# Patient Record
Sex: Female | Born: 1975
Health system: Southern US, Community
[De-identification: ages and names within clinical notes are randomized; demographics above are authoritative.]

## PROBLEM LIST (undated history)

## (undated) DIAGNOSIS — R002 Palpitations: Secondary | ICD-10-CM

## (undated) DIAGNOSIS — F329 Major depressive disorder, single episode, unspecified: Secondary | ICD-10-CM

## (undated) DIAGNOSIS — G252 Other specified forms of tremor: Secondary | ICD-10-CM

## (undated) DIAGNOSIS — K2970 Gastritis, unspecified, without bleeding: Secondary | ICD-10-CM

## (undated) DIAGNOSIS — F3289 Other specified depressive episodes: Secondary | ICD-10-CM

## (undated) DIAGNOSIS — L0292 Furuncle, unspecified: Secondary | ICD-10-CM

## (undated) DIAGNOSIS — R7303 Prediabetes: Secondary | ICD-10-CM

## (undated) DIAGNOSIS — I10 Essential (primary) hypertension: Secondary | ICD-10-CM

## (undated) DIAGNOSIS — M2550 Pain in unspecified joint: Secondary | ICD-10-CM

## (undated) DIAGNOSIS — M549 Dorsalgia, unspecified: Secondary | ICD-10-CM

## (undated) DIAGNOSIS — F341 Dysthymic disorder: Secondary | ICD-10-CM

## (undated) DIAGNOSIS — K219 Gastro-esophageal reflux disease without esophagitis: Secondary | ICD-10-CM

## (undated) DIAGNOSIS — E669 Obesity, unspecified: Secondary | ICD-10-CM

## (undated) DIAGNOSIS — R0602 Shortness of breath: Secondary | ICD-10-CM

## (undated) DIAGNOSIS — M722 Plantar fascial fibromatosis: Secondary | ICD-10-CM

## (undated) DIAGNOSIS — F419 Anxiety disorder, unspecified: Secondary | ICD-10-CM

## (undated) DIAGNOSIS — Z91018 Allergy to other foods: Secondary | ICD-10-CM

## (undated) DIAGNOSIS — M069 Rheumatoid arthritis, unspecified: Secondary | ICD-10-CM

## (undated) DIAGNOSIS — K5900 Constipation, unspecified: Secondary | ICD-10-CM

## (undated) DIAGNOSIS — M5481 Occipital neuralgia: Secondary | ICD-10-CM

## (undated) DIAGNOSIS — J309 Allergic rhinitis, unspecified: Secondary | ICD-10-CM

## (undated) DIAGNOSIS — L409 Psoriasis, unspecified: Secondary | ICD-10-CM

## (undated) DIAGNOSIS — S0300XA Dislocation of jaw, unspecified side, initial encounter: Secondary | ICD-10-CM

## (undated) DIAGNOSIS — K602 Anal fissure, unspecified: Secondary | ICD-10-CM

## (undated) DIAGNOSIS — T7840XA Allergy, unspecified, initial encounter: Secondary | ICD-10-CM

## (undated) DIAGNOSIS — E785 Hyperlipidemia, unspecified: Secondary | ICD-10-CM

## (undated) DIAGNOSIS — E7800 Pure hypercholesterolemia, unspecified: Secondary | ICD-10-CM

## (undated) HISTORY — DX: Palpitations: R00.2

## (undated) HISTORY — DX: Dorsalgia, unspecified: M54.9

## (undated) HISTORY — DX: Constipation, unspecified: K59.00

## (undated) HISTORY — DX: Allergy, unspecified, initial encounter: T78.40XA

## (undated) HISTORY — DX: Occipital neuralgia: M54.81

## (undated) HISTORY — DX: Dysthymic disorder: F34.1

## (undated) HISTORY — DX: Gastro-esophageal reflux disease without esophagitis: K21.9

## (undated) HISTORY — DX: Allergic rhinitis, unspecified: J30.9

## (undated) HISTORY — DX: Essential (primary) hypertension: I10

## (undated) HISTORY — DX: Gastritis, unspecified, without bleeding: K29.70

## (undated) HISTORY — DX: Major depressive disorder, single episode, unspecified: F32.9

## (undated) HISTORY — DX: Shortness of breath: R06.02

## (undated) HISTORY — DX: Furuncle, unspecified: L02.92

## (undated) HISTORY — DX: Plantar fascial fibromatosis: M72.2

## (undated) HISTORY — DX: Dislocation of jaw, unspecified side, initial encounter: S03.00XA

## (undated) HISTORY — DX: Pure hypercholesterolemia, unspecified: E78.00

## (undated) HISTORY — DX: Pain in unspecified joint: M25.50

## (undated) HISTORY — DX: Rheumatoid arthritis, unspecified: M06.9

## (undated) HISTORY — DX: Allergy to other foods: Z91.018

## (undated) HISTORY — DX: Psoriasis, unspecified: L40.9

## (undated) HISTORY — DX: Other specified depressive episodes: F32.89

## (undated) HISTORY — DX: Obesity, unspecified: E66.9

## (undated) HISTORY — DX: Anxiety disorder, unspecified: F41.9

## (undated) HISTORY — DX: Prediabetes: R73.03

## (undated) HISTORY — DX: Anal fissure, unspecified: K60.2

## (undated) HISTORY — PX: NO PAST SURGERIES: SHX2092

## (undated) HISTORY — DX: Other specified forms of tremor: G25.2

## (undated) HISTORY — DX: Hyperlipidemia, unspecified: E78.5

---

## 2001-05-11 LAB — HM COLONOSCOPY: HM Colonoscopy: NORMAL

## 2002-08-05 ENCOUNTER — Other Ambulatory Visit: Admission: RE | Admit: 2002-08-05 | Discharge: 2002-08-05 | Payer: Self-pay | Admitting: Obstetrics and Gynecology

## 2003-08-31 ENCOUNTER — Other Ambulatory Visit: Admission: RE | Admit: 2003-08-31 | Discharge: 2003-08-31 | Payer: Self-pay | Admitting: Obstetrics and Gynecology

## 2004-09-18 ENCOUNTER — Other Ambulatory Visit: Admission: RE | Admit: 2004-09-18 | Discharge: 2004-09-18 | Payer: Self-pay | Admitting: Obstetrics and Gynecology

## 2004-10-29 ENCOUNTER — Ambulatory Visit: Payer: Self-pay | Admitting: Adult Health

## 2004-11-14 ENCOUNTER — Ambulatory Visit: Payer: Self-pay | Admitting: Pulmonary Disease

## 2004-11-23 ENCOUNTER — Ambulatory Visit: Payer: Self-pay | Admitting: Pulmonary Disease

## 2004-12-28 ENCOUNTER — Ambulatory Visit: Payer: Self-pay | Admitting: Gastroenterology

## 2005-05-13 ENCOUNTER — Ambulatory Visit: Payer: Self-pay | Admitting: Pulmonary Disease

## 2005-07-25 ENCOUNTER — Ambulatory Visit: Payer: Self-pay | Admitting: Pulmonary Disease

## 2005-08-15 ENCOUNTER — Ambulatory Visit: Payer: Self-pay | Admitting: Cardiology

## 2005-08-23 ENCOUNTER — Ambulatory Visit: Payer: Self-pay | Admitting: Cardiology

## 2005-09-25 ENCOUNTER — Ambulatory Visit: Payer: Self-pay | Admitting: Cardiology

## 2005-10-22 ENCOUNTER — Ambulatory Visit: Payer: Self-pay | Admitting: Pulmonary Disease

## 2005-11-04 ENCOUNTER — Ambulatory Visit: Payer: Self-pay | Admitting: Pulmonary Disease

## 2005-11-19 ENCOUNTER — Other Ambulatory Visit: Admission: RE | Admit: 2005-11-19 | Discharge: 2005-11-19 | Payer: Self-pay | Admitting: Obstetrics and Gynecology

## 2006-04-14 ENCOUNTER — Emergency Department (HOSPITAL_COMMUNITY): Admission: EM | Admit: 2006-04-14 | Discharge: 2006-04-14 | Payer: Self-pay | Admitting: Emergency Medicine

## 2006-06-03 ENCOUNTER — Ambulatory Visit: Payer: Self-pay | Admitting: Pulmonary Disease

## 2007-08-28 ENCOUNTER — Ambulatory Visit: Payer: Self-pay | Admitting: Pulmonary Disease

## 2007-08-28 LAB — CONVERTED CEMR LAB
ALT: 23 units/L (ref 0–35)
AST: 21 units/L (ref 0–37)
Bilirubin Urine: NEGATIVE
Bilirubin, Direct: 0.1 mg/dL (ref 0.0–0.3)
CO2: 29 meq/L (ref 19–32)
Chloride: 105 meq/L (ref 96–112)
Direct LDL: 143.3 mg/dL
GFR calc non Af Amer: 104 mL/min
Glucose, Bld: 89 mg/dL (ref 70–99)
HCT: 38.5 % (ref 36.0–46.0)
Hemoglobin, Urine: NEGATIVE
Lymphocytes Relative: 23.8 % (ref 12.0–46.0)
MCHC: 34.9 g/dL (ref 30.0–36.0)
Neutro Abs: 5.4 10*3/uL (ref 1.4–7.7)
Neutrophils Relative %: 64.8 % (ref 43.0–77.0)
Nitrite: NEGATIVE
Platelets: 389 10*3/uL (ref 150–400)
Potassium: 4.6 meq/L (ref 3.5–5.1)
Sodium: 140 meq/L (ref 135–145)
Total Bilirubin: 0.5 mg/dL (ref 0.3–1.2)
Total CHOL/HDL Ratio: 4.8
Total Protein, Urine: NEGATIVE mg/dL
VLDL: 75 mg/dL — ABNORMAL HIGH (ref 0–40)

## 2008-07-27 ENCOUNTER — Telehealth: Payer: Self-pay | Admitting: Pulmonary Disease

## 2008-09-12 ENCOUNTER — Telehealth (INDEPENDENT_AMBULATORY_CARE_PROVIDER_SITE_OTHER): Payer: Self-pay | Admitting: *Deleted

## 2008-09-23 ENCOUNTER — Telehealth (INDEPENDENT_AMBULATORY_CARE_PROVIDER_SITE_OTHER): Payer: Self-pay | Admitting: *Deleted

## 2008-11-16 ENCOUNTER — Ambulatory Visit: Payer: Self-pay | Admitting: Pulmonary Disease

## 2008-11-16 DIAGNOSIS — R002 Palpitations: Secondary | ICD-10-CM | POA: Insufficient documentation

## 2008-11-16 DIAGNOSIS — E785 Hyperlipidemia, unspecified: Secondary | ICD-10-CM | POA: Insufficient documentation

## 2008-11-16 DIAGNOSIS — K5900 Constipation, unspecified: Secondary | ICD-10-CM | POA: Insufficient documentation

## 2008-11-16 DIAGNOSIS — J309 Allergic rhinitis, unspecified: Secondary | ICD-10-CM | POA: Insufficient documentation

## 2008-11-16 DIAGNOSIS — E669 Obesity, unspecified: Secondary | ICD-10-CM | POA: Insufficient documentation

## 2008-11-16 DIAGNOSIS — F341 Dysthymic disorder: Secondary | ICD-10-CM | POA: Insufficient documentation

## 2008-11-16 DIAGNOSIS — E781 Pure hyperglyceridemia: Secondary | ICD-10-CM | POA: Insufficient documentation

## 2008-11-16 DIAGNOSIS — K219 Gastro-esophageal reflux disease without esophagitis: Secondary | ICD-10-CM | POA: Insufficient documentation

## 2008-12-19 LAB — CONVERTED CEMR LAB: Pap Smear: NEGATIVE

## 2009-01-18 ENCOUNTER — Encounter: Payer: Self-pay | Admitting: Pulmonary Disease

## 2009-04-10 ENCOUNTER — Telehealth (INDEPENDENT_AMBULATORY_CARE_PROVIDER_SITE_OTHER): Payer: Self-pay | Admitting: *Deleted

## 2009-04-12 ENCOUNTER — Telehealth (INDEPENDENT_AMBULATORY_CARE_PROVIDER_SITE_OTHER): Payer: Self-pay | Admitting: *Deleted

## 2009-10-03 ENCOUNTER — Telehealth (INDEPENDENT_AMBULATORY_CARE_PROVIDER_SITE_OTHER): Payer: Self-pay | Admitting: *Deleted

## 2009-12-06 ENCOUNTER — Ambulatory Visit: Payer: Self-pay | Admitting: Internal Medicine

## 2009-12-06 LAB — CONVERTED CEMR LAB
AST: 18 units/L (ref 0–37)
Alkaline Phosphatase: 73 units/L (ref 39–117)
BUN: 10 mg/dL (ref 6–23)
Basophils Absolute: 0 10*3/uL (ref 0.0–0.1)
Basophils Relative: 0.3 % (ref 0.0–3.0)
Chloride: 109 meq/L (ref 96–112)
Eosinophils Absolute: 0.9 10*3/uL — ABNORMAL HIGH (ref 0.0–0.7)
Eosinophils Relative: 10.8 % — ABNORMAL HIGH (ref 0.0–5.0)
Hemoglobin, Urine: NEGATIVE
Lymphocytes Relative: 28.4 % (ref 12.0–46.0)
MCV: 85.2 fL (ref 78.0–100.0)
Monocytes Absolute: 0.6 10*3/uL (ref 0.1–1.0)
Monocytes Relative: 6.8 % (ref 3.0–12.0)
Neutro Abs: 4.3 10*3/uL (ref 1.4–7.7)
Neutrophils Relative %: 53.7 % (ref 43.0–77.0)
Nitrite: NEGATIVE
Platelets: 331 10*3/uL (ref 150.0–400.0)
RBC: 4.4 M/uL (ref 3.87–5.11)
RDW: 13 % (ref 11.5–14.6)
Sodium: 142 meq/L (ref 135–145)
Specific Gravity, Urine: 1.025 (ref 1.000–1.030)
Triglycerides: 355 mg/dL — ABNORMAL HIGH (ref 0.0–149.0)
Urine Glucose: NEGATIVE mg/dL
WBC: 8.1 10*3/uL (ref 4.5–10.5)

## 2009-12-11 ENCOUNTER — Ambulatory Visit: Payer: Self-pay | Admitting: Internal Medicine

## 2009-12-11 ENCOUNTER — Telehealth: Payer: Self-pay | Admitting: Internal Medicine

## 2009-12-11 DIAGNOSIS — F329 Major depressive disorder, single episode, unspecified: Secondary | ICD-10-CM | POA: Insufficient documentation

## 2009-12-11 DIAGNOSIS — F3289 Other specified depressive episodes: Secondary | ICD-10-CM | POA: Insufficient documentation

## 2009-12-11 DIAGNOSIS — D649 Anemia, unspecified: Secondary | ICD-10-CM | POA: Insufficient documentation

## 2009-12-12 ENCOUNTER — Encounter: Payer: Self-pay | Admitting: Internal Medicine

## 2010-01-16 ENCOUNTER — Encounter: Payer: Self-pay | Admitting: Internal Medicine

## 2010-03-20 ENCOUNTER — Ambulatory Visit: Payer: Self-pay | Admitting: Internal Medicine

## 2010-03-20 DIAGNOSIS — R197 Diarrhea, unspecified: Secondary | ICD-10-CM | POA: Insufficient documentation

## 2010-03-21 ENCOUNTER — Encounter: Payer: Self-pay | Admitting: Internal Medicine

## 2010-03-21 LAB — CONVERTED CEMR LAB
AST: 17 units/L (ref 0–37)
BUN: 10 mg/dL (ref 6–23)
CO2: 28 meq/L (ref 19–32)
Chloride: 106 meq/L (ref 96–112)
Eosinophils Absolute: 0.3 10*3/uL (ref 0.0–0.7)
Eosinophils Relative: 4 % (ref 0.0–5.0)
Lymphocytes Relative: 31.3 % (ref 12.0–46.0)
Lymphs Abs: 2.5 10*3/uL (ref 0.7–4.0)
MCV: 83.6 fL (ref 78.0–100.0)
Monocytes Absolute: 0.7 10*3/uL (ref 0.1–1.0)
Monocytes Relative: 9.1 % (ref 3.0–12.0)
Neutrophils Relative %: 55.1 % (ref 43.0–77.0)
Platelets: 383 10*3/uL (ref 150.0–400.0)
Potassium: 4.2 meq/L (ref 3.5–5.1)
TSH: 1.86 microintl units/mL (ref 0.35–5.50)
Total Protein: 7.3 g/dL (ref 6.0–8.3)

## 2010-06-07 ENCOUNTER — Ambulatory Visit: Payer: Self-pay | Admitting: Internal Medicine

## 2010-06-07 LAB — CONVERTED CEMR LAB
ALT: 14 units/L (ref 0–35)
HDL: 55.3 mg/dL (ref >39.00)
Total Bilirubin: 0.3 mg/dL (ref 0.3–1.2)
Total CHOL/HDL Ratio: 3
Total Protein: 7.8 g/dL (ref 6.0–8.3)

## 2010-06-11 ENCOUNTER — Ambulatory Visit: Payer: Self-pay | Admitting: Internal Medicine

## 2010-07-11 ENCOUNTER — Emergency Department (HOSPITAL_COMMUNITY): Admission: EM | Admit: 2010-07-11 | Discharge: 2010-07-11 | Payer: Self-pay | Admitting: Family Medicine

## 2010-08-10 ENCOUNTER — Telehealth: Payer: Self-pay | Admitting: Internal Medicine

## 2010-12-16 LAB — CONVERTED CEMR LAB
AST: 20 units/L (ref 0–37)
Albumin: 3.5 g/dL (ref 3.5–5.2)
Alkaline Phosphatase: 76 units/L (ref 39–117)
Basophils Relative: 0.4 % (ref 0.0–3.0)
Bilirubin, Direct: 0.1 mg/dL (ref 0.0–0.3)
CO2: 29 meq/L (ref 19–32)
Chloride: 106 meq/L (ref 96–112)
Crystals: NEGATIVE
Eosinophils Absolute: 0.5 10*3/uL (ref 0.0–0.7)
Glucose, Bld: 92 mg/dL (ref 70–99)
HCT: 38.5 % (ref 36.0–46.0)
HDL: 61.2 mg/dL (ref >39.0)
LDL Cholesterol: 102 mg/dL — ABNORMAL HIGH (ref 0–99)
Leukocytes, UA: NEGATIVE
Lymphocytes Relative: 28.5 % (ref 12.0–46.0)
MCHC: 35.4 g/dL (ref 30.0–36.0)
Neutro Abs: 4.6 10*3/uL (ref 1.4–7.7)
Potassium: 4.6 meq/L (ref 3.5–5.1)
RBC: 4.62 M/uL (ref 3.87–5.11)
TSH: 0.68 microintl units/mL (ref 0.35–5.50)
Total Protein, Urine: NEGATIVE mg/dL
Total Protein: 7.8 g/dL (ref 6.0–8.3)
Triglycerides: 163 mg/dL — ABNORMAL HIGH (ref 0–149)
Urine Glucose: NEGATIVE mg/dL
VLDL: 33 mg/dL (ref 0–40)

## 2010-12-17 ENCOUNTER — Ambulatory Visit: Admit: 2010-12-17 | Payer: Self-pay | Admitting: Internal Medicine

## 2010-12-18 NOTE — Medication Information (Signed)
Summary: Request Lexapro/[facility]  Request Lexapro/[facility]   Imported By: Lester New Era 12/13/2009 17:07:14  _____________________________________________________________________  External Attachment:    Type:   Image     Comment:   External Document

## 2010-12-18 NOTE — Medication Information (Signed)
Summary: Approval LEXAPRO til 12/11/10/[facility]  Approval LEXAPRO til 12/11/10/[facility]   Imported By: Lester Bullard 12/15/2009 14:58:25  _____________________________________________________________________  External Attachment:    Type:   Image     Comment:   External Document

## 2010-12-18 NOTE — Progress Notes (Signed)
Summary: Lexapro renewal  Phone Note From Pharmacy   Caller: [facility] [phone] Call For: CASE:  [id number]  Summary of Call: Rec'd fax from [facility] that patient Lexapro is about to expire on 01-12-10. They will fax the form. Initial call taken by: Lucious Groves,  December 11, 2009 10:05 AM  Follow-up for Phone Call        form rec'd, signed, and faxed to [facility]. Follow-up by: Lucious Groves,  December 11, 2009 3:50 PM    Additional Follow-up for Phone Call Additional follow up Details #2::     Called automated system to check and status, and is approved until 12/11/2010. Follow-up by: Lucious Groves,  December 12, 2009 2:39 PM

## 2010-12-18 NOTE — Assessment & Plan Note (Signed)
Summary: diarrhea x 3 wks--today/bloody---stc   Vital Signs:  Patient profile:   35 year old female Height:      68 inches (172.72 cm) Weight:      248.0 pounds (112.73 kg) O2 Sat:      96 % on Room air Temp:     99.2 degrees F (37.33 degrees C) oral Pulse rate:   87 / minute BP sitting:   124 / 82  (left arm) Cuff size:   regular  Vitals Entered By: Orlan Leavens (Mar 20, 2010 3:57 PM)  O2 Flow:  Room air CC: diarrhea x's 3 weeks. Pt states today she notice some blood, Diarrhea Is Patient Diabetic? No Pain Assessment Patient in pain? no        Primary Care Provider:  Newt Lukes MD  CC:  diarrhea x's 3 weeks. Pt states today she notice some blood and Diarrhea.  History of Present Illness:  Diarrhea      This is a 35 year old woman who presents with Diarrhea.  The symptoms began 3 weeks ago.  The severity is described as moderate.  usually constipated.  The patient reports 4-6 stools per day, semiformed/loose stools, blood in stool, mucus in stool, malodorous stools, fecal urgency, nocturnal diarrhea, fasting diarrhea, gassiness, and abrupt onset of symptoms, but denies fecal soiling, alternating diarrhea/constipation, and gradual onset of symptoms.  Associated symptoms include abdominal cramps, nausea, and lightheadedness.  The patient denies fever, abdominal pain, vomiting, weight loss, joint pains, and eye redness.  The symptoms are better with probiotics x last 3 days.  Patient denies following risk factors: laxative use, recent antibiotic use, recent hospitalization, sick contact, eating suspicious food, and international travel.  Patient has no history of irritable bowel syndrome.    Clinical Review Panels:  CBC   WBC:  8.1 (12/06/2009)   RBC:  4.40 (12/06/2009)   Hgb:  12.6 (12/06/2009)   Hct:  37.5 (12/06/2009)   Platelets:  331.0 (12/06/2009)   MCV  85.2 (12/06/2009)   MCHC  33.7 (12/06/2009)   RDW  13.0 (12/06/2009)   PMN:  53.7 (12/06/2009)   Lymphs:   28.4 (12/06/2009)   Monos:  6.8 (12/06/2009)   Eosinophils:  10.8 (12/06/2009)   Basophil:  0.3 (12/06/2009)  Complete Metabolic Panel   Glucose:  82 (12/06/2009)   Sodium:  142 (12/06/2009)   Potassium:  4.1 (12/06/2009)   Chloride:  109 (12/06/2009)   CO2:  27 (12/06/2009)   BUN:  10 (12/06/2009)   Creatinine:  0.7 (12/06/2009)   Albumin:  3.3 (12/06/2009)   Total Protein:  7.4 (12/06/2009)   Calcium:  9.3 (12/06/2009)   Total Bili:  0.6 (12/06/2009)   Alk Phos:  73 (12/06/2009)   SGPT (ALT):  17 (12/06/2009)   SGOT (AST):  18 (12/06/2009)   Current Medications (verified): 1)  Flonase 50 Mcg/act Susp (Fluticasone Propionate) .... 2 Sprays in Each Nostril Two Times A Day Prn 2)  Proair Hfa 108 (90 Base) Mcg/act Aers (Albuterol Sulfate) .Marland Kitchen.. 1-2 Inhalations Every 4 H As Directed... 3)  Simvastatin 40 Mg Tabs (Simvastatin) .... Take 1 Tablet By Mouth Once A Day 4)  Nexium 40 Mg Pack (Esomeprazole Magnesium) .... Take 1 Tablet By Mouth Once A Day 5)  Lexapro 20 Mg Tabs (Escitalopram Oxalate) .Marland Kitchen.. 1 By Mouth Once Daily 6)  Patanol 0.1 % Soln (Olopatadine Hcl) .Marland Kitchen.. 1 Drop in Each Eye Two Times A Day As Needed 7)  Singulair 10 Mg Tabs (Montelukast Sodium) .Marland KitchenMarland KitchenMarland Kitchen  Take 1 Tablet By Mouth Once A Day 8)  Nuvaring 0.12-0.015 Mg/24hr Ring (Etonogestrel-Ethinyl Estradiol) .... Use As Directed 9)  Vitamin D 1000 Unit Tabs (Cholecalciferol) .... Take 1 By Mouth Qd 10)  Zyrtec Hives Relief 10 Mg Tabs (Cetirizine Hcl) .... Take 1 By Mouth Qd 11)  Multivitamins  Tabs (Multiple Vitamin) .... Take 1 By Mouth Once Daily 12)  Fenofibrate Micronized 67 Mg Caps (Fenofibrate Micronized) .Marland Kitchen.. 1 By Mouth Once Daily  Allergies (verified): No Known Drug Allergies  Past History:  Past Medical History: ALLERGIC RHINITIS Hx of PALPITATIONS  HYPERCHOLESTEROLEMIA  OBESITY  GASTROESOPHAGEAL REFLUX DISEASE ANXIETY DEPRESSION Anemia  MD rooster: gyn-Lavoie optho-Parker @ [facility] -  [facility]ott  Review of Systems  The patient denies anorexia, weight loss, weight gain, chest pain, syncope, headaches, melena, and severe indigestion/heartburn.    Physical Exam  General:  overweight-appearing.  alert, well-developed, well-nourished, and cooperative to examination. nontoxic -  spouse initially at side Eyes:  vision grossly intact; pupils equal, round and reactive to light.  conjunctiva and lids normal.    Lungs:  normal respiratory effort, no intercostal retractions or use of accessory muscles; normal breath sounds bilaterally - no crackles and no wheezes.    Heart:  normal rate, regular rhythm, no murmur, and no rub. BLE without edema.  Abdomen:  soft, non-tender, normal bowel sounds, no distention; no masses and no appreciable hepatomegaly or splenomegaly.   Rectal:  scant mucoid stool, FOB trace (+) - no melena or BRBPR - nontender, no hemorrhoids Skin:  no rashes, vesicles, ulcers, or erythema. No nodules or irregularity to palpation.    Impression & Recommendations:  Problem # 1:  DIARRHEA (ICD-787.91) cont probiotic - check stool studies - emperic flagyl given hospital exposures - no fever or abd tenderness - consider need for GI eval depending on symptoms and these results - Her updated medication list for this problem includes:    [facility] Colon Health Caps (Probiotic product) .Marland Kitchen... 1 by mouth once daily  Orders: TLB-BMP (Basic Metabolic Panel-BMET) (80048-METABOL) TLB-CBC Platelet - w/Differential (85025-CBCD) TLB-Hepatic/Liver Function Pnl (80076-HEPATIC) TLB-TSH (Thyroid Stimulating Hormone) (84443-TSH) T-Culture, Stool (87045/[id number]) T-Culture, C-Diff Toxin A/B (16109-60454) T-Stool for O&P (09811-91478) T-Stool Giardia / Crypto- EIA (29562) Prescription Created Electronically [phone])  Complete Medication List: 1)  Flonase 50 Mcg/act Susp (Fluticasone propionate) .... 2 sprays in each nostril two times a day prn 2)  Proair Hfa 108 (90 Base) Mcg/act  Aers (Albuterol sulfate) .Marland Kitchen.. 1-2 inhalations every 4 h as directed...prn 3)  Simvastatin 40 Mg Tabs (Simvastatin) .... Take 1 tablet by mouth once a day 4)  Nexium 40 Mg Pack (Esomeprazole magnesium) .... Take 1 tablet by mouth once a day 5)  Lexapro 20 Mg Tabs (Escitalopram oxalate) .Marland Kitchen.. 1 by mouth once daily 6)  Patanol 0.1 % Soln (Olopatadine hcl) .Marland Kitchen.. 1 drop in each eye two times a day as needed 7)  Singulair 10 Mg Tabs (Montelukast sodium) .... Take 1 tablet by mouth once a day 8)  Nuvaring 0.12-0.015 Mg/24hr Ring (Etonogestrel-ethinyl estradiol) .... Use as directed 9)  Vitamin D 1000 Unit Tabs (Cholecalciferol) .... Take 1 by mouth qd 10)  Zyrtec Hives Relief 10 Mg Tabs (Cetirizine hcl) .... Take 1 by mouth qd 11)  Multivitamins Tabs (Multiple vitamin) .... Take 1 by mouth once daily 12)  Fenofibrate Micronized 67 Mg Caps (Fenofibrate micronized) .Marland Kitchen.. 1 by mouth once daily 13)  Phillips Colon Health Caps (Probiotic product) .Marland Kitchen.. 1 by mouth once daily 14)  Metronidazole 500 Mg Tabs (Metronidazole) .Marland Kitchen.. 1 by mouth three times a day x 7 days  Patient Instructions: 1)  it was good to see you today. 2)  test(s) ordered today - your results will be posted on the phone tree for review in 48-72 hours from the time of test completion; call [phone] and enter your 9 digit MRN (listed above on this page, just below your name); if any changes need to be made or there are abnormal results, you will be contacted directly.  3)  continue the colon health probiotics daily and start emperic Flagyl x 7 days  4)  your prescription has been electronically submitted to your pharmacy. Please take as directed. Contact our office if you believe you're having problems with the medication(s).  5)  Please keep follow-up appointment as scheduled, sooner if problems.  Prescriptions: METRONIDAZOLE 500 MG TABS (METRONIDAZOLE) 1 by mouth three times a day x 7 days  #21 x 0   Entered and Authorized by:   Newt Lukes MD   Signed by:   Newt Lukes MD on 03/20/2010   Method used:   Electronically to        [facility]* (retail)       [address]       [address] Shipping/mailing       [place], [place]  [address]       Ph: [phone]       Fax: [phone]   RxID:   [id number]

## 2010-12-18 NOTE — Assessment & Plan Note (Signed)
Summary: 6 MO ROV /NWS #   Vital Signs:  Patient profile:   35 year old female Height:      68 inches (172.72 cm) Weight:      252.0 pounds (114.55 kg) O2 Sat:      98 % on Room air Temp:     98.9 degrees F (37.17 degrees C) oral Pulse rate:   102 / minute BP sitting:   130 / 82  (left arm) Cuff size:   large  Vitals Entered By: Orlan Leavens RMA (June 11, 2010 8:32 AM)  O2 Flow:  Room air CC: 6 month follow-up Is Patient Diabetic? No Pain Assessment Patient in pain? no        Primary Care Provider:  Newt Lukes MD  CC:  6 month follow-up.  History of Present Illness: depression hx - on lexapro - symptoms well controlled on same - has been on same meds x last 8 yrs prev tried celexa - ineffective control of symptoms   dyslipidemia - ++FH same - reports compliance with ongoing medical treatment and no changes in medication dose or frequency. denies adverse side effects related to current therapy. no muscle or GI problems  GERD - reports compliance with ongoing medical treatment and no changes in medication dose or frequency. denies adverse side effects related to current therapy. no abd pain, n/v or change bms   Current Medications (verified): 1)  Flonase 50 Mcg/act Susp (Fluticasone Propionate) .... 2 Sprays in Each Nostril Two Times A Day Prn 2)  Proair Hfa 108 (90 Base) Mcg/act Aers (Albuterol Sulfate) .Marland Kitchen.. 1-2 Inhalations Every 4 H As Directed...prn 3)  Simvastatin 40 Mg Tabs (Simvastatin) .... Take 1 Tablet By Mouth Once A Day 4)  Nexium 40 Mg Pack (Esomeprazole Magnesium) .... Take 1 Tablet By Mouth Once A Day 5)  Lexapro 20 Mg Tabs (Escitalopram Oxalate) .Marland Kitchen.. 1 By Mouth Once Daily 6)  Patanol 0.1 % Soln (Olopatadine Hcl) .Marland Kitchen.. 1 Drop in Each Eye Two Times A Day As Needed 7)  Singulair 10 Mg Tabs (Montelukast Sodium) .... Take 1 Tablet By Mouth Once A Day 8)  Nuvaring 0.12-0.015 Mg/24hr Ring (Etonogestrel-Ethinyl Estradiol) .... Use As Directed 9)  Zyrtec  Hives Relief 10 Mg Tabs (Cetirizine Hcl) .... Take 1 By Mouth Qd 10)  Multivitamins  Tabs (Multiple Vitamin) .... Take 1 By Mouth Once Daily 11)  Fenofibrate Micronized 67 Mg Caps (Fenofibrate Micronized) .Marland Kitchen.. 1 By Mouth Once Daily  Allergies (verified): No Known Drug Allergies  Past History:  Past Medical History: ALLERGIC RHINITIS Hx of PALPITATIONS  HYPERCHOLESTEROLEMIA  OBESITY  GASTROESOPHAGEAL REFLUX DISEASE ANXIETY DEPRESSION Anemia  MD roster: gyn-Lavoie optho-Parker @ [facility] - szott  Review of Systems  The patient denies weight loss, chest pain, syncope, and abdominal pain.    Physical Exam  General:  alert, well-developed, well-nourished, and cooperative to examination.   overweight-appearing.   Lungs:  normal respiratory effort, no intercostal retractions or use of accessory muscles; normal breath sounds bilaterally - no crackles and no wheezes.    Heart:  normal rate, regular rhythm, no murmur, and no rub. BLE without edema.  Psych:  Oriented X3, memory intact for recent and remote, normally interactive, good eye contact, not anxious appearing, not depressed appearing, and not agitated.      Impression & Recommendations:  Problem # 1:  HYPERCHOLESTEROLEMIA (ICD-272.0)  Her updated medication list for this problem includes:    Simvastatin 40 Mg Tabs (Simvastatin) .Marland Kitchen... Take 1  tablet by mouth once a day    Fenofibrate Micronized 67 Mg Caps (Fenofibrate micronized) .Marland Kitchen... 1 by mouth once daily  FLP shows isolated inc in TG - cont statin and fenofibrate to acheive better control- also reviewed need for weight control/loss and increase in physical exercise - pt understands and agrees  Problem # 2:  DEPRESSION (ICD-311)  Her updated medication list for this problem includes:    Lexapro 20 Mg Tabs (Escitalopram oxalate) .Marland Kitchen... 1 by mouth once daily  well controlled -  Complete Medication List: 1)  Flonase 50 Mcg/act Susp (Fluticasone propionate)  .... 2 sprays in each nostril two times a day prn 2)  Proair Hfa 108 (90 Base) Mcg/act Aers (Albuterol sulfate) .Marland Kitchen.. 1-2 inhalations every 4 h as directed...prn 3)  Simvastatin 40 Mg Tabs (Simvastatin) .... Take 1 tablet by mouth once a day 4)  Nexium 40 Mg Pack (Esomeprazole magnesium) .... Take 1 tablet by mouth once a day 5)  Lexapro 20 Mg Tabs (Escitalopram oxalate) .Marland Kitchen.. 1 by mouth once daily 6)  Patanol 0.1 % Soln (Olopatadine hcl) .Marland Kitchen.. 1 drop in each eye two times a day as needed 7)  Singulair 10 Mg Tabs (Montelukast sodium) .... Take 1 tablet by mouth once a day 8)  Nuvaring 0.12-0.015 Mg/24hr Ring (Etonogestrel-ethinyl estradiol) .... Use as directed 9)  Zyrtec Hives Relief 10 Mg Tabs (Cetirizine hcl) .... Take 1 by mouth qd 10)  Multivitamins Tabs (Multiple vitamin) .... Take 1 by mouth once daily 11)  Fenofibrate Micronized 67 Mg Caps (Fenofibrate micronized) .Marland Kitchen.. 1 by mouth once daily  Patient Instructions: 1)  it was good to see you today. 2)  Please schedule a follow-up appointment in mid Jan 2012 for your physical and labs, sooner if problems.

## 2010-12-18 NOTE — Assessment & Plan Note (Signed)
Summary: NEW / UMR / # / CD   Vital Signs:  Patient profile:   35 year old female Height:      68 inches (172.72 cm) Weight:      245.6 pounds (111.64 kg) BMI:     37.48 O2 Sat:      95 % on Room air Temp:     98.4 degrees F (36.89 degrees C) oral Pulse rate:   96 / minute BP sitting:   132 / 68  (left arm) Cuff size:   large  Vitals Entered By: Orlan Leavens (December 11, 2009 8:12 AM)  O2 Flow:  Room air CC: New patient Is Patient Diabetic? No Pain Assessment Patient in pain? no        Primary Care Provider:  Newt Lukes MD  CC:  New patient.  History of Present Illness: new pt to me and our division - here to est care also, patient is here today for annual physical. Patient feels well and has no complaints.   request prior auth completion on lexapro - symptoms well controlled on same - has been on same meds x last 8 yrs prev tried celexa - ineffective  dyslipidemia - ++FH same - reports compliance with ongoing medical treatment and no changes in medication dose or frequency. denies adverse side effects related to current therapy.   Preventive Screening-Counseling & Management  Alcohol-Tobacco     Alcohol drinks/day: 0     Alcohol Counseling: not indicated; patient does not drink     Smoking Status: never     Tobacco Counseling: not indicated; no tobacco use  Caffeine-Diet-Exercise     Does Patient Exercise: no     Exercise Counseling: to improve exercise regimen  Safety-Violence-Falls     Seat Belt Use: yes     Helmet Use: yes     Firearms in the Home: firearms in the home     Firearm Counseling: not indicated; uses recommended firearm safety measures     Smoke Detectors: yes     Violence in the Home: no risk noted  Clinical Review Panels:  Prevention   Last Pap Smear:  Interpretation/Result:Negative for intraepithelial Lesion or Malignancy.    (12/19/2008)  Lipid Management   Cholesterol:  168 (12/06/2009)   LDL (bad choesterol):  102  (11/16/2008)   HDL (good cholesterol):  46.60 (12/06/2009)  CBC   WBC:  8.1 (12/06/2009)   RBC:  4.40 (12/06/2009)   Hgb:  12.6 (12/06/2009)   Hct:  37.5 (12/06/2009)   Platelets:  331.0 (12/06/2009)   MCV  85.2 (12/06/2009)   MCHC  33.7 (12/06/2009)   RDW  13.0 (12/06/2009)   PMN:  53.7 (12/06/2009)   Lymphs:  28.4 (12/06/2009)   Monos:  6.8 (12/06/2009)   Eosinophils:  10.8 (12/06/2009)   Basophil:  0.3 (12/06/2009)  Complete Metabolic Panel   Glucose:  82 (12/06/2009)   Sodium:  142 (12/06/2009)   Potassium:  4.1 (12/06/2009)   Chloride:  109 (12/06/2009)   CO2:  27 (12/06/2009)   BUN:  10 (12/06/2009)   Creatinine:  0.7 (12/06/2009)   Albumin:  3.3 (12/06/2009)   Total Protein:  7.4 (12/06/2009)   Calcium:  9.3 (12/06/2009)   Total Bili:  0.6 (12/06/2009)   Alk Phos:  73 (12/06/2009)   SGPT (ALT):  17 (12/06/2009)   SGOT (AST):  18 (12/06/2009)   Current Medications (verified): 1)  Flonase 50 Mcg/act Susp (Fluticasone Propionate) .... 2 Sprays in Each Nostril Two Times A  Day 2)  Proair Hfa 108 (90 Base) Mcg/act Aers (Albuterol Sulfate) .Marland Kitchen.. 1-2 Inhalations Every 4 H As Directed... 3)  Simvastatin 40 Mg Tabs (Simvastatin) .... Take 1 Tablet By Mouth Once A Day 4)  Nexium 40 Mg Pack (Esomeprazole Magnesium) .... Take 1 Tablet By Mouth Once A Day 5)  Lexapro 20 Mg Tabs (Escitalopram Oxalate) .Marland Kitchen.. 1 By Mouth Once Daily 6)  Patanol 0.1 % Soln (Olopatadine Hcl) .Marland Kitchen.. 1 Drop in Each Eye Two Times A Day As Needed 7)  Singulair 10 Mg Tabs (Montelukast Sodium) .... Take 1 Tablet By Mouth Once A Day 8)  Nuvaring 0.12-0.015 Mg/24hr Ring (Etonogestrel-Ethinyl Estradiol) .... Use As Directed 9)  Vitamin D 1000 Unit Tabs (Cholecalciferol) .... Take 1 By Mouth Qd 10)  Zyrtec Hives Relief 10 Mg Tabs (Cetirizine Hcl) .... Take 1 By Mouth Qd 11)  Multivitamins  Tabs (Multiple Vitamin) .... Take 1 By Mouth Once Daily  Allergies (verified): No Known Drug Allergies  Past  History:  Past Medical History: ALLERGIC RHINITIS Hx of PALPITATIONS  HYPERCHOLESTEROLEMIA  OBESITY  GASTROESOPHAGEAL REFLUX DISEASE ANXIETY DEPRESSION  MD rooster: gyn-[facility] @ [facility] - [place]  Past Surgical History: none  Family History: Father is alive age 7 w/ hx chol; also smoker Mother is alive age 94 w/ hx of HBP, DM, Chol, and breast cancer; also obesity and OA 1Sibling- Brother with chol  Social History: Married no children never smoked  social alcohol Employed - [employer]Does Patient 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 I have reviewed all other systems and they were negative.   Physical Exam  General:  overweight-appearing.  alert, well-developed, well-nourished, and cooperative to examination.   spouse at side Eyes:  vision grossly intact; pupils equal, round and reactive to light.  conjunctiva and lids normal.    Ears:  normal pinnae bilaterally, without erythema, swelling, or tenderness to palpation. TMs clear, without effusion, or cerumen impaction. Hearing grossly normal bilaterally  Mouth:  teeth and gums in good repair; mucous membranes moist, without lesions or ulcers. oropharynx clear without exudate, no erythema. prominent tonsils Lungs:  normal respiratory effort, no intercostal retractions or use of accessory muscles; normal breath sounds bilaterally - no crackles and no wheezes.    Heart:  normal rate, regular rhythm, no murmur, and no rub. BLE without edema.  Abdomen:  soft, non-tender, normal bowel sounds, no distention; no masses and no appreciable hepatomegaly or splenomegaly.   Genitalia:  defer to gyn Msk:  No deformity or scoliosis noted of thoracic or lumbar spine.   Neurologic:  alert & oriented X3 and cranial nerves II-XII symetrically intact.  strength normal in all extremities, sensation intact to  light touch, and gait normal. speech fluent without dysarthria or aphasia; follows commands with good comprehension.  Skin:  no rashes, vesicles, ulcers, or erythema. No nodules or irregularity to palpation.  Psych:  Oriented X3, memory intact for recent and remote, normally interactive, good eye contact, not anxious appearing, not depressed appearing, and not agitated.      Impression & Recommendations:  Problem # 1:  PHYSICAL EXAMINATION (ICD-V70.0) Patient has been counseled on age-appropriate routine health concerns for screening and prevention. These are reviewed and up-to-date. Immunizations are up-to-date or declined. Labs reviewed.   Problem # 2:  HYPERCHOLESTEROLEMIA (ICD-272.0)  FLP shows isolated inc in  TG - cont statin and add fenofibrate to acheive better control given upward trend of same- also reviewed need for weight control/loss and increase in physical exercise - pt understands and agrees Her updated medication list for this problem includes:    Simvastatin 40 Mg Tabs (Simvastatin) .Marland Kitchen... Take 1 tablet by mouth once a day    Fenofibrate Micronized 67 Mg Caps (Fenofibrate micronized) .Marland Kitchen... 1 by mouth once daily  Orders: Prescription Created Electronically [phone])  Problem # 3:  DEPRESSION (ICD-311) well controlled - will work on prior auth as requested Her updated medication list for this problem includes:    Lexapro 20 Mg Tabs (Escitalopram oxalate) .Marland Kitchen... 1 by mouth once daily  Problem # 4:  GERD (ICD-530.81) chronic problem - onset in 1st grade per pt remote endo (?2002) without ulcers or problems-  no change symptoms since that time - cont same Her updated medication list for this problem includes:    Nexium 40 Mg Pack (Esomeprazole magnesium) .Marland Kitchen... Take 1 tablet by mouth once a day  Problem # 5:  ALLERGIC RHINITIS (ICD-477.9) discussed removal of allergens (cat) - not ready to do so at this time cont antihistamine, nasal steroids (encouraged regular use, not  only as needed ), singular and occ Alb MDI for same Her updated medication list for this problem includes:    Flonase 50 Mcg/act Susp (Fluticasone propionate) .Marland Kitchen... 2 sprays in each nostril two times a day    Zyrtec Hives Relief 10 Mg Tabs (Cetirizine hcl) .Marland Kitchen... Take 1 by mouth qd  Complete Medication List: 1)  Flonase 50 Mcg/act Susp (Fluticasone propionate) .... 2 sprays in each nostril two times a day 2)  Proair Hfa 108 (90 Base) Mcg/act Aers (Albuterol sulfate) .Marland Kitchen.. 1-2 inhalations every 4 h as directed... 3)  Simvastatin 40 Mg Tabs (Simvastatin) .... Take 1 tablet by mouth once a day 4)  Nexium 40 Mg Pack (Esomeprazole magnesium) .... Take 1 tablet by mouth once a day 5)  Lexapro 20 Mg Tabs (Escitalopram oxalate) .Marland Kitchen.. 1 by mouth once daily 6)  Patanol 0.1 % Soln (Olopatadine hcl) .Marland Kitchen.. 1 drop in each eye two times a day as needed 7)  Singulair 10 Mg Tabs (Montelukast sodium) .... Take 1 tablet by mouth once a day 8)  Nuvaring 0.12-0.015 Mg/24hr Ring (Etonogestrel-ethinyl estradiol) .... Use as directed 9)  Vitamin D 1000 Unit Tabs (Cholecalciferol) .... Take 1 by mouth qd 10)  Zyrtec Hives Relief 10 Mg Tabs (Cetirizine hcl) .... Take 1 by mouth qd 11)  Multivitamins Tabs (Multiple vitamin) .... Take 1 by mouth once daily 12)  Fenofibrate Micronized 67 Mg Caps (Fenofibrate micronized) .Marland Kitchen.. 1 by mouth once daily  Patient Instructions: 1)  it was good to see you today.  2)  exam looks good, labs reviewed - 3)  will start fenofibrate as discussed for high triglycerides - your prescriptions have been electronically submitted to your pharmacy. Please take as directed. Contact our office if you believe you're having problems with the medication.  4)  we'll work on the pre authorization for the lexapro 5)  Please schedule a follow-up appointment in 6 months, sooner if problems.  6)  Hepatic Panel prior to visit, ICD-9:v58.69 7)  Lipid Panel prior to visit, ICD-9:272.4 8)  it is important that  you work on losing weight - monitor your diet and consume fewer calories such as less carbohydrates (sugar) and less fat. you also need to increase your physical activity level - start by walking for  10-20 minutes 3 times per week and work up to 30 minutes 4-5 times each week.  Prescriptions: FENOFIBRATE MICRONIZED 67 MG CAPS (FENOFIBRATE MICRONIZED) 1 by mouth once daily  #90 x 3   Entered by:   Orlan Leavens   Authorized by:   Newt Lukes MD   Signed by:   Orlan Leavens on 12/11/2009   Method used:   Electronically to        [facility]* (retail)       [address]       [address] Shipping/mailing       [place], [place]  [address]       Ph: [phone]       Fax: [phone]   RxID:   [id number] SINGULAIR 10 MG TABS (MONTELUKAST SODIUM) Take 1 tablet by mouth once a day  #90 x 3   Entered by:   Orlan Leavens   Authorized by:   Newt Lukes MD   Signed by:   Orlan Leavens on 12/11/2009   Method used:   Electronically to        [facility]* (retail)       [address]       [address] Shipping/mailing       [place], [place]  [address]       Ph: [phone]       Fax: [phone]   RxID:   [id number] PATANOL 0.1 % SOLN (OLOPATADINE HCL) 1 drop in each eye two times a day as needed  #3 x 3   Entered by:   Orlan Leavens   Authorized by:   Newt Lukes MD   Signed by:   Orlan Leavens on 12/11/2009   Method used:   Electronically to        [facility]* (retail)       [address]       [address] Shipping/mailing       [place], [place]  [address]       Ph: [phone]       Fax: [phone]   RxID:   [id number] LEXAPRO 20 MG TABS (ESCITALOPRAM OXALATE) 1 by mouth once daily  #90 x 3   Entered by:   Orlan Leavens   Authorized by:   Newt Lukes MD   Signed by:   Orlan Leavens on 12/11/2009   Method used:   Electronically to        [facility]* (retail)       [address]       [address] Shipping/mailing       [place], [place]  [address]       Ph: [phone]       Fax: [phone]   RxID:   [id number] NEXIUM 40 MG PACK (ESOMEPRAZOLE MAGNESIUM) Take 1 tablet by mouth once a day  #90 x 3   Entered by:   Orlan Leavens   Authorized by:   Newt Lukes MD   Signed by:   Orlan Leavens on 12/11/2009   Method used:   Electronically to        [facility]* (retail)       [address]       [address] Shipping/mailing       [place], [place]  [address]       Ph: [phone]  Fax: [phone]   RxID:   [id number] SIMVASTATIN 40 MG TABS (SIMVASTATIN) Take 1 tablet by mouth once a day  #90 x 3   Entered by:   Orlan Leavens   Authorized by:   Newt Lukes MD   Signed by:   Orlan Leavens on 12/11/2009   Method used:   Electronically to        [facility]* (retail)       [address]       [address] Shipping/mailing       [place], [place]  [address]       Ph: [phone]       Fax: [phone]   RxID:   [id number] PROAIR HFA 108 (90 BASE) MCG/ACT AERS (ALBUTEROL SULFATE) 1-2 inhalations every 4 H as directed...  #3 x 3   Entered by:   Orlan Leavens   Authorized by:   Newt Lukes MD   Signed by:   Orlan Leavens on 12/11/2009   Method used:   Electronically to        [facility]* (retail)       [address]       [address] Shipping/mailing       [place], [place]  [address]       Ph: [phone]       Fax: [phone]   RxID:   [id number] FLONASE 50 MCG/ACT SUSP (FLUTICASONE PROPIONATE) 2 sprays in each nostril two times a day  #3 x 3   Entered by:   Orlan Leavens   Authorized by:   Newt Lukes MD   Signed by:   Orlan Leavens on 12/11/2009   Method used:   Electronically to        [facility]* (retail)       [address]       [address] Shipping/mailing       [place], [place]  [address]       Ph: [phone]       Fax: [phone]    RxID:   [id number] FENOFIBRATE MICRONIZED 67 MG CAPS (FENOFIBRATE MICRONIZED) 1 by mouth once daily  #30 x 6   Entered and Authorized by:   Newt Lukes MD   Signed by:   Newt Lukes MD on 12/11/2009   Method used:   Electronically to        [facility]* (retail)       [address]       [address] Shipping/mailing       [place], [place]  [address]       Ph: [phone]       Fax: [phone]   RxID:   [id number]    Pap Smear  Procedure date:  12/19/2008  Findings:      Interpretation/Result:Negative for intraepithelial Lesion or Malignancy.

## 2010-12-18 NOTE — Progress Notes (Signed)
Summary: singular  Phone Note Refill Request Message from:  Fax from Pharmacy on August 10, 2010 2:24 PM  Refills Requested: Medication #1:  SINGULAIR 10 MG TABS Take 1 tablet by mouth once a day [facility]   Method Requested: Electronic Initial call taken by: Orlan Leavens RMA,  August 10, 2010 2:24 PM    Prescriptions: SINGULAIR 10 MG TABS (MONTELUKAST SODIUM) Take 1 tablet by mouth once a day  #90 x 1   Entered by:   Orlan Leavens RMA   Authorized by:   Newt Lukes MD   Signed by:   Orlan Leavens RMA on 08/10/2010   Method used:   Electronically to        [facility]* (retail)       [address]       [address] Shipping/mailing       [place], [place]  [address]       Ph: [phone]       Fax: [phone]   RxID:   [id number]

## 2010-12-19 ENCOUNTER — Other Ambulatory Visit: Payer: Self-pay

## 2010-12-19 ENCOUNTER — Encounter (INDEPENDENT_AMBULATORY_CARE_PROVIDER_SITE_OTHER): Payer: Self-pay | Admitting: *Deleted

## 2010-12-19 ENCOUNTER — Other Ambulatory Visit: Payer: Self-pay | Admitting: Internal Medicine

## 2010-12-19 DIAGNOSIS — Z0000 Encounter for general adult medical examination without abnormal findings: Secondary | ICD-10-CM

## 2010-12-19 LAB — URINALYSIS
Bilirubin Urine: NEGATIVE
Hgb urine dipstick: NEGATIVE
Ketones, ur: NEGATIVE
Leukocytes, UA: NEGATIVE
Nitrite: NEGATIVE
Specific Gravity, Urine: 1.02
Total Protein, Urine: NEGATIVE
Urine Glucose: NEGATIVE
Urobilinogen, UA: 0.2
pH: 7 (ref 5.0–8.0)

## 2010-12-19 LAB — HEPATIC FUNCTION PANEL
ALT: 18 U/L (ref 0–35)
Albumin: 3.6 g/dL (ref 3.5–5.2)
Alkaline Phosphatase: 72 U/L (ref 39–117)
Bilirubin, Direct: 0.1 mg/dL (ref 0.0–0.3)
Total Protein: 7 g/dL (ref 6.0–8.3)

## 2010-12-19 LAB — CBC WITH DIFFERENTIAL/PLATELET
Basophils Relative: 0.6 % (ref 0.0–3.0)
Eosinophils Relative: 5.4 % — ABNORMAL HIGH (ref 0.0–5.0)
Hemoglobin: 13 g/dL (ref 12.0–15.0)
MCHC: 34.3 g/dL (ref 30.0–36.0)
MCV: 85.1 fl (ref 78.0–100.0)
Monocytes Absolute: 0.4 10*3/uL (ref 0.1–1.0)
Neutro Abs: 4.2 10*3/uL (ref 1.4–7.7)
Neutrophils Relative %: 58.5 % (ref 43.0–77.0)
RBC: 4.46 Mil/uL (ref 3.87–5.11)
WBC: 7.2 10*3/uL (ref 4.5–10.5)

## 2010-12-19 LAB — BASIC METABOLIC PANEL
CO2: 25 mEq/L (ref 19–32)
Chloride: 109 mEq/L (ref 96–112)
Creatinine, Ser: 0.8 mg/dL (ref 0.4–1.2)
Potassium: 5 mEq/L (ref 3.5–5.1)
Sodium: 141 mEq/L (ref 135–145)

## 2010-12-19 LAB — TSH: TSH: 1.02 u[IU]/mL (ref 0.35–5.50)

## 2010-12-19 LAB — LIPID PANEL
LDL Cholesterol: 61 mg/dL (ref 0–99)
Total CHOL/HDL Ratio: 3
Triglycerides: 139 mg/dL (ref 0.0–149.0)

## 2010-12-24 ENCOUNTER — Encounter: Payer: Self-pay | Admitting: Internal Medicine

## 2010-12-24 ENCOUNTER — Encounter (INDEPENDENT_AMBULATORY_CARE_PROVIDER_SITE_OTHER): Payer: Commercial Managed Care - PPO | Admitting: Internal Medicine

## 2010-12-24 DIAGNOSIS — Z0000 Encounter for general adult medical examination without abnormal findings: Secondary | ICD-10-CM

## 2011-01-03 NOTE — Assessment & Plan Note (Signed)
Summary: Physical - STC   Vital Signs:  Patient profile:   35 year old female Height:      68 inches (172.72 cm) Weight:      229.2 pounds (104.18 kg) BMI:     34.98 O2 Sat:      98 % on Room air Temp:     98.6 degrees F (37.00 degrees C) oral Pulse rate:   71 / minute BP sitting:   122 / 72  (left arm) Cuff size:   large  Vitals Entered By: Orlan Leavens RMA (December 24, 2010 8:10 AM)  O2 Flow:  Room air CC: CPX Is Patient Diabetic? No Pain Assessment Patient in pain? no      Comments Request refills on all meds   Primary Care Provider:  Newt Lukes MD  CC:  CPX.  History of Present Illness: patient is here today for annual physical. Patient feels well and has no complaints.   also reviewed chronic med issues: depression hx - on lexapro - symptoms well controlled on same - has been on same meds x last 8 yrs prev tried celexa - ineffective control of symptoms   dyslipidemia - ++FH same - reports compliance with ongoing medical treatment and no changes in medication dose or frequency. denies adverse side effects related to current therapy. no muscle or GI problems  GERD - reports compliance with ongoing medical treatment and no changes in medication dose or frequency. denies adverse side effects related to current therapy. no abd pain, n/v or change bms   Preventive Screening-Counseling & Management  Alcohol-Tobacco     Alcohol drinks/day: 0     Alcohol Counseling: not indicated; patient does not drink     Smoking Status: never     Tobacco Counseling: not indicated; no tobacco use  Caffeine-Diet-Exercise     Does Patient Exercise: no     Exercise Counseling: to improve exercise regimen  Safety-Violence-Falls     Seat Belt Use: yes     Helmet Use: yes     Firearms in the Home: firearms in the home     Firearm Counseling: not indicated; uses recommended firearm safety measures     Smoke Detectors: yes     Violence in the Home: no risk  noted  Clinical Review Panels:  Prevention   Last Pap Smear:  Interpretation Result:Negative for intraepithelial Lesion or Malignancy.    (01/16/2010)   Last Colonoscopy:  Results: Normal. Location:  [facility].  (05/11/2001)  Immunizations   Last Tetanus Booster:  Historical (11/19/2007)  Lipid Management   Cholesterol:  130 (12/19/2010)   LDL (bad choesterol):  61 (12/19/2010)   HDL (good cholesterol):  40.80 (12/19/2010)  CBC   WBC:  7.2 (12/19/2010)   RBC:  4.46 (12/19/2010)   Hgb:  13.0 (12/19/2010)   Hct:  38.0 (12/19/2010)   Platelets:  318.0 (12/19/2010)   MCV  85.1 (12/19/2010)   MCHC  34.3 (12/19/2010)   RDW  13.7 (12/19/2010)   PMN:  58.5 (12/19/2010)   Lymphs:  29.8 (12/19/2010)   Monos:  5.7 (12/19/2010)   Eosinophils:  5.4 (12/19/2010)   Basophil:  0.6 (12/19/2010)  Complete Metabolic Panel   Glucose:  82 (12/19/2010)   Sodium:  141 (12/19/2010)   Potassium:  5.0 (12/19/2010)   Chloride:  109 (12/19/2010)   CO2:  25 (12/19/2010)   BUN:  14 (12/19/2010)   Creatinine:  0.8 (12/19/2010)   Albumin:  3.6 (12/19/2010)   Total Protein:  7.0 (12/19/2010)   Calcium:  9.5 (12/19/2010)   Total Bili:  0.3 (12/19/2010)   Alk Phos:  72 (12/19/2010)   SGPT (ALT):  18 (12/19/2010)   SGOT (AST):  19 (12/19/2010)   Current Medications (verified): 1)  Flonase 50 Mcg/act Susp (Fluticasone Propionate) .Marland Kitchen.. 1 Sprays in Each Nostril Two Times A Day Prn 2)  Proair Hfa 108 (90 Base) Mcg/act Aers (Albuterol Sulfate) .Marland Kitchen.. 1-2 Inhalations Every 4 H As Directed...prn 3)  Simvastatin 40 Mg Tabs (Simvastatin) .... Take 1 Tablet By Mouth Once A Day 4)  Nexium 40 Mg Pack (Esomeprazole Magnesium) .... Take 1 Tablet By Mouth Once A Day 5)  Lexapro 20 Mg Tabs (Escitalopram Oxalate) .Marland Kitchen.. 1 By Mouth Once Daily 6)  Patanol 0.1 % Soln (Olopatadine Hcl) .Marland Kitchen.. 1 Drop in Each Eye Two Times A Day As Needed 7)  Singulair 10 Mg Tabs (Montelukast Sodium) .... Take 1 Tablet By  Mouth Once A Day 8)  Nuvaring 0.12-0.015 Mg/24hr Ring (Etonogestrel-Ethinyl Estradiol) .... Use As Directed 9)  Zyrtec Hives Relief 10 Mg Tabs (Cetirizine Hcl) .... Take 1 By Mouth Qd 10)  Multivitamins  Tabs (Multiple Vitamin) .... Take 1 By Mouth Once Daily 11)  Fenofibrate Micronized 67 Mg Caps (Fenofibrate Micronized) .Marland Kitchen.. 1 By Mouth Once Daily 12)  Vitamin C 500 Mg Tabs (Ascorbic Acid) .... Take 1 By Mouth Once Daily 13)  Vitamin D3 1000 Unit Caps (Cholecalciferol) .... Take 1 By Mouth Once Daily  Allergies (verified): No Known Drug Allergies  Past History:  Past medical, surgical, family and social histories (including risk factors) reviewed, and no changes noted (except as noted below).  Past Medical History: ALLERGIC RHINITIS Hx of PALPITATIONS  HYPERCHOLESTEROLEMIA  OBESITY  GASTROESOPHAGEAL REFLUX DISEASE ANXIETY DEPRESSION Anemia  MD roster: gyn-[facility] @ [facility]  [facility] - [place]  Past Surgical History: Reviewed history from 12/11/2009 and no changes required. none  Family History: Reviewed history from 12/11/2009 and no changes required. Father is alive age 76 w/ hx chol; also smoker Mother is alive age 104 w/ hx of HBP, DM, Chol, and breast cancer; also obesity and OA 1Sibling- Brother with chol  Social History: Reviewed history from 12/11/2009 and no changes required. Married, lives with spouse no children never smoked  social alcohol Employed - [employer]  Review of Systems       see HPI above. I have reviewed all other systems and they were negative.   Physical Exam  General:  alert, well-developed, well-nourished, and cooperative to examination.   spouse at side.   Head:  Normocephalic and atraumatic without obvious abnormalities. No apparent alopecia or balding. Eyes:  vision grossly intact; pupils equal, round and reactive to light.  conjunctiva and lids normal.    Ears:  normal pinnae bilaterally, without erythema, swelling, or  tenderness to palpation. TMs clear, without effusion, or cerumen impaction. Hearing grossly normal bilaterally  Mouth:  teeth and gums in good repair; mucous membranes moist, without lesions or ulcers. oropharynx clear without exudate, no erythema. prominent tonsils Neck:  supple, full ROM, no masses, no thyromegaly; no thyroid nodules or tenderness. no JVD or carotid bruits.   Lungs:  normal respiratory effort, no intercostal retractions or use of accessory muscles; normal breath sounds bilaterally - no crackles and no wheezes.    Heart:  normal rate, regular rhythm, no murmur, and no rub. BLE without edema.  Abdomen:  soft, non-tender, normal bowel sounds, no distention; no masses and no appreciable hepatomegaly or splenomegaly.  Genitalia:  defer to gyn Msk:  No deformity or scoliosis noted of thoracic or lumbar spine.   Neurologic:  alert & oriented X3 and cranial nerves II-XII symetrically intact.  strength normal in all extremities, sensation intact to light touch, and gait normal. speech fluent without dysarthria or aphasia; follows commands with good comprehension.  Skin:  no rashes, vesicles, ulcers, or erythema. No nodules or irregularity to palpation.  Psych:  Oriented X3, memory intact for recent and remote, normally interactive, good eye contact, not anxious appearing, not depressed appearing, and not agitated.      Impression & Recommendations:  Problem # 1:  PHYSICAL EXAMINATION (ICD-V70.0) Patient has been counseled on age-appropriate routine health concerns for screening and prevention. These are reviewed and up-to-date. Immunizations are up-to-date or declined. Labs reviewed.   Problem # 2:  HYPERCHOLESTEROLEMIA (ICD-272.0)  Her updated medication list for this problem includes:    Simvastatin 40 Mg Tabs (Simvastatin) .Marland Kitchen... Take 1 tablet by mouth once a day    Fenofibrate Micronized 67 Mg Caps (Fenofibrate micronized) .Marland Kitchen... 1 by mouth once daily  prior FLP shows isolated  inc in TG -now improved on cont statin and fenofibrate also reviewed need for weight control/loss and increase in physical exercise - pt understands and agrees  Labs Reviewed: SGOT: 19 (12/19/2010)   SGPT: 18 (12/19/2010)   HDL:40.80 (12/19/2010), 55.30 (06/07/2010)  LDL:61 (12/19/2010), 89 (06/07/2010)  Chol:130 (12/19/2010), 181 (06/07/2010)  Trig:139.0 (12/19/2010), 183.0 (06/07/2010)  Complete Medication List: 1)  Flonase 50 Mcg/act Susp (Fluticasone propionate) .Marland Kitchen.. 1 sprays in each nostril two times a day prn 2)  Proair Hfa 108 (90 Base) Mcg/act Aers (Albuterol sulfate) .Marland Kitchen.. 1-2 inhalations every 4 h as directed...prn 3)  Simvastatin 40 Mg Tabs (Simvastatin) .... Take 1 tablet by mouth once a day 4)  Nexium 40 Mg Pack (Esomeprazole magnesium) .... Take 1 tablet by mouth once a day 5)  Lexapro 20 Mg Tabs (Escitalopram oxalate) .Marland Kitchen.. 1 by mouth once daily 6)  Patanol 0.1 % Soln (Olopatadine hcl) .Marland Kitchen.. 1 drop in each eye two times a day as needed 7)  Singulair 10 Mg Tabs (Montelukast sodium) .... Take 1 tablet by mouth once a day 8)  Nuvaring 0.12-0.015 Mg/24hr Ring (Etonogestrel-ethinyl estradiol) .... Use as directed 9)  Zyrtec Hives Relief 10 Mg Tabs (Cetirizine hcl) .... Take 1 by mouth qd 10)  Multivitamins Tabs (Multiple vitamin) .... Take 1 by mouth once daily 11)  Fenofibrate Micronized 67 Mg Caps (Fenofibrate micronized) .Marland Kitchen.. 1 by mouth once daily 12)  Vitamin C 500 Mg Tabs (Ascorbic acid) .... Take 1 by mouth once daily 13)  Vitamin D3 1000 Unit Caps (Cholecalciferol) .... Take 1 by mouth once daily  Patient Instructions: 1)  it was good to see you today. 2)  exam and labes today look good -  3)  great job on the weight loss - keep up the good work [employer] and exercise 4)  try adding omega 3 as discussed for your HDL 5)  refills on all meds done, no changes rec at this time 6)  Please schedule a follow-up appointment in 6 months for your chrolesterol check& labs, call  sooner if problems.  Prescriptions: FENOFIBRATE MICRONIZED 67 MG CAPS (FENOFIBRATE MICRONIZED) 1 by mouth once daily  #90 x 3   Entered by:   Orlan Leavens RMA   Authorized by:   Newt Lukes MD   Signed by:   Orlan Leavens RMA on 12/24/2010   Method  used:   Electronically to        [facility]* (retail)       [address]       [address] Shipping/mailing       [place], [place]  [address]       Ph: [phone]       Fax: [phone]   RxID:   [id number] SINGULAIR 10 MG TABS (MONTELUKAST SODIUM) Take 1 tablet by mouth once a day  #90 x 1   Entered by:   Orlan Leavens RMA   Authorized by:   Newt Lukes MD   Signed by:   Orlan Leavens RMA on 12/24/2010   Method used:   Electronically to        [facility]* (retail)       [address]       [address] Shipping/mailing       [place], [place]  [address]       Ph: [phone]       Fax: [phone]   RxID:   [id number] PATANOL 0.1 % SOLN (OLOPATADINE HCL) 1 drop in each eye two times a day as needed  #3 x 3   Entered by:   Orlan Leavens RMA   Authorized by:   Newt Lukes MD   Signed by:   Orlan Leavens RMA on 12/24/2010   Method used:   Electronically to        [facility]* (retail)       [address]       [address] Shipping/mailing       [place], [place]  [address]       Ph: [phone]       Fax: [phone]   RxID:   [id number] LEXAPRO 20 MG TABS (ESCITALOPRAM OXALATE) 1 by mouth once daily  #90 x 3   Entered by:   Orlan Leavens RMA   Authorized by:   Newt Lukes MD   Signed by:   Orlan Leavens RMA on 12/24/2010   Method used:   Electronically to        [facility]* (retail)       [address]       [address] Shipping/mailing       [place], [place]  [address]       Ph: [phone]       Fax: [phone]   RxID:   [id number] NEXIUM 40 MG PACK (ESOMEPRAZOLE MAGNESIUM) Take 1 tablet by mouth once a day  #90  x 3   Entered by:   Orlan Leavens RMA   Authorized by:   Newt Lukes MD   Signed by:   Orlan Leavens RMA on 12/24/2010   Method used:   Electronically to        [facility]* (retail)       [address]       [address] Shipping/mailing       [place], [place]  [address]       Ph: [phone]       Fax: [phone]   RxID:   [id number] SIMVASTATIN 40 MG TABS (SIMVASTATIN) Take 1 tablet by mouth once a day  #90 x 3   Entered by:   Orlan Leavens RMA   Authorized by:   Newt Lukes MD   Signed by:   Orlan Leavens RMA  on 12/24/2010   Method used:   Electronically to        [facility]* (retail)       [address]       [address] Shipping/mailing       [place], [place]  [address]       Ph: [phone]       Fax: [phone]   RxID:   [id number]    Orders Added: 1)  Est. Patient 18-39 years [[id number]]   Immunization History:  Tetanus/Td Immunization History:    Tetanus/Td:  historical (11/19/2007)   Immunization History:  Tetanus/Td Immunization History:    Tetanus/Td:  Historical (11/19/2007)   Pap Smear  Procedure date:  01/16/2010  Findings:      Interpretation Result:Negative for intraepithelial Lesion or Malignancy.

## 2011-05-18 ENCOUNTER — Encounter: Payer: Self-pay | Admitting: Internal Medicine

## 2011-08-13 ENCOUNTER — Other Ambulatory Visit: Payer: Self-pay | Admitting: *Deleted

## 2011-08-13 MED ORDER — MONTELUKAST SODIUM 10 MG PO TABS: 10.0000 mg | ORAL_TABLET | Freq: Every day | ORAL | Status: DC

## 2012-02-13 ENCOUNTER — Other Ambulatory Visit: Payer: Self-pay

## 2012-02-13 ENCOUNTER — Telehealth: Payer: Self-pay | Admitting: *Deleted

## 2012-02-13 DIAGNOSIS — Z0000 Encounter for general adult medical examination without abnormal findings: Secondary | ICD-10-CM

## 2012-02-13 MED ORDER — MONTELUKAST SODIUM 10 MG PO TABS: 10.0000 mg | ORAL_TABLET | Freq: Every day | ORAL | Status: DC

## 2012-02-13 MED ORDER — SIMVASTATIN 40 MG PO TABS: 40.0000 mg | ORAL_TABLET | Freq: Every day | ORAL | Status: DC

## 2012-02-13 MED ORDER — FENOFIBRATE MICRONIZED 67 MG PO CAPS: 67.0000 mg | ORAL_CAPSULE | Freq: Every day | ORAL | Status: DC

## 2012-02-13 MED ORDER — ESOMEPRAZOLE MAGNESIUM 40 MG PO CPDR: 40.0000 mg | DELAYED_RELEASE_CAPSULE | Freq: Every day | ORAL | Status: DC

## 2012-02-13 MED ORDER — ESCITALOPRAM OXALATE 20 MG PO TABS: 20.0000 mg | ORAL_TABLET | Freq: Every day | ORAL | Status: DC

## 2012-02-13 NOTE — Telephone Encounter (Signed)
Received staff msg pt made cpx for may need labs entered... 02/13/12@9 :14am/LMB

## 2012-03-13 ENCOUNTER — Other Ambulatory Visit (INDEPENDENT_AMBULATORY_CARE_PROVIDER_SITE_OTHER): Payer: Commercial Managed Care - PPO

## 2012-03-13 DIAGNOSIS — Z0000 Encounter for general adult medical examination without abnormal findings: Secondary | ICD-10-CM

## 2012-03-13 LAB — URINALYSIS, ROUTINE W REFLEX MICROSCOPIC
Bilirubin Urine: NEGATIVE
Hgb urine dipstick: NEGATIVE
Leukocytes, UA: NEGATIVE
Nitrite: NEGATIVE
Urobilinogen, UA: 0.2 (ref 0.0–1.0)

## 2012-03-13 LAB — HEPATIC FUNCTION PANEL
ALT: 17 U/L (ref 0–35)
Alkaline Phosphatase: 64 U/L (ref 39–117)
Bilirubin, Direct: 0.1 mg/dL (ref 0.0–0.3)
Total Bilirubin: 0.3 mg/dL (ref 0.3–1.2)

## 2012-03-13 LAB — CBC WITH DIFFERENTIAL/PLATELET
Basophils Absolute: 0 10*3/uL (ref 0.0–0.1)
Eosinophils Absolute: 0.6 10*3/uL (ref 0.0–0.7)
HCT: 39.9 % (ref 36.0–46.0)
Lymphs Abs: 2.1 10*3/uL (ref 0.7–4.0)
MCHC: 33.8 g/dL (ref 30.0–36.0)
Monocytes Absolute: 0.6 10*3/uL (ref 0.1–1.0)
Monocytes Relative: 5.8 % (ref 3.0–12.0)
Neutro Abs: 6.6 10*3/uL (ref 1.4–7.7)
Platelets: 313 10*3/uL (ref 150.0–400.0)
RDW: 13.6 % (ref 11.5–14.6)

## 2012-03-13 LAB — BASIC METABOLIC PANEL
CO2: 24 mEq/L (ref 19–32)
Calcium: 9.5 mg/dL (ref 8.4–10.5)
Creatinine, Ser: 0.8 mg/dL (ref 0.4–1.2)
GFR: 89.2 mL/min (ref >60.00)
Sodium: 140 mEq/L (ref 135–145)

## 2012-03-13 LAB — LIPID PANEL
LDL Cholesterol: 67 mg/dL (ref 0–99)
Total CHOL/HDL Ratio: 2

## 2012-03-13 LAB — TSH: TSH: 1.47 u[IU]/mL (ref 0.35–5.50)

## 2012-03-18 ENCOUNTER — Encounter: Payer: Self-pay | Admitting: Internal Medicine

## 2012-03-18 ENCOUNTER — Ambulatory Visit (INDEPENDENT_AMBULATORY_CARE_PROVIDER_SITE_OTHER): Payer: 59 | Admitting: Internal Medicine

## 2012-03-18 VITALS — BP 112/80 | HR 85 | Temp 97.4°F | Ht 70.0 in | Wt 246.0 lb

## 2012-03-18 DIAGNOSIS — E669 Obesity, unspecified: Secondary | ICD-10-CM

## 2012-03-18 DIAGNOSIS — J309 Allergic rhinitis, unspecified: Secondary | ICD-10-CM

## 2012-03-18 DIAGNOSIS — Z0000 Encounter for general adult medical examination without abnormal findings: Secondary | ICD-10-CM

## 2012-03-18 DIAGNOSIS — E7800 Pure hypercholesterolemia, unspecified: Secondary | ICD-10-CM

## 2012-03-18 MED ORDER — OMEGA-3 FATTY ACIDS 1000 MG PO CAPS: 1.0000 g | ORAL_CAPSULE | Freq: Every day | ORAL | Status: DC

## 2012-03-18 NOTE — Assessment & Plan Note (Signed)
Weight trends reviewed Education provided on need to resume healthy lifestyle habits (diet, exercise) and work on weight reduction 

## 2012-03-18 NOTE — Patient Instructions (Signed)
It was good to see you today. Health Maintenance reviewed - all recommended immunizations and age-appropriate screenings are up-to-date.  Medications reviewed, no changes at this time.  Refill on medication(s) as discussed today. Work on lifestyle changes as discussed (low fat, low carb, increased protein diet; improved exercise efforts; weight loss) to control sugar, blood pressure and cholesterol levels and/or reduce risk of developing other medical problems. Look into [email or web address][email or web address] or other type of food journal to assist you in this process. Please schedule followup in 1 year for medical physical and labs, call sooner if problems.

## 2012-03-18 NOTE — Assessment & Plan Note (Signed)
On simva and fenofib - lipids good The current medical regimen is effective;  continue present plan and medications.  Advised to watch weight and diet/exercise 

## 2012-03-18 NOTE — Assessment & Plan Note (Signed)
Exacerbated by in-house cat - On max med therapy - eosinophil elevation stable The current medical regimen is effective;  continue present plan and medications.

## 2012-03-18 NOTE — Progress Notes (Signed)
Subjective:    [name], female    DOB: 1976-09-12, 36 y.o.   MRN: [id number]  HPI patient is here today for annual physical. Patient feels well and has no complaints.  Past Medical History  Diagnosis Date  . ALLERGIC RHINITIS   . ANEMIA-NOS   . ANXIETY DEPRESSION   . CONSTIPATION   . DEPRESSION   . Esophageal reflux   . HYPERCHOLESTEROLEMIA   . OBESITY   . Palpitations    Family History  Problem Relation Age of Onset  . Hypertension Mother   . Diabetes Mother   . Hyperlipidemia Mother   . Breast cancer Mother 43  . Osteoarthritis Mother   . Hyperlipidemia Father   . Hyperlipidemia Brother   . Coronary artery disease Father     MI age 54   History  Substance Use Topics  . Smoking status: Never Smoker   . Smokeless tobacco: Not on file   Comment: Married, lives with spouse  . Alcohol Use: No    Review of Systems Constitutional: Negative for fever or weight change.  Respiratory: Negative for cough and shortness of breath.   Cardiovascular: Negative for chest pain or palpitations.  Gastrointestinal: Negative for abdominal pain, no bowel changes.  Musculoskeletal: Negative for gait problem or joint swelling.  Skin: Negative for rash.  Neurological: Negative for dizziness or headache.  No other specific complaints in a complete review of systems (except as listed in HPI above).     Objective:   Physical Exam BP 112/80  Pulse 85  Temp(Src) 97.4 F (36.3 C) (Oral)  Ht 5\' 10"  (1.778 m)  Wt 246 lb (111.585 kg)  BMI 35.30 kg/m2  SpO2 98% Wt Readings from Last 3 Encounters:  03/18/12 246 lb (111.585 kg)  12/24/10 229 lb 3.2 oz (103.964 kg)  06/11/10 252 lb (114.306 kg)   Constitutional: She appears well-developed and well-nourished. No distress.  HENT: Head: Normocephalic and atraumatic. Ears: B TMs ok, no erythema or effusion; Nose: Nose normal.  Mouth/Throat: Oropharynx is clear and moist. No oropharyngeal exudate.  Eyes: Conjunctivae  and EOM are normal. Pupils are equal, round, and reactive to light. No scleral icterus.  Neck: Normal range of motion. Neck supple. No JVD present. No thyromegaly present.  Cardiovascular: Normal rate, regular rhythm and normal heart sounds.  No murmur heard. No BLE edema. Pulmonary/Chest: Effort normal and breath sounds normal. No respiratory distress. She has no wheezes.  Abdominal: Soft. Bowel sounds are normal. She exhibits no distension. There is no tenderness. no masses Musculoskeletal: Normal range of motion, no joint effusions. No gross deformities Neurological: She is alert and oriented to person, place, and time. No cranial nerve deficit. Coordination normal.  Skin: Skin is warm and dry. No rash noted. No erythema.  Psychiatric: She has a normal mood and affect. Her behavior is normal. Judgment and thought content normal.   Lab Results  Component Value Date   WBC 10.0 03/13/2012   HGB 13.5 03/13/2012   HCT 39.9 03/13/2012   PLT 313.0 03/13/2012   GLUCOSE 83 03/13/2012   CHOL 162 03/13/2012   TRIG 144.0 03/13/2012   HDL 66.60 03/13/2012   LDLDIRECT 82.1 12/06/2009   LDLCALC 67 03/13/2012   ALT 17 03/13/2012   AST 20 03/13/2012   NA 140 03/13/2012   K 4.5 03/13/2012   CL 106 03/13/2012   CREATININE 0.8 03/13/2012   BUN 13 03/13/2012   CO2 24 03/13/2012   TSH 1.47 03/13/2012  Assessment & Plan:  CPX/v70.0 - Patient has been counseled on age-appropriate routine health concerns for screening and prevention. These are reviewed and up-to-date. Immunizations are up-to-date or declined. Labs reviewed.

## 2012-08-17 ENCOUNTER — Other Ambulatory Visit: Payer: Self-pay | Admitting: Internal Medicine

## 2012-09-07 ENCOUNTER — Other Ambulatory Visit: Payer: Self-pay | Admitting: Internal Medicine

## 2012-10-22 ENCOUNTER — Telehealth: Payer: Self-pay | Admitting: *Deleted

## 2012-10-22 MED ORDER — PANTOPRAZOLE SODIUM 40 MG PO TBEC: 40.0000 mg | DELAYED_RELEASE_TABLET | Freq: Every day | ORAL | Status: DC

## 2012-10-22 NOTE — Telephone Encounter (Signed)
Ok to change PPI - i have done so and sent new erx - thanks

## 2012-10-22 NOTE — Telephone Encounter (Signed)
Received fax stating pt is currently receiving nexium from our pharmacy. Nexium is currently available to employees insured through [employer] with a co-pay of $0. Beginning 11/18/12 nexium will change from 0 copay to $25 per month. Pantoprazole will be available to patients with a co-pay of $0. In order to be proactive regarding this change we are contacting md to ask to consider changing. If ok will need new rx for pantoprazole...Raechel Chute

## 2012-10-27 ENCOUNTER — Other Ambulatory Visit: Payer: Self-pay | Admitting: Internal Medicine

## 2012-11-23 ENCOUNTER — Ambulatory Visit (INDEPENDENT_AMBULATORY_CARE_PROVIDER_SITE_OTHER): Payer: 59 | Admitting: Internal Medicine

## 2012-11-23 ENCOUNTER — Ambulatory Visit: Payer: 59 | Admitting: Internal Medicine

## 2012-11-23 ENCOUNTER — Encounter: Payer: Self-pay | Admitting: Internal Medicine

## 2012-11-23 VITALS — BP 130/78 | HR 92 | Temp 98.9°F | Ht 70.0 in | Wt 256.8 lb

## 2012-11-23 DIAGNOSIS — R51 Headache: Secondary | ICD-10-CM

## 2012-11-23 DIAGNOSIS — R519 Headache, unspecified: Secondary | ICD-10-CM

## 2012-11-23 NOTE — Progress Notes (Signed)
Subjective:    [name], female    DOB: 26-Jan-1976, 37 y.o.   MRN: [id number]  Headache  This is a new problem. The current episode started more than 1 month ago (08/2012 onset). The problem occurs intermittently. The problem has been waxing and waning. The pain is located in the left unilateral and occipital region. The pain does not radiate. The pain quality is not similar to prior headaches. The quality of the pain is described as stabbing and aching. The pain is severe. Associated symptoms include sinus pressure. Pertinent negatives include no back pain, coughing, dizziness, ear pain, eye pain, eye redness, fever, loss of balance, nausea, neck pain, numbness, phonophobia, photophobia, rhinorrhea, scalp tenderness, seizures, sore throat, tinnitus, vomiting, weakness or weight loss. Nothing aggravates the symptoms. She has tried NSAIDs for the symptoms. The treatment provided no relief. Her past medical history is significant for obesity. There is no history of cancer, cluster headaches, hypertension, immunosuppression, migraine headaches, migraines in the family, recent head traumas or TMJ.   Past Medical History  Diagnosis Date  . ALLERGIC RHINITIS   . ANEMIA-NOS   . ANXIETY DEPRESSION   . CONSTIPATION   . DEPRESSION   . Esophageal reflux   . HYPERCHOLESTEROLEMIA   . OBESITY   . Palpitations      Review of Systems  Constitutional: Negative for fever and weight loss.  HENT: Positive for sinus pressure. Negative for ear pain, sore throat, rhinorrhea, neck pain and tinnitus.   Eyes: Negative for photophobia, pain and redness.  Respiratory: Negative for cough.   Gastrointestinal: Negative for nausea and vomiting.  Musculoskeletal: Negative for back pain.  Neurological: Positive for headaches. Negative for dizziness, seizures, weakness, numbness and loss of balance.       Objective:   Physical Exam BP 130/78  Pulse 92  Temp 98.9 F (37.2 C) (Oral)  Ht 5\' 10"   (1.778 m)  Wt 256 lb 12.8 oz (116.484 kg)  BMI 36.85 kg/m2  SpO2 95% Wt Readings from Last 3 Encounters:  11/23/12 256 lb 12.8 oz (116.484 kg)  03/18/12 246 lb (111.585 kg)  12/24/10 229 lb 3.2 oz (103.964 kg)   Constitutional: She is obese, but appears well-developed and well-nourished. No distress.  HENT: Head: Normocephalic and atraumatic. Ears: B TMs ok, no erythema or effusion; Nose: Nose normal. Mouth/Throat: Oropharynx is clear and moist. No oropharyngeal exudate.  Eyes: Conjunctivae and EOM are normal. Pupils are equal, round, and reactive to light. No scleral icterus.  Neck: Normal range of motion. Neck supple. No JVD present. No thyromegaly present.  Cardiovascular: Normal rate, regular rhythm and normal heart sounds.  No murmur heard. No BLE edema. Pulmonary/Chest: Effort normal and breath sounds normal. No respiratory distress. She has no wheezes.  Neurological: She is alert and oriented to person, place, and time. No cranial nerve deficit. Coordination, speech, balance are normal.  Skin: Skin is warm and dry. No rash noted. No erythema.  Psychiatric: She has a normal mood and affect. Her behavior is normal. Judgment and thought content normal.   Lab Results  Component Value Date   WBC 10.0 03/13/2012   HGB 13.5 03/13/2012   HCT 39.9 03/13/2012   PLT 313.0 03/13/2012   GLUCOSE 83 03/13/2012   CHOL 162 03/13/2012   TRIG 144.0 03/13/2012   HDL 66.60 03/13/2012   LDLDIRECT 82.1 12/06/2009   LDLCALC 67 03/13/2012   ALT 17 03/13/2012   AST 20 03/13/2012   NA 140 03/13/2012   K  4.5 03/13/2012   CL 106 03/13/2012   CREATININE 0.8 03/13/2012   BUN 13 03/13/2012   CO2 24 03/13/2012   TSH 1.47 03/13/2012       Assessment & Plan:   Posterior L occipital headache - intermittent but new symptoms >71months No trauma or red flags on hx/exam, neuro exam benign  Check MRI/MRA brain Consider migraine therapy if unremarkable recommended OTC alternating NSAID and tylenol until further imaging  done

## 2012-11-23 NOTE — Patient Instructions (Signed)
It was good to see you today. We have reviewed your prior records including labs and tests today Test(s) ordered today.- MRI/MRA brain - Our office will contact you regarding appointment(s) once made. Your results will then be released to MyChart (or called to you) after review, usually within 72hours after test completion. If any changes need to be made, you will be notified at that same time. Alternate between ibuprofen and tylenol for aches, pain symptoms as discussed Further treatment will depend on test results and symptoms

## 2012-11-26 ENCOUNTER — Ambulatory Visit (HOSPITAL_COMMUNITY): Payer: 59

## 2012-11-26 ENCOUNTER — Ambulatory Visit (HOSPITAL_COMMUNITY)
Admission: RE | Admit: 2012-11-26 | Discharge: 2012-11-26 | Disposition: A | Discharge status: Home / Self Care | Payer: 59 | Source: Ambulatory Visit | Attending: Internal Medicine | Admitting: Internal Medicine

## 2012-11-26 DIAGNOSIS — R519 Headache, unspecified: Secondary | ICD-10-CM

## 2012-11-26 DIAGNOSIS — R4182 Altered mental status, unspecified: Secondary | ICD-10-CM | POA: Insufficient documentation

## 2012-11-26 DIAGNOSIS — R51 Headache: Secondary | ICD-10-CM | POA: Insufficient documentation

## 2012-11-26 DIAGNOSIS — I999 Unspecified disorder of circulatory system: Secondary | ICD-10-CM | POA: Insufficient documentation

## 2012-11-26 MED ORDER — SUMATRIPTAN SUCCINATE 50 MG PO TABS: 50.0000 mg | ORAL_TABLET | Freq: Once | ORAL | Status: DC | PRN

## 2012-11-26 NOTE — Addendum Note (Signed)
Addended by: Rene Paci A on: 11/26/2012 10:54 AM   Modules accepted: Orders

## 2012-12-23 ENCOUNTER — Telehealth: Payer: Self-pay | Admitting: *Deleted

## 2012-12-23 NOTE — Telephone Encounter (Signed)
Pt decline to have nexium change to protonix she stated protonix doesn't work for her. Faxed form back to [place]...Raechel Chute

## 2012-12-28 ENCOUNTER — Encounter: Payer: Self-pay | Admitting: Internal Medicine

## 2012-12-31 ENCOUNTER — Ambulatory Visit: Payer: 59 | Admitting: Internal Medicine

## 2013-01-02 ENCOUNTER — Other Ambulatory Visit: Payer: Self-pay

## 2013-01-05 ENCOUNTER — Encounter: Payer: Self-pay | Admitting: Internal Medicine

## 2013-01-05 ENCOUNTER — Ambulatory Visit (INDEPENDENT_AMBULATORY_CARE_PROVIDER_SITE_OTHER): Payer: 59 | Admitting: Internal Medicine

## 2013-01-05 VITALS — BP 128/80 | HR 83 | Temp 97.5°F | Wt 253.0 lb

## 2013-01-05 DIAGNOSIS — R519 Headache, unspecified: Secondary | ICD-10-CM

## 2013-01-05 DIAGNOSIS — R51 Headache: Secondary | ICD-10-CM

## 2013-01-05 MED ORDER — INDOMETHACIN 50 MG PO CAPS: 50.0000 mg | ORAL_CAPSULE | Freq: Two times a day (BID) | ORAL | Status: DC

## 2013-01-05 NOTE — Patient Instructions (Signed)
It was good to see you today. We have reviewed your prior records including labs and tests today Try indomethacin for aches, pain symptoms as discussed -Your prescription(s) have been submitted to your pharmacy. Please take as directed and contact our office if you believe you are having problem(s) with the medication(s). we'll make referral to neurology as discussed . Our office will contact you regarding appointment(s) once made.

## 2013-01-05 NOTE — Progress Notes (Signed)
Subjective:    [name], female    DOB: Jun 04, 1976, 37 y.o.   MRN: [id number]  HPI  Here fo follow up headache - conitnued unilateral "zing", quick stab of pain in left lower head Lasts seconds, then resolved pain but residual ache associated with now with midl dizziness Not improved with Imitrex, Excedrin migraine, rx dose ibuprofen  Occurs daily for last 4-5 months (onset 08/2012), but better in past 2 days   Past Medical History  Diagnosis Date  . ALLERGIC RHINITIS   . ANEMIA-NOS   . ANXIETY DEPRESSION   . CONSTIPATION   . DEPRESSION   . Esophageal reflux   . HYPERCHOLESTEROLEMIA   . OBESITY   . Palpitations     Review of Systems  Constitutional: Negative for fever and fatigue.  Neurological: Negative for tremors, seizures, facial asymmetry, speech difficulty, weakness and numbness.       Objective:   Physical Exam BP 128/80  Pulse 83  Temp(Src) 97.5 F (36.4 C) (Oral)  Wt 253 lb (114.76 kg)  BMI 36.3 kg/m2  SpO2 96% Wt Readings from Last 3 Encounters:  01/05/13 253 lb (114.76 kg)  11/23/12 256 lb 12.8 oz (116.484 kg)  03/18/12 246 lb (111.585 kg)   Constitutional: She appears well-developed and well-nourished. No distress.  Neck: Normal range of motion. Neck supple. No JVD present. No thyromegaly present.  Cardiovascular: Normal rate, regular rhythm and normal heart sounds.  No murmur heard. No BLE edema. Pulmonary/Chest: Effort normal and breath sounds normal. No respiratory distress. She has no wheezes.  Neurological: She is alert and oriented to person, place, and time. No cranial nerve deficit. Coordination normal.  Skin: Skin is warm and dry. No rash noted. No erythema.  Psychiatric: She has a normal mood and affect. Her behavior is normal. Judgment and thought content normal.   Lab Results  Component Value Date   WBC 10.0 03/13/2012   HGB 13.5 03/13/2012   HCT 39.9 03/13/2012   PLT 313.0 03/13/2012   GLUCOSE 83 03/13/2012   CHOL  162 03/13/2012   TRIG 144.0 03/13/2012   HDL 66.60 03/13/2012   LDLDIRECT 82.1 12/06/2009   LDLCALC 67 03/13/2012   ALT 17 03/13/2012   AST 20 03/13/2012   NA 140 03/13/2012   K 4.5 03/13/2012   CL 106 03/13/2012   CREATININE 0.8 03/13/2012   BUN 13 03/13/2012   CO2 24 03/13/2012   TSH 1.47 03/13/2012   Mr Brain Wo Contrast  11/26/2012  *RADIOLOGY REPORT*  Clinical Data:  Left-sided pain.  Mental status changes.  Acute but ill-defined vascular disease.  MRI HEAD WITHOUT CONTRAST MRA HEAD WITHOUT CONTRAST  Technique:  Multiplanar, multiecho pulse sequences of the brain and surrounding structures were obtained without intravenous contrast. Angiographic images of the head were obtained using MRA technique without contrast.  Comparison:   None  MRI HEAD  Findings:  The brain has a normal appearance on all pulse sequences without evidence of malformation, atrophy, old or acute infarction, mass lesion, hemorrhage, hydrocephalus or extra-axial collection. Mild asymmetry of the lateral ventricles is not pathologic finding. Dilated perivascular spaces at the base of the brain are normal variant.  No pituitary mass.  No fluid in the sinuses, middle ears or mastoids.  No skull or skull base lesion.  IMPRESSION: Normal examination.  No cause of the described symptoms is identified.  MRA HEAD  Findings: Both internal carotid arteries are widely patent into the brain.  The anterior and middle cerebral  vessels are patent without proximal stenosis, aneurysm or vascular malformation.  Both vertebral arteries are patent to the basilar.  No basilar stenosis.  Posterior circulation branch vessels appear normal.  IMPRESSION: Normal intracranial MR angiography of the large and medium-sized vessels.   Original Report Authenticated By: Paulina Fusi, M.D.        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

## 2013-02-12 ENCOUNTER — Other Ambulatory Visit: Payer: Self-pay | Admitting: Internal Medicine

## 2013-02-16 ENCOUNTER — Other Ambulatory Visit: Payer: Self-pay | Admitting: *Deleted

## 2013-02-16 MED ORDER — ESOMEPRAZOLE MAGNESIUM 40 MG PO CPDR: 40.0000 mg | DELAYED_RELEASE_CAPSULE | Freq: Every day | ORAL | Status: DC

## 2013-03-10 ENCOUNTER — Other Ambulatory Visit: Payer: Self-pay | Admitting: Internal Medicine

## 2013-03-18 ENCOUNTER — Encounter: Payer: 59 | Admitting: Internal Medicine

## 2013-04-06 ENCOUNTER — Telehealth: Payer: Self-pay | Admitting: *Deleted

## 2013-04-06 ENCOUNTER — Other Ambulatory Visit (INDEPENDENT_AMBULATORY_CARE_PROVIDER_SITE_OTHER): Payer: 59

## 2013-04-06 DIAGNOSIS — Z0000 Encounter for general adult medical examination without abnormal findings: Secondary | ICD-10-CM

## 2013-04-06 LAB — CBC WITH DIFFERENTIAL/PLATELET
Basophils Relative: 0.1 % (ref 0.0–3.0)
Eosinophils Relative: 0 % (ref 0.0–5.0)
HCT: 43.2 % (ref 36.0–46.0)
Hemoglobin: 14.6 g/dL (ref 12.0–15.0)
Lymphs Abs: 1.3 10*3/uL (ref 0.7–4.0)
MCV: 83.7 fl (ref 78.0–100.0)
Monocytes Absolute: 0.3 10*3/uL (ref 0.1–1.0)
Neutro Abs: 15.4 10*3/uL — ABNORMAL HIGH (ref 1.4–7.7)
Platelets: 356 10*3/uL (ref 150.0–400.0)
WBC: 17 10*3/uL — ABNORMAL HIGH (ref 4.5–10.5)

## 2013-04-06 LAB — TSH: TSH: 0.45 u[IU]/mL (ref 0.35–5.50)

## 2013-04-06 LAB — BASIC METABOLIC PANEL
BUN: 14 mg/dL (ref 6–23)
Chloride: 107 mEq/L (ref 96–112)
Potassium: 4.7 mEq/L (ref 3.5–5.1)
Sodium: 139 mEq/L (ref 135–145)

## 2013-04-06 LAB — URINALYSIS, ROUTINE W REFLEX MICROSCOPIC
Bilirubin Urine: NEGATIVE
Ketones, ur: NEGATIVE
RBC / HPF: NONE SEEN (ref 0–?)
Specific Gravity, Urine: 1.02 (ref 1.000–1.030)
Total Protein, Urine: NEGATIVE
Urine Glucose: NEGATIVE
pH: 6 (ref 5.0–8.0)

## 2013-04-06 LAB — HEPATIC FUNCTION PANEL
Alkaline Phosphatase: 65 U/L (ref 39–117)
Bilirubin, Direct: 0.1 mg/dL (ref 0.0–0.3)
Total Bilirubin: 0.5 mg/dL (ref 0.3–1.2)

## 2013-04-06 LAB — LIPID PANEL
Cholesterol: 194 mg/dL (ref 0–200)
LDL Cholesterol: 98 mg/dL (ref 0–99)

## 2013-04-06 NOTE — Telephone Encounter (Signed)
Lab call left msg on vm pt is needing cpx labs entered...April [name]

## 2013-04-07 ENCOUNTER — Ambulatory Visit (INDEPENDENT_AMBULATORY_CARE_PROVIDER_SITE_OTHER): Payer: 59 | Admitting: Internal Medicine

## 2013-04-07 ENCOUNTER — Encounter: Payer: Self-pay | Admitting: Internal Medicine

## 2013-04-07 VITALS — BP 112/80 | HR 85 | Temp 98.3°F | Ht 70.0 in | Wt 248.8 lb

## 2013-04-07 DIAGNOSIS — R7309 Other abnormal glucose: Secondary | ICD-10-CM

## 2013-04-07 DIAGNOSIS — M531 Cervicobrachial syndrome: Secondary | ICD-10-CM

## 2013-04-07 DIAGNOSIS — M5481 Occipital neuralgia: Secondary | ICD-10-CM

## 2013-04-07 DIAGNOSIS — D72829 Elevated white blood cell count, unspecified: Secondary | ICD-10-CM

## 2013-04-07 DIAGNOSIS — Z0000 Encounter for general adult medical examination without abnormal findings: Secondary | ICD-10-CM

## 2013-04-07 DIAGNOSIS — R739 Hyperglycemia, unspecified: Secondary | ICD-10-CM

## 2013-04-07 MED ORDER — SIMVASTATIN 40 MG PO TABS: 40.0000 mg | ORAL_TABLET | Freq: Every day | ORAL | Status: DC

## 2013-04-07 MED ORDER — ESOMEPRAZOLE STRONTIUM 49.3 MG PO CPDR: 1.0000 | DELAYED_RELEASE_CAPSULE | Freq: Every day | ORAL | Status: DC

## 2013-04-07 MED ORDER — ALBUTEROL SULFATE HFA 108 (90 BASE) MCG/ACT IN AERS: 2.0000 | INHALATION_SPRAY | Freq: Four times a day (QID) | RESPIRATORY_TRACT | Status: DC | PRN

## 2013-04-07 MED ORDER — OLOPATADINE HCL 0.1 % OP SOLN: 1.0000 [drp] | Freq: Two times a day (BID) | OPHTHALMIC | Status: DC

## 2013-04-07 MED ORDER — FENOFIBRATE MICRONIZED 67 MG PO CAPS: 67.0000 mg | ORAL_CAPSULE | Freq: Every day | ORAL | Status: DC

## 2013-04-07 MED ORDER — MONTELUKAST SODIUM 10 MG PO TABS: 10.0000 mg | ORAL_TABLET | Freq: Every day | ORAL | Status: DC

## 2013-04-07 MED ORDER — FLUTICASONE PROPIONATE 50 MCG/ACT NA SUSP: 2.0000 | Freq: Two times a day (BID) | NASAL | Status: DC

## 2013-04-07 MED ORDER — ESCITALOPRAM OXALATE 20 MG PO TABS: 20.0000 mg | ORAL_TABLET | Freq: Every day | ORAL | Status: DC

## 2013-04-07 NOTE — Patient Instructions (Signed)
It was good to see you today. We have reviewed your prior records including labs and tests today Health Maintenance reviewed - all recommended immunizations and age-appropriate screenings are up-to-date. Medications reviewed and updated, no changes recommended at this time. Refill on medication(s) as discussed today. Test(s) ordered today - recheck CBC and ceck a1c in 2 weeks. Your results will be released to MyChart (or called to you) after review, usually within 72hours after test completion. If any changes need to be made, you will be notified at that same time. Please schedule followup in 1 year for CPX/labs, call sooner if problems.  Health Maintenance, Females A healthy lifestyle and preventative care can promote health and wellness.  Maintain regular health, dental, and eye exams.  Eat a healthy diet. Foods like vegetables, fruits, whole grains, low-fat dairy products, and lean protein foods contain the nutrients you need without too many calories. Decrease your intake of foods high in solid fats, added sugars, and salt. Get information about a proper diet from your caregiver, if necessary.  Regular physical exercise is one of the most important things you can do for your health. Most adults should get at least 150 minutes of moderate-intensity exercise (any activity that increases your heart rate and causes you to sweat) each week. In addition, most adults need muscle-strengthening exercises on 2 or more days a week.   Maintain a healthy weight. The body mass index (BMI) is a screening tool to identify possible weight problems. It provides an estimate of body fat based on height and weight. Your caregiver can help determine your BMI, and can help you achieve or maintain a healthy weight. For adults 20 years and older:  A BMI below 18.5 is considered underweight.  A BMI of 18.5 to 24.9 is normal.  A BMI of 25 to 29.9 is considered overweight.  A BMI of 30 and above is considered  obese.  Maintain normal blood lipids and cholesterol by exercising and minimizing your intake of saturated fat. Eat a balanced diet with plenty of fruits and vegetables. Blood tests for lipids and cholesterol should begin at age 73 and be repeated every 5 years. If your lipid or cholesterol levels are high, you are over 50, or you are a high risk for heart disease, you may need your cholesterol levels checked more frequently.Ongoing high lipid and cholesterol levels should be treated with medicines if diet and exercise are not effective.  If you smoke, find out from your caregiver how to quit. If you do not use tobacco, do not start.  If you are pregnant, do not drink alcohol. If you are breastfeeding, be very cautious about drinking alcohol. If you are not pregnant and choose to drink alcohol, do not exceed 1 drink per day. One drink is considered to be 12 ounces (355 mL) of beer, 5 ounces (148 mL) of wine, or 1.5 ounces (44 mL) of liquor.  Avoid use of street drugs. Do not share needles with anyone. Ask for help if you need support or instructions about stopping the use of drugs.  High blood pressure causes heart disease and increases the risk of stroke. Blood pressure should be checked at least every 1 to 2 years. Ongoing high blood pressure should be treated with medicines, if weight loss and exercise are not effective.  If you are 52 to 36 years old, ask your caregiver if you should take aspirin to prevent strokes.  Diabetes screening involves taking a blood sample to check your fasting  blood sugar level. This should be done once every 3 years, after age 45, if you are within normal weight and without risk factors for diabetes. Testing should be considered at a younger age or be carried out more frequently if you are overweight and have at least 1 risk factor for diabetes.  Breast cancer screening is essential preventative care for women. You should practice "breast self-awareness." This means  understanding the normal appearance and feel of your breasts and may include breast self-examination. Any changes detected, no matter how small, should be reported to a caregiver. Women in their 42s and 30s should have a clinical breast exam (CBE) by a caregiver as part of a regular health exam every 1 to 3 years. After age 52, women should have a CBE every year. Starting at age 33, women should consider having a mammogram (breast X-ray) every year. Women who have a family history of breast cancer should talk to their caregiver about genetic screening. Women at a high risk of breast cancer should talk to their caregiver about having an MRI and a mammogram every year.  The Pap test is a screening test for cervical cancer. Women should have a Pap test starting at age 31. Between ages 10 and 48, Pap tests should be repeated every 2 years. Beginning at age 3, you should have a Pap test every 3 years as long as the past 3 Pap tests have been normal. If you had a hysterectomy for a problem that was not cancer or a condition that could lead to cancer, then you no longer need Pap tests. If you are between ages 59 and 35, and you have had normal Pap tests going back 10 years, you no longer need Pap tests. If you have had past treatment for cervical cancer or a condition that could lead to cancer, you need Pap tests and screening for cancer for at least 20 years after your treatment. If Pap tests have been discontinued, risk factors (such as a new sexual partner) need to be reassessed to determine if screening should be resumed. Some women have medical problems that increase the chance of getting cervical cancer. In these cases, your caregiver may recommend more frequent screening and Pap tests.  The human papillomavirus (HPV) test is an additional test that may be used for cervical cancer screening. The HPV test looks for the virus that can cause the cell changes on the cervix. The cells collected during the Pap test  can be tested for HPV. The HPV test could be used to screen women aged 61 years and older, and should be used in women of any age who have unclear Pap test results. After the age of 41, women should have HPV testing at the same frequency as a Pap test.  Colorectal cancer can be detected and often prevented. Most routine colorectal cancer screening begins at the age of 33 and continues through age 41. However, your caregiver may recommend screening at an earlier age if you have risk factors for colon cancer. On a yearly basis, your caregiver may provide home test kits to check for hidden blood in the stool. Use of a small camera at the end of a tube, to directly examine the colon (sigmoidoscopy or colonoscopy), can detect the earliest forms of colorectal cancer. Talk to your caregiver about this at age 57, when routine screening begins. Direct examination of the colon should be repeated every 5 to 10 years through age 44, unless early forms of pre-cancerous  polyps or small growths are found.  Hepatitis C blood testing is recommended for all people born from [birth year] through 1964 and any individual with known risks for hepatitis C.  Practice safe sex. Use condoms and avoid high-risk sexual practices to reduce the spread of sexually transmitted infections (STIs). Sexually active women aged 29 and younger should be checked for Chlamydia, which is a common sexually transmitted infection. Older women with new or multiple partners should also be tested for Chlamydia. Testing for other STIs is recommended if you are sexually active and at increased risk.  Osteoporosis is a disease in which the bones lose minerals and strength with aging. This can result in serious bone fractures. The risk of osteoporosis can be identified using a bone density scan. Women ages 65 and over and women at risk for fractures or osteoporosis should discuss screening with their caregivers. Ask your caregiver whether you should be taking a  calcium supplement or vitamin D to reduce the rate of osteoporosis.  Menopause can be associated with physical symptoms and risks. Hormone replacement therapy is available to decrease symptoms and risks. You should talk to your caregiver about whether hormone replacement therapy is right for you.  Use sunscreen with a sun protection factor (SPF) of 30 or greater. Apply sunscreen liberally and repeatedly throughout the day. You should seek shade when your shadow is shorter than you. Protect yourself by wearing long sleeves, pants, a wide-brimmed hat, and sunglasses year round, whenever you are outdoors.  Notify your caregiver of new moles or changes in moles, especially if there is a change in shape or color. Also notify your caregiver if a mole is larger than the size of a pencil eraser.  Stay current with your immunizations. Document Released: 05/20/2011 Document Revised: 01/27/2012 Document Reviewed: 05/20/2011 [facility] Patient Information 2013 [place], [place].

## 2013-04-07 NOTE — Progress Notes (Signed)
Subjective:    [name], female    DOB: 19-Jan-1976, 37 y.o.   MRN: [id number]  HPI  patient is here today for annual physical. Patient feels well overall.  Ongoing trigger point injections with neuro for occipital neuralgia   Past Medical History  Diagnosis Date  . ALLERGIC RHINITIS   . ANEMIA-NOS   . ANXIETY DEPRESSION   . CONSTIPATION   . DEPRESSION   . Esophageal reflux   . HYPERCHOLESTEROLEMIA   . OBESITY   . Palpitations    Family History  Problem Relation Age of Onset  . Hypertension Mother   . Diabetes Mother   . Hyperlipidemia Mother   . Breast cancer Mother 12  . Osteoarthritis Mother   . Hyperlipidemia Father   . Hyperlipidemia Brother   . Coronary artery disease Father     MI age 66   History  Substance Use Topics  . Smoking status: Never Smoker   . Smokeless tobacco: Not on file     Comment: Married, lives with spouse  . Alcohol Use: No    Review of Systems  Constitutional: Negative for fever or weight change.  Respiratory: Negative for cough and shortness of breath.   Cardiovascular: Negative for chest pain or palpitations.  Gastrointestinal: Negative for abdominal pain, no bowel changes.  Musculoskeletal: Negative for gait problem or joint swelling.  Skin: Negative for rash.  Neurological: Negative for dizziness or headache.  No other specific complaints in a complete review of systems (except as listed in HPI above).     Objective:   Physical Exam  BP 112/80  Pulse 85  Temp(Src) 98.3 F (36.8 C) (Oral)  Ht 5\' 10"  (1.778 m)  Wt 248 lb 12.8 oz (112.855 kg)  BMI 35.7 kg/m2  SpO2 98% Wt Readings from Last 3 Encounters:  04/07/13 248 lb 12.8 oz (112.855 kg)  01/05/13 253 lb (114.76 kg)  11/23/12 256 lb 12.8 oz (116.484 kg)   Constitutional: She is overweight, but appears well-developed and well-nourished. No distress.  HENT: Head: Normocephalic and atraumatic. Ears: B TMs ok, no erythema or effusion; Nose: Nose  normal. Mouth/Throat: Oropharynx is clear and moist. No oropharyngeal exudate.  Eyes: Conjunctivae and EOM are normal. Pupils are equal, round, and reactive to light. No scleral icterus.  Neck: Normal range of motion. Neck supple. No JVD present. No thyromegaly present.  Cardiovascular: Normal rate, regular rhythm and normal heart sounds.  No murmur heard. No BLE edema. Pulmonary/Chest: Effort normal and breath sounds normal. No respiratory distress. She has no wheezes.  Abdominal: Soft. Bowel sounds are normal. She exhibits no distension. There is no tenderness. no masses Musculoskeletal: Normal range of motion, no joint effusions. No gross deformities Neurological: She is alert and oriented to person, place, and time. No cranial nerve deficit. Coordination normal.  Skin: Skin is warm and dry. No rash noted. No erythema.  Psychiatric: She has a normal mood and affect. Her behavior is normal. Judgment and thought content normal.   Lab Results  Component Value Date   WBC 17.0* 04/06/2013   HGB 14.6 04/06/2013   HCT 43.2 04/06/2013   PLT 356.0 04/06/2013   GLUCOSE 114* 04/06/2013   CHOL 194 04/06/2013   TRIG 128.0 04/06/2013   HDL 70.10 04/06/2013   LDLDIRECT 82.1 12/06/2009   LDLCALC 98 04/06/2013   ALT 18 04/06/2013   AST 19 04/06/2013   NA 139 04/06/2013   K 4.7 04/06/2013   CL 107 04/06/2013   CREATININE 0.8  04/06/2013   BUN 14 04/06/2013   CO2 22 04/06/2013   TSH 0.45 04/06/2013       Assessment & Plan:  CPX/v70.0 - Patient has been counseled on age-appropriate routine health concerns for screening and prevention. These are reviewed and up-to-date. Immunizations are up-to-date or declined. Labs reviewed.  Increase WBC - no localizing symptoms on hx or exam to suggest infection - suspect related to recent dexa trigger point injections 24h prior to labs. Recheck in 2 weeks  Hyperglycemia, fasting - suspect related to dexamethasone injections in past 24 hours before labs. Given family history  diabetes, check A1c. Patient to continue working on weight loss trend with exercise and diet changes as ongoing

## 2013-04-21 ENCOUNTER — Other Ambulatory Visit (INDEPENDENT_AMBULATORY_CARE_PROVIDER_SITE_OTHER): Payer: 59

## 2013-04-21 DIAGNOSIS — R739 Hyperglycemia, unspecified: Secondary | ICD-10-CM

## 2013-04-21 DIAGNOSIS — R7309 Other abnormal glucose: Secondary | ICD-10-CM

## 2013-04-21 DIAGNOSIS — D72829 Elevated white blood cell count, unspecified: Secondary | ICD-10-CM

## 2013-04-21 LAB — CBC WITH DIFFERENTIAL/PLATELET
Basophils Absolute: 0 10*3/uL (ref 0.0–0.1)
Basophils Relative: 0.5 % (ref 0.0–3.0)
HCT: 38.5 % (ref 36.0–46.0)
Hemoglobin: 13 g/dL (ref 12.0–15.0)
Lymphocytes Relative: 26.7 % (ref 12.0–46.0)
Lymphs Abs: 2.4 10*3/uL (ref 0.7–4.0)
Monocytes Relative: 7 % (ref 3.0–12.0)
Neutro Abs: 5.6 10*3/uL (ref 1.4–7.7)
RBC: 4.56 Mil/uL (ref 3.87–5.11)
RDW: 14.2 % (ref 11.5–14.6)

## 2013-09-23 ENCOUNTER — Other Ambulatory Visit: Payer: Self-pay

## 2013-11-29 ENCOUNTER — Encounter: Payer: Self-pay | Admitting: Internal Medicine

## 2013-11-29 MED ORDER — NALTREXONE-BUPROPION HCL ER 8-90 MG PO TB12: ORAL_TABLET | ORAL | Status: DC

## 2014-02-16 LAB — HM PAP SMEAR

## 2014-02-21 LAB — LIPID PANEL
Cholesterol: 173 mg/dL (ref 0–200)
HDL: 63 mg/dL (ref 35–70)
LDL Cholesterol: 61 mg/dL
Triglycerides: 243 mg/dL — AB (ref 40–160)

## 2014-02-21 LAB — BASIC METABOLIC PANEL
BUN: 13 mg/dL (ref 4–21)
Creatinine: 0.8 mg/dL (ref 0.5–1.1)
GLUCOSE: 93 mg/dL
POTASSIUM: 4.2 mmol/L (ref 3.4–5.3)
Sodium: 141 mmol/L (ref 137–147)

## 2014-02-21 LAB — HEPATIC FUNCTION PANEL
ALT: 15 U/L (ref 7–35)
AST: 15 U/L (ref 13–35)

## 2014-02-23 ENCOUNTER — Encounter: Payer: Self-pay | Admitting: Internal Medicine

## 2014-03-14 ENCOUNTER — Ambulatory Visit (INDEPENDENT_AMBULATORY_CARE_PROVIDER_SITE_OTHER): Payer: 59

## 2014-03-14 ENCOUNTER — Encounter: Payer: Self-pay | Admitting: Podiatry

## 2014-03-14 ENCOUNTER — Ambulatory Visit (INDEPENDENT_AMBULATORY_CARE_PROVIDER_SITE_OTHER): Payer: 59 | Admitting: Podiatry

## 2014-03-14 VITALS — BP 120/79 | HR 78 | Resp 15 | Ht 70.0 in | Wt 250.0 lb

## 2014-03-14 DIAGNOSIS — M722 Plantar fascial fibromatosis: Secondary | ICD-10-CM

## 2014-03-14 DIAGNOSIS — M79609 Pain in unspecified limb: Secondary | ICD-10-CM

## 2014-03-14 MED ORDER — TRIAMCINOLONE ACETONIDE 10 MG/ML IJ SUSP
10.0000 mg | Freq: Once | INTRAMUSCULAR | Status: AC
Start: 2014-03-14 — End: 2014-03-14
  Administered 2014-03-14: 10 mg

## 2014-03-14 NOTE — Patient Instructions (Signed)
Remove fascial taping on the left foot and 5 days maximum. Use over-the-counter 200 mg ibuprofen tablets 2 tablets by mouth 3 times a day x7-14 days  Plantar Fasciitis Plantar fasciitis is a common condition that causes foot pain. It is soreness (inflammation) of the band of tough fibrous tissue on the bottom of the foot that runs from the heel bone (calcaneus) to the ball of the foot. The cause of this soreness may be from excessive standing, poor fitting shoes, running on hard surfaces, being overweight, having an abnormal walk, or overuse (this is common in runners) of the painful foot or feet. It is also common in aerobic exercise dancers and ballet dancers. SYMPTOMS  Most people with plantar fasciitis complain of:  Severe pain in the morning on the bottom of their foot especially when taking the first steps out of bed. This pain recedes after a few minutes of walking.  Severe pain is experienced also during walking following a long period of inactivity.  Pain is worse when walking barefoot or up stairs DIAGNOSIS   Your caregiver will diagnose this condition by examining and feeling your foot.  Special tests such as X-rays of your foot, are usually not needed. PREVENTION   Consult a sports medicine professional before beginning a new exercise program.  Walking programs offer a good workout. With walking there is a lower chance of overuse injuries common to runners. There is less impact and less jarring of the joints.  Begin all new exercise programs slowly. If problems or pain develop, decrease the amount of time or distance until you are at a comfortable level.  Wear good shoes and replace them regularly.  Stretch your foot and the heel cords at the back of the ankle (Achilles tendon) both before and after exercise.  Run or exercise on even surfaces that are not hard. For example, asphalt is better than pavement.  Do not run barefoot on hard surfaces.  If using a treadmill, vary  the incline.  Do not continue to workout if you have foot or joint problems. Seek professional help if they do not improve. HOME CARE INSTRUCTIONS   Avoid activities that cause you pain until you recover.  Use ice or cold packs on the problem or painful areas after working out.  Only take over-the-counter or prescription medicines for pain, discomfort, or fever as directed by your caregiver.  Soft shoe inserts or athletic shoes with air or gel sole cushions may be helpful.  If problems continue or become more severe, consult a sports medicine caregiver or your own health care provider. Cortisone is a potent anti-inflammatory medication that may be injected into the painful area. You can discuss this treatment with your caregiver. MAKE SURE YOU:   Understand these instructions.  Will watch your condition.  Will get help right away if you are not doing well or get worse. Document Released: 07/30/2001 Document Revised: 01/27/2012 Document Reviewed: 09/28/2008 [facility] Patient Information 2014 [place], [place].

## 2014-03-14 NOTE — Progress Notes (Signed)
   Subjective:    [name], female    DOB: September 12, 1976, 38 y.o.   MRN: [id number]  HPI Comments: N heel pain L left posterior plantar heel and lateral, medial heel D 2 months O  C sharp pain A barefoot, worse after rest and in the morning T OTC inserts     Review of Systems  HENT: Positive for sinus pressure.   Eyes: Positive for itching.  Cardiovascular: Positive for chest pain and palpitations.  Neurological: Positive for headaches.  Psychiatric/Behavioral: The patient is nervous/anxious.   All other systems reviewed and are negative.      Objective:   Physical Exam Orientated x53 38 year old white female  Vascular: DP and PT pulses 2/4 bilaterally  Neurological: Deferred  Dermatological: Texture and turgor within normal limits  Musculoskeletal: Palpable tenderness inferior left heel, which is the area of maximum tenderness. Mild palpable tenderness posterior left heel without a palpable lesions.  X-ray report left foot  Intact bony structure without fracture or dislocation noted. Inferior calcaneal spur noted.  Radiographic impression: No acute bony abnormality noted left foot      Assessment & Plan:   Assessment: Plantar fasciitis left  Plan: Discussed treatment options including Kenalog injection.. Patient verbally consents to injection. The skin is prepped with alcohol and Betadine and 10 mg of Kenalog mixed with 10 mg of plain Xylocaine and 2.5 mg of plain Marcaine injected inferior heel left for Kenalog injectio n #1.  Patient has custom orthotics which I recommend she wear if comfortable and remove uncomfortable. Discussed shoeing and stretching  Fascial taping applied left Recommended over-the-counter 200 mg ibuprofen by mouth 2 tablets 3 times a day 7-14 days.  Reappoint at patient's request.

## 2014-03-15 ENCOUNTER — Encounter: Payer: Self-pay | Admitting: Podiatry

## 2014-04-04 ENCOUNTER — Other Ambulatory Visit: Payer: Self-pay | Admitting: *Deleted

## 2014-04-04 MED ORDER — ESOMEPRAZOLE MAGNESIUM 40 MG PO CPDR: 40.0000 mg | DELAYED_RELEASE_CAPSULE | Freq: Every day | ORAL | Status: DC

## 2014-04-04 NOTE — Telephone Encounter (Signed)
Received fax stating esomeprazole 49.3 is no longer available. pls send in rx for esomeprazole 40 mg.../lmb

## 2014-04-22 ENCOUNTER — Encounter: Payer: 59 | Admitting: Internal Medicine

## 2014-04-28 ENCOUNTER — Encounter: Payer: Self-pay | Admitting: Internal Medicine

## 2014-04-28 ENCOUNTER — Other Ambulatory Visit (INDEPENDENT_AMBULATORY_CARE_PROVIDER_SITE_OTHER): Payer: 59

## 2014-04-28 ENCOUNTER — Ambulatory Visit (INDEPENDENT_AMBULATORY_CARE_PROVIDER_SITE_OTHER): Payer: 59 | Admitting: Internal Medicine

## 2014-04-28 VITALS — BP 112/80 | HR 84 | Temp 98.7°F | Ht 70.0 in | Wt 255.1 lb

## 2014-04-28 DIAGNOSIS — R5383 Other fatigue: Secondary | ICD-10-CM

## 2014-04-28 DIAGNOSIS — Z0000 Encounter for general adult medical examination without abnormal findings: Secondary | ICD-10-CM

## 2014-04-28 DIAGNOSIS — R5381 Other malaise: Secondary | ICD-10-CM

## 2014-04-28 DIAGNOSIS — E7800 Pure hypercholesterolemia, unspecified: Secondary | ICD-10-CM

## 2014-04-28 DIAGNOSIS — E669 Obesity, unspecified: Secondary | ICD-10-CM

## 2014-04-28 LAB — LIPID PANEL
CHOLESTEROL: 168 mg/dL (ref 0–200)
HDL: 54.2 mg/dL (ref >39.00)
LDL Cholesterol: 87 mg/dL (ref 0–99)
NonHDL: 113.8
Total CHOL/HDL Ratio: 3
Triglycerides: 136 mg/dL (ref 0.0–149.0)
VLDL: 27.2 mg/dL (ref 0.0–40.0)

## 2014-04-28 LAB — URINALYSIS, ROUTINE W REFLEX MICROSCOPIC
Bilirubin Urine: NEGATIVE
Hgb urine dipstick: NEGATIVE
Ketones, ur: NEGATIVE
LEUKOCYTES UA: NEGATIVE
NITRITE: NEGATIVE
PH: 7 (ref 5.0–8.0)
SPECIFIC GRAVITY, URINE: 1.01 (ref 1.000–1.030)
Total Protein, Urine: NEGATIVE
Urine Glucose: NEGATIVE
Urobilinogen, UA: 0.2 (ref 0.0–1.0)

## 2014-04-28 LAB — BASIC METABOLIC PANEL
BUN: 10 mg/dL (ref 6–23)
CHLORIDE: 107 meq/L (ref 96–112)
CO2: 27 meq/L (ref 19–32)
CREATININE: 0.7 mg/dL (ref 0.4–1.2)
Calcium: 9.7 mg/dL (ref 8.4–10.5)
GFR: 105.05 mL/min (ref >60.00)
GLUCOSE: 80 mg/dL (ref 70–99)
POTASSIUM: 4.2 meq/L (ref 3.5–5.1)
Sodium: 139 mEq/L (ref 135–145)

## 2014-04-28 LAB — HEPATIC FUNCTION PANEL
ALT: 24 U/L (ref 0–35)
AST: 23 U/L (ref 0–37)
Albumin: 3.6 g/dL (ref 3.5–5.2)
Alkaline Phosphatase: 65 U/L (ref 39–117)
Bilirubin, Direct: 0 mg/dL (ref 0.0–0.3)
Total Bilirubin: 0.5 mg/dL (ref 0.2–1.2)
Total Protein: 7.4 g/dL (ref 6.0–8.3)

## 2014-04-28 LAB — CBC WITH DIFFERENTIAL/PLATELET
Basophils Absolute: 0 10*3/uL (ref 0.0–0.1)
Basophils Relative: 0.6 % (ref 0.0–3.0)
EOS PCT: 5.7 % — AB (ref 0.0–5.0)
Eosinophils Absolute: 0.4 10*3/uL (ref 0.0–0.7)
HEMATOCRIT: 40.5 % (ref 36.0–46.0)
Hemoglobin: 13.6 g/dL (ref 12.0–15.0)
LYMPHS ABS: 2.2 10*3/uL (ref 0.7–4.0)
Lymphocytes Relative: 29.3 % (ref 12.0–46.0)
MCHC: 33.6 g/dL (ref 30.0–36.0)
MCV: 84.9 fl (ref 78.0–100.0)
MONO ABS: 0.7 10*3/uL (ref 0.1–1.0)
Monocytes Relative: 9.1 % (ref 3.0–12.0)
Neutro Abs: 4.2 10*3/uL (ref 1.4–7.7)
Neutrophils Relative %: 55.3 % (ref 43.0–77.0)
Platelets: 357 10*3/uL (ref 150.0–400.0)
RBC: 4.77 Mil/uL (ref 3.87–5.11)
RDW: 13.8 % (ref 11.5–15.5)
WBC: 7.6 10*3/uL (ref 4.0–10.5)

## 2014-04-28 LAB — TSH: TSH: 0.89 u[IU]/mL (ref 0.35–4.50)

## 2014-04-28 NOTE — Progress Notes (Signed)
Pre visit review using our clinic review tool, if applicable. No additional management support is needed unless otherwise documented below in the visit note. 

## 2014-04-28 NOTE — Patient Instructions (Addendum)
It was good to see you today.  We have reviewed your prior records including labs and tests today  Health Maintenance reviewed - all recommended immunizations and age-appropriate screenings are up-to-date.  Test(s) ordered today. Your results will be released to [place] (or called to you) after review, usually within 72hours after test completion. If any changes need to be made, you will be notified at that same time.  Medications reviewed and updated, no changes recommended at this time.  we'll make referral for sleep study to look for ?sleep apnea. Our office will contact you regarding appointment(s) once made.  Please schedule followup in 12 months for annual exam and labs, call sooner if problems.  Health Maintenance, Female A healthy lifestyle and preventative care can promote health and wellness.  Maintain regular health, dental, and eye exams.  Eat a healthy diet. Foods like vegetables, fruits, whole grains, low-fat dairy products, and lean protein foods contain the nutrients you need without too many calories. Decrease your intake of foods high in solid fats, added sugars, and salt. Get information about a proper diet from your caregiver, if necessary.  Regular physical exercise is one of the most important things you can do for your health. Most adults should get at least 150 minutes of moderate-intensity exercise (any activity that increases your heart rate and causes you to sweat) each week. In addition, most adults need muscle-strengthening exercises on 2 or more days a week.   Maintain a healthy weight. The body mass index (BMI) is a screening tool to identify possible weight problems. It provides an estimate of body fat based on height and weight. Your caregiver can help determine your BMI, and can help you achieve or maintain a healthy weight. For adults 20 years and older:  A BMI below 18.5 is considered underweight.  A BMI of 18.5 to 24.9 is normal.  A BMI of 25 to 29.9  is considered overweight.  A BMI of 30 and above is considered obese.  Maintain normal blood lipids and cholesterol by exercising and minimizing your intake of saturated fat. Eat a balanced diet with plenty of fruits and vegetables. Blood tests for lipids and cholesterol should begin at age 6 and be repeated every 5 years. If your lipid or cholesterol levels are high, you are over 50, or you are a high risk for heart disease, you may need your cholesterol levels checked more frequently.Ongoing high lipid and cholesterol levels should be treated with medicines if diet and exercise are not effective.  If you smoke, find out from your caregiver how to quit. If you do not use tobacco, do not start.  Lung cancer screening is recommended for adults aged 74 80 years who are at high risk for developing lung cancer because of a history of smoking. Yearly low-dose computed tomography (CT) is recommended for people who have at least a 30-pack-year history of smoking and are a current smoker or have quit within the past 15 years. A pack year of smoking is smoking an average of 1 pack of cigarettes a day for 1 year (for example: 1 pack a day for 30 years or 2 packs a day for 15 years). Yearly screening should continue until the smoker has stopped smoking for at least 15 years. Yearly screening should also be stopped for people who develop a health problem that would prevent them from having lung cancer treatment.  If you are pregnant, do not drink alcohol. If you are breastfeeding, be very cautious about  drinking alcohol. If you are not pregnant and choose to drink alcohol, do not exceed 1 drink per day. One drink is considered to be 12 ounces (355 mL) of beer, 5 ounces (148 mL) of wine, or 1.5 ounces (44 mL) of liquor.  Avoid use of street drugs. Do not share needles with anyone. Ask for help if you need support or instructions about stopping the use of drugs.  High blood pressure causes heart disease and  increases the risk of stroke. Blood pressure should be checked at least every 1 to 2 years. Ongoing high blood pressure should be treated with medicines, if weight loss and exercise are not effective.  If you are 85 to 37 years old, ask your caregiver if you should take aspirin to prevent strokes.  Diabetes screening involves taking a blood sample to check your fasting blood sugar level. This should be done once every 3 years, after age 76, if you are within normal weight and without risk factors for diabetes. Testing should be considered at a younger age or be carried out more frequently if you are overweight and have at least 1 risk factor for diabetes.  Breast cancer screening is essential preventative care for women. You should practice "breast self-awareness." This means understanding the normal appearance and feel of your breasts and may include breast self-examination. Any changes detected, no matter how small, should be reported to a caregiver. Women in their 9s and 30s should have a clinical breast exam (CBE) by a caregiver as part of a regular health exam every 1 to 3 years. After age 55, women should have a CBE every year. Starting at age 4, women should consider having a mammogram (breast X-ray) every year. Women who have a family history of breast cancer should talk to their caregiver about genetic screening. Women at a high risk of breast cancer should talk to their caregiver about having an MRI and a mammogram every year.  Breast cancer gene (BRCA)-related cancer risk assessment is recommended for women who have family members with BRCA-related cancers. BRCA-related cancers include breast, ovarian, tubal, and peritoneal cancers. Having family members with these cancers may be associated with an increased risk for harmful changes (mutations) in the breast cancer genes BRCA1 and BRCA2. Results of the assessment will determine the need for genetic counseling and BRCA1 and BRCA2 testing.  The  Pap test is a screening test for cervical cancer. Women should have a Pap test starting at age 4. Between ages 24 and 52, Pap tests should be repeated every 2 years. Beginning at age 16, you should have a Pap test every 3 years as long as the past 3 Pap tests have been normal. If you had a hysterectomy for a problem that was not cancer or a condition that could lead to cancer, then you no longer need Pap tests. If you are between ages 34 and 33, and you have had normal Pap tests going back 10 years, you no longer need Pap tests. If you have had past treatment for cervical cancer or a condition that could lead to cancer, you need Pap tests and screening for cancer for at least 20 years after your treatment. If Pap tests have been discontinued, risk factors (such as a new sexual partner) need to be reassessed to determine if screening should be resumed. Some women have medical problems that increase the chance of getting cervical cancer. In these cases, your caregiver may recommend more frequent screening and Pap tests.  The  human papillomavirus (HPV) test is an additional test that may be used for cervical cancer screening. The HPV test looks for the virus that can cause the cell changes on the cervix. The cells collected during the Pap test can be tested for HPV. The HPV test could be used to screen women aged 83 years and older, and should be used in women of any age who have unclear Pap test results. After the age of 23, women should have HPV testing at the same frequency as a Pap test.  Colorectal cancer can be detected and often prevented. Most routine colorectal cancer screening begins at the age of 81 and continues through age 64. However, your caregiver may recommend screening at an earlier age if you have risk factors for colon cancer. On a yearly basis, your caregiver may provide home test kits to check for hidden blood in the stool. Use of a small camera at the end of a tube, to directly examine the  colon (sigmoidoscopy or colonoscopy), can detect the earliest forms of colorectal cancer. Talk to your caregiver about this at age 33, when routine screening begins. Direct examination of the colon should be repeated every 5 to 10 years through age 88, unless early forms of pre-cancerous polyps or small growths are found.  Hepatitis C blood testing is recommended for all people born from [birth year] through 1964 and any individual with known risks for hepatitis C.  Practice safe sex. Use condoms and avoid high-risk sexual practices to reduce the spread of sexually transmitted infections (STIs). Sexually active women aged 9 and younger should be checked for Chlamydia, which is a common sexually transmitted infection. Older women with new or multiple partners should also be tested for Chlamydia. Testing for other STIs is recommended if you are sexually active and at increased risk.  Osteoporosis is a disease in which the bones lose minerals and strength with aging. This can result in serious bone fractures. The risk of osteoporosis can be identified using a bone density scan. Women ages 82 and over and women at risk for fractures or osteoporosis should discuss screening with their caregivers. Ask your caregiver whether you should be taking a calcium supplement or vitamin D to reduce the rate of osteoporosis.  Menopause can be associated with physical symptoms and risks. Hormone replacement therapy is available to decrease symptoms and risks. You should talk to your caregiver about whether hormone replacement therapy is right for you.  Use sunscreen. Apply sunscreen liberally and repeatedly throughout the day. You should seek shade when your shadow is shorter than you. Protect yourself by wearing long sleeves, pants, a wide-brimmed hat, and sunglasses year round, whenever you are outdoors.  Notify your caregiver of new moles or changes in moles, especially if there is a change in shape or color. Also notify your  caregiver if a mole is larger than the size of a pencil eraser.  Stay current with your immunizations. Document Released: 05/20/2011 Document Revised: 03/01/2013 Document Reviewed: 05/20/2011 [facility] Patient Information 2014 [place].

## 2014-04-28 NOTE — Assessment & Plan Note (Signed)
Weight trends reviewed Education provided on need to resume healthy lifestyle habits (diet, exercise) and work on weight reduction

## 2014-04-28 NOTE — Progress Notes (Signed)
Subjective:    [name], female    DOB: 21-May-1976, 38 y.o.   MRN: [id number]  HPI  patient is here today for annual physical. Patient feels well and has no complaints.  Also reviewed chronic medical issues and interval medical events  Past Medical History  Diagnosis Date  . ALLERGIC RHINITIS   . ANXIETY DEPRESSION   . CONSTIPATION   . DEPRESSION   . Esophageal reflux   . HYPERCHOLESTEROLEMIA   . OBESITY   . Palpitations   . Occipital neuralgia    Family History  Problem Relation Age of Onset  . Hypertension Mother   . Diabetes Mother   . Hyperlipidemia Mother   . Breast cancer Mother 81  . Osteoarthritis Mother   . Hyperlipidemia Father   . Hyperlipidemia Brother   . Coronary artery disease Father     MI age 82   History  Substance Use Topics  . Smoking status: Never Smoker   . Smokeless tobacco: Not on file     Comment: Married, lives with spouse  . Alcohol Use: No    Review of Systems  Constitutional: Positive for fatigue. Negative for unexpected weight change.  Respiratory: Negative for cough, shortness of breath and wheezing.   Cardiovascular: Negative for chest pain, palpitations and leg swelling.  Gastrointestinal: Negative for nausea, abdominal pain and diarrhea.  Neurological: Negative for dizziness, weakness, light-headedness and headaches.  Psychiatric/Behavioral: Negative for dysphoric mood. The patient is not nervous/anxious.   All other systems reviewed and are negative.      Objective:   Physical Exam  BP 112/80  Pulse 84  Temp(Src) 98.7 F (37.1 C) (Oral)  Ht 5\' 10"  (1.778 m)  Wt 255 lb 1.9 oz (115.722 kg)  BMI 36.61 kg/m2  SpO2 95% Wt Readings from Last 3 Encounters:  04/28/14 255 lb 1.9 oz (115.722 kg)  03/14/14 250 lb (113.399 kg)  04/07/13 248 lb 12.8 oz (112.855 kg)   Constitutional: She is obese, but appears well-developed and well-nourished. No distress.  HENT: Head: Normocephalic and atraumatic. Ears:  B TMs ok, no erythema or effusion; Nose: Nose normal. Mouth/Throat: Oropharynx is clear and moist. No oropharyngeal exudate.  Eyes: Conjunctivae and EOM are normal. Pupils are equal, round, and reactive to light. No scleral icterus.  Neck: Thick, Normal range of motion. Neck supple. No JVD present. No thyromegaly present.  Cardiovascular: Normal rate, regular rhythm and normal heart sounds.  No murmur heard. No BLE edema. Pulmonary/Chest: Effort normal and breath sounds normal. No respiratory distress. She has no wheezes.  Abdominal: Soft. Bowel sounds are normal. She exhibits no distension. There is no tenderness. no masses Musculoskeletal: Normal range of motion, no joint effusions. No gross deformities Neurological: She is alert and oriented to person, place, and time. No cranial nerve deficit. Coordination, balance, strength, speech and gait are normal.  Skin: Skin is warm and dry. No rash noted. No erythema.  Psychiatric: She has a normal mood and affect. Her behavior is normal. Judgment and thought content normal.    Lab Results  Component Value Date   WBC 8.9 04/21/2013   HGB 13.0 04/21/2013   HCT 38.5 04/21/2013   PLT 329.0 04/21/2013   GLUCOSE 114* 04/06/2013   CHOL 173 02/21/2014   TRIG 243* 02/21/2014   HDL 63 02/21/2014   LDLDIRECT 82.1 12/06/2009   LDLCALC 61 02/21/2014   ALT 15 02/21/2014   AST 15 02/21/2014   NA 141 02/21/2014   K 4.2 02/21/2014  CL 107 04/06/2013   CREATININE 0.8 02/21/2014   BUN 13 02/21/2014   CO2 22 04/06/2013   TSH 0.45 04/06/2013   HGBA1C 5.8 04/21/2013    Mr Jodene Nam Head Wo Contrast  11/26/2012   *RADIOLOGY REPORT*  Clinical Data:  Left-sided pain.  Mental status changes.  Acute but ill-defined vascular disease.  MRI HEAD WITHOUT CONTRAST MRA HEAD WITHOUT CONTRAST  Technique:  Multiplanar, multiecho pulse sequences of the brain and surrounding structures were obtained without intravenous contrast. Angiographic images of the head were obtained using MRA technique without contrast.   Comparison:   None  MRI HEAD  Findings:  The brain has a normal appearance on all pulse sequences without evidence of malformation, atrophy, old or acute infarction, mass lesion, hemorrhage, hydrocephalus or extra-axial collection. Mild asymmetry of the lateral ventricles is not pathologic finding. Dilated perivascular spaces at the base of the brain are normal variant.  No pituitary mass.  No fluid in the sinuses, middle ears or mastoids.  No skull or skull base lesion.  IMPRESSION: Normal examination.  No cause of the described symptoms is identified.  MRA HEAD  Findings: Both internal carotid arteries are widely patent into the brain.  The anterior and middle cerebral vessels are patent without proximal stenosis, aneurysm or vascular malformation.  Both vertebral arteries are patent to the basilar.  No basilar stenosis.  Posterior circulation branch vessels appear normal.  IMPRESSION: Normal intracranial MR angiography of the large and medium-sized vessels.   Original Report Authenticated By: Nelson Chimes, M.D.   Mr Brain Wo Contrast  11/26/2012   *RADIOLOGY REPORT*  Clinical Data:  Left-sided pain.  Mental status changes.  Acute but ill-defined vascular disease.  MRI HEAD WITHOUT CONTRAST MRA HEAD WITHOUT CONTRAST  Technique:  Multiplanar, multiecho pulse sequences of the brain and surrounding structures were obtained without intravenous contrast. Angiographic images of the head were obtained using MRA technique without contrast.  Comparison:   None  MRI HEAD  Findings:  The brain has a normal appearance on all pulse sequences without evidence of malformation, atrophy, old or acute infarction, mass lesion, hemorrhage, hydrocephalus or extra-axial collection. Mild asymmetry of the lateral ventricles is not pathologic finding. Dilated perivascular spaces at the base of the brain are normal variant.  No pituitary mass.  No fluid in the sinuses, middle ears or mastoids.  No skull or skull base lesion.  IMPRESSION:  Normal examination.  No cause of the described symptoms is identified.  MRA HEAD  Findings: Both internal carotid arteries are widely patent into the brain.  The anterior and middle cerebral vessels are patent without proximal stenosis, aneurysm or vascular malformation.  Both vertebral arteries are patent to the basilar.  No basilar stenosis.  Posterior circulation branch vessels appear normal.  IMPRESSION: Normal intracranial MR angiography of the large and medium-sized vessels.   Original Report Authenticated By: Nelson Chimes, M.D.       Assessment & Plan:   CPX/v70.0 - Patient has been counseled on age-appropriate routine health concerns for screening and prevention. These are reviewed and up-to-date. Immunizations are up-to-date or declined. Labs ordered and reviewed.  Fatigue - concerning for ?apnea - refer for sleep eval  Problem List Items Addressed This Visit   HYPERCHOLESTEROLEMIA     On simva and fenofib - lipids good The current medical regimen is effective;  continue present plan and medications.  Advised to watch weight and diet/exercise    OBESITY     Weight trends reviewed Education provided  on need to resume healthy lifestyle habits (diet, exercise) and work on weight reduction    Relevant Orders      Ambulatory referral to Pulmonology    Other Visit Diagnoses   Routine general medical examination at a health care facility    -  Primary    Relevant Orders       Basic metabolic panel       CBC with Differential       Hepatic function panel       Lipid panel       TSH       Urinalysis, Routine w reflex microscopic    Fatigue        Relevant Orders       Ambulatory referral to Pulmonology

## 2014-04-28 NOTE — Assessment & Plan Note (Signed)
On simva and fenofib - lipids good The current medical regimen is effective;  continue present plan and medications.  Advised to watch weight and diet/exercise

## 2014-05-03 NOTE — Addendum Note (Signed)
Addended by: Gwendolyn Grant A on: 05/03/2014 12:44 PM   Modules accepted: Orders

## 2014-05-23 ENCOUNTER — Other Ambulatory Visit: Payer: Self-pay | Admitting: Internal Medicine

## 2014-06-20 ENCOUNTER — Other Ambulatory Visit: Payer: Self-pay | Admitting: Internal Medicine

## 2014-06-20 ENCOUNTER — Ambulatory Visit (HOSPITAL_BASED_OUTPATIENT_CLINIC_OR_DEPARTMENT_OTHER): Payer: 59 | Attending: Internal Medicine | Admitting: Radiology

## 2014-06-20 VITALS — Ht 70.0 in | Wt 250.0 lb

## 2014-06-20 DIAGNOSIS — E669 Obesity, unspecified: Secondary | ICD-10-CM | POA: Insufficient documentation

## 2014-06-20 DIAGNOSIS — G4733 Obstructive sleep apnea (adult) (pediatric): Secondary | ICD-10-CM

## 2014-06-20 DIAGNOSIS — Z6835 Body mass index (BMI) 35.0-35.9, adult: Secondary | ICD-10-CM | POA: Insufficient documentation

## 2014-06-20 DIAGNOSIS — G4730 Sleep apnea, unspecified: Secondary | ICD-10-CM | POA: Diagnosis present

## 2014-06-20 DIAGNOSIS — R5383 Other fatigue: Secondary | ICD-10-CM

## 2014-06-20 DIAGNOSIS — G4710 Hypersomnia, unspecified: Secondary | ICD-10-CM | POA: Diagnosis not present

## 2014-06-29 DIAGNOSIS — G4730 Sleep apnea, unspecified: Secondary | ICD-10-CM

## 2014-06-29 DIAGNOSIS — G4710 Hypersomnia, unspecified: Secondary | ICD-10-CM

## 2014-06-29 NOTE — Sleep Study (Signed)
   NAME: April [name] DATE OF BIRTH:  [birth year] MEDICAL RECORD NUMBER [id number]  LOCATION: [facility]  PHYSICIAN: Kathee Delton  DATE OF STUDY: 06/20/2014  SLEEP STUDY TYPE: Nocturnal Polysomnogram               REFERRING PHYSICIAN: Rowe Clack, MD  INDICATION FOR STUDY: Hypersomnia with sleep apnea  EPWORTH SLEEPINESS SCORE:  9 HEIGHT: 5\' 10"  (177.8 cm)  WEIGHT: 250 lb (113.399 kg)    Body mass index is 35.87 kg/(m^2).  NECK SIZE: 14 in.  MEDICATIONS: Reviewed in the sleep record  SLEEP ARCHITECTURE: The patient had a total sleep time of 402 minutes with decreased slow-wave sleep for age, and also decreased REM. Sleep onset latency was normal at 26 minutes, and REM onset was greatly prolonged at 369 minutes. Sleep efficiency was normal at 91%.  RESPIRATORY DATA: The patient was found to have 2 central apneas and only 3 obstructive hypopneas, giving her an AHI of 0.7 events per hour. Her events occurred exclusively during REM, and unfortunately the patient only had one REM period at the very end of the study. The events were not positional, and mild to moderate snoring was noted. The patient did not meet split-night criteria secondary to her small numbers of events.  OXYGEN DATA: There was oxygen desaturation as low as 90% with the patient's events  CARDIAC DATA: No significant arrhythmias were noted.  MOVEMENT/PARASOMNIA: The patient had no significant limb movements or abnormal behaviors seen.  IMPRESSION/ RECOMMENDATION:    1) small numbers of obstructive and central events which do not meet the AHI criteria for the obstructive sleep apnea syndrome. However, her events occurred exclusively during REM, and the patient only had one REM period at the very end of the study. Therefore, her degree of sleep apnea may be underestimated by this study. If she is strongly felt to have obstructive sleep apnea, consideration could be given to a home sleep  test in a more familiar environment without large numbers of monitoring devices, where she may have a more normal quantity of REM.  Clinical correlation is suggested.    [place], American Board of Sleep Medicine  ELECTRONICALLY SIGNED ON:  06/29/2014, 5:13 PM [place] PH: [phone]   FX: [phone] [place]

## 2014-07-01 ENCOUNTER — Encounter: Payer: Self-pay | Admitting: Internal Medicine

## 2014-07-01 ENCOUNTER — Other Ambulatory Visit: Payer: Self-pay | Admitting: Internal Medicine

## 2014-07-18 ENCOUNTER — Encounter: Payer: Self-pay | Admitting: Internal Medicine

## 2014-07-18 DIAGNOSIS — F32A Depression, unspecified: Secondary | ICD-10-CM

## 2014-07-18 DIAGNOSIS — F329 Major depressive disorder, single episode, unspecified: Secondary | ICD-10-CM

## 2014-08-04 ENCOUNTER — Ambulatory Visit (INDEPENDENT_AMBULATORY_CARE_PROVIDER_SITE_OTHER): Payer: 59 | Admitting: Psychology

## 2014-08-04 DIAGNOSIS — F411 Generalized anxiety disorder: Secondary | ICD-10-CM

## 2014-08-15 ENCOUNTER — Ambulatory Visit (INDEPENDENT_AMBULATORY_CARE_PROVIDER_SITE_OTHER): Payer: 59 | Admitting: Psychology

## 2014-08-15 DIAGNOSIS — F411 Generalized anxiety disorder: Secondary | ICD-10-CM

## 2014-08-29 ENCOUNTER — Ambulatory Visit: Payer: 59 | Admitting: Psychology

## 2014-09-02 ENCOUNTER — Other Ambulatory Visit: Payer: Self-pay

## 2014-09-07 ENCOUNTER — Ambulatory Visit (INDEPENDENT_AMBULATORY_CARE_PROVIDER_SITE_OTHER): Payer: 59 | Admitting: Psychology

## 2014-09-07 DIAGNOSIS — F411 Generalized anxiety disorder: Secondary | ICD-10-CM

## 2014-09-23 ENCOUNTER — Ambulatory Visit (INDEPENDENT_AMBULATORY_CARE_PROVIDER_SITE_OTHER): Payer: 59 | Admitting: Psychiatry

## 2014-09-23 DIAGNOSIS — F332 Major depressive disorder, recurrent severe without psychotic features: Secondary | ICD-10-CM

## 2014-09-23 DIAGNOSIS — F331 Major depressive disorder, recurrent, moderate: Secondary | ICD-10-CM

## 2014-09-23 MED ORDER — NORTRIPTYLINE HCL 25 MG PO CAPS: ORAL_CAPSULE | ORAL | Status: DC

## 2014-09-23 NOTE — Progress Notes (Signed)
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 Actual episodes were borderline for major depression. Her typical bouts would have persistent daily depression with a lack of energy problems concentrating and anhedonia. She'll ever never had problems of sleep appetite and was never suicidal. She's never been psychiatrically hospitalized. The patient has been married for 8 years in a stable marriage. She has no children and no intentions to have children. Number of months ago she started feeling more depressed and was really bothered by the tension between her and her husband. Since the patient is been on Lexapro she lacks a drive for sexual activity and inability to have an orgasm. Presently the patient is in a stable job that she likes and likes her peers. Her her supervisor is making increasing demands. She does believe her job is secure but doesn't pay as well as it should. At this time the patient denies persistent daily depression. She is sleeping and eating well. She's got good energy and good concentration. She denies worthlessness. She enjoys 2 dogs 2 cats reads watches TV. She's not suicidal now or never has been. A few months ago when she went to see her primary care doctor one of the things that started with psychotherapy with Dr. Cheryln Manly which does seem to be very helpful. The patient is here he could she like to improve her medication treatment and hopefully get back her sex drive and sexual functioning.  The patient denies the use of alcohol or drugs. She denies any psychotic symptoms at all. Her last episode of clinical depression was in 2004 again characterized by daily depression with normal sleep and appetite but a reduction in energy  and concentration. She also had some anhedonia. The patient denies ever having a manic episode. She denies any specific symptoms consistent with generalized anxiety disorder panic disorder or obsessive-compulsive disorder. The patient started off with Celexa which she herself stopped. At some point she was changed to Lexapro and then efforts were made to reduce her problems with sex drive by adding Wellbutrin which did not work. She also trawled Effexor caused too many side effects. This patient has never been in a psychiatric hospital. The patient does not smoke cigarettes and has above a college degree. The patient experienced no physical or sexual abuse as a child. Medically she is very stable. She does have some well-controlled asthma and hypercholesterolemia History of Chief Complaint:  No chief complaint on file.   HPI Review of Systems Physical Exam  Depressive Symptoms: decreased labido,  (Hypo) Manic Symptoms:   Elevated Mood:  No Irritable Mood:  No Grandiosity:  No Distractibility:  No Labiality of Mood:  No Delusions:  No Hallucinations:  No Impulsivity:  No Sexually Inappropriate Behavior:  No Financial Extravagance:  No Flight of Ideas:  No  Anxiety Symptoms: Excessive Worry:  No Panic Symptoms:  No Agoraphobia:  No Obsessive Compulsive: No  Symptoms: None, Specific Phobias:  No Social Anxiety:  No  Psychotic Symptoms:  Hallucinations: No None Delusions:  No Paranoia:  No   Ideas of Reference:  No  PTSD Symptoms: Ever had a traumatic exposure:  No Had a traumatic exposure in the last month:  No Re-experiencing:   Hypervigilance:   Hyperarousal:   Avoidance:  Traumatic Brain Injury: No   Past Psychiatric History: Diagnosis: Major depression  Hospitalizations: no  Outpatient Care: no  Substance Abuse Care: no  Self-Mutilation: no  Suicidal Attempts: no  Violent Behaviors: no   Past Medical History:   Past Medical History  Diagnosis Date  .  ALLERGIC RHINITIS   . ANXIETY DEPRESSION   . CONSTIPATION   . DEPRESSION   . Esophageal reflux   . HYPERCHOLESTEROLEMIA   . OBESITY   . Palpitations   . Occipital neuralgia    History of Loss of Consciousness:  No Seizure History:  No Cardiac History:  No Allergies:  No Known Allergies Current Medications:  Current Outpatient Prescriptions  Medication Sig Dispense Refill  . albuterol (PROAIR HFA) 108 (90 BASE) MCG/ACT inhaler Inhale 2 puffs into the lungs every 6 (six) hours as needed for wheezing or shortness of breath. 1 Inhaler 3  . cetirizine (ZYRTEC) 10 MG tablet Take 10 mg by mouth daily.      Marland Kitchen escitalopram (LEXAPRO) 20 MG tablet TAKE 1 TABLET BY MOUTH DAILY. 90 tablet 3  . esomeprazole (NEXIUM) 40 MG capsule TAKE 1 CAPSULE BY MOUTH DAILY. 90 capsule 3  . etonogestrel-ethinyl estradiol (NUVARING) 0.12-0.015 MG/24HR vaginal ring Place 1 each vaginally every 28 (twenty-eight) days. Insert vaginally and leave in place for 3 consecutive weeks, then remove for 1 week.     . fenofibrate micronized (LOFIBRA) 67 MG capsule TAKE 1 CAPSULE BY MOUTH DAILY BEFORE BREAKFAST. 90 capsule 3  . fluticasone (FLONASE) 50 MCG/ACT nasal spray Place 2 sprays into the nose 2 (two) times daily as needed.    . gabapentin (NEURONTIN) 600 MG tablet Take 600-900 mg by mouth 3 (three) times daily. 900mg  in the am, 600mg  midday, and 900mg  at hs    . montelukast (SINGULAIR) 10 MG tablet TAKE 1 TABLET BY MOUTH AT BEDTIME. 90 tablet 3  . Multiple Vitamin (MULTIVITAMIN) tablet Take 1 tablet by mouth daily.      . nortriptyline (PAMELOR) 25 MG capsule 1 qhs  For 4 days then 2  qhs  For 4 days then 3 qhs 90 capsule 5  . olopatadine (PATANOL) 0.1 % ophthalmic solution Place 1 drop into both eyes 2 (two) times daily. 5 mL 3  . Omega-3 Fatty Acids (FISH OIL TRIPLE STRENGTH) 1400 MG CAPS Take 1 capsule by mouth 2 (two) times daily.    . simvastatin (ZOCOR) 40 MG tablet TAKE 1 TABLET BY MOUTH AT BEDTIME. 90 tablet 3  .  vitamin C (ASCORBIC ACID) 500 MG tablet Take 500 mg by mouth daily.       No current facility-administered medications for this visit.    Previous Psychotropic Medications:  Medication Dose   Lexapro  20 mg                     Substance Abuse History in the last 12 months: Substance Age of 1st Use Last Use Amount Specific Type   Medical Consequences of Substance Abuse:   Legal Consequences of Substance Abuse:   Family Consequences of Substance Abuse:   Blackouts:   DT's:   Withdrawal Symptoms:     Social History: Current Place of Residence:  Place of Birth:  Family Members:  Marital Status:  Married Children: 0  Sons:   Daughters:  Relationships:  Education:  Soil scientist Problems/Performance:  Religious Beliefs/Practices:  History of Abuse:  Ship broker History:   Legal History:  Hobbies/Interests:   Family History:  Family History  Problem Relation Age of Onset  . Hypertension Mother   . Diabetes Mother   . Hyperlipidemia Mother   . Breast cancer Mother 23  . Osteoarthritis Mother   . Hyperlipidemia Father   . Hyperlipidemia Brother   . Coronary artery disease Father     MI age 55    Mental Status Examination/Evaluation: Objective:  Appearance: Casual  Eye Contact::  Good  Speech:  Clear and Coherent  Volume:  Normal  Mood:  nl  Affect:    Thought Process:  Coherent  Orientation:  Full (Time, Place, and Person)  Thought Content:  NA and WDL  Suicidal Thoughts:  No  Homicidal Thoughts:  No  Judgement:  Good  Insight:  Good  Psychomotor Activity:  Normal  Akathisia:  No  Handed:  Right  AIMS (if indicated):    Assets:      Laboratory/X-Ray Psychological Evaluation(s)        Assessment:  Axis I: Major Depression, Recurrent severe  AXIS I Major Depression, Recurrent severe  AXIS II Deferred  AXIS III Past Medical History  Diagnosis Date  . ALLERGIC RHINITIS   . ANXIETY DEPRESSION   .  CONSTIPATION   . DEPRESSION   . Esophageal reflux   . HYPERCHOLESTEROLEMIA   . OBESITY   . Palpitations   . Occipital neuralgia      AXIS IV problems with primary support group  AXIS V 61-70 mild symptoms   Treatment Plan/Recommendations:  Plan of Care: at this time the patient will reduce her Lexapro to take 10 mgfor 2 weeks and then discontinue it. At the same time she she'll begin on nortriptyline 25 mg one at night for 4 nights and 2 at night for 4 nights and then 3 every night. The patient will continue in psychotherapy with Dr. Cheryln Manly. This patient to return to see me in 7 weeks.  Laboratory:    Psychotherapy:   Medications: Lexapro 20 mg  Routine PRN Medications:    Consultations:   Safety Concerns:    Other:      Mohini Heathcock, Charlestine Night, MD 11/6/201510:21 AM

## 2014-09-29 ENCOUNTER — Ambulatory Visit: Payer: 59 | Admitting: Psychology

## 2014-10-03 ENCOUNTER — Ambulatory Visit (INDEPENDENT_AMBULATORY_CARE_PROVIDER_SITE_OTHER): Payer: 59 | Admitting: Psychology

## 2014-10-03 DIAGNOSIS — F332 Major depressive disorder, recurrent severe without psychotic features: Secondary | ICD-10-CM

## 2014-11-09 ENCOUNTER — Ambulatory Visit (HOSPITAL_COMMUNITY): Payer: Self-pay | Admitting: Psychiatry

## 2014-11-14 ENCOUNTER — Ambulatory Visit (INDEPENDENT_AMBULATORY_CARE_PROVIDER_SITE_OTHER): Payer: 59 | Admitting: Psychology

## 2014-11-14 DIAGNOSIS — F411 Generalized anxiety disorder: Secondary | ICD-10-CM

## 2014-11-23 ENCOUNTER — Ambulatory Visit (INDEPENDENT_AMBULATORY_CARE_PROVIDER_SITE_OTHER)
Admission: RE | Admit: 2014-11-23 | Discharge: 2014-11-23 | Disposition: A | Discharge status: Home / Self Care | Payer: 59 | Source: Ambulatory Visit | Attending: Internal Medicine | Admitting: Internal Medicine

## 2014-11-23 ENCOUNTER — Encounter: Payer: Self-pay | Admitting: Internal Medicine

## 2014-11-23 ENCOUNTER — Ambulatory Visit (INDEPENDENT_AMBULATORY_CARE_PROVIDER_SITE_OTHER): Payer: 59 | Admitting: Internal Medicine

## 2014-11-23 ENCOUNTER — Ambulatory Visit (HOSPITAL_COMMUNITY): Payer: Self-pay | Admitting: Psychiatry

## 2014-11-23 VITALS — BP 120/70 | HR 79 | Temp 97.5°F | Ht 70.0 in | Wt 235.5 lb

## 2014-11-23 DIAGNOSIS — M545 Low back pain, unspecified: Secondary | ICD-10-CM

## 2014-11-23 MED ORDER — PREDNISONE 20 MG PO TABS: 20.0000 mg | ORAL_TABLET | Freq: Every day | ORAL | Status: DC

## 2014-11-23 MED ORDER — IBUPROFEN 600 MG PO TABS: 600.0000 mg | ORAL_TABLET | Freq: Four times a day (QID) | ORAL | Status: DC | PRN

## 2014-11-23 MED ORDER — CYCLOBENZAPRINE HCL 5 MG PO TABS: 5.0000 mg | ORAL_TABLET | Freq: Three times a day (TID) | ORAL | Status: DC | PRN

## 2014-11-23 NOTE — Progress Notes (Signed)
Subjective:    [name], female    DOB: Jan 13, 1976, 39 y.o.   MRN: [id number]  HPI  Here after onset midline LBP since dec 27, started with bending to pick up trash, tyring to stand up and caught her, constant since, but worse in AM to get OOB, some better during the day, some better with ibuprofen. Pt continues to have midlinevery lower LBP, nobowel or bladder change, fever, wt loss,  worsening LE pain/numbness/weakness, gait change or falls.  Very stiff and painfull in the am, better after up for up fo rapprox 1 hr, but actually seems more severe during the days in last few days.  Has to actually roll out of bed - pain 9/10 in am, during 4/10, ibuprofen helped some  No prior hx of same  No missed work. Past Medical History  Diagnosis Date  . ALLERGIC RHINITIS   . ANXIETY DEPRESSION   . CONSTIPATION   . DEPRESSION   . Esophageal reflux   . HYPERCHOLESTEROLEMIA   . OBESITY   . Palpitations   . Occipital neuralgia    Past Surgical History  Procedure Laterality Date  . No past surgeries      reports that she has never smoked. She does not have any smokeless tobacco history on file. She reports that she does not drink alcohol or use illicit drugs. family history includes Breast cancer (age of onset: 8) in her mother; Coronary artery disease in her father; Diabetes in her mother; Hyperlipidemia in her brother, father, and mother; Hypertension in her mother; Osteoarthritis in her mother. No Known Allergies Current Outpatient Prescriptions on File Prior to Visit  Medication Sig Dispense Refill  . albuterol (PROAIR HFA) 108 (90 BASE) MCG/ACT inhaler Inhale 2 puffs into the lungs every 6 (six) hours as needed for wheezing or shortness of breath. 1 Inhaler 3  . cetirizine (ZYRTEC) 10 MG tablet Take 10 mg by mouth daily.      Marland Kitchen escitalopram (LEXAPRO) 20 MG tablet TAKE 1 TABLET BY MOUTH DAILY. 90 tablet 3  . esomeprazole (NEXIUM) 40 MG capsule TAKE 1 CAPSULE BY MOUTH DAILY.  90 capsule 3  . etonogestrel-ethinyl estradiol (NUVARING) 0.12-0.015 MG/24HR vaginal ring Place 1 each vaginally every 28 (twenty-eight) days. Insert vaginally and leave in place for 3 consecutive weeks, then remove for 1 week.     . fenofibrate micronized (LOFIBRA) 67 MG capsule TAKE 1 CAPSULE BY MOUTH DAILY BEFORE BREAKFAST. 90 capsule 3  . fluticasone (FLONASE) 50 MCG/ACT nasal spray Place 2 sprays into the nose 2 (two) times daily as needed.    . gabapentin (NEURONTIN) 600 MG tablet Take 900 mg by mouth  BID    . montelukast (SINGULAIR) 10 MG tablet TAKE 1 TABLET BY MOUTH AT BEDTIME. 90 tablet 3  . Multiple Vitamin (MULTIVITAMIN) tablet Take 1 tablet by mouth daily.      Marland Kitchen olopatadine (PATANOL) 0.1 % ophthalmic solution Place 1 drop into both eyes 2 (two) times daily. 5 mL 3  . Omega-3 Fatty Acids (FISH OIL TRIPLE STRENGTH) 1400 MG CAPS Take 1 capsule by mouth 2 (two) times daily.    . simvastatin (ZOCOR) 40 MG tablet TAKE 1 TABLET BY MOUTH AT BEDTIME. 90 tablet 3  . vitamin C (ASCORBIC ACID) 500 MG tablet Take 500 mg by mouth daily.       No current facility-administered medications on file prior to visit.   Review of Systems  Constitutional: Negative for unusual diaphoresis or other sweats  HENT: Negative for ringing in ear Eyes: Negative for double vision or worsening visual disturbance.  Respiratory: Negative for choking and stridor.   Gastrointestinal: Negative for vomiting or other signifcant bowel change Genitourinary: Negative for hematuria or decreased urine volume.  Musculoskeletal: Negative for other MSK pain or swelling Skin: Negative for color change and worsening wound.  Neurological: Negative for tremors and numbness other than noted  Psychiatric/Behavioral: Negative for decreased concentration or agitation other than above       Objective:   Physical Exam BP 120/70 mmHg  Pulse 79  Temp(Src) 97.5 F (36.4 C) (Oral)  Ht 5\' 10"  (1.778 m)  Wt 235 lb 8 oz (106.822 kg)   BMI 33.79 kg/m2  SpO2 97%  LMP 11/09/2014 (Approximate)  VS noted,  Constitutional: Pt appears well-developed, well-nourished.  HENT: Head: NCAT.  Right Ear: External ear normal.  Left Ear: External ear normal.  Eyes: . Pupils are equal, round, and reactive to light. Conjunctivae and EOM are normal Neck: Normal range of motion. Neck supple.  Cardiovascular: Normal rate and regular rhythm.   Pulmonary/Chest: Effort normal and breath sounds without rales or wheezing.  Abd:  Soft, NT, ND, + BS Neurological: Pt is alert. Not confused , motor intact, sens/dtr/gait intact Spine nontender Skin: Skin is warm. No rash Psychiatric: Pt behavior is normal. No agitation.      Assessment & Plan:

## 2014-11-23 NOTE — Assessment & Plan Note (Signed)
Mild to mod, no neuro change, for pain control - ibuprofen, predpac asd, trial muscle relaxer prn,  to f/u any worsening symptoms or concerns

## 2014-11-23 NOTE — Patient Instructions (Signed)
Please take all new medication as prescribed - the ibuprofen, muscle relaxer, and prednisone  The [facility] outpatient pharmacy closes at 6 PM (I think)  Please go to the XRAY Department in the Basement (go straight as you get off the elevator) for the x-ray testing  You will be contacted by phone if any changes need to be made immediately.  Otherwise, you will receive a letter about your results with an explanation, but please check with MyChart first.

## 2014-11-23 NOTE — Progress Notes (Signed)
Pre visit review using our clinic review tool, if applicable. No additional management support is needed unless otherwise documented below in the visit note. 

## 2014-12-02 ENCOUNTER — Ambulatory Visit (INDEPENDENT_AMBULATORY_CARE_PROVIDER_SITE_OTHER): Payer: 59 | Admitting: Psychiatry

## 2014-12-02 VITALS — BP 143/82 | HR 85 | Ht 70.0 in | Wt 235.8 lb

## 2014-12-02 DIAGNOSIS — F331 Major depressive disorder, recurrent, moderate: Secondary | ICD-10-CM

## 2014-12-02 DIAGNOSIS — F332 Major depressive disorder, recurrent severe without psychotic features: Secondary | ICD-10-CM

## 2014-12-02 MED ORDER — VORTIOXETINE HBR 5 MG PO TABS: 5.0000 mg | ORAL_TABLET | Freq: Two times a day (BID) | ORAL | Status: DC

## 2014-12-02 NOTE — Progress Notes (Signed)
[facility] MD Progress Note  12/02/2014 10:00 AM April [name]  MRN:  [id number] Subjective:  Disturbed On her last visit we made an attempt to take her off her Lexapro because its effects on her sex drive. She was transferred to nortriptyline but within a few weeks she felt very irritable very tense and clearly missed her SSRI. She said she bark and was angry. Certainly this did not help her sex drive. When apparently he did do his it showed how important an SSRI might be for her but I suspect there is a lot of psychological features as well. This patient is really bothered that she does not have a sex drive. Without a sex drive I think this patient's ability to have an orgasm is lessened. This is creating an ongoing conflict with her husband is very interested in a sex life with her. They've been married 8 years and in every other way too well. 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 It should be noted that she tried Celexa in the past and had the same problems and Wellbutrin had no benefits. Today we will go ahead and begin her on Brentillix 10 mg. The patient is very willing to give this a try. We will go somewhat slow in that we'll give her at least 2-1/2 months before I see her again. In every other way she sleeping and eating well. She does not use alcohol or drugs. She is not psychotic. She is not suicidal. Being back on Lexapro she's back to her baseline. But her baseline is not adequate in that she has a reduced sex drive. The possibility that this is just not related to medications will be discussed on her next visit if in fact she doesn't feel a benefit from the Brintellix. The dose to 10 mg should be adequate to control her depression but if her depression would recur I would go ahead and increase it to 20. Diagnosis:   DSM5: Schizophrenia Disorders:   Obsessive-Compulsive Disorders:   Trauma-Stressor Disorders:   Substance/Addictive Disorders:   Depressive Disorders:   Major Depressive Disorder - Mild (296.21) Total Time spent with patient: 30 minutes  Axis I: Major Depression, Recurrent severe  ADL's:  Intact  Sleep: Good  Appetite:  Good  Suicidal Ideation:  no Homicidal Ideation:  none AEB (as evidenced by):  Psychiatric Specialty Exam: Physical Exam  ROS  Blood pressure 143/82, pulse 85, height 5\' 10"  (1.778 m), weight 235 lb 12.8 oz (106.958 kg), last menstrual period 11/09/2014.Body mass index is 33.83 kg/(m^2).  General Appearance: Casual  Eye Contact::  Good  Speech:  Clear and Coherent  Volume:  Normal  Mood:  NA  Affect:  Appropriate  Thought Process:  Goal Directed  Orientation:  Full (Time, Place, and Person)  Thought Content:  WDL  Suicidal Thoughts:  No  Homicidal Thoughts:  No  Memory:  NA  Judgement:  Good  Insight:  Good  Psychomotor Activity:  Normal  Concentration:  Good  Recall:  Good  Fund of Knowledge:Good  Language: Good  Akathisia:  No  Handed:  Right  AIMS (if indicated):     Assets:  Desire for Improvement  Sleep:      Musculoskeletal: Strength & Muscle Tone:  Gait & Station:  Patient leans:   Current Medications: Current Outpatient Prescriptions  Medication Sig Dispense Refill  . albuterol (PROAIR HFA) 108 (90 BASE) MCG/ACT inhaler Inhale 2 puffs into the lungs every 6 (six) hours as  needed for wheezing or shortness of breath. 1 Inhaler 3  . cetirizine (ZYRTEC) 10 MG tablet Take 10 mg by mouth daily.      . cyclobenzaprine (FLEXERIL) 5 MG tablet Take 1 tablet (5 mg total) by mouth 3 (three) times daily as needed for muscle spasms. 60 tablet 1  . escitalopram (LEXAPRO) 20 MG tablet TAKE 1 TABLET BY MOUTH DAILY. 90 tablet 3  . esomeprazole (NEXIUM) 40 MG capsule TAKE 1 CAPSULE BY MOUTH DAILY. 90 capsule 3  . etonogestrel-ethinyl estradiol (NUVARING) 0.12-0.015 MG/24HR vaginal ring Place 1 each vaginally every 28 (twenty-eight) days. Insert vaginally and leave in place for 3 consecutive weeks, then  remove for 1 week.     . fenofibrate micronized (LOFIBRA) 67 MG capsule TAKE 1 CAPSULE BY MOUTH DAILY BEFORE BREAKFAST. 90 capsule 3  . fluticasone (FLONASE) 50 MCG/ACT nasal spray Place 2 sprays into the nose 2 (two) times daily as needed.    . gabapentin (NEURONTIN) 600 MG tablet Take 900 mg by mouth  BID    . ibuprofen (ADVIL,MOTRIN) 600 MG tablet Take 1 tablet (600 mg total) by mouth every 6 (six) hours as needed. 60 tablet 0  . montelukast (SINGULAIR) 10 MG tablet TAKE 1 TABLET BY MOUTH AT BEDTIME. 90 tablet 3  . Multiple Vitamin (MULTIVITAMIN) tablet Take 1 tablet by mouth daily.      Marland Kitchen olopatadine (PATANOL) 0.1 % ophthalmic solution Place 1 drop into both eyes 2 (two) times daily. 5 mL 3  . Omega-3 Fatty Acids (FISH OIL TRIPLE STRENGTH) 1400 MG CAPS Take 1 capsule by mouth 2 (two) times daily.    . predniSONE (DELTASONE) 20 MG tablet Take 1 tablet (20 mg total) by mouth daily with breakfast. 5 tablet 0  . simvastatin (ZOCOR) 40 MG tablet TAKE 1 TABLET BY MOUTH AT BEDTIME. 90 tablet 3  . vitamin C (ASCORBIC ACID) 500 MG tablet Take 500 mg by mouth daily.      . Vortioxetine HBr (BRINTELLIX) 5 MG TABS Take 1 tablet (5 mg total) by mouth 2 (two) times daily. 60 tablet 3   No current facility-administered medications for this visit.    Lab Results: No results found for this or any previous visit (from the past 48 hour(s)).  Physical Findings: AIMS:  , ,  ,  ,    CIWA:    COWS:     Treatment Plan Summary: At this time the patient will reduce her Lexapro from 20 mg to dose of 10 mg for the next 6 days. At the same time that she's doing that she'll begin on Brentillix 5 mg. She'll take the 5 mg for one week and then double it to a full dose of 10 mg. This patient to return to see me in 9 weeks. The patient was told to call if there are any problems. At this time the patient is very emotionally stable. Her outcome is improved sex drive in woman whose depression is reduced/absence.    Plan:  Medical Decision Making Problem Points:  Established problem, worsening (2) Data Points:  Review of medication regiment & side effects (2)  I certify that inpatient services furnished can reasonably be expected to improve the patient's condition.   Lenda Baratta, [place] 12/02/2014, 10:00 AM

## 2014-12-19 ENCOUNTER — Telehealth (HOSPITAL_COMMUNITY): Payer: Self-pay

## 2014-12-19 NOTE — Telephone Encounter (Signed)
Patient called requesting an increase in Brintellix to 20mg  daily as states not feeling the current total of 10mg  daily is doing enough for her symptoms of depression.  No suicidal or homicidal ideations expressed but states she thinks the decrease in Lexapro and change to Brintellix is not a enough to effectively manage symptoms.  Would like Dr. Casimiro Needle to consider increase when he is in the office on 12/20/14.  Next evaluation set for 02/08/15.

## 2014-12-21 MED ORDER — VORTIOXETINE HBR 20 MG PO TABS: 20.0000 mg | ORAL_TABLET | Freq: Every morning | ORAL | Status: DC

## 2014-12-22 ENCOUNTER — Encounter: Payer: Self-pay | Admitting: Internal Medicine

## 2015-01-06 ENCOUNTER — Ambulatory Visit (INDEPENDENT_AMBULATORY_CARE_PROVIDER_SITE_OTHER): Payer: 59 | Admitting: Psychology

## 2015-01-06 DIAGNOSIS — F411 Generalized anxiety disorder: Secondary | ICD-10-CM

## 2015-01-11 ENCOUNTER — Encounter: Payer: Self-pay | Admitting: Internal Medicine

## 2015-01-11 ENCOUNTER — Other Ambulatory Visit: Payer: Self-pay

## 2015-01-11 MED ORDER — FLUTICASONE PROPIONATE 50 MCG/ACT NA SUSP: 2.0000 | Freq: Two times a day (BID) | NASAL | Status: DC | PRN

## 2015-01-16 ENCOUNTER — Telehealth (HOSPITAL_COMMUNITY): Payer: Self-pay | Admitting: *Deleted

## 2015-01-16 NOTE — Telephone Encounter (Signed)
Called patient back and left message on voice mail for return call back.  Message was left on Friday, patient had questions about Brintellix. Unsure if questions patient had were addressed, will wait on call back.

## 2015-01-23 ENCOUNTER — Encounter: Payer: Self-pay | Admitting: Internal Medicine

## 2015-01-24 ENCOUNTER — Other Ambulatory Visit: Payer: Self-pay | Admitting: *Deleted

## 2015-01-24 ENCOUNTER — Encounter: Payer: Self-pay | Admitting: Family Medicine

## 2015-01-24 ENCOUNTER — Other Ambulatory Visit (INDEPENDENT_AMBULATORY_CARE_PROVIDER_SITE_OTHER): Payer: 59

## 2015-01-24 ENCOUNTER — Telehealth (HOSPITAL_COMMUNITY): Payer: Self-pay | Admitting: *Deleted

## 2015-01-24 ENCOUNTER — Ambulatory Visit (INDEPENDENT_AMBULATORY_CARE_PROVIDER_SITE_OTHER): Payer: 59 | Admitting: Family Medicine

## 2015-01-24 VITALS — BP 154/86 | HR 89 | Ht 70.0 in | Wt 230.0 lb

## 2015-01-24 DIAGNOSIS — M25562 Pain in left knee: Secondary | ICD-10-CM | POA: Diagnosis not present

## 2015-01-24 DIAGNOSIS — S76302A Unspecified injury of muscle, fascia and tendon of the posterior muscle group at thigh level, left thigh, initial encounter: Secondary | ICD-10-CM | POA: Diagnosis not present

## 2015-01-24 MED ORDER — DICLOFENAC SODIUM 2 % TD SOLN: TRANSDERMAL | Status: DC

## 2015-01-24 NOTE — Telephone Encounter (Signed)
Refill done.  

## 2015-01-24 NOTE — Telephone Encounter (Signed)
Patient left message on nurse's voice mail stating since starting Brintellix she has nausea, vomiting and some anxiety, request call back.  I called patient back. Patient stated she stopped Brintellix because she was having nausea, vomiting, and she feels anxious. Stopped Brintellix yesterday. Patient stated she started taking Lexapro again.  Patient stated her depression is better, no thoughts of hurting herself or anyone else.  Patient just wants to know how long nausea, vomiting, and anxiety will last.  I advised patient I will call Dr. Casimiro Needle and call her back.  I called Dr. Casimiro Needle and per Dr. Casimiro Needle nausea and vomiting will happen-side effect to medication.  I advised Dr. Casimiro Needle patient feels anxious but depression is better because she started taking Lexapro again. I advised Dr. Casimiro Needle patient has no thoughts of hurting herself or anyone else. Dr. Casimiro Needle advised that the symptoms last 2-3 days and then should be out of her system.  Have patient call with an update on Friday.   Called patient back.  Advised patient per Dr. Casimiro Needle symptoms can last 2-3 days and then get better.  Advised patient to stay off Brintellix and continue Lexapro since it is helping.  I advised patient to call back on Friday with an update on how she is feeling. I advised patient if symptoms get worse or any thoughts of hurting herself or anyone else to please go directly to ER.  Patient verbalized understanding.

## 2015-01-24 NOTE — Patient Instructions (Addendum)
Good to see you.   Ice 20 minutes 2 times daily. Usually after activity and before bed. Shorter strides on treadmill Exercises 3 times a week.  Compression sleeve can help Try pennsaid twice daily and call number and they will ship you a big tube for cheap Consider Vitamin D 2000 IU daily Turmeric 500mg  twice daily but it may affect your GERD See me again in 3 weeks.

## 2015-01-24 NOTE — Assessment & Plan Note (Signed)
Patient is more of a distal strain than anything. I do not see any true tear on ultrasound today. We discussed icing, home exercises, We discussing compression sleeve that can be beneficial as well. Patient did work with Product/process development scientist today.  Patient will try this conservative therapy and come back and see me again in 2-3 weeks. Depending on patient on how she responds we may need to consider decreasing her activity or further imaging but I do not think that this will be likelihood. Differential also includes proximal gastrocnemius strain but the radicular symptoms of the leg seems to be abnormal. The other thing could be piriformis syndrome patient has a negative Corky Sox test today.

## 2015-01-24 NOTE — Progress Notes (Signed)
Pre visit review using our clinic review tool, if applicable. No additional management support is needed unless otherwise documented below in the visit note. 

## 2015-01-24 NOTE — Progress Notes (Signed)
[name] [address] [place], [place] [address] Phone: [phone] Subjective:    I'm seeing this patient by the request  of:  Gwendolyn Grant, MD  CC: left knee pain  [id number] April [name] is a 39 y.o. female coming in with complaint of left knee pain. Patient sees that this is been going on for about 2-3 months. Patient has increased her activity and try to walk on her treadmill multiple days a week. Patient states that it seems to hurt in the posterior aspect of her knee and seems to radiate up towards her buttock area. Patient denies any numbness or detailing. States though that he can be very stiff after sitting a long amount of time. Patient rates the severity of 6 out of 10. Patient has not tried any significant home modalities. Denies any back pain that is associated with it. Patient would like to be more active but finds it very difficult. Denies any nighttime awakening.     Past medical history, social, surgical and family history all reviewed in electronic medical record.   Review of Systems: No headache, visual changes, nausea, vomiting, diarrhea, constipation, dizziness, abdominal pain, skin rash, fevers, chills, night sweats, weight loss, swollen lymph nodes, body aches, joint swelling, muscle aches, chest pain, shortness of breath, mood changes.   Objective Blood pressure 154/86, pulse 89, height 5\' 10"  (1.778 m), weight 230 lb (104.327 kg), SpO2 97 %.  General: No apparent distress alert and oriented x3 mood and affect normal, dressed appropriately.  HEENT: Pupils equal, extraocular movements intact  Respiratory: Patient's speak in full sentences and does not appear short of breath  Cardiovascular: No lower extremity edema, non tender, no erythema  Skin: Warm dry intact with no signs of infection or rash on extremities or on axial skeleton.  Abdomen: Soft nontender  Neuro: Cranial nerves II through XII are intact,  neurovascularly intact in all extremities with 2+ DTRs and 2+ pulses.  Lymph: No lymphadenopathy of posterior or anterior cervical chain or axillae bilaterally.  Gait normal with good balance and coordination.  MSK:  Non tender with full range of motion and good stability and symmetric strength and tone of shoulders, elbows, wrist, hip, and ankles bilaterally.  Knee:left Normal to inspection with no erythema or effusion or obvious bony abnormalities. Mild tenderness to palpation of the distal hamstring otherwise unremarkable ROM full in flexion and extension and lower leg rotation. Ligaments with solid consistent endpoints including ACL, PCL, LCL, MCL. Negative Mcmurray's, Apley's, and Thessalonian tests. Non painful patellar compression. Patellar glide without crepitus. Patellar and quadriceps tendons unremarkable. Hamstring and quadriceps strength is normal.  Contralateral knee unremarkable  MSK US performed OY:DXAJ knee This study was ordered, performed, and interpreted by Charlann Boxer D.O.  Knee: All structures visualized. Anteromedial, anterolateral, posteromedial, and posterolateral menisci unremarkable without tearing, fraying, effusion, or displacement. Patellar Tendon unremarkable on long and transverse views without effusion. No abnormality of prepatellar bursa. LCL and MCL unremarkable on long and transverse views. No abnormality of origin of medial or lateral head of the gastrocnemius.  IMPRESSION:  NORMAL ULTRASONOGRAPHIC EXAMINATION OF THE KNEE.  Procedure note 28786; 15 minutes spent for Therapeutic exercises as stated in above notes.  This included exercises focusing on stretching, strengthening, with significant focus on eccentric aspects.   Proper technique shown and discussed handout in great detail with ATC.  All questions were discussed and answered.      Impression and Recommendations:     This  case required medical decision making of moderate  complexity.

## 2015-02-08 ENCOUNTER — Ambulatory Visit (INDEPENDENT_AMBULATORY_CARE_PROVIDER_SITE_OTHER): Payer: 59 | Admitting: Psychiatry

## 2015-02-08 VITALS — BP 128/79 | HR 80 | Ht 70.0 in | Wt 236.6 lb

## 2015-02-08 DIAGNOSIS — F329 Major depressive disorder, single episode, unspecified: Secondary | ICD-10-CM

## 2015-02-08 DIAGNOSIS — F331 Major depressive disorder, recurrent, moderate: Secondary | ICD-10-CM

## 2015-02-08 MED ORDER — ESCITALOPRAM OXALATE 20 MG PO TABS: 20.0000 mg | ORAL_TABLET | Freq: Every day | ORAL | Status: DC

## 2015-02-08 MED ORDER — ZOLPIDEM TARTRATE 10 MG PO TABS: 10.0000 mg | ORAL_TABLET | Freq: Every evening | ORAL | Status: DC | PRN

## 2015-02-08 MED ORDER — CLONAZEPAM 0.5 MG PO TABS: 0.5000 mg | ORAL_TABLET | Freq: Two times a day (BID) | ORAL | Status: DC | PRN

## 2015-02-08 NOTE — Progress Notes (Addendum)
[facility] MD Progress Note  02/08/2015 4:54 PM April [name]  MRN:  [id number] Subjective:  Better Apparently the patient took Brintellix and we increase the dose up to 20. A week or 2 later she felt much worse. She felt anxious cried a great deal felt very emotional. Over the phone we took her off Brintellix improve her back on Lexapro. Lexapro 20 mg was very helpful but it affected her sex drive. At this time the patient's mood is just about back to normal. She is sleeping poorly which is her only issue. She has difficulty falling asleep. The patient also describes mild low level anxiety that occurs mainly in the beginning of the day. The patient is eating well and has good energy. 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 She can think and concentrate well. She is a good sense of worth. Over the last few months for husband gotten quite ill and it made her very anxious and she believes that since then she seems to have persistent anxiety. At no point was the patient never suicidal. Her psychomotor functioning is been normal. The patient is never been psychotic. Therefore the additional problems the patient's having is getting a good night sleep and some low-level anxiety. The consideration of adding Wellbutrin to try to reverse her sexual issues should be considered probably at her next visit in 7 weeks. Overall though the patient is doing much better now since she's back on Lexapro. Principal Problem:Major Depression Diagnosis:   Patient Active Problem List   Diagnosis Date Noted  . Left hamstring injury [S76.302A] 01/24/2015  . Major depressive disorder, recurrent episode, moderate [F33.1] 12/02/2014  . Acute low back pain [M54.5] 11/23/2014  . Occipital neuralgia [M54.81]   . ANEMIA-NOS [D64.9] 12/11/2009  . DEPRESSION [F32.9] 12/11/2009  . HYPERCHOLESTEROLEMIA [E78.0] 11/16/2008  . OBESITY [E66.9] 11/16/2008  . ANXIETY DEPRESSION [F34.1] 11/16/2008  . ALLERGIC RHINITIS  [J30.9] 11/16/2008  . Esophageal reflux [K21.9] 11/16/2008  . CONSTIPATION [K59.00] 11/16/2008  . Palpitations [R00.2] 11/16/2008   Total Time spent with patient: 30 minutes   Past Medical History:  Past Medical History  Diagnosis Date  . ALLERGIC RHINITIS   . ANXIETY DEPRESSION   . CONSTIPATION   . DEPRESSION   . Esophageal reflux   . HYPERCHOLESTEROLEMIA   . OBESITY   . Palpitations   . Occipital neuralgia     Past Surgical History  Procedure Laterality Date  . No past surgeries     Family History:  Family History  Problem Relation Age of Onset  . Hypertension Mother   . Diabetes Mother   . Hyperlipidemia Mother   . Breast cancer Mother 17  . Osteoarthritis Mother   . Hyperlipidemia Father   . Hyperlipidemia Brother   . Coronary artery disease Father     MI age 10   Social History:  History  Alcohol Use No     History  Drug Use No    History   Social History  . Marital Status: Married    Spouse Name: N/A  . Number of Children: N/A  . Years of Education: N/A   Social History Main Topics  . Smoking status: Never Smoker   . Smokeless tobacco: Not on file     Comment: Married, lives with spouse  . Alcohol Use: No  . Drug Use: No  . Sexual Activity: Not on file   Other Topics Concern  . Not on file   Social History Narrative  Additional History:    Sleep: Poor  Appetite:  Good   Assessment:   Musculoskeletal: Strength & Muscle Tone:  Gait & Station:  Patient leans:    Psychiatric Specialty Exam: Physical Exam  ROS  Blood pressure 128/79, pulse 80, height 5\' 10"  (1.778 m), weight 236 lb 9.6 oz (107.321 kg).Body mass index is 33.95 kg/(m^2).  General Appearance: Casual  Eye Contact::  Good  Speech:  Normal Rate  Volume:  Normal  Mood:  Euthymic  Affect:  Appropriate  Thought Process:  Coherent  Orientation:  Full (Time, Place, and Person)  Thought Connl  Suicidal Thoughts:  No  Homicidal Thoughts:  No  Memory:  Negative   Judgement:  Good  Insight:  Good  Psychomotor Activity:  Normal  Concentration:  Good  Recall:  Good  Fund of Knowledge:Good  Language: Good  Akathisia:  No  Handed:  Right  AIMS (if indicated):     Assets:  Communication Skills  ADL's:  Intact  Cognition: WNL  Sleep:        Current Medications: Current Outpatient Prescriptions  Medication Sig Dispense Refill  . albuterol (PROAIR HFA) 108 (90 BASE) MCG/ACT inhaler Inhale 2 puffs into the lungs every 6 (six) hours as needed for wheezing or shortness of breath. 1 Inhaler 3  . cetirizine (ZYRTEC) 10 MG tablet Take 10 mg by mouth daily.      . clonazePAM (KLONOPIN) 0.5 MG tablet Take 1 tablet (0.5 mg total) by mouth 2 (two) times daily as needed for anxiety. 60 tablet 3  . Diclofenac Sodium 2 % SOLN Apply 1 pump twice daily. 112 g 3  . docusate sodium (COLACE) 100 MG capsule Take 100 mg by mouth 2 (two) times daily.    Marland Kitchen escitalopram (LEXAPRO) 20 MG tablet Take 1 tablet (20 mg total) by mouth daily. 30 tablet 3  . esomeprazole (NEXIUM) 40 MG capsule TAKE 1 CAPSULE BY MOUTH DAILY. 90 capsule 3  . etonogestrel-ethinyl estradiol (NUVARING) 0.12-0.015 MG/24HR vaginal ring Place 1 each vaginally every 28 (twenty-eight) days. Insert vaginally and leave in place for 3 consecutive weeks, then remove for 1 week.     . fenofibrate micronized (LOFIBRA) 67 MG capsule TAKE 1 CAPSULE BY MOUTH DAILY BEFORE BREAKFAST. 90 capsule 3  . fluticasone (FLONASE) 50 MCG/ACT nasal spray Place 2 sprays into both nostrils 2 (two) times daily as needed. 16 g 5  . gabapentin (NEURONTIN) 600 MG tablet Take 900 mg by mouth  BID    . ibuprofen (ADVIL,MOTRIN) 600 MG tablet Take 1 tablet (600 mg total) by mouth every 6 (six) hours as needed. 60 tablet 0  . montelukast (SINGULAIR) 10 MG tablet TAKE 1 TABLET BY MOUTH AT BEDTIME. 90 tablet 3  . Multiple Vitamin (MULTIVITAMIN) tablet Take 1 tablet by mouth daily.      Marland Kitchen olopatadine (PATANOL) 0.1 % ophthalmic solution  Place 1 drop into both eyes 2 (two) times daily. 5 mL 3  . Omega-3 Fatty Acids (FISH OIL TRIPLE STRENGTH) 1400 MG CAPS Take 1 capsule by mouth 2 (two) times daily.    . simvastatin (ZOCOR) 40 MG tablet TAKE 1 TABLET BY MOUTH AT BEDTIME. 90 tablet 3  . vitamin C (ASCORBIC ACID) 500 MG tablet Take 500 mg by mouth daily.      Marland Kitchen zolpidem (AMBIEN) 10 MG tablet Take 1 tablet (10 mg total) by mouth at bedtime as needed for sleep. 30 tablet 3   No current facility-administered medications for this visit.  Lab Results: No results found for this or any previous visit (from the past 48 hour(s)).  Physical Findings: AIMS:  , ,  ,  ,    CIWA:    COWS:     Treatment Plan Summary: At this time we will continue Lexapro 20 mg. Today we'll go ahead and add Ambien 10 mg at night and Klonopin 0.5 mg every morning and 0.5 mg when necessary as needed. This patient to return to see me in 7 weeks. At that time we will consider adding Wellbutrin to her Lexapro. At this time the patient is very stable.   Medical Decision Making:  Review of New Medication or Change in Dosage (2)     April [name] 02/08/2015, 4:54 PM

## 2015-02-13 ENCOUNTER — Ambulatory Visit (INDEPENDENT_AMBULATORY_CARE_PROVIDER_SITE_OTHER): Payer: 59 | Admitting: Psychology

## 2015-02-13 DIAGNOSIS — F411 Generalized anxiety disorder: Secondary | ICD-10-CM

## 2015-02-15 ENCOUNTER — Ambulatory Visit: Payer: Self-pay | Admitting: Family Medicine

## 2015-03-31 ENCOUNTER — Encounter (HOSPITAL_COMMUNITY): Payer: Self-pay | Admitting: Psychiatry

## 2015-03-31 ENCOUNTER — Ambulatory Visit (INDEPENDENT_AMBULATORY_CARE_PROVIDER_SITE_OTHER): Payer: 59 | Admitting: Psychiatry

## 2015-03-31 VITALS — BP 133/79 | HR 76 | Ht 70.0 in | Wt 237.8 lb

## 2015-03-31 DIAGNOSIS — F331 Major depressive disorder, recurrent, moderate: Secondary | ICD-10-CM | POA: Diagnosis not present

## 2015-03-31 MED ORDER — BUPROPION HCL ER (XL) 150 MG PO TB24: ORAL_TABLET | ORAL | Status: DC

## 2015-03-31 NOTE — Progress Notes (Signed)
[facility] MD Progress Note  03/31/2015 10:35 AM April [name]  MRN:  [id number] Subjective: Much better At this time the patient feels great. She denies any depression. She's back on 20 mg of Lexapro and it's very helpful. She never filled the Klonopin for anxiety and rarely takes her Ambien. She is doing well just on Lexapro. Her husband is well as he had pneumonia this year and was ill. The patient says that generally her sleep is good her appetite is great she is lots of energy and is doing great at work. The patient is functioning extremely well. She certainly is not suicidal and never has been psychotic. The only fair issue is sexual dysfunction. She also says that her energy level is not as good as she liked it to be. Today we discussed adding Wellbutrin to reverse some of her sexual problems. She is very interested in this. Principal Problem: Major depression recurrent episode moderate Diagnosis:   Patient Active Problem List   Diagnosis Date Noted  . Left hamstring injury [S76.302A] 01/24/2015  . Major depressive disorder, recurrent episode, moderate [F33.1] 12/02/2014  . Acute low back pain [M54.5] 11/23/2014  . Occipital neuralgia [M54.81]   . ANEMIA-NOS [D64.9] 12/11/2009  . DEPRESSION [F32.9] 12/11/2009  . HYPERCHOLESTEROLEMIA [E78.0] 11/16/2008  . OBESITY [E66.9] 11/16/2008  . ANXIETY DEPRESSION [F34.1] 11/16/2008  . ALLERGIC RHINITIS [J30.9] 11/16/2008  . Esophageal reflux [K21.9] 11/16/2008  . CONSTIPATION [K59.00] 11/16/2008  . Palpitations [R00.2] 11/16/2008   Total Time spent with patient: 30 minutes   Past Medical History:  Past Medical History  Diagnosis Date  . ALLERGIC RHINITIS   . ANXIETY DEPRESSION   . CONSTIPATION   . DEPRESSION   . Esophageal reflux   . HYPERCHOLESTEROLEMIA   . OBESITY   . Palpitations   . Occipital neuralgia     Past Surgical History  Procedure Laterality Date  . No past surgeries     Family History:  Family History  Problem  Relation Age of Onset  . Hypertension Mother   . Diabetes Mother   . Hyperlipidemia Mother   . Breast cancer Mother 68  . Osteoarthritis Mother   . Hyperlipidemia Father   . Hyperlipidemia Brother   . Coronary artery disease Father     MI age 41   Social History:  History  Alcohol Use No     History  Drug Use No    History   Social History  . Marital Status: Married    Spouse Name: N/A  . Number of Children: N/A  . Years of Education: N/A   Social History Main Topics  . Smoking status: Never Smoker   . Smokeless tobacco: Not on file     Comment: Married, lives with spouse  . Alcohol Use: No  . Drug Use: No  . Sexual Activity: Yes    Birth Control/ Protection: Pill   Other Topics Concern  . None   Social History Narrative  2 Additional History:    Sleep: Fair  Appetite:  Good   Assessment:   Musculoskeletal: Strength & Muscle Tone:  Gait & Station:  Patient leans:    Psychiatric Specialty Exam: Physical Exam  ROS  Blood pressure 133/79, pulse 76, height 5\' 10"  (1.778 m), weight 237 lb 12.8 oz (107.865 kg).Body mass index is 34.12 kg/(m^2).  General Appearance: Casual  Eye Contact::  Good  Speech:  Clear and Coherent  Volume:  Normal  Mood:  NA  Affect:  Congruent  Thought Process:  Coherent  Orientation:  Full (Time, Place, and Person)  Thought Content:  WDL  Suicidal Thoughts:  No  Homicidal Thoughts:  No  Memory:  NA  Judgement:  Good  Insight:  Good    Concentration:  Good  Recall:  Good  Fund of Knowledge:Good  Language: Good  Akathisia:  No  Handed:  Right  AIMS (if indicated):     Assets:  Desire for Improvement  ADL's:  Intact  Cognition: WNL  Sleep:        Current Medications: Current Outpatient Prescriptions  Medication Sig Dispense Refill  . albuterol (PROAIR HFA) 108 (90 BASE) MCG/ACT inhaler Inhale 2 puffs into the lungs every 6 (six) hours as needed for wheezing or shortness of breath. 1 Inhaler 3  . cetirizine  (ZYRTEC) 10 MG tablet Take 10 mg by mouth daily.      . clonazePAM (KLONOPIN) 0.5 MG tablet Take 1 tablet (0.5 mg total) by mouth 2 (two) times daily as needed for anxiety. 60 tablet 3  . Diclofenac Sodium 2 % SOLN Apply 1 pump twice daily. 112 g 3  . docusate sodium (COLACE) 100 MG capsule Take 100 mg by mouth 2 (two) times daily.    Marland Kitchen escitalopram (LEXAPRO) 20 MG tablet Take 1 tablet (20 mg total) by mouth daily. 30 tablet 3  . esomeprazole (NEXIUM) 40 MG capsule TAKE 1 CAPSULE BY MOUTH DAILY. 90 capsule 3  . etonogestrel-ethinyl estradiol (NUVARING) 0.12-0.015 MG/24HR vaginal ring Place 1 each vaginally every 28 (twenty-eight) days. Insert vaginally and leave in place for 3 consecutive weeks, then remove for 1 week.     . fenofibrate micronized (LOFIBRA) 67 MG capsule TAKE 1 CAPSULE BY MOUTH DAILY BEFORE BREAKFAST. 90 capsule 3  . fluticasone (FLONASE) 50 MCG/ACT nasal spray Place 2 sprays into both nostrils 2 (two) times daily as needed. 16 g 5  . gabapentin (NEURONTIN) 600 MG tablet Take 900 mg by mouth  BID    . ibuprofen (ADVIL,MOTRIN) 600 MG tablet Take 1 tablet (600 mg total) by mouth every 6 (six) hours as needed. 60 tablet 0  . montelukast (SINGULAIR) 10 MG tablet TAKE 1 TABLET BY MOUTH AT BEDTIME. 90 tablet 3  . Multiple Vitamin (MULTIVITAMIN) tablet Take 1 tablet by mouth daily.      Marland Kitchen olopatadine (PATANOL) 0.1 % ophthalmic solution Place 1 drop into both eyes 2 (two) times daily. 5 mL 3  . simvastatin (ZOCOR) 40 MG tablet TAKE 1 TABLET BY MOUTH AT BEDTIME. 90 tablet 3  . vitamin C (ASCORBIC ACID) 500 MG tablet Take 500 mg by mouth daily.      Marland Kitchen buPROPion (WELLBUTRIN XL) 150 MG 24 hr tablet 2 q am 60 tablet 5  . Omega-3 Fatty Acids (FISH OIL TRIPLE STRENGTH) 1400 MG CAPS Take 1 capsule by mouth 2 (two) times daily.    Marland Kitchen zolpidem (AMBIEN) 10 MG tablet Take 1 tablet (10 mg total) by mouth at bedtime as needed for sleep. 30 tablet 3   No current facility-administered medications for  this visit.    Lab Results: No results found for this or any previous visit (from the past 48 hour(s)).  Physical Findings: AIMS:  , ,  ,  ,    CIWA:    COWS:     Treatment Plan Summary: At this time the patient will continue taking Lexapro 20 mg. She gets is actually from her primary care doctor. Today her intervention will be to at Wellbutrin 150 XL one  in the morning for a week and then increase it to 2. She'll be seen back in 3 months and she'll share with me if she has any differences in her sexual functioning. At this time the patient will no longer take any Klonopin and actually she never really started and will continue taking rare Ambien 5 mg. Importantly the patient will continue in her therapy with Dr. Apolonio Schneiders. This patient she'll be seen again in 3 months. The patient describes no physical complaints at all at this time.   Medical Decision Making:  Established Problem, Stable/Improving (1)     April [name] IRVING 03/31/2015, 10:35 AM

## 2015-04-01 ENCOUNTER — Encounter: Payer: Self-pay | Admitting: Internal Medicine

## 2015-04-03 ENCOUNTER — Telehealth (HOSPITAL_COMMUNITY): Payer: Self-pay

## 2015-04-03 NOTE — Telephone Encounter (Signed)
Telephone call with patient after she left a message Dr. Casimiro Needle had not sent in her Wellbutrin refill after she saw him on 03/31/15.  Reviewed record and informed patient her orders were e-scribed to [facility] in [place], Wellbutrin XL 150mg , 2 each morning, #60 plus 5 refills.  Requested patient call [facility] where she wanted to fill the prescriptions and have them call [facility] to transfer the orders.  Patient agreed to do this and will call back if any problems getting orders transferred to her outpatient pharmacy.

## 2015-05-01 ENCOUNTER — Encounter: Payer: 59 | Admitting: Internal Medicine

## 2015-05-04 ENCOUNTER — Other Ambulatory Visit (INDEPENDENT_AMBULATORY_CARE_PROVIDER_SITE_OTHER): Payer: 59

## 2015-05-04 ENCOUNTER — Ambulatory Visit (INDEPENDENT_AMBULATORY_CARE_PROVIDER_SITE_OTHER): Payer: 59 | Admitting: Internal Medicine

## 2015-05-04 ENCOUNTER — Encounter: Payer: Self-pay | Admitting: Internal Medicine

## 2015-05-04 VITALS — BP 118/76 | HR 84 | Temp 98.0°F | Ht 70.0 in | Wt 238.2 lb

## 2015-05-04 DIAGNOSIS — K5900 Constipation, unspecified: Secondary | ICD-10-CM

## 2015-05-04 DIAGNOSIS — E669 Obesity, unspecified: Secondary | ICD-10-CM

## 2015-05-04 DIAGNOSIS — Z0000 Encounter for general adult medical examination without abnormal findings: Secondary | ICD-10-CM

## 2015-05-04 DIAGNOSIS — E785 Hyperlipidemia, unspecified: Secondary | ICD-10-CM

## 2015-05-04 DIAGNOSIS — K219 Gastro-esophageal reflux disease without esophagitis: Secondary | ICD-10-CM

## 2015-05-04 LAB — COMPREHENSIVE METABOLIC PANEL
ALBUMIN: 4 g/dL (ref 3.5–5.2)
ALT: 15 U/L (ref 0–35)
AST: 14 U/L (ref 0–37)
Alkaline Phosphatase: 75 U/L (ref 39–117)
BUN: 12 mg/dL (ref 6–23)
CO2: 27 mEq/L (ref 19–32)
Calcium: 9.8 mg/dL (ref 8.4–10.5)
Chloride: 104 mEq/L (ref 96–112)
Creatinine, Ser: 0.85 mg/dL (ref 0.40–1.20)
GFR: 79.39 mL/min (ref >60.00)
GLUCOSE: 100 mg/dL — AB (ref 70–99)
Potassium: 4.2 mEq/L (ref 3.5–5.1)
SODIUM: 138 meq/L (ref 135–145)
Total Bilirubin: 0.4 mg/dL (ref 0.2–1.2)
Total Protein: 7.6 g/dL (ref 6.0–8.3)

## 2015-05-04 LAB — CBC
HCT: 39.5 % (ref 36.0–46.0)
Hemoglobin: 13.3 g/dL (ref 12.0–15.0)
MCHC: 33.7 g/dL (ref 30.0–36.0)
MCV: 84.4 fl (ref 78.0–100.0)
PLATELETS: 341 10*3/uL (ref 150.0–400.0)
RBC: 4.68 Mil/uL (ref 3.87–5.11)
RDW: 13.6 % (ref 11.5–15.5)
WBC: 7.4 10*3/uL (ref 4.0–10.5)

## 2015-05-04 LAB — HEMOGLOBIN A1C: Hgb A1c MFr Bld: 5.4 % (ref 4.6–6.5)

## 2015-05-04 LAB — LIPID PANEL
CHOLESTEROL: 158 mg/dL (ref 0–200)
HDL: 59.9 mg/dL (ref >39.00)
LDL CALC: 74 mg/dL (ref 0–99)
NONHDL: 98.1
TRIGLYCERIDES: 122 mg/dL (ref 0.0–149.0)
Total CHOL/HDL Ratio: 3
VLDL: 24.4 mg/dL (ref 0.0–40.0)

## 2015-05-04 LAB — TSH: TSH: 1.17 u[IU]/mL (ref 0.35–4.50)

## 2015-05-04 MED ORDER — MONTELUKAST SODIUM 10 MG PO TABS: 10.0000 mg | ORAL_TABLET | Freq: Every day | ORAL | Status: DC

## 2015-05-04 MED ORDER — ALBUTEROL SULFATE HFA 108 (90 BASE) MCG/ACT IN AERS: 2.0000 | INHALATION_SPRAY | Freq: Four times a day (QID) | RESPIRATORY_TRACT | Status: DC | PRN

## 2015-05-04 MED ORDER — ESOMEPRAZOLE MAGNESIUM 40 MG PO CPDR: 40.0000 mg | DELAYED_RELEASE_CAPSULE | Freq: Every day | ORAL | Status: DC

## 2015-05-04 MED ORDER — SIMVASTATIN 40 MG PO TABS: 40.0000 mg | ORAL_TABLET | Freq: Every day | ORAL | Status: DC

## 2015-05-04 MED ORDER — FLUTICASONE PROPIONATE 50 MCG/ACT NA SUSP: 2.0000 | Freq: Two times a day (BID) | NASAL | Status: DC | PRN

## 2015-05-04 NOTE — Assessment & Plan Note (Signed)
Gets flu shot yearly. Up to date on tetanus. No indication for early screening. See Ob/Gyn for her pap and breast exam. Non-smoker. She does need to work on regular exercise and talked to her about that today. Also talked to her about the need for sun protection to decrease future risk of skin cancer. Checking labs today.

## 2015-05-04 NOTE — Assessment & Plan Note (Signed)
Checking labs today, on fenofibrate and simvastatin. Checking lipid panel and LFTs.

## 2015-05-04 NOTE — Progress Notes (Signed)
Pre visit review using our clinic review tool, if applicable. No additional management support is needed unless otherwise documented below in the visit note. 

## 2015-05-04 NOTE — Assessment & Plan Note (Signed)
Fiber intake okay, water intake good. Uses colace daily with good results. Gets bloating and gas from metamucil or benefiber.

## 2015-05-04 NOTE — Assessment & Plan Note (Signed)
Stable on nexium. Needs daily and recurrent symptoms when she misses doses or is late with doses.

## 2015-05-04 NOTE — Assessment & Plan Note (Signed)
Has hyperlipidemia and joint pains from her weight. Talked to her about weight reduction for pain management of her joints. Checking HgA1c today as not checking in the past and has had elevated glucose levels in the past.

## 2015-05-04 NOTE — Patient Instructions (Addendum)
We are going to check your labs today and call you back with the results.   Keep working on regular exercise to keep you health good.   The best way to keep healthy joints is to keep pressure off them. Losing 1 pound of weight takes 4 pounds of pressure off your knees.   Health Maintenance Adopting a healthy lifestyle and getting preventive care can go a long way to promote health and wellness. Talk with your health care provider about what schedule of regular examinations is right for you. This is a good chance for you to check in with your provider about disease prevention and staying healthy. In between checkups, there are plenty of things you can do on your own. Experts have done a lot of research about which lifestyle changes and preventive measures are most likely to keep you healthy. Ask your health care provider for more information. WEIGHT AND DIET  Eat a healthy diet  Be sure to include plenty of vegetables, fruits, low-fat dairy products, and lean protein.  Do not eat a lot of foods high in solid fats, added sugars, or salt.  Get regular exercise. This is one of the most important things you can do for your health.  Most adults should exercise for at least 150 minutes each week. The exercise should increase your heart rate and make you sweat (moderate-intensity exercise).  Most adults should also do strengthening exercises at least twice a week. This is in addition to the moderate-intensity exercise.  Maintain a healthy weight  Body mass index (BMI) is a measurement that can be used to identify possible weight problems. It estimates body fat based on height and weight. Your health care provider can help determine your BMI and help you achieve or maintain a healthy weight.  For females 28 years of age and older:   A BMI below 18.5 is considered underweight.  A BMI of 18.5 to 24.9 is normal.  A BMI of 25 to 29.9 is considered overweight.  A BMI of 30 and above is  considered obese.  Watch levels of cholesterol and blood lipids  You should start having your blood tested for lipids and cholesterol at 38 years of age, then have this test every 5 years.  You may need to have your cholesterol levels checked more often if:  Your lipid or cholesterol levels are high.  You are older than 38 years of age.  You are at high risk for heart disease.  CANCER SCREENING   Lung Cancer  Lung cancer screening is recommended for adults 69-70 years old who are at high risk for lung cancer because of a history of smoking.  A yearly low-dose CT scan of the lungs is recommended for people who:  Currently smoke.  Have quit within the past 15 years.  Have at least a 30-pack-year history of smoking. A pack year is smoking an average of one pack of cigarettes a day for 1 year.  Yearly screening should continue until it has been 15 years since you quit.  Yearly screening should stop if you develop a health problem that would prevent you from having lung cancer treatment.  Breast Cancer  Practice breast self-awareness. This means understanding how your breasts normally appear and feel.  It also means doing regular breast self-exams. Let your health care provider know about any changes, no matter how small.  If you are in your 20s or 30s, you should have a clinical breast exam (CBE) by a  a health care provider every 1-3 years as part of a regular health exam.  If you are 40 or older, have a CBE every year. Also consider having a breast X-ray (mammogram) every year.  If you have a family history of breast cancer, talk to your health care provider about genetic screening.  If you are at high risk for breast cancer, talk to your health care provider about having an MRI and a mammogram every year.  Breast cancer gene (BRCA) assessment is recommended for women who have family members with BRCA-related cancers. BRCA-related cancers  include:  Breast.  Ovarian.  Tubal.  Peritoneal cancers.  Results of the assessment will determine the need for genetic counseling and BRCA1 and BRCA2 testing. Cervical Cancer Routine pelvic examinations to screen for cervical cancer are no longer recommended for nonpregnant women who are considered low risk for cancer of the pelvic organs (ovaries, uterus, and vagina) and who do not have symptoms. A pelvic examination may be necessary if you have symptoms including those associated with pelvic infections. Ask your health care provider if a screening pelvic exam is right for you.   The Pap test is the screening test for cervical cancer for women who are considered at risk.  If you had a hysterectomy for a problem that was not cancer or a condition that could lead to cancer, then you no longer need Pap tests.  If you are older than 65 years, and you have had normal Pap tests for the past 10 years, you no longer need to have Pap tests.  If you have had past treatment for cervical cancer or a condition that could lead to cancer, you need Pap tests and screening for cancer for at least 20 years after your treatment.  If you no longer get a Pap test, assess your risk factors if they change (such as having a new sexual partner). This can affect whether you should start being screened again.  Some women have medical problems that increase their chance of getting cervical cancer. If this is the case for you, your health care provider may recommend more frequent screening and Pap tests.  The human papillomavirus (HPV) test is another test that may be used for cervical cancer screening. The HPV test looks for the virus that can cause cell changes in the cervix. The cells collected during the Pap test can be tested for HPV.  The HPV test can be used to screen women 30 years of age and older. Getting tested for HPV can extend the interval between normal Pap tests from three to five years.  An HPV  test also should be used to screen women of any age who have unclear Pap test results.  After 38 years of age, women should have HPV testing as often as Pap tests.  Colorectal Cancer  This type of cancer can be detected and often prevented.  Routine colorectal cancer screening usually begins at 38 years of age and continues through 38 years of age.  Your health care provider may recommend screening at an earlier age if you have risk factors for colon cancer.  Your health care provider may also recommend using home test kits to check for hidden blood in the stool.  A small camera at the end of a tube can be used to examine your colon directly (sigmoidoscopy or colonoscopy). This is done to check for the earliest forms of colorectal cancer.  Routine screening usually begins at age 50.  Direct examination   of the colon should be repeated every 5-10 years through 38 years of age. However, you may need to be screened more often if early forms of precancerous polyps or small growths are found. Skin Cancer  Check your skin from head to toe regularly.  Tell your health care provider about any new moles or changes in moles, especially if there is a change in a mole's shape or color.  Also tell your health care provider if you have a mole that is larger than the size of a pencil eraser.  Always use sunscreen. Apply sunscreen liberally and repeatedly throughout the day.  Protect yourself by wearing long sleeves, pants, a wide-brimmed hat, and sunglasses whenever you are outside. HEART DISEASE, DIABETES, AND HIGH BLOOD PRESSURE   Have your blood pressure checked at least every 1-2 years. High blood pressure causes heart disease and increases the risk of stroke.  If you are between 55 years and 79 years old, ask your health care provider if you should take aspirin to prevent strokes.  Have regular diabetes screenings. This involves taking a blood sample to check your fasting blood sugar  level.  If you are at a normal weight and have a low risk for diabetes, have this test once every three years after 38 years of age.  If you are overweight and have a high risk for diabetes, consider being tested at a younger age or more often. PREVENTING INFECTION  Hepatitis B  If you have a higher risk for hepatitis B, you should be screened for this virus. You are considered at high risk for hepatitis B if:  You were born in a country where hepatitis B is common. Ask your health care provider which countries are considered high risk.  Your parents were born in a high-risk country, and you have not been immunized against hepatitis B (hepatitis B vaccine).  You have HIV or AIDS.  You use needles to inject street drugs.  You live with someone who has hepatitis B.  You have had sex with someone who has hepatitis B.  You get hemodialysis treatment.  You take certain medicines for conditions, including cancer, organ transplantation, and autoimmune conditions. Hepatitis C  Blood testing is recommended for:  Everyone born from 1945 through 1965.  Anyone with known risk factors for hepatitis C. Sexually transmitted infections (STIs)  You should be screened for sexually transmitted infections (STIs) including gonorrhea and chlamydia if:  You are sexually active and are younger than 38 years of age.  You are older than 38 years of age and your health care provider tells you that you are at risk for this type of infection.  Your sexual activity has changed since you were last screened and you are at an increased risk for chlamydia or gonorrhea. Ask your health care provider if you are at risk.  If you do not have HIV, but are at risk, it may be recommended that you take a prescription medicine daily to prevent HIV infection. This is called pre-exposure prophylaxis (PrEP). You are considered at risk if:  You are sexually active and do not regularly use condoms or know the HIV status  of your partner(s).  You take drugs by injection.  You are sexually active with a partner who has HIV. Talk with your health care provider about whether you are at high risk of being infected with HIV. If you choose to begin PrEP, you should first be tested for HIV. You should then be tested every   3 months for as long as you are taking PrEP.  PREGNANCY   If you are premenopausal and you may become pregnant, ask your health care provider about preconception counseling.  If you may become pregnant, take 400 to 800 micrograms (mcg) of folic acid every day.  If you want to prevent pregnancy, talk to your health care provider about birth control (contraception). OSTEOPOROSIS AND MENOPAUSE   Osteoporosis is a disease in which the bones lose minerals and strength with aging. This can result in serious bone fractures. Your risk for osteoporosis can be identified using a bone density scan.  If you are 65 years of age or older, or if you are at risk for osteoporosis and fractures, ask your health care provider if you should be screened.  Ask your health care provider whether you should take a calcium or vitamin D supplement to lower your risk for osteoporosis.  Menopause may have certain physical symptoms and risks.  Hormone replacement therapy may reduce some of these symptoms and risks. Talk to your health care provider about whether hormone replacement therapy is right for you.  HOME CARE INSTRUCTIONS   Schedule regular health, dental, and eye exams.  Stay current with your immunizations.   Do not use any tobacco products including cigarettes, chewing tobacco, or electronic cigarettes.  If you are pregnant, do not drink alcohol.  If you are breastfeeding, limit how much and how often you drink alcohol.  Limit alcohol intake to no more than 1 drink per day for nonpregnant women. One drink equals 12 ounces of beer, 5 ounces of wine, or 1 ounces of hard liquor.  Do not use street  drugs.  Do not share needles.  Ask your health care provider for help if you need support or information about quitting drugs.  Tell your health care provider if you often feel depressed.  Tell your health care provider if you have ever been abused or do not feel safe at home. Document Released: 05/20/2011 Document Revised: 03/21/2014 Document Reviewed: 10/06/2013 Exit[employer] Patient Information 2015 [employer]. This information is not intended to replace advice given to you by your health care provider. Make sure you discuss any questions you have with your health care provider.  

## 2015-05-04 NOTE — Progress Notes (Signed)
   Subjective:    [name], female    DOB: [birth year], 39 y.o.   MRN: [id number]  HPI The patient is a 39 YO female coming in for wellness. Please see A/P for status and treatment of chronic medical problems.   PMH, [facility], social history reviewed and updated.   Review of Systems  Constitutional: Positive for unexpected weight change. Negative for fever, activity change, appetite change and fatigue.  HENT: Negative.   Eyes: Negative.   Respiratory: Negative for cough, chest tightness, shortness of breath and wheezing.   Cardiovascular: Negative for chest pain, palpitations and leg swelling.  Gastrointestinal: Positive for constipation. Negative for nausea, abdominal pain, diarrhea and abdominal distention.       Chronic, stable  Musculoskeletal: Positive for arthralgias. Negative for myalgias, back pain and gait problem.  Skin: Negative.   Neurological: Negative.   Psychiatric/Behavioral: Negative for behavioral problems, confusion, decreased concentration and agitation. The patient is nervous/anxious.       Objective:   Physical Exam  Constitutional: She is oriented to person, place, and time. She appears well-developed and well-nourished.  HENT:  Head: Normocephalic and atraumatic.  Eyes: EOM are normal.  Neck: Normal range of motion. No thyromegaly present.  Cardiovascular: Normal rate and regular rhythm.   Pulmonary/Chest: Effort normal and breath sounds normal. No respiratory distress. She has no wheezes.  Abdominal: Soft. Bowel sounds are normal. She exhibits no distension. There is no tenderness. There is no rebound.  Musculoskeletal: She exhibits no edema.  Lymphadenopathy:    She has no cervical adenopathy.  Neurological: She is alert and oriented to person, place, and time. Coordination normal.  Skin: Skin is warm and dry.  Psychiatric: She has a normal mood and affect.   Filed Vitals:   05/04/15 0800  BP: 118/76  Pulse: 84  Temp: 98 F (36.7  C)  Height: 5\' 10"  (1.778 m)  Weight: 238 lb 4 oz (108.069 kg)  SpO2: 98%      Assessment & Plan:

## 2015-05-12 ENCOUNTER — Other Ambulatory Visit: Payer: Self-pay | Admitting: Internal Medicine

## 2015-06-15 ENCOUNTER — Other Ambulatory Visit: Payer: Self-pay | Admitting: Internal Medicine

## 2015-07-11 ENCOUNTER — Other Ambulatory Visit: Payer: Self-pay | Admitting: Internal Medicine

## 2015-07-11 ENCOUNTER — Ambulatory Visit (INDEPENDENT_AMBULATORY_CARE_PROVIDER_SITE_OTHER): Payer: 59 | Admitting: Family

## 2015-07-11 ENCOUNTER — Encounter: Payer: Self-pay | Admitting: Family

## 2015-07-11 VITALS — BP 136/74 | HR 100 | Temp 98.2°F | Resp 18 | Ht 70.0 in | Wt 245.0 lb

## 2015-07-11 DIAGNOSIS — R21 Rash and other nonspecific skin eruption: Secondary | ICD-10-CM | POA: Diagnosis not present

## 2015-07-11 MED ORDER — CEPHALEXIN 500 MG PO CAPS: 500.0000 mg | ORAL_CAPSULE | Freq: Two times a day (BID) | ORAL | Status: DC

## 2015-07-11 MED ORDER — CEPHALEXIN 500 MG PO CAPS: 500.0000 mg | ORAL_CAPSULE | Freq: Four times a day (QID) | ORAL | Status: DC

## 2015-07-11 NOTE — Patient Instructions (Signed)
Thank you for choosing [facility].  Summary/Instructions:  Please keep the wounds clean and dry; wash with soap and water. Use the bactroban as needed.  Your prescription(s) have been submitted to your pharmacy or been printed and provided for you. Please take as directed and contact our office if you believe you are having problem(s) with the medication(s) or have any questions.  If your symptoms worsen or fail to improve, please contact our office for further instruction, or in case of emergency go directly to the emergency room at the closest medical facility.

## 2015-07-11 NOTE — Assessment & Plan Note (Signed)
Rash of questionable origin with home incision and drainage. Start keflex to cover for infection. Continue over the counter antibiotic cream as needed and keep clean with soap and water. Follow up if symptoms worsen or fail to improve.

## 2015-07-11 NOTE — Progress Notes (Signed)
Pre visit review using our clinic review tool, if applicable. No additional management support is needed unless otherwise documented below in the visit note. 

## 2015-07-11 NOTE — Progress Notes (Signed)
Subjective:    [name], female    DOB: [birth year], 39 y.o.   MRN: [id number]  Chief Complaint  Patient presents with  . insect bites    thinks that she has gotten flea bites from playing with a kitten last week, thinks one of the bites on her right arm has gotten infected from scratching it so much    HPI:  April [name] is a 39 y.o. female with a PMH of esophageal reflux, constipation, hyperlipidemia, obesity, anxiety and depression who presents today for an acute office visit.  This is a new problem. Associated symptoms of a rash located on her bilateral arms that has been going on for approximately one week as a result of playing with a kitten. Described as red and itchy with potential blistering.  Modifying factors include lancing one of the wounds and treating with antibiotic cream. Notes continued widening of the wound since yesterday. Denies fevers or chills.   No Known Allergies  Current Outpatient Prescriptions on File Prior to Visit  Medication Sig Dispense Refill  . albuterol (PROAIR HFA) 108 (90 BASE) MCG/ACT inhaler Inhale 2 puffs into the lungs every 6 (six) hours as needed for wheezing or shortness of breath. 1 Inhaler 0  . buPROPion (WELLBUTRIN XL) 150 MG 24 hr tablet 2 q am 60 tablet 5  . cetirizine (ZYRTEC) 10 MG tablet Take 10 mg by mouth daily.      . clonazePAM (KLONOPIN) 0.5 MG tablet Take 1 tablet (0.5 mg total) by mouth 2 (two) times daily as needed for anxiety. 60 tablet 3  . Diclofenac Sodium 2 % SOLN Apply 1 pump twice daily. 112 g 3  . docusate sodium (COLACE) 100 MG capsule Take 100 mg by mouth 2 (two) times daily.    Marland Kitchen escitalopram (LEXAPRO) 20 MG tablet Take 1 tablet (20 mg total) by mouth daily. 30 tablet 3  . esomeprazole (NEXIUM) 40 MG capsule Take 1 capsule (40 mg total) by mouth daily. 90 capsule 3  . etonogestrel-ethinyl estradiol (NUVARING) 0.12-0.015 MG/24HR vaginal ring Place 1 each vaginally every 28 (twenty-eight)  days. Insert vaginally and leave in place for 3 consecutive weeks, then remove for 1 week.     . fenofibrate micronized (LOFIBRA) 67 MG capsule TAKE 1 CAPSULE BY MOUTH DAILY BEFORE BREAKFAST. 90 capsule 3  . fluticasone (FLONASE) 50 MCG/ACT nasal spray Place 2 sprays into both nostrils 2 (two) times daily as needed. 16 g 5  . gabapentin (NEURONTIN) 600 MG tablet Take 900 mg by mouth  BID    . ibuprofen (ADVIL,MOTRIN) 600 MG tablet Take 1 tablet (600 mg total) by mouth every 6 (six) hours as needed. 60 tablet 0  . montelukast (SINGULAIR) 10 MG tablet Take 1 tablet (10 mg total) by mouth at bedtime. 90 tablet 3  . Multiple Vitamin (MULTIVITAMIN) tablet Take 1 tablet by mouth daily.      Marland Kitchen olopatadine (PATANOL) 0.1 % ophthalmic solution Place 1 drop into both eyes 2 (two) times daily. 5 mL 3  . Omega-3 Fatty Acids (FISH OIL TRIPLE STRENGTH) 1400 MG CAPS Take 1 capsule by mouth 2 (two) times daily.    . simvastatin (ZOCOR) 40 MG tablet Take 1 tablet (40 mg total) by mouth at bedtime. 90 tablet 3  . vitamin C (ASCORBIC ACID) 500 MG tablet Take 500 mg by mouth daily.      Marland Kitchen zolpidem (AMBIEN) 10 MG tablet Take 1 tablet (10 mg total) by mouth  at bedtime as needed for sleep. 30 tablet 3   No current facility-administered medications on file prior to visit.    Review of Systems  Constitutional: Negative for fever and chills.  Skin: Positive for rash.      Objective:    BP 136/74 mmHg  Pulse 100  Temp(Src) 98.2 F (36.8 C) (Oral)  Resp 18  Ht 5\' 10"  (1.778 m)  Wt 245 lb (111.131 kg)  BMI 35.15 kg/m2  SpO2 96% Nursing note and vital signs reviewed.  Physical Exam  Constitutional: She is oriented to person, place, and time. She appears well-developed and well-nourished. No distress.  Cardiovascular: Normal rate, regular rhythm, normal heart sounds and intact distal pulses.   Pulmonary/Chest: Effort normal and breath sounds normal.  Neurological: She is alert and oriented to person, place,  and time.  Skin: Skin is warm and dry.     Psychiatric: She has a normal mood and affect. Her behavior is normal. Judgment and thought content normal.       Assessment & Plan:   Problem List Items Addressed This Visit      Musculoskeletal and Integument   Rash and nonspecific skin eruption - Primary    Rash of questionable origin with home incision and drainage. Start keflex to cover for infection. Continue over the counter antibiotic cream as needed and keep clean with soap and water. Follow up if symptoms worsen or fail to improve.       Relevant Medications   cephALEXin (KEFLEX) 500 MG capsule

## 2015-07-14 ENCOUNTER — Telehealth (HOSPITAL_COMMUNITY): Payer: Self-pay

## 2015-07-14 DIAGNOSIS — F331 Major depressive disorder, recurrent, moderate: Secondary | ICD-10-CM

## 2015-07-14 NOTE — Telephone Encounter (Signed)
Medication management - Spoke with pharmacist at [facility] and patient is requesting a new Wellbutrin XL order be e-scribed into their pharmacy for a 90 day supply.

## 2015-07-19 MED ORDER — BUPROPION HCL ER (XL) 300 MG PO TB24: 300.0000 mg | ORAL_TABLET | Freq: Every day | ORAL | Status: DC

## 2015-07-19 NOTE — Telephone Encounter (Signed)
Medication management - Met with Dr. Casimiro Needle who approved a new 90 day order be called in for patient's requested Wellbutrin XL 316m, one a day with no refills.  Called in new 90 day order [facility] and requested pharmacist make sure they reviewed with patient she now has the 3060mdosage in place of 2 of the 1506meach morning she was previously taking.

## 2015-07-28 ENCOUNTER — Ambulatory Visit (HOSPITAL_COMMUNITY): Payer: Self-pay | Admitting: Psychiatry

## 2015-08-04 ENCOUNTER — Ambulatory Visit (INDEPENDENT_AMBULATORY_CARE_PROVIDER_SITE_OTHER): Payer: 59 | Admitting: Psychiatry

## 2015-08-04 VITALS — BP 136/80 | HR 88 | Ht 70.0 in | Wt 245.0 lb

## 2015-08-04 DIAGNOSIS — F3340 Major depressive disorder, recurrent, in remission, unspecified: Secondary | ICD-10-CM | POA: Diagnosis not present

## 2015-08-04 DIAGNOSIS — F331 Major depressive disorder, recurrent, moderate: Secondary | ICD-10-CM

## 2015-08-04 MED ORDER — ESCITALOPRAM OXALATE 10 MG PO TABS: ORAL_TABLET | ORAL | Status: DC

## 2015-08-04 MED ORDER — BUPROPION HCL ER (XL) 150 MG PO TB24: ORAL_TABLET | ORAL | Status: DC

## 2015-08-04 NOTE — Progress Notes (Signed)
[facility] MD Progress Note  08/04/2015 9:53 AM April [name]  MRN:  [id number] Subjective: Doing great Principal Problem: Depression Diagnosis:  Major depression, recurrent, remission At this time the patient is doing very well. Her mood is good. She enjoys work. She sleeping and eating very well. She concentrates well. She uses no alcohol and no drugs. She is positive about life. She's been married 9 years in a good stable marriage. Her last efforts were to try to deal with her sexual dysfunction. She both lost the ability to have a drive but also the ability to have an orgasm. On her last visit we prescribed Wellbutrin but she could not get up to the full dose of 300 mg. At this time she is taking the 150 XL and she says it does help her achieve orgasms. She still claims however her sex drive is reduced taking the Lexapro. Overall the patient is positive optimistic and doing very very well. She's not suicidal. She is performing very well at work and functioning well in every way. Patient Active Problem List   Diagnosis Date Noted  . Rash and nonspecific skin eruption [R21] 07/11/2015  . Routine general medical examination at a health care facility [Z00.00] 05/04/2015  . Left hamstring injury [S76.302A] 01/24/2015  . Major depressive disorder, recurrent episode, moderate [F33.1] 12/02/2014  . Occipital neuralgia [M54.81]   . ANEMIA-NOS [D64.9] 12/11/2009  . Hyperlipidemia [E78.5] 11/16/2008  . Obesity [E66.9] 11/16/2008  . ANXIETY DEPRESSION [F34.1] 11/16/2008  . ALLERGIC RHINITIS [J30.9] 11/16/2008  . Esophageal reflux [K21.9] 11/16/2008  . Constipation [K59.00] 11/16/2008  . Palpitations [R00.2] 11/16/2008   Total Time spent with patient: 15 minutes   Past Medical History:  Past Medical History  Diagnosis Date  . ALLERGIC RHINITIS   . ANXIETY DEPRESSION   . CONSTIPATION   . DEPRESSION   . Esophageal reflux   . HYPERCHOLESTEROLEMIA   . OBESITY   . Palpitations   . Occipital  neuralgia     Past Surgical History  Procedure Laterality Date  . No past surgeries     Family History:  Family History  Problem Relation Age of Onset  . Hypertension Mother   . Diabetes Mother   . Hyperlipidemia Mother   . Breast cancer Mother 71  . Osteoarthritis Mother   . Hyperlipidemia Father   . Hyperlipidemia Brother   . Coronary artery disease Father     MI age 42   Social History:  History  Alcohol Use No     History  Drug Use No    Social History   Social History  . Marital Status: Married    Spouse Name: N/A  . Number of Children: N/A  . Years of Education: N/A   Social History Main Topics  . Smoking status: Never Smoker   . Smokeless tobacco: Not on file     Comment: Married, lives with spouse  . Alcohol Use: No  . Drug Use: No  . Sexual Activity: Yes    Birth Control/ Protection: Pill   Other Topics Concern  . Not on file   Social History Narrative   Additional History:    Sleep: Good  Appetite:  Good   Assessment:   Musculoskeletal: Strength & Muscle Tone: within normal limits Gait & Station: normal Patient leans: Right   Psychiatric Specialty Exam: Physical Exam  ROS  Blood pressure 136/80, pulse 88, height 5\' 10"  (1.778 m), weight 245 lb (111.131 kg).Body mass index is 35.15 kg/(m^2).  General  Appearance normal   Eye Contact::  Good  Speech:  Clear and Coherent  Volume:  Normal  Mood:  NA  Affect:  Appropriate  Thought Process:  Coherent  Orientation:  Full (Time, Place, and Person)  Thought Content:  WDL  Suicidal Thoughts:  No  Homicidal Thoughts:  No  Memory:  NA  Judgement:  Good  Insight:  Good  Psychomotor Activity:  Normal  Concentration:  Good  Recall:  Good  Fund of Knowledge:Good  Language: Good  Akathisia:  No  Handed:  Right  AIMS (if indicated):     Assets:  Desire for Improvement  ADL's:  Intact  Cognition: WNL  Sleep:        Current Medications: Current Outpatient Prescriptions  Medication  Sig Dispense Refill  . albuterol (PROAIR HFA) 108 (90 BASE) MCG/ACT inhaler Inhale 2 puffs into the lungs every 6 (six) hours as needed for wheezing or shortness of breath. 1 Inhaler 0  . buPROPion (WELLBUTRIN XL) 150 MG 24 hr tablet 1 qam 30 tablet 8  . cephALEXin (KEFLEX) 500 MG capsule Take 1 capsule (500 mg total) by mouth 2 (two) times daily. 14 capsule 0  . cetirizine (ZYRTEC) 10 MG tablet Take 10 mg by mouth daily.      . clonazePAM (KLONOPIN) 0.5 MG tablet Take 1 tablet (0.5 mg total) by mouth 2 (two) times daily as needed for anxiety. 60 tablet 3  . Diclofenac Sodium 2 % SOLN Apply 1 pump twice daily. 112 g 3  . docusate sodium (COLACE) 100 MG capsule Take 100 mg by mouth 2 (two) times daily.    Marland Kitchen escitalopram (LEXAPRO) 10 MG tablet 2 qam 60 tablet 8  . esomeprazole (NEXIUM) 40 MG capsule Take 1 capsule (40 mg total) by mouth daily. 90 capsule 3  . etonogestrel-ethinyl estradiol (NUVARING) 0.12-0.015 MG/24HR vaginal ring Place 1 each vaginally every 28 (twenty-eight) days. Insert vaginally and leave in place for 3 consecutive weeks, then remove for 1 week.     . fenofibrate micronized (LOFIBRA) 67 MG capsule Take 1 capsule (67 mg total) by mouth daily before breakfast. 90 capsule 3  . fluticasone (FLONASE) 50 MCG/ACT nasal spray Place 2 sprays into both nostrils 2 (two) times daily as needed. 16 g 5  . gabapentin (NEURONTIN) 600 MG tablet Take 900 mg by mouth  BID    . ibuprofen (ADVIL,MOTRIN) 600 MG tablet Take 1 tablet (600 mg total) by mouth every 6 (six) hours as needed. 60 tablet 0  . montelukast (SINGULAIR) 10 MG tablet Take 1 tablet (10 mg total) by mouth at bedtime. 90 tablet 3  . Multiple Vitamin (MULTIVITAMIN) tablet Take 1 tablet by mouth daily.      Marland Kitchen olopatadine (PATANOL) 0.1 % ophthalmic solution Place 1 drop into both eyes 2 (two) times daily. 5 mL 3  . Omega-3 Fatty Acids (FISH OIL TRIPLE STRENGTH) 1400 MG CAPS Take 1 capsule by mouth 2 (two) times daily.    . simvastatin  (ZOCOR) 40 MG tablet Take 1 tablet (40 mg total) by mouth at bedtime. 90 tablet 3  . simvastatin (ZOCOR) 40 MG tablet Take 1 tablet (40 mg total) by mouth at bedtime. 90 tablet 3  . vitamin C (ASCORBIC ACID) 500 MG tablet Take 500 mg by mouth daily.      Marland Kitchen zolpidem (AMBIEN) 10 MG tablet Take 1 tablet (10 mg total) by mouth at bedtime as needed for sleep. 30 tablet 3   No current facility-administered medications  for this visit.    Lab Results: No results found for this or any previous visit (from the past 48 hour(s)).  Physical Findings: AIMS:  , ,  ,  ,    CIWA:    COWS:     Treatment Plan Summary: At this time the patient is doing very well. Her last intervention will be to continue her Wellbutrin at 150 but will reduce her Lexapro down to 10 mg for the 3 weeks that she's not experiencing her menstrual period. She says that in fact when she is are round time where she cycles she feels much worse and needs to be on 20 mg of Lexapro. Therefore she'll continue taking Wellbutrin at 150 and take Lexapro 10 mg for 3 out of 4 weeks. The patient is not in therapy at this time. This patient to return to see me in 5 months.   Medical Decision Making:  Established Problem, Stable/Improving (1)     PLOVSKY, GERALD IRVING 08/04/2015, 9:53 AM

## 2015-08-07 ENCOUNTER — Telehealth (HOSPITAL_COMMUNITY): Payer: Self-pay

## 2015-08-07 DIAGNOSIS — F331 Major depressive disorder, recurrent, moderate: Secondary | ICD-10-CM

## 2015-08-07 NOTE — Telephone Encounter (Signed)
Medication refill request - Fax request refill for a 90 day supply of patient's Lexapro.  Patient would like to begin getting Lexapro 90 days at a time.  Pharamcy requests a new order for 90 days.

## 2015-08-10 MED ORDER — ESCITALOPRAM OXALATE 10 MG PO TABS: ORAL_TABLET | ORAL | Status: DC

## 2015-08-10 NOTE — Telephone Encounter (Signed)
Met with Dr. Casimiro Needle to discuss received request from patient's pharmacy to change her recent order to a 90 day supply of Lexapro.  Dr. Casimiro Needle approved Lexapro 64m, 2 daily, #180 with one refill.  [facility]ed [facility] new [facility] to [facility] pharmacist and canceled out pervious 30 day orders from 08/04/15 per Dr. PKaren Chafeorder this date.

## 2015-08-11 ENCOUNTER — Telehealth (HOSPITAL_COMMUNITY): Payer: Self-pay

## 2015-08-11 DIAGNOSIS — F331 Major depressive disorder, recurrent, moderate: Secondary | ICD-10-CM

## 2015-08-11 MED ORDER — BUPROPION HCL ER (XL) 150 MG PO TB24: ORAL_TABLET | ORAL | Status: DC

## 2015-08-11 NOTE — Telephone Encounter (Signed)
Telephone call with [facility] after patient left a message they did not have recent order Dr. Casimiro Needle was to be calling in for her medication.  Questioned pharmacist if they had orders from Dr. Casimiro Needle from 08/04/15 as patient also wanted to change the new Wellbutrin 150mg  to a 90 day supply.  Dr. Casimiro Needle approved changes to 90 day supply of medications for patient with one refill 08/10/15 so gave Caryl Pina, pharmacist at [facility] this verbal order approved by Dr. Doran Clay.  Called patient to inform her pharmacy had the 90 day order for her Wellbutrin XL 90 day supply at 150mg  dosage as was ordered 08/04/15 by Dr. Casimiro Needle.

## 2015-08-31 ENCOUNTER — Encounter: Payer: Self-pay | Admitting: Podiatry

## 2015-08-31 ENCOUNTER — Ambulatory Visit (INDEPENDENT_AMBULATORY_CARE_PROVIDER_SITE_OTHER): Payer: 59 | Admitting: Podiatry

## 2015-08-31 ENCOUNTER — Ambulatory Visit (INDEPENDENT_AMBULATORY_CARE_PROVIDER_SITE_OTHER): Payer: 59

## 2015-08-31 VITALS — BP 147/93 | HR 82 | Resp 16

## 2015-08-31 DIAGNOSIS — M79672 Pain in left foot: Secondary | ICD-10-CM

## 2015-08-31 DIAGNOSIS — M79671 Pain in right foot: Secondary | ICD-10-CM | POA: Diagnosis not present

## 2015-08-31 DIAGNOSIS — M722 Plantar fascial fibromatosis: Secondary | ICD-10-CM | POA: Diagnosis not present

## 2015-08-31 MED ORDER — TRIAMCINOLONE ACETONIDE 10 MG/ML IJ SUSP
10.0000 mg | Freq: Once | INTRAMUSCULAR | Status: AC
  Administered 2015-08-31: 10 mg

## 2015-08-31 NOTE — Patient Instructions (Signed)

## 2015-09-01 NOTE — Progress Notes (Signed)
Subjective:     [name], female   DOB: [birth year], 39 y.o.   MRN: [id number]  HPI patient presents stating I'm developing pain in my left heel that's really been bothering me for the last 6 months   Review of Systems     Objective:   Physical Exam Neurovascular status intact muscle strength adequate with exquisite discomfort plantar aspect left heel at the insertional point of the tendon into the calcaneus    Assessment:     Plantar fasciitis left with inflammation and fluid around the medial band    Plan:     Reviewed condition and advised on physical therapy for condition and dispensed ankle brace for an with instructions on usage. I also injected the plantar fascia 3 mg Kenalog 5 mg Xylocaine and scanned for custom orthotics due to long-standing nature problems with feet

## 2015-09-22 ENCOUNTER — Ambulatory Visit: Payer: 59 | Admitting: *Deleted

## 2015-09-22 DIAGNOSIS — M722 Plantar fascial fibromatosis: Secondary | ICD-10-CM

## 2015-09-22 NOTE — Progress Notes (Signed)
[name], female   DOB: [birth year], 39 y.o.   MRN: [id number] Patient presents for orthotic pick up.  Verbal and written break in and wear instructions given.  Patient will follow up in 4 weeks if symptoms worsen or fail to improve.

## 2015-09-22 NOTE — Patient Instructions (Signed)

## 2015-10-16 ENCOUNTER — Ambulatory Visit (INDEPENDENT_AMBULATORY_CARE_PROVIDER_SITE_OTHER): Payer: 59 | Admitting: Psychology

## 2015-10-16 DIAGNOSIS — F411 Generalized anxiety disorder: Secondary | ICD-10-CM

## 2015-10-19 ENCOUNTER — Ambulatory Visit: Payer: 59 | Admitting: Podiatry

## 2015-11-28 ENCOUNTER — Ambulatory Visit (INDEPENDENT_AMBULATORY_CARE_PROVIDER_SITE_OTHER): Payer: 59 | Admitting: Psychology

## 2015-11-28 DIAGNOSIS — F411 Generalized anxiety disorder: Secondary | ICD-10-CM

## 2015-12-11 ENCOUNTER — Encounter: Payer: Self-pay | Admitting: Gastroenterology

## 2015-12-14 MED FILL — ESOMEPRAZOLE MAG DR 40 MG C: 40 | 90 days supply | Qty: 90 | Fill #2

## 2015-12-14 MED FILL — GABAPENTIN 600 MG TABLET: 600 | 30 days supply | Qty: 90 | Fill #3

## 2015-12-28 ENCOUNTER — Encounter: Payer: Self-pay | Admitting: Internal Medicine

## 2015-12-28 MED ORDER — ALBUTEROL SULFATE HFA 108 (90 BASE) MCG/ACT IN AERS: 2.0000 | INHALATION_SPRAY | Freq: Four times a day (QID) | RESPIRATORY_TRACT | Status: DC | PRN

## 2015-12-28 MED FILL — VENTOLIN HFA 90 MCG INHALER: 108 (90 BAS | 25 days supply | Qty: 18 | Fill #0

## 2016-01-03 MED FILL — NUVARING VAGINAL RING: 0.12-0.015 | 84 days supply | Qty: 3 | Fill #3

## 2016-01-10 ENCOUNTER — Encounter (HOSPITAL_COMMUNITY): Payer: Self-pay | Admitting: Psychiatry

## 2016-01-10 ENCOUNTER — Ambulatory Visit (INDEPENDENT_AMBULATORY_CARE_PROVIDER_SITE_OTHER): Payer: 59 | Admitting: Psychiatry

## 2016-01-10 VITALS — BP 153/85 | HR 101 | Ht 70.0 in | Wt 257.2 lb

## 2016-01-10 DIAGNOSIS — F331 Major depressive disorder, recurrent, moderate: Secondary | ICD-10-CM

## 2016-01-10 DIAGNOSIS — F3342 Major depressive disorder, recurrent, in full remission: Secondary | ICD-10-CM

## 2016-01-10 MED ORDER — ZOLPIDEM TARTRATE 10 MG PO TABS: 10.0000 mg | ORAL_TABLET | Freq: Every evening | ORAL | Status: DC | PRN

## 2016-01-10 MED ORDER — ESCITALOPRAM OXALATE 10 MG PO TABS: ORAL_TABLET | ORAL | Status: DC

## 2016-01-10 MED ORDER — BUPROPION HCL ER (XL) 150 MG PO TB24: ORAL_TABLET | ORAL | Status: DC

## 2016-01-10 MED FILL — ESCITALOPRAM 10 MG TABLET: 10 | 90 days supply | Qty: 180 | Fill #0

## 2016-01-10 NOTE — Progress Notes (Signed)
[facility] MD Progress Note  01/10/2016 1:39 PM April [name]  MRN:  [id number] Subjective: Doing great Principal Problem: Major depression, recurrent, remission Diagnosis:  Major depression, recurrent, remission Today the patient is doing very well. 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 She does well job well. She concentrate without any problems. She denies daily depression. She sleeping and eating very well. She's got good energy. She has a dog and a cat she enjoys them. She enjoys reading and is very active. The patient denies the use of alcohol or of illicit drugs. Her sex drive is much improved. She is able now to have an orgasm. The patient takes 10 mg of Lexapro every day and takes a 20 mg pill the week before her menstrual period which helps her mood is great deal. The Wellbutrin is also beneficial. The patient is very positive about life. She is active and has a good relationship with her husband. The patient denies any chest pain or shortness of breath. She denies any neurological symptoms. She is medically very well. Patient Active Problem List   Diagnosis Date Noted  . Rash and nonspecific skin eruption [R21] 07/11/2015  . Routine general medical examination at a health care facility [Z00.00] 05/04/2015  . Left hamstring injury [S76.302A] 01/24/2015  . Major depressive disorder, recurrent episode, moderate ([place]) [F33.1] 12/02/2014  . Occipital neuralgia [M54.81]   . ANEMIA-NOS [D64.9] 12/11/2009  . Hyperlipidemia [E78.5] 11/16/2008  . Obesity [E66.9] 11/16/2008  . ANXIETY DEPRESSION [F34.1] 11/16/2008  . ALLERGIC RHINITIS [J30.9] 11/16/2008  . Esophageal reflux [K21.9] 11/16/2008  . Constipation [K59.00] 11/16/2008  . Palpitations [R00.2] 11/16/2008   Total Time spent with patient: 30 minutes  Past Psychiatric History:   Past Medical History:   Past Medical History  Diagnosis Date  . ALLERGIC RHINITIS   . ANXIETY DEPRESSION   . CONSTIPATION   . DEPRESSION   . Esophageal reflux   . HYPERCHOLESTEROLEMIA   . OBESITY   . Palpitations   . Occipital neuralgia     Past Surgical History  Procedure Laterality Date  . No past surgeries     Family History:  Family History  Problem Relation Age of Onset  . Hypertension Mother   . Diabetes Mother   . Hyperlipidemia Mother   . Breast cancer Mother 34  . Osteoarthritis Mother   . Hyperlipidemia Father   . Hyperlipidemia Brother   . Coronary artery disease Father     MI age 48   Family Psychiatric  History:  Social History:  History  Alcohol Use No     History  Drug Use No    Social History   Social History  . Marital Status: Married    Spouse Name: N/A  . Number of Children: N/A  . Years of Education: N/A   Social History Main Topics  . Smoking status: Never Smoker   . Smokeless tobacco: None     Comment: Married, lives with spouse  . Alcohol Use: No  . Drug Use: No  . Sexual Activity: Yes    Birth Control/ Protection: Pill   Other Topics Concern  . None   Social History Narrative   Additional Social History:                         Sleep: Good  Appetite:  Good  Current Medications: Current Outpatient Prescriptions  Medication Sig Dispense Refill  . albuterol (PROAIR HFA) 108 (90 Base) MCG/ACT inhaler Inhale 2 puffs into the lungs every 6 (six) hours as needed for wheezing or shortness of breath. 1 Inhaler 0  . buPROPion (WELLBUTRIN XL) 150 MG 24 hr tablet Take 1 tablet (150 mg total) by mouth each morning. 90 tablet 1  . cephALEXin (KEFLEX) 500 MG capsule Take 1 capsule (500 mg total) by mouth 2 (two) times daily. 14 capsule 0  . cetirizine (ZYRTEC) 10 MG tablet Take 10 mg by mouth daily.      . clonazePAM (KLONOPIN) 0.5 MG tablet Take 1 tablet (0.5 mg total) by mouth 2 (two) times daily as needed for anxiety. 60 tablet 3  . Diclofenac  Sodium 2 % SOLN Apply 1 pump twice daily. 112 g 3  . docusate sodium (COLACE) 100 MG capsule Take 100 mg by mouth 2 (two) times daily.    Marland Kitchen escitalopram (LEXAPRO) 10 MG tablet Take two tablets (20 mg total) by mouth daily. 180 tablet 1  . esomeprazole (NEXIUM) 40 MG capsule Take 1 capsule (40 mg total) by mouth daily. 90 capsule 3  . etonogestrel-ethinyl estradiol (NUVARING) 0.12-0.015 MG/24HR vaginal ring Place 1 each vaginally every 28 (twenty-eight) days. Insert vaginally and leave in place for 3 consecutive weeks, then remove for 1 week.     . fenofibrate micronized (LOFIBRA) 67 MG capsule Take 1 capsule (67 mg total) by mouth daily before breakfast. 90 capsule 3  . fluticasone (FLONASE) 50 MCG/ACT nasal spray Place 2 sprays into both nostrils 2 (two) times daily as needed. 16 g 5  . gabapentin (NEURONTIN) 600 MG tablet Take 900 mg by mouth  BID    . ibuprofen (ADVIL,MOTRIN) 600 MG tablet Take 1 tablet (600 mg total) by mouth every 6 (six) hours as needed. 60 tablet 0  . montelukast (SINGULAIR) 10 MG tablet Take 1 tablet (10 mg total) by mouth at bedtime. 90 tablet 3  . Multiple Vitamin (MULTIVITAMIN) tablet Take 1 tablet by mouth daily.      Marland Kitchen olopatadine (PATANOL) 0.1 % ophthalmic solution Place 1 drop into both eyes 2 (two) times daily. 5 mL 3  . Omega-3 Fatty Acids (FISH OIL TRIPLE STRENGTH) 1400 MG CAPS Take 1 capsule by mouth 2 (two) times daily.    . simvastatin (ZOCOR) 40 MG tablet Take 1 tablet (40 mg total) by mouth at bedtime. 90 tablet 3  . simvastatin (ZOCOR) 40 MG tablet Take 1 tablet (40 mg total) by mouth at bedtime. 90 tablet 3  . vitamin C (ASCORBIC ACID) 500 MG tablet Take 500 mg by mouth daily.      Marland Kitchen zolpidem (AMBIEN) 10 MG tablet Take 1 tablet (10 mg total) by mouth at bedtime as needed for sleep. 30 tablet 3   No current facility-administered medications for this visit.    Lab Results: No results found for this or any previous visit (from the past 48 hour(s)).  Blood  Alcohol level:  No results found for: [facility]  Physical Findings: AIMS:  , ,  ,  ,    CIWA:    COWS:     Musculoskeletal: Strength & Muscle Tone: within normal limits Gait & Station: normal Patient leans: N/A  Psychiatric Specialty Exam: ROS  Blood pressure 153/85, pulse 101, height 5\' 10"  (1.778 m), weight 257 lb 3.2 oz (116.665 kg).Body mass index is 36.9 kg/(m^2).  General Appearance: Casual  Eye  Contact::  Good  Speech:  Clear and Coherent  Volume:  Normal  Mood:  Euthymic  Affect:  Appropriate  Thought Process:  Coherent  Orientation:  Full (Time, Place, and Person)  Thought Content:  WDL  Suicidal Thoughts:  No  Homicidal Thoughts:  No  Memory:  NA  Judgement:  Good  Insight:  Good  Psychomotor Activity:  Normal  Concentration:  Good  Recall:  Good  Fund of Knowledge:Good  Language: Good  Akathisia:  No  Handed:  Right  AIMS (if indicated):     Assets:  Desire for Improvement  ADL's:  Intact  Cognition: WNL  Sleep:      Treatment Plan Summary: At this time the patient is doing very well. She'll continue taking Lexapro 10 mg every day in one week a month will take 20 mg around her menstrual.. This seems to work well for her she takes Wellbutrin 150 mg 1 a day and together her depression is well-controlled and her sex drive is normalized. The patient has good energy she is focused and is doing great at work. Her marriage is very stable. This patient to return to see me in the next few months. She is not suicidal not homicidal and denies any medical problems at this time. She denies any neurological symptoms.  Haskel Schroeder, MD 01/10/2016, 1:39 PM

## 2016-01-12 MED FILL — GABAPENTIN 600 MG TABLET: 600 | 30 days supply | Qty: 90 | Fill #4

## 2016-01-12 MED FILL — SIMVASTATIN 40 MG TABLET: 40 | 90 days supply | Qty: 90 | Fill #2

## 2016-01-12 MED FILL — FENOFIBRATE 67 MG CAPSULE: 67 | 90 days supply | Qty: 90 | Fill #2

## 2016-02-12 MED FILL — GABAPENTIN 600 MG TABLET: 600 | 30 days supply | Qty: 90 | Fill #5

## 2016-02-12 MED FILL — BUPROPION HCL XL 150 MG TAB: 150 | 90 days supply | Qty: 90 | Fill #0

## 2016-02-12 MED FILL — MONTELUKAST SOD 10 MG TAB: 10 | 90 days supply | Qty: 90 | Fill #3

## 2016-02-20 DIAGNOSIS — G43719 Chronic migraine without aura, intractable, without status migrainosus: Secondary | ICD-10-CM | POA: Diagnosis not present

## 2016-02-20 DIAGNOSIS — R51 Headache: Secondary | ICD-10-CM | POA: Diagnosis not present

## 2016-02-20 DIAGNOSIS — Z79899 Other long term (current) drug therapy: Secondary | ICD-10-CM | POA: Diagnosis not present

## 2016-03-25 MED FILL — NUVARING VAGINAL RING: 0.12-0.015 | 84 days supply | Qty: 3 | Fill #4

## 2016-03-26 MED FILL — GABAPENTIN 600 MG TABLET: 600 | 30 days supply | Qty: 90 | Fill #0

## 2016-04-08 MED FILL — ESCITALOPRAM 10 MG TABLET: 10 | 90 days supply | Qty: 180 | Fill #1

## 2016-04-08 MED FILL — SIMVASTATIN 40 MG TABLET: 40 | 90 days supply | Qty: 90 | Fill #3

## 2016-04-08 MED FILL — ESOMEPRAZOLE MAG DR 40 MG C: 40 | 90 days supply | Qty: 90 | Fill #3

## 2016-04-08 MED FILL — FENOFIBRATE 67 MG CAPSULE: 67 | 90 days supply | Qty: 90 | Fill #3

## 2016-04-23 MED FILL — GABAPENTIN 600 MG TABLET: 600 | 30 days supply | Qty: 90 | Fill #1

## 2016-04-29 DIAGNOSIS — L292 Pruritus vulvae: Secondary | ICD-10-CM | POA: Diagnosis not present

## 2016-04-29 DIAGNOSIS — Z01419 Encounter for gynecological examination (general) (routine) without abnormal findings: Secondary | ICD-10-CM | POA: Diagnosis not present

## 2016-04-29 DIAGNOSIS — Z6835 Body mass index (BMI) 35.0-35.9, adult: Secondary | ICD-10-CM | POA: Diagnosis not present

## 2016-04-29 MED FILL — FLUCONAZOLE 150 MG TABLET: 150 | 3 days supply | Qty: 3 | Fill #0

## 2016-05-03 ENCOUNTER — Encounter: Payer: Self-pay | Admitting: Internal Medicine

## 2016-05-03 ENCOUNTER — Ambulatory Visit (INDEPENDENT_AMBULATORY_CARE_PROVIDER_SITE_OTHER): Payer: 59 | Admitting: Internal Medicine

## 2016-05-03 ENCOUNTER — Telehealth: Payer: Self-pay

## 2016-05-03 ENCOUNTER — Other Ambulatory Visit (INDEPENDENT_AMBULATORY_CARE_PROVIDER_SITE_OTHER): Payer: 59

## 2016-05-03 VITALS — BP 136/82 | HR 82 | Temp 98.3°F | Resp 20 | Wt 248.0 lb

## 2016-05-03 DIAGNOSIS — Z0000 Encounter for general adult medical examination without abnormal findings: Secondary | ICD-10-CM

## 2016-05-03 DIAGNOSIS — R739 Hyperglycemia, unspecified: Secondary | ICD-10-CM | POA: Insufficient documentation

## 2016-05-03 DIAGNOSIS — R6889 Other general symptoms and signs: Secondary | ICD-10-CM

## 2016-05-03 DIAGNOSIS — F331 Major depressive disorder, recurrent, moderate: Secondary | ICD-10-CM

## 2016-05-03 DIAGNOSIS — Z0001 Encounter for general adult medical examination with abnormal findings: Secondary | ICD-10-CM | POA: Diagnosis not present

## 2016-05-03 LAB — HEPATIC FUNCTION PANEL
ALK PHOS: 73 U/L (ref 39–117)
ALT: 12 U/L (ref 0–35)
AST: 12 U/L (ref 0–37)
Albumin: 4 g/dL (ref 3.5–5.2)
BILIRUBIN DIRECT: 0.1 mg/dL (ref 0.0–0.3)
BILIRUBIN TOTAL: 0.3 mg/dL (ref 0.2–1.2)
Total Protein: 7.6 g/dL (ref 6.0–8.3)

## 2016-05-03 LAB — URINALYSIS, ROUTINE W REFLEX MICROSCOPIC
BILIRUBIN URINE: NEGATIVE
HGB URINE DIPSTICK: NEGATIVE
Ketones, ur: NEGATIVE
Leukocytes, UA: NEGATIVE
NITRITE: NEGATIVE
PH: 8 (ref 5.0–8.0)
Specific Gravity, Urine: 1.01 (ref 1.000–1.030)
TOTAL PROTEIN, URINE-UPE24: NEGATIVE
URINE GLUCOSE: NEGATIVE
UROBILINOGEN UA: 0.2 (ref 0.0–1.0)

## 2016-05-03 LAB — BASIC METABOLIC PANEL
BUN: 11 mg/dL (ref 6–23)
CALCIUM: 9.6 mg/dL (ref 8.4–10.5)
CHLORIDE: 105 meq/L (ref 96–112)
CO2: 26 mEq/L (ref 19–32)
CREATININE: 0.83 mg/dL (ref 0.40–1.20)
GFR: 81.18 mL/min (ref >60.00)
Glucose, Bld: 84 mg/dL (ref 70–99)
Potassium: 4.4 mEq/L (ref 3.5–5.1)
SODIUM: 138 meq/L (ref 135–145)

## 2016-05-03 LAB — CBC WITH DIFFERENTIAL/PLATELET
BASOS ABS: 0.1 10*3/uL (ref 0.0–0.1)
Basophils Relative: 0.6 % (ref 0.0–3.0)
EOS ABS: 0.4 10*3/uL (ref 0.0–0.7)
Eosinophils Relative: 4 % (ref 0.0–5.0)
HEMATOCRIT: 39.2 % (ref 36.0–46.0)
HEMOGLOBIN: 13.4 g/dL (ref 12.0–15.0)
LYMPHS PCT: 23.2 % (ref 12.0–46.0)
Lymphs Abs: 2.3 10*3/uL (ref 0.7–4.0)
MCHC: 34.3 g/dL (ref 30.0–36.0)
MCV: 84 fl (ref 78.0–100.0)
Monocytes Absolute: 0.7 10*3/uL (ref 0.1–1.0)
Monocytes Relative: 6.6 % (ref 3.0–12.0)
Neutro Abs: 6.5 10*3/uL (ref 1.4–7.7)
Neutrophils Relative %: 65.6 % (ref 43.0–77.0)
Platelets: 376 10*3/uL (ref 150.0–400.0)
RBC: 4.67 Mil/uL (ref 3.87–5.11)
RDW: 13.8 % (ref 11.5–15.5)
WBC: 9.9 10*3/uL (ref 4.0–10.5)

## 2016-05-03 LAB — LIPID PANEL
CHOL/HDL RATIO: 3
Cholesterol: 152 mg/dL (ref 0–200)
HDL: 54.5 mg/dL (ref >39.00)
LDL Cholesterol: 69 mg/dL (ref 0–99)
NONHDL: 97.93
Triglycerides: 147 mg/dL (ref 0.0–149.0)
VLDL: 29.4 mg/dL (ref 0.0–40.0)

## 2016-05-03 LAB — TSH: TSH: 1.2 u[IU]/mL (ref 0.35–4.50)

## 2016-05-03 LAB — HEMOGLOBIN A1C: Hgb A1c MFr Bld: 5.6 % (ref 4.6–6.5)

## 2016-05-03 MED ORDER — SIMVASTATIN 40 MG PO TABS: 40.0000 mg | ORAL_TABLET | Freq: Every day | ORAL | Status: DC

## 2016-05-03 MED ORDER — BUPROPION HCL ER (XL) 150 MG PO TB24: ORAL_TABLET | ORAL | Status: DC

## 2016-05-03 MED ORDER — FLUTICASONE PROPIONATE 50 MCG/ACT NA SUSP: 2.0000 | Freq: Two times a day (BID) | NASAL | Status: DC | PRN

## 2016-05-03 MED ORDER — CETIRIZINE HCL 10 MG PO TABS: 10.0000 mg | ORAL_TABLET | Freq: Every day | ORAL | Status: AC

## 2016-05-03 MED ORDER — FENOFIBRATE MICRONIZED 67 MG PO CAPS: 67.0000 mg | ORAL_CAPSULE | Freq: Every day | ORAL | Status: DC

## 2016-05-03 MED ORDER — MONTELUKAST SODIUM 10 MG PO TABS: 10.0000 mg | ORAL_TABLET | Freq: Every day | ORAL | Status: DC

## 2016-05-03 MED ORDER — ESOMEPRAZOLE MAGNESIUM 40 MG PO CPDR: 40.0000 mg | DELAYED_RELEASE_CAPSULE | Freq: Every day | ORAL | Status: DC

## 2016-05-03 MED ORDER — OLOPATADINE HCL 0.1 % OP SOLN: 1.0000 [drp] | Freq: Two times a day (BID) | OPHTHALMIC | Status: DC

## 2016-05-03 MED ORDER — DOCUSATE SODIUM 100 MG PO CAPS
100.0000 mg | ORAL_CAPSULE | Freq: Two times a day (BID) | ORAL | Status: DC
Start: 2016-05-03 — End: 2017-03-07

## 2016-05-03 MED ORDER — ALBUTEROL SULFATE HFA 108 (90 BASE) MCG/ACT IN AERS: 2.0000 | INHALATION_SPRAY | Freq: Four times a day (QID) | RESPIRATORY_TRACT | Status: DC | PRN

## 2016-05-03 NOTE — Assessment & Plan Note (Signed)
Mild, strong FH dm, for a1c cont'd surveillance

## 2016-05-03 NOTE — Assessment & Plan Note (Signed)

## 2016-05-03 NOTE — Patient Instructions (Signed)

## 2016-05-03 NOTE — Progress Notes (Signed)
Pre visit review using our clinic review tool, if applicable. No additional management support is needed unless otherwise documented below in the visit note. 

## 2016-05-03 NOTE — Telephone Encounter (Signed)
Medication refills sent to pharmacy 

## 2016-05-03 NOTE — Progress Notes (Signed)
Subjective:    [name], female    DOB: 04/14/1976, 40 y.o.   MRN: [id number]  HPI  Here for wellness and f/u;  Overall doing ok;  Pt denies Chest pain, worsening SOB, DOE, wheezing, orthopnea, PND, worsening LE edema, palpitations, dizziness or syncope.  Pt denies neurological change such as new headache, facial or extremity weakness.  Pt denies polydipsia, polyuria, or low sugar symptoms. Pt states overall good compliance with treatment and medications, good tolerability, and has been trying to follow appropriate diet.  Pt denies worsening depressive symptoms, suicidal ideation or panic. No fever, night sweats, wt loss, loss of appetite, or other constitutional symptoms.  Pt states good ability with ADL's, has low fall risk, home safety reviewed and adequate, no other significant changes in hearing or vision, and only occasionally active with exercise. Sees Dr Casimiro Needle for depression.   Past Medical History  Diagnosis Date  . ALLERGIC RHINITIS   . ANXIETY DEPRESSION   . CONSTIPATION   . DEPRESSION   . Esophageal reflux   . HYPERCHOLESTEROLEMIA   . OBESITY   . Palpitations   . Occipital neuralgia    Past Surgical History  Procedure Laterality Date  . No past surgeries      reports that she has never smoked. She does not have any smokeless tobacco history on file. She reports that she does not drink alcohol or use illicit drugs. family history includes Breast cancer (age of onset: 14) in her mother; Coronary artery disease in her father; Diabetes in her mother; Hyperlipidemia in her brother, father, and mother; Hypertension in her mother; Osteoarthritis in her mother. No Known Allergies Current Outpatient Prescriptions on File Prior to Visit  Medication Sig Dispense Refill  . clonazePAM (KLONOPIN) 0.5 MG tablet Take 1 tablet (0.5 mg total) by mouth 2 (two) times daily as needed for anxiety. 60 tablet 3  . escitalopram (LEXAPRO) 10 MG tablet Take two tablets (20 mg  total) by mouth daily. 180 tablet 1  . etonogestrel-ethinyl estradiol (NUVARING) 0.12-0.015 MG/24HR vaginal ring Place 1 each vaginally every 28 (twenty-eight) days. Insert vaginally and leave in place for 3 consecutive weeks, then remove for 1 week.     . gabapentin (NEURONTIN) 600 MG tablet Take 900 mg by mouth  BID    . Multiple Vitamin (MULTIVITAMIN) tablet Take 1 tablet by mouth daily.      . simvastatin (ZOCOR) 40 MG tablet Take 1 tablet (40 mg total) by mouth at bedtime. 90 tablet 3  . vitamin C (ASCORBIC ACID) 500 MG tablet Take 500 mg by mouth daily.      Marland Kitchen zolpidem (AMBIEN) 10 MG tablet Take 1 tablet (10 mg total) by mouth at bedtime as needed for sleep. 30 tablet 3   No current facility-administered medications on file prior to visit.   Review of Systems Constitutional: Negative for increased diaphoresis, or other activity, appetite or siginficant weight change other than noted HENT: Negative for worsening hearing loss, ear pain, facial swelling, mouth sores and neck stiffness.   Eyes: Negative for other worsening pain, redness or visual disturbance.  Respiratory: Negative for choking or stridor Cardiovascular: Negative for other chest pain and palpitations.  Gastrointestinal: Negative for worsening diarrhea, blood in stool, or abdominal distention Genitourinary: Negative for hematuria, flank pain or change in urine volume.  Musculoskeletal: Negative for myalgias or other joint complaints.  Skin: Negative for other color change and wound or drainage.  Neurological: Negative for syncope and numbness. other  than noted Hematological: Negative for adenopathy. or other swelling Psychiatric/Behavioral: Negative for hallucinations, SI, self-injury, decreased concentration or other worsening agitation.      Objective:   Physical Exam BP 136/82 mmHg  Pulse 82  Temp(Src) 98.3 F (36.8 C) (Oral)  Resp 20  Wt 248 lb (112.492 kg)  SpO2 96% VS noted, obese Constitutional: Pt is  oriented to person, place, and time. Appears well-developed and well-nourished, in no significant distress Head: Normocephalic and atraumatic  Eyes: Conjunctivae and EOM are normal. Pupils are equal, round, and reactive to light Right Ear: External ear normal.  Left Ear: External ear normal Nose: Nose normal.  Mouth/Throat: Oropharynx is clear and moist  Neck: Normal range of motion. Neck supple. No JVD present. No tracheal deviation present or significant neck LA or mass Cardiovascular: Normal rate, regular rhythm, normal heart sounds and intact distal pulses.   Pulmonary/Chest: Effort normal and breath sounds without rales or wheezing  Abdominal: Soft. Bowel sounds are normal. NT. No HSM  Musculoskeletal: Normal range of motion. Exhibits no edema Lymphadenopathy: Has no cervical adenopathy.  Neurological: Pt is alert and oriented to person, place, and time. Pt has normal reflexes. No cranial nerve deficit. Motor grossly intact Skin: Skin is warm and dry. No rash noted or new ulcers Psychiatric:  Has normal mood and affect. Behavior is normal.      Assessment & Plan:

## 2016-05-10 ENCOUNTER — Ambulatory Visit: Payer: Self-pay | Admitting: Psychology

## 2016-05-13 ENCOUNTER — Ambulatory Visit (INDEPENDENT_AMBULATORY_CARE_PROVIDER_SITE_OTHER): Payer: 59 | Admitting: Psychology

## 2016-05-13 DIAGNOSIS — F331 Major depressive disorder, recurrent, moderate: Secondary | ICD-10-CM | POA: Diagnosis not present

## 2016-05-15 MED FILL — MONTELUKAST SOD 10 MG TAB: 10 | 90 days supply | Qty: 90 | Fill #0

## 2016-05-15 MED FILL — BUPROPION HCL XL 150 MG TAB: 150 | 90 days supply | Qty: 90 | Fill #1

## 2016-05-22 ENCOUNTER — Ambulatory Visit (INDEPENDENT_AMBULATORY_CARE_PROVIDER_SITE_OTHER): Payer: 59 | Admitting: Internal Medicine

## 2016-05-22 VITALS — BP 130/68 | HR 76 | Temp 98.6°F | Resp 20 | Wt 251.0 lb

## 2016-05-22 DIAGNOSIS — B965 Pseudomonas (aeruginosa) (mallei) (pseudomallei) as the cause of diseases classified elsewhere: Secondary | ICD-10-CM | POA: Insufficient documentation

## 2016-05-22 DIAGNOSIS — M546 Pain in thoracic spine: Secondary | ICD-10-CM

## 2016-05-22 DIAGNOSIS — R739 Hyperglycemia, unspecified: Secondary | ICD-10-CM

## 2016-05-22 DIAGNOSIS — L739 Follicular disorder, unspecified: Secondary | ICD-10-CM

## 2016-05-22 DIAGNOSIS — R21 Rash and other nonspecific skin eruption: Secondary | ICD-10-CM | POA: Diagnosis not present

## 2016-05-22 MED ORDER — CYCLOBENZAPRINE HCL 5 MG PO TABS: 5.0000 mg | ORAL_TABLET | Freq: Three times a day (TID) | ORAL | Status: DC | PRN

## 2016-05-22 MED ORDER — PREDNISONE 10 MG PO TABS: ORAL_TABLET | ORAL | Status: DC

## 2016-05-22 MED ORDER — TRIAMCINOLONE ACETONIDE 0.1 % EX CREA: 1.0000 "application " | TOPICAL_CREAM | Freq: Two times a day (BID) | CUTANEOUS | Status: DC

## 2016-05-22 MED ORDER — METHYLPREDNISOLONE ACETATE 80 MG/ML IJ SUSP: 80.0000 mg | Freq: Once | INTRAMUSCULAR | Status: DC

## 2016-05-22 NOTE — Patient Instructions (Signed)
You had the steroid shot today  Please take all new medication as prescribed - the prednisone, cream, and the muscle relaxer as needed  Please continue all other medications as before, includiing your ibuprofen as needed  Please have the pharmacy call with any other refills you may need.  Please keep your appointments with your specialists as you may have planned

## 2016-05-22 NOTE — Progress Notes (Signed)
Pre visit review using our clinic review tool, if applicable. No additional management support is needed unless otherwise documented below in the visit note. 

## 2016-05-22 NOTE — Progress Notes (Signed)
Subjective:    [name], female    DOB: 1975-11-23, 40 y.o.   MRN: [id number]  HPI  Here to f/u with c/o rash to arms with itching and mild discomfort, after spending time outdoors for the holiday, cannot recall any specific contact allergen so she thinks could be insect bites though cannot this as well.  Similar episode much milder last yr, with one area becoming infected, required antibx, and left scar to mid right anterior forearm  about 1 cm area.  No fever, other rash/ swelling, tongue swelling and Pt denies chest pain, increased sob or doe, wheezing, orthopnea, PND, increased LE swelling, palpitations, dizziness or syncope.   Pt denies polydipsia, polyuria,   Also with Pt c/o left mid/upper thoracic area back pain, dull and sharp, without bowel or bladder change, fever, wt loss,  worsening LE pain/numbness/weakness, gait change or falls. Worse to twist or bend or move the left arm in large movements. No fever  Past Medical History  Diagnosis Date  . ALLERGIC RHINITIS   . ANXIETY DEPRESSION   . CONSTIPATION   . DEPRESSION   . Esophageal reflux   . HYPERCHOLESTEROLEMIA   . OBESITY   . Palpitations   . Occipital neuralgia    Past Surgical History  Procedure Laterality Date  . No past surgeries      reports that she has never smoked. She does not have any smokeless tobacco history on file. She reports that she does not drink alcohol or use illicit drugs. family history includes Breast cancer (age of onset: 41) in her mother; Coronary artery disease in her father; Diabetes in her mother; Hyperlipidemia in her brother, father, and mother; Hypertension in her mother; Osteoarthritis in her mother. No Known Allergies Current Outpatient Prescriptions on File Prior to Visit  Medication Sig Dispense Refill  . albuterol (PROAIR HFA) 108 (90 Base) MCG/ACT inhaler Inhale 2 puffs into the lungs every 6 (six) hours as needed for wheezing or shortness of breath. 1 Inhaler 0    . buPROPion (WELLBUTRIN XL) 150 MG 24 hr tablet Take 1 tablet (150 mg total) by mouth each morning. 90 tablet 1  . cetirizine (ZYRTEC) 10 MG tablet Take 1 tablet (10 mg total) by mouth daily. 30 tablet 3  . clonazePAM (KLONOPIN) 0.5 MG tablet Take 1 tablet (0.5 mg total) by mouth 2 (two) times daily as needed for anxiety. 60 tablet 3  . docusate sodium (COLACE) 100 MG capsule Take 1 capsule (100 mg total) by mouth 2 (two) times daily. 10 capsule 3  . escitalopram (LEXAPRO) 10 MG tablet Take two tablets (20 mg total) by mouth daily. 180 tablet 1  . esomeprazole (NEXIUM) 40 MG capsule Take 1 capsule (40 mg total) by mouth daily. 90 capsule 3  . etonogestrel-ethinyl estradiol (NUVARING) 0.12-0.015 MG/24HR vaginal ring Place 1 each vaginally every 28 (twenty-eight) days. Insert vaginally and leave in place for 3 consecutive weeks, then remove for 1 week.     . fenofibrate micronized (LOFIBRA) 67 MG capsule Take 1 capsule (67 mg total) by mouth daily before breakfast. 90 capsule 3  . fluticasone (FLONASE) 50 MCG/ACT nasal spray Place 2 sprays into both nostrils 2 (two) times daily as needed. 16 g 5  . gabapentin (NEURONTIN) 600 MG tablet Take 900 mg by mouth  BID    . montelukast (SINGULAIR) 10 MG tablet Take 1 tablet (10 mg total) by mouth at bedtime. 90 tablet 3  . Multiple Vitamin (MULTIVITAMIN) tablet  Take 1 tablet by mouth daily.      Marland Kitchen olopatadine (PATANOL) 0.1 % ophthalmic solution Place 1 drop into both eyes 2 (two) times daily. 5 mL 3  . simvastatin (ZOCOR) 40 MG tablet Take 1 tablet (40 mg total) by mouth at bedtime. 90 tablet 3  . simvastatin (ZOCOR) 40 MG tablet Take 1 tablet (40 mg total) by mouth at bedtime. 90 tablet 3  . vitamin C (ASCORBIC ACID) 500 MG tablet Take 500 mg by mouth daily.      Marland Kitchen zolpidem (AMBIEN) 10 MG tablet Take 1 tablet (10 mg total) by mouth at bedtime as needed for sleep. 30 tablet 3   No current facility-administered medications on file prior to visit.   Review  of Systems  Constitutional: Negative for unusual diaphoresis or night sweats HENT: Negative for ear swelling or discharge Eyes: Negative for worsening visual haziness  Respiratory: Negative for choking and stridor.   Gastrointestinal: Negative for distension or worsening eructation Genitourinary: Negative for retention or change in urine volume.  Musculoskeletal: Negative for other MSK pain or swelling Skin: Negative for color change and worsening wound Neurological: Negative for tremors and numbness other than noted  Psychiatric/Behavioral: Negative for decreased concentration or agitation other than above       Objective:   Physical Exam BP 130/68 mmHg  Pulse 76  Temp(Src) 98.6 F (37 C) (Oral)  Resp 20  Wt 251 lb (113.853 kg)  SpO2 97% VS noted,  Constitutional: Pt appears in no apparent distress HENT: Head: NCAT.  Right Ear: External ear normal.  Left Ear: External ear normal.  Eyes: . Pupils are equal, round, and reactive to light. Conjunctivae and EOM are normal Neck: Normal range of motion. Neck supple.  Cardiovascular: Normal rate and regular rhythm.   Pulmonary/Chest: Effort normal and breath sounds without rales or wheezing.  + tender left paravertebral thoracic area muscular tender/spasm without sweling or rash Neurological: Pt is alert. Not confused , motor grossly intact Skin: Skin is warm. no LE edema, numerous slightly raised erythem areas to arms only below the elbows (and not hands or elsewhere), non in linear fashion, mild tender to touch but worse to one area or right arm Psychiatric: Pt behavior is normal. No agitation.     Assessment & Plan:

## 2016-05-23 NOTE — Assessment & Plan Note (Signed)
Suspect primarily contact dermatitis, cant r/o early cellulitis, Mild to mod, for antibx course,  depomedrol IM, short course predpac, and triam cream for itchy areas, to f/u any worsening symptoms or concerns

## 2016-05-23 NOTE — Assessment & Plan Note (Signed)
stable overall by history and exam, recent data reviewed with pt, and pt to continue medical treatment as before,  to f/u any worsening symptoms or concerns Lab Results  Component Value Date   WBC 9.9 05/03/2016   HGB 13.4 05/03/2016   HCT 39.2 05/03/2016   PLT 376.0 05/03/2016   GLUCOSE 84 05/03/2016   CHOL 152 05/03/2016   TRIG 147.0 05/03/2016   HDL 54.50 05/03/2016   LDLDIRECT 82.1 12/06/2009   LDLCALC 69 05/03/2016   ALT 12 05/03/2016   AST 12 05/03/2016   NA 138 05/03/2016   K 4.4 05/03/2016   CL 105 05/03/2016   CREATININE 0.83 05/03/2016   BUN 11 05/03/2016   CO2 26 05/03/2016   TSH 1.20 05/03/2016   HGBA1C 5.6 05/03/2016   Pt to call for worsening polys or cbg > 200 with steroid tx

## 2016-05-23 NOTE — Assessment & Plan Note (Signed)
C/w msk strain, no neuro symtpoms or changes,  for muscle relaxer prn,  to f/u any worsening symptoms or concerns

## 2016-05-27 ENCOUNTER — Encounter: Payer: Self-pay | Admitting: Internal Medicine

## 2016-05-27 MED ORDER — AZITHROMYCIN 250 MG PO TABS: ORAL_TABLET | ORAL | Status: DC

## 2016-05-27 MED FILL — GABAPENTIN 600 MG TABLET: 600 | 30 days supply | Qty: 90 | Fill #2

## 2016-05-27 MED FILL — AZITHROMYCIN 250 MG TABLET: 250 | 5 days supply | Qty: 6 | Fill #0

## 2016-05-29 ENCOUNTER — Ambulatory Visit: Payer: 59 | Admitting: Family Medicine

## 2016-06-03 ENCOUNTER — Encounter: Payer: Self-pay | Admitting: Family Medicine

## 2016-06-03 ENCOUNTER — Ambulatory Visit (INDEPENDENT_AMBULATORY_CARE_PROVIDER_SITE_OTHER): Payer: 59 | Admitting: Family Medicine

## 2016-06-03 VITALS — BP 122/76 | HR 82 | Wt 248.0 lb

## 2016-06-03 DIAGNOSIS — M9902 Segmental and somatic dysfunction of thoracic region: Secondary | ICD-10-CM | POA: Diagnosis not present

## 2016-06-03 DIAGNOSIS — M9908 Segmental and somatic dysfunction of rib cage: Secondary | ICD-10-CM

## 2016-06-03 DIAGNOSIS — M940 Chondrocostal junction syndrome [Tietze]: Secondary | ICD-10-CM

## 2016-06-03 DIAGNOSIS — M999 Biomechanical lesion, unspecified: Secondary | ICD-10-CM | POA: Insufficient documentation

## 2016-06-03 DIAGNOSIS — M9901 Segmental and somatic dysfunction of cervical region: Secondary | ICD-10-CM | POA: Diagnosis not present

## 2016-06-03 MED ORDER — TIZANIDINE HCL 4 MG PO TABS: 4.0000 mg | ORAL_TABLET | Freq: Every evening | ORAL | Status: DC

## 2016-06-03 MED FILL — tiZANidine HCL 4 MG TABS: 4 | 30 days supply | Qty: 30 | Fill #0

## 2016-06-03 NOTE — Assessment & Plan Note (Signed)
Decision today to treat with OMT was based on Physical Exam  After verbal consent patient was treated with HVLE, ME techniques in cervical, thoracic and rib areas  Patient tolerated the procedure well with improvement in symptoms  Patient given exercises, stretches and lifestyle modifications  See medications in patient instructions if given  Patient will follow up in 3-4 weeks

## 2016-06-03 NOTE — Patient Instructions (Signed)
Good to see you  Ice 20 minutes 2 times daily. Usually after activity and before bed. On wall with heels, butt shoulder and head touching for a goal of 5 minutes daily  Keep monitor at eye level.  Tennis ball between shoulder blades with sitting Exercises 3 times a week.  Lift only straight on Duexis 3 times a day for 3 days no matter what unless it hurts your stomach.  zanaflex at night for 3 nights then as needed.  Pennsaid if you need it.  Possibly looking into bra fitting.  See me again in 3 weeks.

## 2016-06-03 NOTE — Progress Notes (Signed)
[name] [place] [place], [place] [address] Phone: [phone] Subjective:    I'm seeing this patient by the request  of:  Cathlean Cower, MD   CC: Left sided thoracic pain  [id number] April [name] is a 40 y.o. female coming in with complaint of left sided thoracic pain. Patient has had this for greater than 2 weeks. Does not remember any true injury. Does not rib number lifting anything considerable weight. Patient states though that anytime she moves the arm, takes deep breaths she can feel some discomfort. No pain at rest. Patient seems to sleep comfortably on the she moves a different way. Denies any radiation of pain. Has recently had a rash on her arm but not on her trunk. Denies any fevers, chills, any abnormal weight loss or any bowel or bladder incontinence. States that she was recently given prednisone but unfortunately did not seem to help significantly.    Past Medical History  Diagnosis Date  . ALLERGIC RHINITIS   . ANXIETY DEPRESSION   . CONSTIPATION   . DEPRESSION   . Esophageal reflux   . HYPERCHOLESTEROLEMIA   . OBESITY   . Palpitations   . Occipital neuralgia    Past Surgical History  Procedure Laterality Date  . No past surgeries     Social History   Social History  . Marital Status: Married    Spouse Name: N/A  . Number of Children: N/A  . Years of Education: N/A   Social History Main Topics  . Smoking status: Never Smoker   . Smokeless tobacco: None     Comment: Married, lives with spouse  . Alcohol Use: No  . Drug Use: No  . Sexual Activity: Yes    Birth Control/ Protection: Pill   Other Topics Concern  . None   Social History Narrative   No Known Allergies Family History  Problem Relation Age of Onset  . Hypertension Mother   . Diabetes Mother   . Hyperlipidemia Mother   . Breast cancer Mother 37  . Osteoarthritis Mother   . Hyperlipidemia Father   . Hyperlipidemia Brother   . Coronary  artery disease Father     MI age 69    Past medical history, social, surgical and family history all reviewed in electronic medical record.  No pertanent information unless stated regarding to the chief complaint.   Review of Systems: No headache, visual changes, nausea, vomiting, diarrhea, constipation, dizziness, abdominal pain, skin rash, fevers, chills, night sweats, weight loss, swollen lymph nodes, body aches, joint swelling, muscle aches, chest pain, shortness of breath, mood changes.   Objective Blood pressure 122/76, pulse 82, weight 248 lb (112.492 kg).  General: No apparent distress alert and oriented x3 mood and affect normal, dressed appropriately.  HEENT: Pupils equal, extraocular movements intact  Respiratory: Patient's speak in full sentences and does not appear short of breath  Cardiovascular: No lower extremity edema, non tender, no erythema  Skin: Warm dry intact with no signs of infection or rash on extremities or on axial skeleton.  Abdomen: Soft nontender  Neuro: Cranial nerves II through XII are intact, neurovascularly intact in all extremities with 2+ DTRs and 2+ pulses.  Lymph: No lymphadenopathy of posterior or anterior cervical chain or axillae bilaterally.  Gait normal with good balance and coordination.  MSK:  Non tender with full range of motion and good stability and symmetric strength and tone of shoulders, elbows, wrist, hip, knee and ankles bilaterally.  Back Exam:  Inspection: Unremarkable  Motion: Flexion 45 deg, Extension 45 deg, Side Bending to 45 deg bilaterally,  Rotation to 45 deg bilaterally and does have pain more on the left side SLR laying: Negative  XSLR laying: Negative  Palpable tenderness: Tender over the sixth rib on the left side. No crepitus noted. Pain in the paraspinal musculature from T5-T8 FABER: negative. Sensory change: Gross sensation intact to all lumbar and sacral dermatomes.  Reflexes: 2+ at both patellar tendons, 2+ at  achilles tendons, Babinski's downgoing.  Strength at foot and legs Full and symmetric strength bilaterally  Osteopathic findings C2 flexed rotated and side bent right C4 flexed rotated and side bent left  T3 extended rotated and side bent right T6 extended rotated and side bent left with inhaled third rib    Impression and Recommendations:     This case required medical decision making of moderate complexity.      Note: This dictation was prepared with Dragon dictation along with smaller phrase technology. Any transcriptional errors that result from this process are unintentional.

## 2016-06-03 NOTE — Assessment & Plan Note (Signed)
Patient is somewhat of a slipped rib syndrome. We discussed icing, home exercises, we discussed which activities to do in which ones to potentially avoid. Patient responded fairly well to osteopathic manipulation. We discussed posture and ergonomics are the day they can be beneficial. We discussed over-the-counter medications. Patient even a short course of anti-inflammatories as well as a prescription for muscle relaxer at night. Follow-up with me again in 3-4 weeks. Than likely will be able to decrease frequency.

## 2016-06-05 ENCOUNTER — Encounter: Payer: Self-pay | Admitting: Internal Medicine

## 2016-06-05 DIAGNOSIS — F509 Eating disorder, unspecified: Secondary | ICD-10-CM

## 2016-06-18 MED FILL — NUVARING VAGINAL RING: 0.12-0.015 | 84 days supply | Qty: 3 | Fill #0

## 2016-06-18 NOTE — Progress Notes (Signed)
[name] [address] [place], [place] [address] Phone: [phone] Subjective:    I'm seeing this patient by the request  of:  Cathlean Cower, MD   CC: Left sided thoracic pain f/u  [id number]  April [name] is a 40 y.o. female coming in with complaint of left sided thoracic pain. 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 There was also a possibility of gastroesophageal reflux disease. Patient elected to try conservative therapy including osteopathic manipulation. Patient was to do home exercises working on posture and core strength. Patient given muscle relaxer at night. Patient states She is doing remarkably well. Mild discomfort here and there. Patient has not done the exercises quite regularly. Patient is a that she is very happy with the results so far.    Past Medical History:  Diagnosis Date  . ALLERGIC RHINITIS   . ANXIETY DEPRESSION   . CONSTIPATION   . DEPRESSION   . Esophageal reflux   . HYPERCHOLESTEROLEMIA   . OBESITY   . Occipital neuralgia   . Palpitations    Past Surgical History:  Procedure Laterality Date  . NO PAST SURGERIES     Social History   Social History  . Marital status: Married    Spouse name: N/A  . Number of children: N/A  . Years of education: N/A   Social History Main Topics  . Smoking status: Never Smoker  . Smokeless tobacco: None     Comment: Married, lives with spouse  . Alcohol use No  . Drug use: No  . Sexual activity: Yes    Birth control/ protection: Pill   Other Topics Concern  . None   Social History Narrative  . None   No Known Allergies Family History  Problem Relation Age of Onset  . Hypertension Mother   . Diabetes Mother   . Hyperlipidemia Mother   . Breast cancer Mother 86  . Osteoarthritis Mother   . Hyperlipidemia Father   . Coronary artery disease Father     MI age 30  . Hyperlipidemia Brother     Past medical history, social,  surgical and family history all reviewed in electronic medical record.  No pertanent information unless stated regarding to the chief complaint.   Review of Systems: No headache, visual changes, nausea, vomiting, diarrhea, constipation, dizziness, abdominal pain, skin rash, fevers, chills, night sweats, weight loss, swollen lymph nodes, body aches, joint swelling, muscle aches, chest pain, shortness of breath, mood changes.   Objective  Blood pressure 130/80, pulse 87, weight 251 lb (113.9 kg), SpO2 97 %.  General: No apparent distress alert and oriented x3 mood and affect normal, dressed appropriately.  HEENT: Pupils equal, extraocular movements intact  Respiratory: Patient's speak in full sentences and does not appear short of breath  Cardiovascular: No lower extremity edema, non tender, no erythema  Skin: Warm dry intact with no signs of infection or rash on extremities or on axial skeleton.  Abdomen: Soft nontender  Neuro: Cranial nerves II through XII are intact, neurovascularly intact in all extremities with 2+ DTRs and 2+ pulses.  Lymph: No lymphadenopathy of posterior or anterior cervical chain or axillae bilaterally.  Gait normal with good balance and coordination.  MSK:  Non tender with full range of motion and good stability and symmetric strength and tone of shoulders, elbows, wrist, hip, knee and ankles bilaterally.  Back Exam:  Inspection: Unremarkable  Motion: Flexion 45  deg, Extension 25 deg, Side Bending to 45 deg bilaterally,  Rotation to 45 deg bilaterally and does have pain more on the left side SLR laying: Negative  XSLR laying: Negative  Palpable tenderness: I'll tenderness still in the left paraspinal musculature of the thoracic spine FABER: negative. Sensory change: Gross sensation intact to all lumbar and sacral dermatomes.  Reflexes: 2+ at both patellar tendons, 2+ at achilles tendons, Babinski's downgoing.  Strength at foot and legs Full and symmetric strength  bilaterally  Osteopathic findings C2 flexed rotated and side bent right C6 flexed rotated and side bent left  T3 extended rotated and side bent right T6 extended rotated and side bent left with inhaled third rib    Impression and Recommendations:     This case required medical decision making of moderate complexity.      Note: This dictation was prepared with Dragon dictation along with smaller phrase technology. Any transcriptional errors that result from this process are unintentional.

## 2016-06-18 NOTE — Assessment & Plan Note (Addendum)
Patient overall seems to be doing well. We did discuss the possibility of formal physical therapy. Patient is doing well at the moment though that we'll not make any significant changes. 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 Patient will come back and see me again in 6-8 weeks.

## 2016-06-18 NOTE — Assessment & Plan Note (Addendum)
Decision today to treat with OMT was based on Physical Exam  After verbal consent patient was treated with HVLE, ME techniques in cervical, thoracic and rib areas  Patient tolerated the procedure well with improvement in symptoms  Patient given exercises, stretches and lifestyle modifications  See medications in patient instructions if given  Patient will follow up in 6-8 weeks

## 2016-06-21 ENCOUNTER — Ambulatory Visit (INDEPENDENT_AMBULATORY_CARE_PROVIDER_SITE_OTHER): Payer: 59 | Admitting: Family Medicine

## 2016-06-21 ENCOUNTER — Encounter: Payer: Self-pay | Admitting: Family Medicine

## 2016-06-21 VITALS — BP 130/80 | HR 87 | Wt 251.0 lb

## 2016-06-21 DIAGNOSIS — M940 Chondrocostal junction syndrome [Tietze]: Secondary | ICD-10-CM

## 2016-06-21 DIAGNOSIS — M9901 Segmental and somatic dysfunction of cervical region: Secondary | ICD-10-CM

## 2016-06-21 DIAGNOSIS — M9902 Segmental and somatic dysfunction of thoracic region: Secondary | ICD-10-CM | POA: Diagnosis not present

## 2016-06-21 DIAGNOSIS — M999 Biomechanical lesion, unspecified: Secondary | ICD-10-CM

## 2016-06-21 DIAGNOSIS — M9908 Segmental and somatic dysfunction of rib cage: Secondary | ICD-10-CM

## 2016-06-21 NOTE — Patient Instructions (Addendum)
Great to see you  Try nexium 2 times daily for 2 weeks if symptoms worsen Keep doing exercises 2-3 times a week Posture is key See me again in 6-8 weeks!

## 2016-06-24 MED FILL — GABAPENTIN 600 MG TABLET: 600 | 30 days supply | Qty: 90 | Fill #3

## 2016-06-28 ENCOUNTER — Ambulatory Visit (HOSPITAL_COMMUNITY): Payer: Self-pay | Admitting: Psychiatry

## 2016-07-10 MED FILL — FENOFIBRATE 67 MG CAPSULE: 67 | 90 days supply | Qty: 90 | Fill #0

## 2016-07-10 MED FILL — SIMVASTATIN 40 MG TABLET: 40 | 90 days supply | Qty: 90 | Fill #0

## 2016-07-23 ENCOUNTER — Other Ambulatory Visit (HOSPITAL_COMMUNITY): Payer: Self-pay | Admitting: Psychiatry

## 2016-07-23 ENCOUNTER — Encounter: Payer: Self-pay | Admitting: Internal Medicine

## 2016-07-23 DIAGNOSIS — F331 Major depressive disorder, recurrent, moderate: Secondary | ICD-10-CM

## 2016-07-23 MED ORDER — ESCITALOPRAM OXALATE 10 MG PO TABS: ORAL_TABLET | ORAL | 3 refills | Status: DC

## 2016-07-23 MED FILL — GABAPENTIN 600 MG TABLET: 600 | 30 days supply | Qty: 90 | Fill #4

## 2016-08-05 ENCOUNTER — Encounter: Payer: Self-pay | Admitting: Family Medicine

## 2016-08-05 ENCOUNTER — Ambulatory Visit: Payer: Self-pay | Admitting: Family Medicine

## 2016-08-05 ENCOUNTER — Ambulatory Visit (INDEPENDENT_AMBULATORY_CARE_PROVIDER_SITE_OTHER): Payer: 59 | Admitting: Family Medicine

## 2016-08-05 VITALS — BP 128/76 | HR 75

## 2016-08-05 DIAGNOSIS — M9901 Segmental and somatic dysfunction of cervical region: Secondary | ICD-10-CM | POA: Diagnosis not present

## 2016-08-05 DIAGNOSIS — M940 Chondrocostal junction syndrome [Tietze]: Secondary | ICD-10-CM | POA: Diagnosis not present

## 2016-08-05 DIAGNOSIS — M9902 Segmental and somatic dysfunction of thoracic region: Secondary | ICD-10-CM

## 2016-08-05 DIAGNOSIS — M9908 Segmental and somatic dysfunction of rib cage: Secondary | ICD-10-CM

## 2016-08-05 DIAGNOSIS — M999 Biomechanical lesion, unspecified: Secondary | ICD-10-CM

## 2016-08-05 NOTE — Assessment & Plan Note (Signed)
Decision today to treat with OMT was based on Physical Exam  After verbal consent patient was treated with HVLE, ME techniques in cervical, thoracic and rib areas  Patient tolerated the procedure well with improvement in symptoms  Patient given exercises, stretches and lifestyle modifications  See medications in patient instructions if given  Patient will follow up in 5-6 weeks

## 2016-08-05 NOTE — Assessment & Plan Note (Signed)
Responded very well to the osteopathic manipulation. Seem to be more of an acute problem and likely will do well. Discussed with patient as long as she is doing well we will space out to 5 and 6 weeks. Worsening symptoms she'll call me seen sooner. We discussed all other conservative therapy and patient will try to work on ergonomics and posture more.

## 2016-08-05 NOTE — Progress Notes (Signed)
[name] [place], [place] [address] Phone: [phone] Subjective:    I'm seeing this patient by the request  of:  Cathlean Cower, MD   CC: Left sided thoracic pain f/u  [id number]  April [name] is a 40 y.o. female coming in with complaint of left sided thoracic pain. 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 Patient states that she had been doing very well until recently. Patient states that today while she was working she started having increasing pain in the upper back. States that she started having spasming. Very similar to his initial presentation of the injury. Patient denies any numbness. Patient rates the severity of pain is 8 out of 10. Wants to make sure it does not get as bad as it did previously.    Past Medical History:  Diagnosis Date  . ALLERGIC RHINITIS   . ANXIETY DEPRESSION   . CONSTIPATION   . DEPRESSION   . Esophageal reflux   . HYPERCHOLESTEROLEMIA   . OBESITY   . Occipital neuralgia   . Palpitations    Past Surgical History:  Procedure Laterality Date  . NO PAST SURGERIES     Social History   Social History  . Marital status: Married    Spouse name: N/A  . Number of children: N/A  . Years of education: N/A   Social History Main Topics  . Smoking status: Never Smoker  . Smokeless tobacco: None     Comment: Married, lives with spouse  . Alcohol use No  . Drug use: No  . Sexual activity: Yes    Birth control/ protection: Pill   Other Topics Concern  . None   Social History Narrative  . None   No Known Allergies Family History  Problem Relation Age of Onset  . Hypertension Mother   . Diabetes Mother   . Hyperlipidemia Mother   . Breast cancer Mother 35  . Osteoarthritis Mother   . Hyperlipidemia Father   . Coronary artery disease Father     MI age 34  . Hyperlipidemia Brother     Past medical history, social, surgical and family history all  reviewed in electronic medical record.  No pertanent information unless stated regarding to the chief complaint.   Review of Systems: No headache, visual changes, nausea, vomiting, diarrhea, constipation, dizziness, abdominal pain, skin rash, fevers, chills, night sweats, weight loss, swollen lymph nodes, body aches, joint swelling, muscle aches, chest pain, shortness of breath, mood changes.   Objective  Blood pressure 128/76, pulse 75, SpO2 98 %.  General: No apparent distress alert and oriented x3 mood and affect normal, dressed appropriately.  HEENT: Pupils equal, extraocular movements intact  Respiratory: Patient's speak in full sentences and does not appear short of breath  Cardiovascular: No lower extremity edema, non tender, no erythema  Skin: Warm dry intact with no signs of infection or rash on extremities or on axial skeleton.  Abdomen: Soft nontender  Neuro: Cranial nerves II through XII are intact, neurovascularly intact in all extremities with 2+ DTRs and 2+ pulses.  Lymph: No lymphadenopathy of posterior or anterior cervical chain or axillae bilaterally.  Gait normal with good balance and coordination.  MSK:  Non tender with full range of motion and good stability and symmetric strength and tone of shoulders, elbows, wrist, hip, knee and ankles bilaterally.  Back Exam:  Inspection: Unremarkable  Motion: Flexion 45 deg, Extension  25 deg, Side Bending to 45 deg bilaterally,  Rotation to 45 deg bilaterally and does have pain more on the left side SLR laying: Negative  XSLR laying: Negative  Palpable tenderness: more tenderness at the thoracolumbar juncture. FABER: negative. Sensory change: Gross sensation intact to all lumbar and sacral dermatomes.  Reflexes: 2+ at both patellar tendons, 2+ at achilles tendons, Babinski's downgoing.  Strength at foot and legs Full and symmetric strength bilaterally  Osteopathic findings C2 flexed rotated and side bent right C4 flexed  rotated and side bent left  T3 extended rotated and side bent right T6 extended rotated and side bent left with inhaled third rib  L2 flexed rotated and side bent right   Impression and Recommendations:     This case required medical decision making of moderate complexity.      Note: This dictation was prepared with Dragon dictation along with smaller phrase technology. Any transcriptional errors that result from this process are unintentional.

## 2016-08-05 NOTE — Patient Instructions (Signed)
Eat to see you  You are doing great overall.  Posture and upper back strength is key  See me again in 5 weeks

## 2016-08-08 MED FILL — ESCITALOPRAM 10 MG TABLET: 10 | 90 days supply | Qty: 135 | Fill #0

## 2016-08-12 ENCOUNTER — Ambulatory Visit: Payer: Self-pay | Admitting: Family Medicine

## 2016-08-14 MED FILL — MONTELUKAST SOD 10 MG TAB: 10 | 90 days supply | Qty: 90 | Fill #1

## 2016-08-15 DIAGNOSIS — F509 Eating disorder, unspecified: Secondary | ICD-10-CM | POA: Diagnosis not present

## 2016-08-21 MED FILL — GABAPENTIN 600 MG TABLET: 600 | 30 days supply | Qty: 90 | Fill #5

## 2016-09-05 MED FILL — NUVARING VAGINAL RING: 0.12-0.015 | 84 days supply | Qty: 3 | Fill #1

## 2016-09-09 ENCOUNTER — Ambulatory Visit: Payer: 59 | Admitting: Family Medicine

## 2016-09-09 DIAGNOSIS — R51 Headache: Secondary | ICD-10-CM | POA: Diagnosis not present

## 2016-09-09 DIAGNOSIS — G43719 Chronic migraine without aura, intractable, without status migrainosus: Secondary | ICD-10-CM | POA: Diagnosis not present

## 2016-09-09 MED FILL — FLUTICASONE PROP 50 MCG SPR: 50 | 30 days supply | Qty: 16 | Fill #0

## 2016-09-10 NOTE — Assessment & Plan Note (Signed)
Decision today to treat with OMT was based on Physical Exam  After verbal consent patient was treated with HVLE, ME techniques in cervical, thoracic and rib areas  Patient tolerated the procedure well with improvement in symptoms  Patient given exercises, stretches and lifestyle modifications  See medications in patient instructions if given  Patient will follow up in 4-8 weeks

## 2016-09-10 NOTE — Assessment & Plan Note (Addendum)
Continues to have some difficult he. We discussed icing regimen and home exercises. We discussed which activities doing which ones to avoid. Patient will continue to stay active. Follow-up with me again in 4-8 weeks.

## 2016-09-10 NOTE — Progress Notes (Signed)
[name] [address], [place] [address] Phone: [phone] Subjective:    I'm seeing this patient by the request  of:  Cathlean Cower, MD   CC: Left sided thoracic pain f/u  [id number]  April [name] is a 40 y.o. female coming in with complaint of left sided thoracic pain. 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 Patient was last seen 1 month ago. Patient states Overall she is doing very well. Not having as much pain. Has been doing the exercises fairly regularly. No radiation down the arms. Feels that her rib stain in place.    Past Medical History:  Diagnosis Date  . ALLERGIC RHINITIS   . ANXIETY DEPRESSION   . CONSTIPATION   . DEPRESSION   . Esophageal reflux   . HYPERCHOLESTEROLEMIA   . OBESITY   . Occipital neuralgia   . Palpitations    Past Surgical History:  Procedure Laterality Date  . NO PAST SURGERIES     Social History   Social History  . Marital status: Married    Spouse name: N/A  . Number of children: N/A  . Years of education: N/A   Social History Main Topics  . Smoking status: Never Smoker  . Smokeless tobacco: None     Comment: Married, lives with spouse  . Alcohol use No  . Drug use: No  . Sexual activity: Yes    Birth control/ protection: Pill   Other Topics Concern  . None   Social History Narrative  . None   No Known Allergies Family History  Problem Relation Age of Onset  . Hypertension Mother   . Diabetes Mother   . Hyperlipidemia Mother   . Breast cancer Mother 93  . Osteoarthritis Mother   . Hyperlipidemia Father   . Coronary artery disease Father     MI age 21  . Hyperlipidemia Brother     Past medical history, social, surgical and family history all reviewed in electronic medical record.  No pertanent information unless stated regarding to the chief complaint.   Review of Systems: No headache, visual changes, nausea, vomiting, diarrhea,  constipation, dizziness, abdominal pain, skin rash, fevers, chills, night sweats, weight loss, swollen lymph nodes, body aches, joint swelling, muscle aches, chest pain, shortness of breath, mood changes.   Objective  Blood pressure 120/76, pulse 74, weight 252 lb (114.3 kg).  General: No apparent distress alert and oriented x3 mood and affect normal, dressed appropriately.  HEENT: Pupils equal, extraocular movements intact  Respiratory: Patient's speak in full sentences and does not appear short of breath  Cardiovascular: No lower extremity edema, non tender, no erythema  Skin: Warm dry intact with no signs of infection or rash on extremities or on axial skeleton.  Abdomen: Soft nontender  Neuro: Cranial nerves II through XII are intact, neurovascularly intact in all extremities with 2+ DTRs and 2+ pulses.  Lymph: No lymphadenopathy of posterior or anterior cervical chain or axillae bilaterally.  Gait normal with good balance and coordination.  MSK:  Non tender with full range of motion and good stability and symmetric strength and tone of shoulders, elbows, wrist, hip, knee and ankles bilaterally.  Back Exam:  Inspection: Unremarkable  Motion: Flexion 45 deg, Extension 25 deg, Side Bending to 45 deg bilaterally,  Rotation to 45 deg bilaterally  SLR laying: Negative  XSLR laying: Negative  Palpable tenderness: Less tenderness over the thoracal lumbar  junction FABER: negative. Sensory change: Gross sensation intact to all lumbar and sacral dermatomes.  Reflexes: 2+ at both patellar tendons, 2+ at achilles tendons, Babinski's downgoing.  Strength at foot and legs Full and symmetric strength bilaterally  Osteopathic findings C2 flexed rotated and side bent right C6 flexed rotated and side bent left  T3 extended rotated and side bent right T7 extended rotated and side bent left with inhaled third rib  L2 flexed rotated and side bent right   Impression and Recommendations:     This  case required medical decision making of moderate complexity.      Note: This dictation was prepared with Dragon dictation along with smaller phrase technology. Any transcriptional errors that result from this process are unintentional.

## 2016-09-11 ENCOUNTER — Ambulatory Visit (INDEPENDENT_AMBULATORY_CARE_PROVIDER_SITE_OTHER): Payer: 59 | Admitting: Family Medicine

## 2016-09-11 ENCOUNTER — Encounter: Payer: Self-pay | Admitting: Family Medicine

## 2016-09-11 VITALS — BP 120/76 | HR 74 | Wt 252.0 lb

## 2016-09-11 DIAGNOSIS — M999 Biomechanical lesion, unspecified: Secondary | ICD-10-CM

## 2016-09-11 DIAGNOSIS — M940 Chondrocostal junction syndrome [Tietze]: Secondary | ICD-10-CM

## 2016-09-11 NOTE — Patient Instructions (Signed)
Good to see you  I am proud of you Keep it up  See me again in 2 months!

## 2016-09-17 MED FILL — GABAPENTIN 600 MG TABLET: 600 | 30 days supply | Qty: 90 | Fill #0

## 2016-10-02 MED FILL — FLUCONAZOLE 150 MG TABLET: 150 | 3 days supply | Qty: 3 | Fill #1

## 2016-10-03 DIAGNOSIS — N6459 Other signs and symptoms in breast: Secondary | ICD-10-CM | POA: Diagnosis not present

## 2016-10-08 MED FILL — FENOFIBRATE 67 MG CAPSULE: 67 | 90 days supply | Qty: 90 | Fill #1

## 2016-10-08 MED FILL — SIMVASTATIN 40 MG TABLET: 40 | 90 days supply | Qty: 90 | Fill #1

## 2016-10-22 ENCOUNTER — Encounter: Payer: Self-pay | Admitting: Internal Medicine

## 2016-10-22 DIAGNOSIS — N6002 Solitary cyst of left breast: Secondary | ICD-10-CM | POA: Diagnosis not present

## 2016-10-22 DIAGNOSIS — R922 Inconclusive mammogram: Secondary | ICD-10-CM | POA: Diagnosis not present

## 2016-10-28 MED FILL — GABAPENTIN 600 MG TABLET: 600 | 30 days supply | Qty: 90 | Fill #1

## 2016-11-09 NOTE — Progress Notes (Deleted)
[name] [address], [place] [address] Phone: [phone] Subjective:    I'm seeing this patient by the request  of:  Cathlean Cower, MD   CC: Left sided thoracic pain f/u  [id number]  April [name] is a 40 y.o. female coming in with complaint of left sided thoracic pain. 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 Has responded fairly well to conservative therapy including osteopathic manipulation. Patient was to continue to do home exercises. Patient states    Past Medical History:  Diagnosis Date  . ALLERGIC RHINITIS   . ANXIETY DEPRESSION   . CONSTIPATION   . DEPRESSION   . Esophageal reflux   . HYPERCHOLESTEROLEMIA   . OBESITY   . Occipital neuralgia   . Palpitations    Past Surgical History:  Procedure Laterality Date  . NO PAST SURGERIES     Social History   Social History  . Marital status: Married    Spouse name: N/A  . Number of children: N/A  . Years of education: N/A   Social History Main Topics  . Smoking status: Never Smoker  . Smokeless tobacco: Not on file     Comment: Married, lives with spouse  . Alcohol use No  . Drug use: No  . Sexual activity: Yes    Birth control/ protection: Pill   Other Topics Concern  . Not on file   Social History Narrative  . No narrative on file   No Known Allergies Family History  Problem Relation Age of Onset  . Hypertension Mother   . Diabetes Mother   . Hyperlipidemia Mother   . Breast cancer Mother 85  . Osteoarthritis Mother   . Hyperlipidemia Father   . Coronary artery disease Father     MI age 39  . Hyperlipidemia Brother     Past medical history, social, surgical and family history all reviewed in electronic medical record.  No pertanent information unless stated regarding to the chief complaint.   Review of Systems: No headache, visual changes, nausea, vomiting, diarrhea, constipation, dizziness, abdominal pain, skin rash, fevers,  chills, night sweats, weight loss, swollen lymph nodes, body aches, joint swelling, muscle aches, chest pain, shortness of breath, mood changes.    Objective  There were no vitals taken for this visit.  Systems examined below as of 11/09/16 General: NAD A&O x3 mood, affect normal  HEENT: Pupils equal, extraocular movements intact no nystagmus Respiratory: not short of breath at rest or with speaking Cardiovascular: No lower extremity edema, non tender Skin: Warm dry intact with no signs of infection or rash on extremities or on axial skeleton. Abdomen: Soft nontender, no masses Neuro: Cranial nerves  intact, neurovascularly intact in all extremities with 2+ DTRs and 2+ pulses. Lymph: No lymphadenopathy appreciated today  Gait normal with good balance and coordination.  MSK: Non tender with full range of motion and good stability and symmetric strength and tone of shoulders, elbows, wrist,  knee hips and ankles bilaterally.   Back Exam:  Inspection: Unremarkable  Motion: Flexion 45 deg, Extension 25 deg, Side Bending to 45 deg bilaterally,  Rotation to 45 deg bilaterally  SLR laying: Negative  XSLR laying: Negative  Palpable tenderness: Less tenderness over the thoracal lumbar junction FABER: negative. Sensory change: Gross sensation intact to all lumbar and sacral dermatomes.  Reflexes: 2+ at both patellar tendons, 2+ at achilles tendons, Babinski's downgoing.  Strength at foot and legs Full and symmetric strength  bilaterally  Osteopathic findings Cervical C2 flexed rotated and side bent right C4 flexed rotated and side bent left C6 flexed rotated and side bent left T3 extended rotated and side bent right inhaled third rib T9 extended rotated and side bent left L2 flexed rotated and side bent right Sacrum right on right    Impression and Recommendations:     This case required medical decision making of moderate complexity.      Note: This dictation was prepared with  Dragon dictation along with smaller phrase technology. Any transcriptional errors that result from this process are unintentional.

## 2016-11-12 ENCOUNTER — Encounter: Payer: Self-pay | Admitting: Family Medicine

## 2016-11-12 ENCOUNTER — Ambulatory Visit: Payer: 59 | Admitting: Family Medicine

## 2016-11-13 MED FILL — ESCITALOPRAM 10 MG TABLET: 10 | 90 days supply | Qty: 135 | Fill #1

## 2016-11-13 MED FILL — MONTELUKAST SOD 10 MG TAB: 10 | 90 days supply | Qty: 90 | Fill #2

## 2016-11-21 MED FILL — NUVARING VAGINAL RING: 0.12-0.015 | 84 days supply | Qty: 3 | Fill #2

## 2016-12-09 ENCOUNTER — Ambulatory Visit: Payer: Self-pay | Admitting: Family Medicine

## 2016-12-19 ENCOUNTER — Telehealth: Payer: Self-pay | Admitting: Internal Medicine

## 2016-12-19 MED FILL — DOXYCYCLINE HYCLATE 100 MG: 100 | 10 days supply | Qty: 20 | Fill #0

## 2016-12-20 ENCOUNTER — Emergency Department (HOSPITAL_COMMUNITY)
Admission: EM | Admit: 2016-12-20 | Discharge: 2016-12-20 | Disposition: A | Discharge status: Home / Self Care | Payer: 59 | Attending: Emergency Medicine | Admitting: Emergency Medicine

## 2016-12-20 ENCOUNTER — Encounter (HOSPITAL_COMMUNITY): Payer: Self-pay

## 2016-12-20 ENCOUNTER — Ambulatory Visit: Payer: Self-pay | Admitting: Internal Medicine

## 2016-12-20 ENCOUNTER — Ambulatory Visit (INDEPENDENT_AMBULATORY_CARE_PROVIDER_SITE_OTHER): Payer: 59 | Admitting: Family Medicine

## 2016-12-20 ENCOUNTER — Emergency Department (HOSPITAL_COMMUNITY): Payer: 59

## 2016-12-20 VITALS — BP 158/80 | HR 96 | Ht 70.0 in

## 2016-12-20 DIAGNOSIS — J0110 Acute frontal sinusitis, unspecified: Secondary | ICD-10-CM | POA: Insufficient documentation

## 2016-12-20 DIAGNOSIS — R69 Illness, unspecified: Secondary | ICD-10-CM

## 2016-12-20 DIAGNOSIS — R05 Cough: Secondary | ICD-10-CM | POA: Diagnosis not present

## 2016-12-20 DIAGNOSIS — R51 Headache: Secondary | ICD-10-CM | POA: Diagnosis not present

## 2016-12-20 DIAGNOSIS — R0602 Shortness of breath: Secondary | ICD-10-CM | POA: Insufficient documentation

## 2016-12-20 DIAGNOSIS — R0981 Nasal congestion: Secondary | ICD-10-CM | POA: Diagnosis not present

## 2016-12-20 LAB — URINALYSIS, ROUTINE W REFLEX MICROSCOPIC
Bilirubin Urine: NEGATIVE
Glucose, UA: NEGATIVE mg/dL
Hgb urine dipstick: NEGATIVE
Ketones, ur: NEGATIVE mg/dL
LEUKOCYTES UA: NEGATIVE
NITRITE: NEGATIVE
PH: 6 (ref 5.0–8.0)
PROTEIN: NEGATIVE mg/dL
Specific Gravity, Urine: 1.015 (ref 1.005–1.030)

## 2016-12-20 LAB — CBC
HEMATOCRIT: 39.9 % (ref 36.0–46.0)
Hemoglobin: 13.3 g/dL (ref 12.0–15.0)
MCH: 27.7 pg (ref 26.0–34.0)
MCHC: 33.3 g/dL (ref 30.0–36.0)
MCV: 83.1 fL (ref 78.0–100.0)
PLATELETS: 342 10*3/uL (ref 150–400)
RBC: 4.8 MIL/uL (ref 3.87–5.11)
RDW: 13.7 % (ref 11.5–15.5)
WBC: 14.8 10*3/uL — AB (ref 4.0–10.5)

## 2016-12-20 LAB — BASIC METABOLIC PANEL
Anion gap: 8 (ref 5–15)
BUN: 12 mg/dL (ref 6–20)
CHLORIDE: 107 mmol/L (ref 101–111)
CO2: 23 mmol/L (ref 22–32)
Calcium: 9.5 mg/dL (ref 8.9–10.3)
Creatinine, Ser: 0.85 mg/dL (ref 0.44–1.00)
Glucose, Bld: 112 mg/dL — ABNORMAL HIGH (ref 65–99)
POTASSIUM: 3.7 mmol/L (ref 3.5–5.1)
SODIUM: 138 mmol/L (ref 135–145)

## 2016-12-20 LAB — CBG MONITORING, ED: Glucose-Capillary: 87 mg/dL (ref 65–99)

## 2016-12-20 MED ORDER — SODIUM CHLORIDE 0.9 % IV SOLN
INTRAVENOUS | Status: DC
  Administered 2016-12-20: 11:00:00 via INTRAVENOUS

## 2016-12-20 MED ORDER — SODIUM CHLORIDE 0.9 % IV BOLUS (SEPSIS)
2000.0000 mL | Freq: Once | INTRAVENOUS | Status: AC
  Administered 2016-12-20: 2000 mL via INTRAVENOUS

## 2016-12-20 MED ORDER — LEVOFLOXACIN 750 MG PO TABS: 750.0000 mg | ORAL_TABLET | Freq: Every day | ORAL | 0 refills | Status: DC

## 2016-12-20 MED FILL — levoFLOXacin 750 MG TABS: 750 | 10 days supply | Qty: 10 | Fill #0

## 2016-12-20 NOTE — ED Provider Notes (Signed)
[facility] DEPT Provider Note   CSN: [id number] Arrival date & time: 12/20/16  [id number]     History   Chief Complaint Chief Complaint  Patient presents with  . Dizziness  . Near Syncope    HPI April [name] is a 41 y.o. female.  41 year old female presents with several-day history of left sided facial pain. History sinusitis and this feels similar. Is currently taking doxycycline 2 days per same. She is also had several days of cough and congestion. Denies any vomiting or diarrhea. Today became lightheaded and dizzy and felt like she was given a pass out and walked across her room and did pass out. Denies any chest pain or shortness of breath prior to this. No severe headaches with this. No injury from this fall. Denies any current abdominal discomfort. No black or bloody stools. No urinary symptoms.      Past Medical History:  Diagnosis Date  . ALLERGIC RHINITIS   . ANXIETY DEPRESSION   . CONSTIPATION   . DEPRESSION   . Esophageal reflux   . HYPERCHOLESTEROLEMIA   . OBESITY   . Occipital neuralgia   . Palpitations     Patient Active Problem List   Diagnosis Date Noted  . 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 Nonallopathic lesion of thoracic region 06/03/2016  . Nonallopathic lesion-rib cage 06/03/2016  . Nonallopathic lesion of cervical region 06/03/2016  . Rash 05/22/2016  . Thoracic back pain 05/22/2016  . Hyperglycemia 05/03/2016  . Encounter for well adult exam with abnormal findings 05/04/2015  . Left hamstring injury 01/24/2015  . Major depressive disorder, recurrent episode, moderate ([place]) 12/02/2014  . Occipital neuralgia   . ANEMIA-NOS 12/11/2009  . Hyperlipidemia 11/16/2008  . Obesity 11/16/2008  . ANXIETY DEPRESSION 11/16/2008  . ALLERGIC RHINITIS 11/16/2008  . Esophageal reflux 11/16/2008  . Constipation 11/16/2008  . Palpitations 11/16/2008    Past Surgical History:  Procedure Laterality Date  . NO PAST SURGERIES      OB History    No data available       Home Medications    Prior to Admission medications   Medication Sig Start Date End Date Taking? Authorizing Provider  albuterol (PROAIR HFA) 108 (90 Base) MCG/ACT inhaler Inhale 2 puffs into the lungs every 6 (six) hours as needed for wheezing or shortness of breath. 05/03/16  Yes Biagio Borg, MD  cetirizine (ZYRTEC) 10 MG tablet Take 1 tablet (10 mg total) by mouth daily. 05/03/16  Yes Biagio Borg, MD  doxycycline (VIBRA-TABS) 100 MG tablet Take 100 mg by mouth 2 (two) times daily. 10 day course started 2/1 12/19/16  Yes Historical Provider, MD  escitalopram (LEXAPRO) 10 MG tablet Take one and 1/2 tab by mouth per day Patient taking differently: 20 mg every evening.  07/23/16  Yes Biagio Borg, MD  esomeprazole (NEXIUM) 20 MG capsule Take 20 mg by mouth daily at 12 noon.   Yes Historical Provider, MD  etonogestrel-ethinyl estradiol (NUVARING) 0.12-0.015 MG/24HR vaginal ring Place 1 each vaginally every 28 (twenty-eight) days. Insert vaginally and leave in place for 3 consecutive weeks, then remove for 1 week.    Yes Historical Provider, MD  fenofibrate micronized (LOFIBRA) 67 MG capsule Take 1 capsule (67 mg total) by mouth daily before breakfast. 05/03/16  Yes Biagio Borg, MD  fluticasone [facility]) 50 MCG/ACT nasal spray Place 2 sprays into both nostrils 2 (two) times daily as needed. 05/03/16  Yes Biagio Borg, MD  gabapentin (NEURONTIN) 600 MG  tablet Take 900 mg by mouth every evening   Yes Historical Provider, MD  montelukast (SINGULAIR) 10 MG tablet Take 1 tablet (10 mg total) by mouth at bedtime. 05/03/16  Yes Biagio Borg, MD  Multiple Vitamin (MULTIVITAMIN) tablet Take 1 tablet by mouth daily.     Yes Historical Provider, MD  simvastatin (ZOCOR) 40 MG tablet Take 1 tablet (40 mg total) by mouth at bedtime. 05/03/16  Yes Biagio Borg, MD  triamcinolone cream (KENALOG) 0.1 % Apply 1 application topically 2 (two) times daily. 05/22/16  Yes Biagio Borg, MD  vitamin B-12  (CYANOCOBALAMIN) 1000 MCG tablet Take 1,000 mcg by mouth daily.   Yes Historical Provider, MD  vitamin C (ASCORBIC ACID) 500 MG tablet Take 500 mg by mouth daily.     Yes Historical Provider, MD  cyclobenzaprine (FLEXERIL) 5 MG tablet Take 1 tablet (5 mg total) by mouth 3 (three) times daily as needed for muscle spasms. Patient not taking: Reported on 12/20/2016 05/22/16   Biagio Borg, MD  docusate sodium (COLACE) 100 MG capsule Take 1 capsule (100 mg total) by mouth 2 (two) times daily. Patient not taking: Reported on 12/20/2016 05/03/16   Biagio Borg, MD  olopatadine (PATANOL) 0.1 % ophthalmic solution Place 1 drop into both eyes 2 (two) times daily. Patient not taking: Reported on 12/20/2016 05/03/16   Biagio Borg, MD  tiZANidine (ZANAFLEX) 4 MG tablet Take 1 tablet (4 mg total) by mouth Nightly. Patient not taking: Reported on 12/20/2016 06/03/16   Lyndal Pulley, DO  zolpidem (AMBIEN) 10 MG tablet Take 1 tablet (10 mg total) by mouth at bedtime as needed for sleep. 01/10/16 02/09/16  Norma Fredrickson, MD    Family History Family History  Problem Relation Age of Onset  . Hypertension Mother   . Diabetes Mother   . Hyperlipidemia Mother   . Breast cancer Mother 76  . Osteoarthritis Mother   . Hyperlipidemia Father   . Coronary artery disease Father     MI age 57  . Hyperlipidemia Brother     Social History Social History  Substance Use Topics  . Smoking status: Never Smoker  . Smokeless tobacco: Never Used     Comment: Married, lives with spouse  . Alcohol use No     Allergies   Patient has no known allergies.   Review of Systems Review of Systems  All other systems reviewed and are negative.    Physical Exam Updated Vital Signs BP 145/93 (BP Location: Right Arm)   Pulse 87   Temp 98.7 F (37.1 C) (Oral)   Resp 18   SpO2 96%   Physical Exam  Constitutional: She is oriented to person, place, and time. She appears well-developed and well-nourished.  Non-toxic appearance.  No distress.  HENT:  Head: Normocephalic and atraumatic.  Eyes: Conjunctivae, EOM and lids are normal. Pupils are equal, round, and reactive to light.  Neck: Normal range of motion. Neck supple. No tracheal deviation present. No thyroid mass present.  Cardiovascular: Normal rate, regular rhythm and normal heart sounds.  Exam reveals no gallop.   No murmur heard. Pulmonary/Chest: Effort normal and breath sounds normal. No stridor. No respiratory distress. She has no decreased breath sounds. She has no wheezes. She has no rhonchi. She has no rales.  Abdominal: Soft. Normal appearance and bowel sounds are normal. She exhibits no distension. There is no tenderness. There is no rebound and no CVA tenderness.  Musculoskeletal: Normal range of  motion. She exhibits no edema or tenderness.  Neurological: She is alert and oriented to person, place, and time. She has normal strength. No cranial nerve deficit or sensory deficit. GCS eye subscore is 4. GCS verbal subscore is 5. GCS motor subscore is 6.  Skin: Skin is warm and dry. No abrasion and no rash noted.  Psychiatric: She has a normal mood and affect. Her speech is normal and behavior is normal.  Nursing note and vitals reviewed.    ED Treatments / Results  Labs (all labs ordered are listed, but only abnormal results are displayed) Labs Reviewed  BASIC METABOLIC PANEL  CBC  URINALYSIS, ROUTINE W REFLEX MICROSCOPIC  CBG MONITORING, ED    EKG  EKG Interpretation  Date/Time:  Friday December 20 2016 09:34:43 EST Ventricular Rate:  98 PR Interval:    QRS Duration: 95 QT Interval:  341 QTC Calculation: 436 R Axis:   76 Text Interpretation:  Sinus rhythm Nonspecific T abnormalities, diffuse leads Confirmed by Jak Haggar  MD, Dolores Ewing ([id number]) on 12/20/2016 10:37:02 AM       Radiology No results found.  Procedures Procedures (including critical care time)  Medications Ordered in ED Medications - No data to display   Initial Impression /  Assessment and Plan / ED Course  I have reviewed the triage vital signs and the nursing notes.  Pertinent labs & imaging results that were available during my care of the patient were reviewed by me and considered in my medical decision making (see chart for details).   patient given IV fluids here. CT scan consistent with sinusitis. Will place on Levaquin and return precautions given.  Final Clinical Impressions(s) / ED Diagnoses   Final diagnoses:  None    New Prescriptions New Prescriptions   No medications on file     Lacretia Leigh, MD 12/20/16 1319

## 2016-12-20 NOTE — Progress Notes (Signed)
  Patient came in today for manipulation therapy secondary to patient's history of back pain. Patient though unfortunately was not feeling good. Patient seems significantly ill. Patient did have some mild tachycardia and also had a sweat. Patient's blood pressure  Blood pressure (!) 158/80, pulse 96, height 5\' 10"  (1.778 m).  Patient had been diagnosed recently with the sinus infection and had been on antibiotics for the last 3 days. Patient will be sent to the emergency room for further evaluation.

## 2016-12-20 NOTE — ED Triage Notes (Addendum)
Pt has severe sinus infection.  Pt has had diarrhea.  Nausea.  Dizziness. Has been on antibiotics. Went to MD this morning.  While in bathroom she felt weak and that she was going to pass out.  Knees buckled. Pt did not lose conscious.  Pt sat in floor.  Family brought to ED.  Pt has not taken a temp.  No body aches

## 2016-12-23 ENCOUNTER — Encounter: Payer: Self-pay | Admitting: Internal Medicine

## 2016-12-23 ENCOUNTER — Ambulatory Visit (INDEPENDENT_AMBULATORY_CARE_PROVIDER_SITE_OTHER): Payer: 59 | Admitting: Internal Medicine

## 2016-12-23 DIAGNOSIS — J3089 Other allergic rhinitis: Secondary | ICD-10-CM

## 2016-12-23 NOTE — Assessment & Plan Note (Signed)
She is currently being treated for sinus infection and can continue levaquin. Does not need further treatment at this time. She will continue netty rinses and the zyrtec. Albuterol as needed. Will use mucinex for cough. Advised she needs to increase water and food intake to help with vasovagal symptoms.

## 2016-12-23 NOTE — Progress Notes (Signed)
   Subjective:    [name], female    DOB: 08-Jul-1976, 41 y.o.   MRN: [id number]  HPI The patient is a 41 YO female coming in for sinus congestion. She has had this about 2 weeks and went to ER last week with syncope and sinus problems. Given head CT sinuses and rx for levaquin. She has been taking that for about the last 3-4 days and feels like the overall drainage is diminishing but she is still having sinus pressure. No fevers throughout any of this. She is not eating and drinking well and still gets some dizzy is she stands too quickly. She denies further syncope. No side effects from levaquin. Taking zyrtec at night time and netty rinses daily.   Review of Systems  Constitutional: Positive for activity change, appetite change and fatigue. Negative for chills, fever and unexpected weight change.  HENT: Positive for congestion, rhinorrhea, sinus pain and sinus pressure. Negative for ear discharge, ear pain, nosebleeds, postnasal drip, sore throat and trouble swallowing.   Eyes: Negative.   Respiratory: Positive for cough. Negative for chest tightness, shortness of breath and wheezing.   Cardiovascular: Negative.   Gastrointestinal: Negative.   Musculoskeletal: Negative.   Neurological: Positive for headaches.      Objective:   Physical Exam  Constitutional: She is oriented to person, place, and time. She appears well-developed and well-nourished.  HENT:  Head: Normocephalic and atraumatic.  Oropharynx with redness, frontal sinus pressure, nose without crusting.  Eyes: EOM are normal.  Neck: Normal range of motion. No JVD present.  Cardiovascular: Normal rate and regular rhythm.   Pulmonary/Chest: Effort normal and breath sounds normal. No respiratory distress. She has no wheezes. She has no rales.  Abdominal: Soft.  Lymphadenopathy:    She has no cervical adenopathy.  Neurological: She is alert and oriented to person, place, and time.  Skin: Skin is warm and  dry.   Vitals:   12/23/16 1426  BP: (!) 160/90  Pulse: 96  Temp: 97.9 F (36.6 C)  TempSrc: Oral  SpO2: 100%  Weight: 243 lb (110.2 kg)  Height: 5\' 10"  (1.778 m)      Assessment & Plan:

## 2016-12-23 NOTE — Patient Instructions (Signed)
We will have you keep taking the levaquin and try to drink more fluids.   If you are not getting better on Thursday call us back and we can call in some prednisone.

## 2016-12-23 NOTE — Progress Notes (Signed)
Pre visit review using our clinic review tool, if applicable. No additional management support is needed unless otherwise documented below in the visit note. 

## 2016-12-24 ENCOUNTER — Encounter: Payer: Self-pay | Admitting: Internal Medicine

## 2016-12-24 ENCOUNTER — Ambulatory Visit: Payer: Self-pay | Admitting: Internal Medicine

## 2016-12-24 MED ORDER — ONDANSETRON HCL 4 MG PO TABS: 4.0000 mg | ORAL_TABLET | Freq: Three times a day (TID) | ORAL | 0 refills | Status: DC | PRN

## 2016-12-24 MED FILL — ONDANSETRON HCL 4 MG TABLET: 4 | 7 days supply | Qty: 20 | Fill #0

## 2016-12-30 ENCOUNTER — Encounter: Payer: Self-pay | Admitting: Internal Medicine

## 2016-12-30 MED FILL — GABAPENTIN 600 MG TABLET: 600 | 30 days supply | Qty: 90 | Fill #2

## 2016-12-30 MED FILL — FLUCONAZOLE 150 MG TABLET: 150 | 3 days supply | Qty: 3 | Fill #2

## 2016-12-31 ENCOUNTER — Encounter: Payer: Self-pay | Admitting: Internal Medicine

## 2016-12-31 ENCOUNTER — Ambulatory Visit (INDEPENDENT_AMBULATORY_CARE_PROVIDER_SITE_OTHER): Payer: 59 | Admitting: Internal Medicine

## 2016-12-31 DIAGNOSIS — F341 Dysthymic disorder: Secondary | ICD-10-CM

## 2016-12-31 DIAGNOSIS — R112 Nausea with vomiting, unspecified: Secondary | ICD-10-CM | POA: Insufficient documentation

## 2016-12-31 DIAGNOSIS — R42 Dizziness and giddiness: Secondary | ICD-10-CM

## 2016-12-31 MED ORDER — BUSPIRONE HCL 10 MG PO TABS: 5.0000 mg | ORAL_TABLET | Freq: Three times a day (TID) | ORAL | 0 refills | Status: DC | PRN

## 2016-12-31 MED ORDER — ONDANSETRON HCL 4 MG PO TABS: 4.0000 mg | ORAL_TABLET | Freq: Three times a day (TID) | ORAL | 0 refills | Status: DC | PRN

## 2016-12-31 MED FILL — busPIRone HCL 10 MG TABS: 10 | 20 days supply | Qty: 60 | Fill #0

## 2016-12-31 MED FILL — ONDANSETRON HCL 4 MG TABLET: 4 | 30 days supply | Qty: 90 | Fill #0

## 2016-12-31 NOTE — Patient Instructions (Signed)
We have sent in buspar for the anxiety to take. You can take 1/2 to 1 pill up to 3 times per day. This should take the edge off the anxiety without causing you to be as spaced out.   We have sent in refills of the zofran.   The pregnancy test is negative.

## 2016-12-31 NOTE — Assessment & Plan Note (Signed)
Sounds to be more related to symptoms when standing quickly and may not need the meclizine she is taking. Reminded her that she should not take meclizine if not having spinning dizziness. This medication can cause dizziness if taken when not needed.

## 2016-12-31 NOTE — Assessment & Plan Note (Signed)
She is taking lexapro chronically and she has been using old klonopin which is not safe. Rx buspar for anxiety and she will try this. Did talk with her about the fact that the black box warning in question does not have a lot of trials about how long these side effects can last and if they resolve.

## 2016-12-31 NOTE — Progress Notes (Signed)
Pre visit review using our clinic review tool, if applicable. No additional management support is needed unless otherwise documented below in the visit note. 

## 2016-12-31 NOTE — Assessment & Plan Note (Signed)
Could be related to levaquin usage. Checked POC pregnancy test today in office which is negative. Rx for zofran for the nausea and if related to levaquin should be resolving very soon. I suspect some of the nausea is coming from the anxiety.

## 2016-12-31 NOTE — Progress Notes (Signed)
   Subjective:    [name], female    DOB: [birth year], 41 y.o.   MRN: [id number]  HPI The patient is a 41 YO female coming in for possible side effect of levaquin. She was taking it from the ER for sinus infection and starting Friday (about 8 days into therapy) started having nausea, anxiety, tremors and dizziness. She is very concerned as she was reading about the black box warning about CNS symptoms and thinks that this is what she is having. She was trying to read clinical studies about how long these symptoms last and could not find out. She had old klonopin from her psych provider about 40 years old and took some of those for this. They did help some but made her feel spaced out. Her husband is concerned she may be pregnant although she does have nuvaring in and no problems with it. She does not have periods with this. She has been taking zofran we called in and also meclizine over the counter for dizziness. She is crying a lot of the time and feeling anxious a lot of the time. Does have history of depression and states that a prior episode started with a lot of crying.   Review of Systems  Constitutional: Positive for activity change and appetite change. Negative for chills, fatigue, fever and unexpected weight change.  HENT: Negative.   Eyes: Negative.   Respiratory: Negative.   Cardiovascular: Negative.   Gastrointestinal: Positive for nausea.  Musculoskeletal: Negative.   Skin: Negative.   Neurological: Positive for dizziness. Negative for seizures, speech difficulty, weakness, light-headedness and numbness.  Psychiatric/Behavioral: Positive for agitation, decreased concentration and dysphoric mood. The patient is nervous/anxious.       Objective:   Physical Exam  Constitutional: She is oriented to person, place, and time. She appears well-developed and well-nourished.  HENT:  Head: Normocephalic and atraumatic.  Right Ear: External ear normal.  Left Ear:  External ear normal.  Mouth/Throat: Oropharynx is clear and moist.  Eyes: EOM are normal.  Neck: Normal range of motion.  Cardiovascular: Normal rate and regular rhythm.   Pulmonary/Chest: Effort normal and breath sounds normal. No respiratory distress. She has no wheezes. She has no rales.  Abdominal: Soft.  Neurological: She is alert and oriented to person, place, and time.  Skin: Skin is warm and dry.  Psychiatric:  Seems scattered during the visit and somewhat anxious   Vitals:   12/31/16 0814  BP: (!) 152/98  Pulse: 99  Temp: 98.3 F (36.8 C)  TempSrc: Oral  SpO2: 99%  Weight: 242 lb (109.8 kg)  Height: 5\' 10"  (1.778 m)  Noted negative POC urine pregnancy test    Assessment & Plan:

## 2017-01-07 ENCOUNTER — Encounter: Payer: Self-pay | Admitting: Internal Medicine

## 2017-01-07 ENCOUNTER — Ambulatory Visit (INDEPENDENT_AMBULATORY_CARE_PROVIDER_SITE_OTHER): Payer: 59 | Admitting: Internal Medicine

## 2017-01-07 VITALS — BP 118/86 | HR 92 | Temp 98.6°F | Ht 70.0 in | Wt 237.0 lb

## 2017-01-07 DIAGNOSIS — R42 Dizziness and giddiness: Secondary | ICD-10-CM | POA: Diagnosis not present

## 2017-01-07 DIAGNOSIS — J329 Chronic sinusitis, unspecified: Secondary | ICD-10-CM | POA: Insufficient documentation

## 2017-01-07 DIAGNOSIS — H6980 Other specified disorders of Eustachian tube, unspecified ear: Secondary | ICD-10-CM | POA: Insufficient documentation

## 2017-01-07 DIAGNOSIS — F341 Dysthymic disorder: Secondary | ICD-10-CM | POA: Diagnosis not present

## 2017-01-07 DIAGNOSIS — H6692 Otitis media, unspecified, left ear: Secondary | ICD-10-CM | POA: Diagnosis not present

## 2017-01-07 DIAGNOSIS — H6982 Other specified disorders of Eustachian tube, left ear: Secondary | ICD-10-CM | POA: Diagnosis not present

## 2017-01-07 MED ORDER — AZITHROMYCIN 250 MG PO TABS: ORAL_TABLET | ORAL | 1 refills | Status: DC

## 2017-01-07 MED ORDER — LORAZEPAM 1 MG PO TABS: 1.0000 mg | ORAL_TABLET | Freq: Two times a day (BID) | ORAL | 0 refills | Status: DC | PRN

## 2017-01-07 MED FILL — LORazepam 1 MG TABS: 1 | 20 days supply | Qty: 40 | Fill #0

## 2017-01-07 MED FILL — AZITHROMYCIN 250 MG TABLET: 250 | 5 days supply | Qty: 6 | Fill #0

## 2017-01-07 NOTE — Patient Instructions (Signed)
Please take all new medication as prescribed - the zpack, and ativan as needed  You can also take Mucinex (or it's generic off brand) for congestion, and tylenol as needed for pain.  Please continue all other medications as before, and refills have been done if requested.  Please have the pharmacy call with any other refills you may need.  Please continue your efforts at being more active, low cholesterol diet, and weight control.  Please keep your appointments with your specialists as you may have planned

## 2017-01-07 NOTE — Progress Notes (Signed)
Subjective:    [name], female    DOB: November 18, 1976, 41 y.o.   MRN: [id number]  HPI  Here to f/u recent visit feb 13 with Dr Sharlet Salina, /p levaquin 8/10 days before had to stop due to nausea, which has seemed to cause or associated with anxiety, tremulous, shakiness, frequent crying, Denies worsening depressive symptoms, no suicidal ideation. Yesterday despite finishing levaquin has had all day n/v multiple episodes.  Pt denies fever, wt loss, night sweats, loss of appetite, or other constitutional symptoms Denies worsening reflux, abd pain, dysphagia, n/v, bowel change or blood, takes nexium every day without reflux.  No hx of bowel obstruction.  + BM and gas daily, no diarrhea, BRB.  No sick contacts.  Works at the [employer] did not help. No worsening home stressors but with worsening recent symptoms took her first pill of the klonopin rx per Dr Plovsky/psychiatry from 2 yrs ago.  Pt called today for appt in 2 wks with psychiatry, not able to be seen prior   C/o persistent acute onset fever, facial pain, pressure, lear ear pain and headache, general weakness and malaise, and greenish d/c, assoc with vertigo on position change as well.  Has bilat ear crackling, popping, and reduced left hearing.   Past Medical History:  Diagnosis Date  . ALLERGIC RHINITIS   . ANXIETY DEPRESSION   . CONSTIPATION   . DEPRESSION   . Esophageal reflux   . HYPERCHOLESTEROLEMIA   . OBESITY   . Occipital neuralgia   . Palpitations    Past Surgical History:  Procedure Laterality Date  . NO PAST SURGERIES      reports that she has never smoked. She has never used smokeless tobacco. She reports that she does not drink alcohol or use drugs. family history includes Breast cancer (age of onset: 65) in her mother; Coronary artery disease in her father; Diabetes in her mother; Hyperlipidemia in her brother, father, and mother; Hypertension in her mother; Osteoarthritis in her  mother. Allergies  Allergen Reactions  . Levaquin [Levofloxacin] Anxiety    Possible CNS side effects.    Current Outpatient Prescriptions on File Prior to Visit  Medication Sig Dispense Refill  . albuterol (PROAIR HFA) 108 (90 Base) MCG/ACT inhaler Inhale 2 puffs into the lungs every 6 (six) hours as needed for wheezing or shortness of breath. 1 Inhaler 0  . cetirizine (ZYRTEC) 10 MG tablet Take 1 tablet (10 mg total) by mouth daily. 30 tablet 3  . cyclobenzaprine (FLEXERIL) 5 MG tablet Take 1 tablet (5 mg total) by mouth 3 (three) times daily as needed for muscle spasms. 40 tablet 1  . docusate sodium (COLACE) 100 MG capsule Take 1 capsule (100 mg total) by mouth 2 (two) times daily. 10 capsule 3  . escitalopram (LEXAPRO) 10 MG tablet Take one and 1/2 tab by mouth per day (Patient taking differently: Take 20 mg by mouth every evening. ) 135 tablet 3  . esomeprazole (NEXIUM) 20 MG capsule Take 20 mg by mouth daily at 12 noon.    . etonogestrel-ethinyl estradiol (NUVARING) 0.12-0.015 MG/24HR vaginal ring Place 1 each vaginally every 28 (twenty-eight) days. Insert vaginally and leave in place for 3 consecutive weeks, then remove for 1 week.     . fenofibrate micronized (LOFIBRA) 67 MG capsule Take 1 capsule (67 mg total) by mouth daily before breakfast. 90 capsule 3  . fluticasone (FLONASE) 50 MCG/ACT nasal spray Place 2 sprays into both nostrils 2 (  two) times daily as needed. 16 g 5  . gabapentin (NEURONTIN) 600 MG tablet Take 900 mg by mouth every evening    . montelukast (SINGULAIR) 10 MG tablet Take 1 tablet (10 mg total) by mouth at bedtime. 90 tablet 3  . Multiple Vitamin (MULTIVITAMIN) tablet Take 1 tablet by mouth daily.      . ondansetron (ZOFRAN) 4 MG tablet Take 1 tablet (4 mg total) by mouth every 8 (eight) hours as needed for nausea or vomiting. 90 tablet 0  . simvastatin (ZOCOR) 40 MG tablet Take 1 tablet (40 mg total) by mouth at bedtime. 90 tablet 3  . tiZANidine (ZANAFLEX) 4 MG  tablet Take 1 tablet (4 mg total) by mouth Nightly. 30 tablet 2  . triamcinolone cream (KENALOG) 0.1 % Apply 1 application topically 2 (two) times daily. 30 g 0  . vitamin B-12 (CYANOCOBALAMIN) 1000 MCG tablet Take 1,000 mcg by mouth daily.    . vitamin C (ASCORBIC ACID) 500 MG tablet Take 500 mg by mouth daily.      Marland Kitchen zolpidem (AMBIEN) 10 MG tablet Take 1 tablet (10 mg total) by mouth at bedtime as needed for sleep. 30 tablet 3   No current facility-administered medications on file prior to visit.    Review of Systems  Constitutional: Negative for unusual diaphoresis or night sweats HENT: Negative for ear swelling or discharge Eyes: Negative for worsening visual haziness  Respiratory: Negative for choking and stridor.   Gastrointestinal: Negative for distension or worsening eructation Genitourinary: Negative for retention or change in urine volume.  Musculoskeletal: Negative for other MSK pain or swelling Skin: Negative for color change and worsening wound Neurological: Negative for tremors and numbness other than noted  Psychiatric/Behavioral: Negative for decreased concentration or agitation other than above   All other system neg per pt    Objective:   Physical Exam BP 118/86   Pulse 92   Temp 98.6 F (37 C)   Ht 5\' 10"  (1.778 m)   Wt 237 lb (107.5 kg)   SpO2 98%   BMI 34.01 kg/m  VS noted, mild ill Constitutional: Pt appears in no apparent distress HENT: Head: NCAT.  Right Ear: External ear normal.  Left Ear: External ear normal.  Eyes: . Pupils are equal, round, and reactive to light. Conjunctivae and EOM are normal Left tm's with severe erythema.  Max sinus areas mild tender.  Pharynx with mild erythema, no exudate Neck: Normal range of motion. Neck supple.  Cardiovascular: Normal rate and regular rhythm.   Pulmonary/Chest: Effort normal and breath sounds without rales or wheezing.  Neurological: Pt is alert. Not confused , motor grossly intact Skin: Skin is warm. No  rash, no LE edema Psychiatric: Pt behavior is normal. No agitation. 1-2+ nervous , tearful No other exam findings    Assessment & Plan:

## 2017-01-12 NOTE — Assessment & Plan Note (Signed)
Mild to mod, for mucinex otc prn,  to f/u any worsening symptoms or concerns 

## 2017-01-12 NOTE — Assessment & Plan Note (Signed)
[facility] for limited ativan rx, for f/u psychiatry as planned,  to f/u any worsening symptoms or concerns

## 2017-01-12 NOTE — Assessment & Plan Note (Signed)
Mild to mod, for antibx course,  to f/u any worsening symptoms or concerns 

## 2017-01-12 NOTE — Assessment & Plan Note (Signed)
Likely source of n/v, related to URI symptoms, declines antivert for now,  to f/u any worsening symptoms or concerns

## 2017-01-13 ENCOUNTER — Encounter: Payer: Self-pay | Admitting: Internal Medicine

## 2017-01-14 ENCOUNTER — Encounter: Payer: Self-pay | Admitting: Internal Medicine

## 2017-01-16 ENCOUNTER — Ambulatory Visit (INDEPENDENT_AMBULATORY_CARE_PROVIDER_SITE_OTHER): Payer: 59 | Admitting: Psychology

## 2017-01-16 ENCOUNTER — Encounter: Payer: Self-pay | Admitting: Internal Medicine

## 2017-01-16 DIAGNOSIS — F411 Generalized anxiety disorder: Secondary | ICD-10-CM | POA: Diagnosis not present

## 2017-01-16 MED ORDER — ESOMEPRAZOLE MAGNESIUM 20 MG PO CPDR: 20.0000 mg | DELAYED_RELEASE_CAPSULE | Freq: Every day | ORAL | 1 refills | Status: DC

## 2017-01-16 MED ORDER — ESCITALOPRAM OXALATE 20 MG PO TABS: 20.0000 mg | ORAL_TABLET | Freq: Every evening | ORAL | 1 refills | Status: DC

## 2017-01-16 MED FILL — ESCITALOPRAM 20 MG TABLET: 20 | 90 days supply | Qty: 90 | Fill #0

## 2017-01-17 ENCOUNTER — Encounter: Payer: Self-pay | Admitting: Internal Medicine

## 2017-01-17 MED FILL — ESOMEPRAZOLE MAG DR 40 MG C: 40 | 90 days supply | Qty: 90 | Fill #0

## 2017-01-17 MED FILL — SIMVASTATIN 40 MG TABLET: 40 | 90 days supply | Qty: 90 | Fill #2

## 2017-01-17 MED FILL — FENOFIBRATE 67 MG CAPSULE: 67 | 90 days supply | Qty: 90 | Fill #2

## 2017-01-21 ENCOUNTER — Ambulatory Visit: Payer: Self-pay | Admitting: Internal Medicine

## 2017-01-23 ENCOUNTER — Ambulatory Visit: Payer: 59 | Admitting: Psychology

## 2017-01-27 ENCOUNTER — Ambulatory Visit (INDEPENDENT_AMBULATORY_CARE_PROVIDER_SITE_OTHER): Payer: 59 | Admitting: Psychology

## 2017-01-27 DIAGNOSIS — F331 Major depressive disorder, recurrent, moderate: Secondary | ICD-10-CM | POA: Diagnosis not present

## 2017-02-03 MED FILL — MONTELUKAST SOD 10 MG TAB: 10 | 90 days supply | Qty: 90 | Fill #3

## 2017-02-10 ENCOUNTER — Encounter: Payer: Self-pay | Admitting: Internal Medicine

## 2017-02-11 MED ORDER — HYDROXYZINE HCL 25 MG PO TABS: 25.0000 mg | ORAL_TABLET | Freq: Three times a day (TID) | ORAL | 1 refills | Status: DC | PRN

## 2017-02-11 NOTE — Telephone Encounter (Signed)
Done erx 

## 2017-02-12 ENCOUNTER — Other Ambulatory Visit: Payer: Self-pay | Admitting: Internal Medicine

## 2017-02-12 ENCOUNTER — Ambulatory Visit (INDEPENDENT_AMBULATORY_CARE_PROVIDER_SITE_OTHER): Payer: 59 | Admitting: Psychology

## 2017-02-12 ENCOUNTER — Encounter: Payer: Self-pay | Admitting: Internal Medicine

## 2017-02-12 DIAGNOSIS — F411 Generalized anxiety disorder: Secondary | ICD-10-CM | POA: Diagnosis not present

## 2017-02-13 ENCOUNTER — Telehealth: Payer: Self-pay | Admitting: Internal Medicine

## 2017-02-13 ENCOUNTER — Telehealth: Payer: Self-pay

## 2017-02-13 MED ORDER — BUSPIRONE HCL 10 MG PO TABS: 10.0000 mg | ORAL_TABLET | Freq: Three times a day (TID) | ORAL | 3 refills | Status: DC

## 2017-02-13 MED FILL — busPIRone HCL 10 MG TABS: 10 | 30 days supply | Qty: 90 | Fill #0

## 2017-02-13 NOTE — Telephone Encounter (Signed)
I am not taking transfers at this time unfortunately.

## 2017-02-13 NOTE — Telephone Encounter (Signed)
Patient is requesting to transfer from April [name] to April [name].  Please advise.

## 2017-02-13 NOTE — Telephone Encounter (Signed)
Pt called stating that she needs a refill of her Buspar. I did not see it on her current med list to pend for you. She stated that it was originally prescribed by Dr. Sharlet Salina when she was not able to see you. However she can not see psych until next month on the 13th. She needs a additional refill to hold her over until then. Please advise.

## 2017-02-13 NOTE — Addendum Note (Signed)
Addended by: Biagio Borg on: 02/13/2017 11:11 AM   Modules accepted: Orders

## 2017-02-13 NOTE — Telephone Encounter (Signed)
Pt informed

## 2017-02-13 NOTE — Telephone Encounter (Signed)
See below

## 2017-02-13 NOTE — Telephone Encounter (Signed)
Patient has sent a message through my chart in regard to her not being able to get medications refilled. I only see one medication request from her pharmacy and that was for a medication that is no longer on her current medication list.  Can you please follow up with patient in regard to getting her medications refilled.  Thanks!

## 2017-02-13 NOTE — Telephone Encounter (Addendum)
Sorry, I think this is second message for this, and for some reason I sent atarax for anxiety instead of buspar  I will change to the buspar, but I doubt dr crawford has done this in the past as it is not on the prior med list

## 2017-02-13 NOTE — Telephone Encounter (Signed)
Ok with me, thanks.

## 2017-02-17 ENCOUNTER — Ambulatory Visit: Payer: Self-pay | Admitting: Internal Medicine

## 2017-02-17 NOTE — Telephone Encounter (Signed)
Called patient back to notify.  Patient is going to stay with Dr. Jenny Reichmann for now.

## 2017-02-20 MED FILL — NUVARING VAGINAL RING: 0.12-0.015 | 84 days supply | Qty: 3 | Fill #3

## 2017-02-21 ENCOUNTER — Ambulatory Visit (INDEPENDENT_AMBULATORY_CARE_PROVIDER_SITE_OTHER): Payer: 59 | Admitting: Psychiatry

## 2017-02-21 ENCOUNTER — Encounter (HOSPITAL_COMMUNITY): Payer: Self-pay | Admitting: Psychiatry

## 2017-02-21 VITALS — BP 128/82 | HR 96

## 2017-02-21 DIAGNOSIS — F323 Major depressive disorder, single episode, severe with psychotic features: Secondary | ICD-10-CM | POA: Diagnosis not present

## 2017-02-21 DIAGNOSIS — Z79899 Other long term (current) drug therapy: Secondary | ICD-10-CM | POA: Diagnosis not present

## 2017-02-21 MED ORDER — BUSPIRONE HCL 15 MG PO TABS: 10.0000 mg | ORAL_TABLET | Freq: Two times a day (BID) | ORAL | 5 refills | Status: DC

## 2017-02-21 MED ORDER — VORTIOXETINE HBR 10 MG PO TABS: 10.0000 mg | ORAL_TABLET | ORAL | 30 refills | Status: AC

## 2017-02-21 MED ORDER — CLONAZEPAM 1 MG PO TABS: 1.0000 mg | ORAL_TABLET | Freq: Every day | ORAL | 3 refills | Status: DC

## 2017-02-21 MED FILL — clonazePAM 1 MG TABS: 1 | 30 days supply | Qty: 30 | Fill #0

## 2017-02-21 MED FILL — busPIRone HCL 15 MG TABS: 15 | 60 days supply | Qty: 60 | Fill #0

## 2017-02-21 NOTE — Progress Notes (Signed)
[facility] MD Progress Note  02/21/2017 11:27 AM April [name]  MRN:  [id number] Subjective: Doing great Principal Problem: Major depression, recurrent, remission Diagnosis:  Major depression, recurrent, remission  This patient is well-known to me. I treated for clinical depression and she had a complication with her sex drive. She took Lexapro and has a good response but unfortunately lower sex drive which led to addition of Wellbutrin which did not help. The patient is been out of care for about a year in a few months and was getting Lexapro now from her primary care doctor. 2 months ago she developed a sinus infection and was treated with Levaquin. Shortly after the treatment she began experiencing a persistent chronic tremor, persistent anxiety lability crying nausea and result in loss of appetite. She's lost 25 pounds over the last couple months. She describes feeling very sad and depressed and frightened. She has no specific psychosocial stressors in her life at this time. She is a stable job as a Software engineer and has a good stable marriage. She has no significant new psychosocial stressors other than feeling afflicted from Levaquin. 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 The patient is also BuSpar which helps her minimally mainly in the afternoon. She continuously takes Lexapro 20 mg. Her sleep is only fairly good. Normally she sleeps to the night and has normal sleep but it is not quite normal. She believes melatonin has helped some. Her energy level is maintained. She still thinking concentrating at work. She denies feeling worthlessness she is not suicidal. She denies the use of alcohol or any drugs. She does not have any psychosis. She's never had a history of mania has none at this time. She denies symptoms of OCD panic disorder or any other anxiety disorder. This set of symptoms of persistent little change over the last  7 weeks. Patient Active Problem List   Diagnosis Date Noted  . Left otitis media [H66.92] 01/07/2017  . Eustachian tube dysfunction [H69.80] 01/07/2017  . Chronic sinusitis [J32.9] 01/07/2017  . N&V (nausea and vomiting) [R11.2] 12/31/2016  . Vertigo [R42] 12/31/2016  . 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 Nonallopathic lesion of thoracic region [M99.9] 06/03/2016  . Nonallopathic lesion-rib cage [M99.9] 06/03/2016  . Nonallopathic lesion of cervical region [M99.9] 06/03/2016  . Rash [R21] 05/22/2016  . Thoracic back pain [M54.6] 05/22/2016  . Hyperglycemia [R73.9] 05/03/2016  . Encounter for well adult exam with abnormal findings [N27.78] 05/04/2015  . Left hamstring injury [S76.302A] 01/24/2015  . Major depressive disorder, recurrent episode, moderate ([place]) [F33.1] 12/02/2014  . Occipital neuralgia [M54.81]   . ANEMIA-NOS [D64.9] 12/11/2009  . Hyperlipidemia [E78.5] 11/16/2008  . Obesity [E66.9] 11/16/2008  . ANXIETY DEPRESSION [F34.1] 11/16/2008  . Allergic rhinitis [J30.9] 11/16/2008  . Esophageal reflux [K21.9] 11/16/2008  . Constipation [K59.00] 11/16/2008  . Palpitations [R00.2] 11/16/2008   Total Time spent with patient: 30 minutes  Past Psychiatric History:   Past Medical History:  Past Medical History:  Diagnosis Date  . ALLERGIC RHINITIS   . ANXIETY DEPRESSION   . CONSTIPATION   . DEPRESSION   . Esophageal reflux   . HYPERCHOLESTEROLEMIA   . OBESITY   . Occipital neuralgia   . Palpitations     Past Surgical History:  Procedure Laterality Date  . NO PAST SURGERIES     Family History:  Family History  Problem Relation Age of Onset  . Hypertension Mother   .  Diabetes Mother   . Hyperlipidemia Mother   . Breast cancer Mother 3  . Osteoarthritis Mother   . Hyperlipidemia Father   . Coronary artery disease Father     MI age 27  . Hyperlipidemia Brother    Family Psychiatric  History:  Social History:  History  Alcohol Use No     History   Drug Use No    Social History   Social History  . Marital status: Married    Spouse name: N/A  . Number of children: N/A  . Years of education: N/A   Social History Main Topics  . Smoking status: Never Smoker  . Smokeless tobacco: Never Used     Comment: Married, lives with spouse  . Alcohol use No  . Drug use: No  . Sexual activity: Yes    Partners: Male    Birth control/ protection: Pill   Other Topics Concern  . None   Social History Narrative  . None   Additional Social History:                         Sleep: Good  Appetite:  Good  Current Medications: Current Outpatient Prescriptions  Medication Sig Dispense Refill  . busPIRone (BUSPAR) 15 MG tablet Take 0.5 tablets (7.5 mg total) by mouth 2 (two) times daily. As needed 60 tablet 5  . cetirizine (ZYRTEC) 10 MG tablet Take 1 tablet (10 mg total) by mouth daily. 30 tablet 3  . escitalopram (LEXAPRO) 20 MG tablet Take 1 tablet (20 mg total) by mouth every evening. 90 tablet 1  . esomeprazole (NEXIUM) 20 MG capsule Take 1 capsule (20 mg total) by mouth daily at 12 noon. 90 capsule 1  . etonogestrel-ethinyl estradiol (NUVARING) 0.12-0.015 MG/24HR vaginal ring Place 1 each vaginally every 28 (twenty-eight) days. Insert vaginally and leave in place for 3 consecutive weeks, then remove for 1 week.     . gabapentin (NEURONTIN) 600 MG tablet Take 900 mg by mouth every evening    . LORazepam (ATIVAN) 1 MG tablet Take 1 tablet (1 mg total) by mouth 2 (two) times daily as needed for anxiety. 40 tablet 0  . meclizine (ANTIVERT) 25 MG tablet     . montelukast (SINGULAIR) 10 MG tablet Take 1 tablet (10 mg total) by mouth at bedtime. 90 tablet 3  . Multiple Vitamin (MULTIVITAMIN) tablet Take 1 tablet by mouth daily.      . Omega-3 Fatty Acids (FISH OIL) 1000 MG CAPS Take 1,000 mg by mouth 2 (two) times daily.    . ondansetron (ZOFRAN) 4 MG tablet Take 1 tablet (4 mg total) by mouth every 8 (eight) hours as needed for  nausea or vomiting. 90 tablet 0  . vitamin B-12 (CYANOCOBALAMIN) 1000 MCG tablet Take 1,000 mcg by mouth daily.    . vitamin C (ASCORBIC ACID) 500 MG tablet Take 500 mg by mouth daily.      Marland Kitchen albuterol (PROAIR HFA) 108 (90 Base) MCG/ACT inhaler Inhale 2 puffs into the lungs every 6 (six) hours as needed for wheezing or shortness of breath. (Patient not taking: Reported on 02/21/2017) 1 Inhaler 0  . azithromycin (ZITHROMAX Z-PAK) 250 MG tablet 2 tabs on day 1, then 1 per day (Patient not taking: Reported on 02/21/2017) 6 tablet 1  . clonazePAM (KLONOPIN) 1 MG tablet Take 1 tablet (1 mg total) by mouth at bedtime. 30 tablet 3  . cyclobenzaprine (FLEXERIL) 5 MG tablet Take 1  tablet (5 mg total) by mouth 3 (three) times daily as needed for muscle spasms. (Patient not taking: Reported on 02/21/2017) 40 tablet 1  . docusate sodium (COLACE) 100 MG capsule Take 1 capsule (100 mg total) by mouth 2 (two) times daily. (Patient not taking: Reported on 02/21/2017) 10 capsule 3  . fenofibrate micronized (LOFIBRA) 67 MG capsule Take 1 capsule (67 mg total) by mouth daily before breakfast. (Patient not taking: Reported on 02/21/2017) 90 capsule 3  . fluconazole (DIFLUCAN) 150 MG tablet   3  . fluticasone (FLONASE) 50 MCG/ACT nasal spray Place 2 sprays into both nostrils 2 (two) times daily as needed. (Patient not taking: Reported on 02/21/2017) 16 g 5  . simvastatin (ZOCOR) 40 MG tablet Take 1 tablet (40 mg total) by mouth at bedtime. (Patient not taking: Reported on 02/21/2017) 90 tablet 3  . tiZANidine (ZANAFLEX) 4 MG tablet Take 1 tablet (4 mg total) by mouth Nightly. (Patient not taking: Reported on 02/21/2017) 30 tablet 2  . triamcinolone cream (KENALOG) 0.1 % Apply 1 application topically 2 (two) times daily. (Patient not taking: Reported on 02/21/2017) 30 g 0  . vortioxetine HBr (TRINTELLIX) 10 MG TABS Take 1 tablet (10 mg total) by mouth 1 day or 1 dose. 30 tablet 30  . zolpidem (AMBIEN) 10 MG tablet Take 1 tablet (10 mg  total) by mouth at bedtime as needed for sleep. 30 tablet 3   No current facility-administered medications for this visit.     Lab Results: No results found for this or any previous visit (from the past 48 hour(s)).  Blood Alcohol level:  No results found for: [facility]  Physical Findings: AIMS:  , ,  ,  ,    CIWA:    COWS:     Musculoskeletal: Strength & Muscle Tone: within normal limits Gait & Station: normal Patient leans: N/A  Psychiatric Specialty Exam: ROS  Blood pressure 128/82, pulse 96, SpO2 97 %.There is no height or weight on file to calculate BMI.  General Appearance: Casual  Eye Contact::  Good  Speech:  Clear and Coherent  Volume:  Normal  Mood:  Euthymic  Affect:  Appropriate  Thought Process:  Coherent  Orientation:  Full (Time, Place, and Person)  Thought Content:  WDL  Suicidal Thoughts:  No  Homicidal Thoughts:  No  Memory:  NA  Judgement:  Good  Insight:  Good  Psychomotor Activity:  Normal  Concentration:  Good  Recall:  Good  Fund of Knowledge:Good  Language: Good  Akathisia:  No  Handed:  Right  AIMS (if indicated):     Assets:  Desire for Improvement  ADL's:  Intact  Cognition: WNL  Sleep:      Treatment Plan Summary: At this time our approach will mainly be symptomatic.Marland Kitchen She states that the BuSpar does help later in the afternoon. She takes 10 mg in the morning with minimal benefits but by the afternoon when she takes another 10 she feels somewhat better. She still continues to have tremor and feel nauseated. At this time we'll go ahead and ask her to increase the BuSpar to taking 15 mg twice a day. Also history take 1 mg of Klonopin at night. Hopefully this will help her sleep through the night and perhaps make her feel less anxious the next day. At this time we will take her Lexapro and reduce it to 10 mg for a few days and then stop and begin on Trintelix 10 mg. It is my  hope that this last agent has some effects similar to Zofran and has  therefore the nausea effect. Nausea and resulting anorexia I think is affecting her mood state. It is my hope that the Klonopin might help her daytime tremor tremor as well. When she returns to see [place] in 5 weeks we'll consider adding propranolol for anxiety and tremor and possibly increasing her Trintelix. If she is not better I would also consider a neurological consult. The patient is not suicidal. She is very distressed by these circumstances. We asked her to call us back if there are any problems with these changes.  Jerral Ralph, MD 02/21/2017, 11:27 AM

## 2017-02-24 DIAGNOSIS — R51 Headache: Secondary | ICD-10-CM | POA: Diagnosis not present

## 2017-02-24 DIAGNOSIS — G43719 Chronic migraine without aura, intractable, without status migrainosus: Secondary | ICD-10-CM | POA: Diagnosis not present

## 2017-02-24 MED FILL — GABAPENTIN 600 MG TABLET: 600 | 30 days supply | Qty: 90 | Fill #0

## 2017-02-24 MED FILL — TRINTELLIX 10 MG TABLET: 10 | 30 days supply | Qty: 30 | Fill #0

## 2017-02-25 DIAGNOSIS — Z7189 Other specified counseling: Secondary | ICD-10-CM | POA: Diagnosis not present

## 2017-02-28 ENCOUNTER — Ambulatory Visit (HOSPITAL_COMMUNITY): Payer: Self-pay | Admitting: Psychiatry

## 2017-03-01 DIAGNOSIS — Z7189 Other specified counseling: Secondary | ICD-10-CM | POA: Diagnosis not present

## 2017-03-05 ENCOUNTER — Encounter: Payer: Self-pay | Admitting: Family Medicine

## 2017-03-07 ENCOUNTER — Ambulatory Visit (INDEPENDENT_AMBULATORY_CARE_PROVIDER_SITE_OTHER): Payer: 59 | Admitting: Psychology

## 2017-03-07 ENCOUNTER — Ambulatory Visit (INDEPENDENT_AMBULATORY_CARE_PROVIDER_SITE_OTHER): Payer: 59 | Admitting: Sports Medicine

## 2017-03-07 ENCOUNTER — Encounter: Payer: Self-pay | Admitting: Sports Medicine

## 2017-03-07 VITALS — BP 124/82 | HR 82 | Ht 70.0 in | Wt 228.2 lb

## 2017-03-07 DIAGNOSIS — M546 Pain in thoracic spine: Secondary | ICD-10-CM

## 2017-03-07 DIAGNOSIS — M9908 Segmental and somatic dysfunction of rib cage: Secondary | ICD-10-CM

## 2017-03-07 DIAGNOSIS — M9901 Segmental and somatic dysfunction of cervical region: Secondary | ICD-10-CM

## 2017-03-07 DIAGNOSIS — F4323 Adjustment disorder with mixed anxiety and depressed mood: Secondary | ICD-10-CM

## 2017-03-07 DIAGNOSIS — M9902 Segmental and somatic dysfunction of thoracic region: Secondary | ICD-10-CM

## 2017-03-07 NOTE — Progress Notes (Signed)
OFFICE VISIT NOTE Juanda Bond. [place], [place] [facility] [place] - 41 y.o. female MRN [id number]  Date of birth: August 07, 1976  Visit Date: 03/07/2017  PCP: Biagio Borg, MD   Referred by: Biagio Borg, MD   Sharion Dove. Tyria Springer LAT, ATC  acting as scribe for Dr. Paulla Fore.  SUBJECTIVE:   Chief Complaint  Patient presents with  . rib slip   Left side rib slip. "about bra level" Occurred Wednesday morning. Chronic issue.   The pain is described as dull achy pain and is rated as 2-6 (currently about 2 today)  Worsened with twisting of torso or general movement Improves with OMT Therapies tried include OMT with Dr. Charlann Boxer  Other associated symptoms include: none.  She denies any significant shortness of breath.  Pain with deep inhalation.     Review of Systems  Constitutional: Negative for chills, fever, malaise/fatigue and weight loss.  Musculoskeletal: Positive for back pain and joint pain.    Otherwise per HPI.  HISTORY & PERTINENT PRIOR DATA:  No specialty comments available. She reports that she has never smoked. She has never used smokeless tobacco.   Recent Labs  05/03/16 0952  HGBA1C 5.6   Medications & Allergies reviewed per EMR Patient Active Problem List   Diagnosis Date Noted  . Left otitis media 01/07/2017  . Eustachian tube dysfunction 01/07/2017  . Chronic sinusitis 01/07/2017  . N&V (nausea and vomiting) 12/31/2016  . Vertigo 12/31/2016  . 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 Nonallopathic lesion of thoracic region 06/03/2016  . Nonallopathic lesion-rib cage 06/03/2016  . Nonallopathic lesion of cervical region 06/03/2016  . Rash 05/22/2016  . Acute left-sided thoracic back pain 05/22/2016  . Hyperglycemia 05/03/2016  . Encounter for well adult exam with abnormal findings 05/04/2015  . Left hamstring injury 01/24/2015  . Major depressive disorder, recurrent episode, moderate  ([place]) 12/02/2014  . Occipital neuralgia   . ANEMIA-NOS 12/11/2009  . Hyperlipidemia 11/16/2008  . Obesity 11/16/2008  . ANXIETY DEPRESSION 11/16/2008  . Allergic rhinitis 11/16/2008  . Esophageal reflux 11/16/2008  . Constipation 11/16/2008  . Palpitations 11/16/2008   Past Medical History:  Diagnosis Date  . ALLERGIC RHINITIS   . ANXIETY DEPRESSION   . CONSTIPATION   . DEPRESSION   . Esophageal reflux   . HYPERCHOLESTEROLEMIA   . OBESITY   . Occipital neuralgia   . Palpitations    Family History  Problem Relation Age of Onset  . Hypertension Mother   . Diabetes Mother   . Hyperlipidemia Mother   . Breast cancer Mother 62  . Osteoarthritis Mother   . Hyperlipidemia Father   . Coronary artery disease Father        MI age 10  . Hyperlipidemia Brother    Past Surgical History:  Procedure Laterality Date  . NO PAST SURGERIES     Social History   Occupational History  . Not on file.   Social History Main Topics  . Smoking status: Never Smoker  . Smokeless tobacco: Never Used     Comment: Married, lives with spouse  . Alcohol use No  . Drug use: No  . Sexual activity: Yes    Partners: Male    Birth control/ protection: Pill    OBJECTIVE:  VS:  HT:5\' 10"  (177.8 cm)   WT:228 lb 3.2 oz (103.5 kg)  BMI:32.8    BP:124/82  HR:82bpm  TEMP: ( )  RESP:98 % Physical Exam  IMAGING & PROCEDURES: No results found. Findings:  WDWN, NAD, Non-toxic appearing Alert & appropriately interactive Not depressed or anxious appearing No increased work of breathing. Pupils are equal. EOM intact without nystagmus No clubbing or cyanosis of the extremities appreciated No significant rashes/lesions/ulcerations overlying the examined area. Radial pulses 2+/4.  No significant generalized UE edema. Sensation intact to light touch in upper extremities and chest wall  OSTEOPATHIC/STRUCTURAL EXAM:   OA FRS right C2 through C4 FRS left C6 FRS right T1 extended rotated  left Posterior rib 6 with exhalation dysfunction on the left     ASSESSMENT & PLAN:   Problem List Items Addressed This Visit    Acute left-sided thoracic back pain - Primary    PROCEDURE NOTE : OSTEOPATHIC MANIPULATION The decision today to treat with Osteopathic Manipulative Therapy (OMT) was based on physical exam findings. Verbal consent was obtained after after explanation of risks, benefits and potential side effects, including acute pain flare, post manipulation soreness and need for repeat treatments.  If Cervical manipulation was performed additional time was spent discussing the associated minimal risk of  injury to neurovascular structures.  After consent was obtained manipulation was performed as below:            Regions treated:  Per billing codes          Techniques used:  Direct, Muscle Energy, MFR and HVLA The patient tolerated the treatment well and reported Improved symptoms following treatment today. Patient was given medications, exercises, stretches and lifestyle modifications per AVS and verbally.          Other Visit Diagnoses    Somatic dysfunction of cervical region       Somatic dysfunction of rib region       Somatic dysfunction of thoracic region          Follow-up: Return if symptoms worsen or fail to improve.   CMA/ATC served as Education administrator during this visit. History, Physical, and Plan performed by medical provider. Documentation and orders reviewed and attested to.      Teresa Coombs, [place] [facility] Physician

## 2017-03-17 DIAGNOSIS — Z7189 Other specified counseling: Secondary | ICD-10-CM | POA: Diagnosis not present

## 2017-03-19 ENCOUNTER — Ambulatory Visit (INDEPENDENT_AMBULATORY_CARE_PROVIDER_SITE_OTHER): Payer: 59 | Admitting: Psychiatry

## 2017-03-19 ENCOUNTER — Encounter (HOSPITAL_COMMUNITY): Payer: Self-pay | Admitting: Psychiatry

## 2017-03-19 VITALS — BP 130/86 | HR 83 | Ht 70.0 in | Wt 224.6 lb

## 2017-03-19 DIAGNOSIS — Z79899 Other long term (current) drug therapy: Secondary | ICD-10-CM | POA: Diagnosis not present

## 2017-03-19 DIAGNOSIS — F325 Major depressive disorder, single episode, in full remission: Secondary | ICD-10-CM

## 2017-03-19 MED ORDER — BUSPIRONE HCL 15 MG PO TABS: ORAL_TABLET | ORAL | 5 refills | Status: DC

## 2017-03-19 MED ORDER — TRINTELLIX 10 MG PO TABS: 20.0000 mg | ORAL_TABLET | Freq: Once | ORAL | 4 refills | Status: AC

## 2017-03-19 MED ORDER — PROPRANOLOL HCL 10 MG PO TABS: ORAL_TABLET | ORAL | 5 refills | Status: DC

## 2017-03-19 NOTE — Progress Notes (Signed)
[facility] MD Progress Note  03/19/2017 5:01 PM April [name]  MRN:  [id number] Subjective: Doing great Principal Problem: Major depression, recurrent, remission Diagnosis:  Major depression, recurrent, remission  Today the patient is a little better. Her anxiety is somewhat less. Her tremors are improved. She still has significant anxiety and tremors but they're much better than her last visit. They don't actually affect her ability to work and she continues. But she still is very distressed by what happening to her body from her antibiotic treatment. She is. Her she breaks down and cries. The patient continues to have persistent nausea. This is despite being on Zofran an changing her antidepressant to Trineliix 10 mg. The patient is sleeping fairly well. She continues to feel nausea Effexor appetite. The patient's doctor is Dr. Vladimir Creeks offered much terms of treatment. The patient initially took the antibiotic for sinus infection. Patient says she is. Her heart seems to palpate past. It is very transient and short-lived and doesn't really cause her any distress just something she notices. Patient has no hypertension or diabetes. She is no heart disease.   Patient Active Problem List   Diagnosis Date Noted  . Left otitis media [H66.92] 01/07/2017  . Eustachian tube dysfunction [H69.80] 01/07/2017  . Chronic sinusitis [J32.9] 01/07/2017  . N&V (nausea and vomiting) [R11.2] 12/31/2016  . Vertigo [R42] 12/31/2016  . 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 Nonallopathic lesion of thoracic region [M99.9] 06/03/2016  . Nonallopathic lesion-rib cage [M99.9] 06/03/2016  . Nonallopathic lesion of cervical region [M99.9] 06/03/2016  . Rash [R21] 05/22/2016  . Acute left-sided thoracic back pain [M54.6] 05/22/2016  . Hyperglycemia [R73.9] 05/03/2016  . Encounter for well adult exam with abnormal findings [U20.25] 05/04/2015  . Left hamstring injury [S76.302A] 01/24/2015  . Major depressive  disorder, recurrent episode, moderate ([place]) [F33.1] 12/02/2014  . Occipital neuralgia [M54.81]   . ANEMIA-NOS [D64.9] 12/11/2009  . Hyperlipidemia [E78.5] 11/16/2008  . Obesity [E66.9] 11/16/2008  . ANXIETY DEPRESSION [F34.1] 11/16/2008  . Allergic rhinitis [J30.9] 11/16/2008  . Esophageal reflux [K21.9] 11/16/2008  . Constipation [K59.00] 11/16/2008  . Palpitations [R00.2] 11/16/2008   Total Time spent with patient: 30 minutes  Past Psychiatric History:   Past Medical History:  Past Medical History:  Diagnosis Date  . ALLERGIC RHINITIS   . ANXIETY DEPRESSION   . CONSTIPATION   . DEPRESSION   . Esophageal reflux   . HYPERCHOLESTEROLEMIA   . OBESITY   . Occipital neuralgia   . Palpitations     Past Surgical History:  Procedure Laterality Date  . NO PAST SURGERIES     Family History:  Family History  Problem Relation Age of Onset  . Hypertension Mother   . Diabetes Mother   . Hyperlipidemia Mother   . Breast cancer Mother 27  . Osteoarthritis Mother   . Hyperlipidemia Father   . Coronary artery disease Father     MI age 66  . Hyperlipidemia Brother    Family Psychiatric  History:  Social History:  History  Alcohol Use No     History  Drug Use No    Social History   Social History  . Marital status: Married    Spouse name: N/A  . Number of children: N/A  . Years of education: N/A   Social History Main Topics  . Smoking status: Never Smoker  . Smokeless tobacco: Never Used     Comment: Married, lives with spouse  . Alcohol use No  .  Drug use: No  . Sexual activity: Yes    Partners: Male    Birth control/ protection: Pill   Other Topics Concern  . None   Social History Narrative  . None   Additional Social History:                         Sleep: Good  Appetite:  Good  Current Medications: Current Outpatient Prescriptions  Medication Sig Dispense Refill  . Ashwagandha 500 MG CAPS Take 600 mg by mouth.    . busPIRone (BUSPAR)  15 MG tablet 1  qam   2  q noon 90 tablet 5  . cetirizine (ZYRTEC) 10 MG tablet Take 1 tablet (10 mg total) by mouth daily. 30 tablet 3  . cholecalciferol (VITAMIN D) 1000 units tablet Take 5,000 Units by mouth daily.    . Coenzyme Q10 (COQ-10) 200 MG CAPS Take by mouth.    Lovell Sheehan Thistle (LIVER & KIDNEY CLEANSER PO) Take by mouth 3 (three) times daily.    Marland Kitchen esomeprazole (NEXIUM) 20 MG capsule Take 1 capsule (20 mg total) by mouth daily at 12 noon. 90 capsule 1  . etonogestrel-ethinyl estradiol (NUVARING) 0.12-0.015 MG/24HR vaginal ring Place 1 each vaginally every 28 (twenty-eight) days. Insert vaginally and leave in place for 3 consecutive weeks, then remove for 1 week.     . gabapentin (NEURONTIN) 800 MG tablet Take 900 mg by mouth at bedtime.    Marland Kitchen L-Theanine 100 MG CAPS Take 200 mg by mouth 2 (two) times daily.    Marland Kitchen LORazepam (ATIVAN) 1 MG tablet Take 1 tablet (1 mg total) by mouth 2 (two) times daily as needed for anxiety. 40 tablet 0  . Magnesium Glycinate POWD 200 mg by Does not apply route.    . meclizine (ANTIVERT) 25 MG tablet     . Melatonin 5 MG CAPS Take 4.5 mg by mouth.    . Methylcobalamin 1 MG CHEW Chew 1 tablet by mouth 2 (two) times daily.    . montelukast (SINGULAIR) 10 MG tablet Take 1 tablet (10 mg total) by mouth at bedtime. 90 tablet 3  . Multiple Vitamin (MULTIVITAMIN) tablet Take 1 tablet by mouth daily.      . Multiple Vitamins-Iron (CHLORELLA PO) Take 1 capsule by mouth 2 (two) times daily.    . Omega-3 Fatty Acids (FISH OIL) 1000 MG CAPS Take 1,000 mg by mouth 2 (two) times daily.    . Probiotic Product (SOLUBLE FIBER/PROBIOTICS PO) Take 2 capsules by mouth 2 (two) times daily.    Marland Kitchen pyridoxine (B-6) 100 MG tablet Take 100 mg by mouth 2 (two) times daily.    . S-Adenosylmethionine (SAME) 400 MG TABS Take 1 Units by mouth 2 (two) times daily.    Marland Kitchen selenium 50 MCG TABS tablet Take 50 mcg by mouth daily.    . TRINTELLIX 10 MG TABS Take 2 tablets (20 mg total)  by mouth once. 30 tablet 4  . vitamin C (ASCORBIC ACID) 500 MG tablet Take 500 mg by mouth daily.      . vitamin E 400 UNIT capsule Take 400 Units by mouth daily.    . propranolol (INDERAL) 10 MG tablet 1 bid 60 tablet 5   No current facility-administered medications for this visit.     Lab Results: No results found for this or any previous visit (from the past 48 hour(s)).  Blood Alcohol level:  No results found for: [facility]  Physical  Findings: AIMS:  , ,  ,  ,    CIWA:    COWS:     Musculoskeletal: Strength & Muscle Tone: within normal limits Gait & Station: normal Patient leans: N/A  Psychiatric Specialty Exam: ROS  Blood pressure 130/86, pulse 83, height 5\' 10"  (1.778 m), weight 224 lb 9.6 oz (101.9 kg).Body mass index is 32.23 kg/m.  General Appearance: Casual  Eye Contact::  Good  Speech:  Clear and Coherent  Volume:  Normal  Mood:  Euthymic  Affect:  Appropriate  Thought Process:  Coherent  Orientation:  Full (Time, Place, and Person)  Thought Content:  WDL  Suicidal Thoughts:  No  Homicidal Thoughts:  No  Memory:  NA  Judgement:  Good  Insight:  Good  Psychomotor Activity:  Normal  Concentration:  Good  Recall:  Good  Fund of Knowledge:Good  Language: Good  Akathisia:  No  Handed:  Right  AIMS (if indicated):     Assets:  Desire for Improvement  ADL's:  Intact  Cognition: WNL  Sleep:      Treatment Plan Summary:  At this time we'll go ahead and increase her antidepressant from the 10 mg dose to a 20 mg dose of her  Trinterlix. She'll begin on propranolol 10 mg twice a day and increase her BuSpar to a dose of 15 mg 1 in the morning and 2 at night. I'm trying to target mainly her anxiety. My hope that with the use of a beta blocker she'll have an antianxiety effect without having to go to a benzodiazepine. Hopefully it'll help the tremor that is left over as well as feelings of palpitations. The possibility of getting a consult from infectious disease could  be considered at her next visit we would probably call Dr. Michel Bickers to get his thoughts. At this time the patient is not suicidal. She continues to work.  Jerral Ralph, MD 03/19/2017, 5:01 PM

## 2017-03-20 ENCOUNTER — Other Ambulatory Visit (HOSPITAL_COMMUNITY): Payer: Self-pay

## 2017-03-20 MED ORDER — VORTIOXETINE HBR 20 MG PO TABS: 20.0000 mg | ORAL_TABLET | Freq: Every day | ORAL | 1 refills | Status: DC

## 2017-03-20 MED FILL — TRINTELLIX 20 MG TABLET: 20 | 30 days supply | Qty: 30 | Fill #0

## 2017-03-20 MED FILL — PROPRANOLOL 10 MG TABLET: 10 | 30 days supply | Qty: 60 | Fill #0

## 2017-03-21 ENCOUNTER — Ambulatory Visit (HOSPITAL_COMMUNITY): Payer: Self-pay | Admitting: Psychiatry

## 2017-04-04 ENCOUNTER — Ambulatory Visit (INDEPENDENT_AMBULATORY_CARE_PROVIDER_SITE_OTHER): Payer: 59 | Admitting: Psychology

## 2017-04-04 DIAGNOSIS — F4323 Adjustment disorder with mixed anxiety and depressed mood: Secondary | ICD-10-CM | POA: Diagnosis not present

## 2017-04-08 NOTE — Assessment & Plan Note (Signed)
PROCEDURE NOTE : OSTEOPATHIC MANIPULATION The decision today to treat with Osteopathic Manipulative Therapy (OMT) was based on physical exam findings. Verbal consent was obtained after after explanation of risks, benefits and potential side effects, including acute pain flare, post manipulation soreness and need for repeat treatments.  If Cervical manipulation was performed additional time was spent discussing the associated minimal risk of  injury to neurovascular structures.  After consent was obtained manipulation was performed as below:            Regions treated:  Per billing codes          Techniques used:  Direct, Muscle Energy, MFR and HVLA The patient tolerated the treatment well and reported Improved symptoms following treatment today. Patient was given medications, exercises, stretches and lifestyle modifications per AVS and verbally.

## 2017-04-10 DIAGNOSIS — R110 Nausea: Secondary | ICD-10-CM | POA: Diagnosis not present

## 2017-04-10 DIAGNOSIS — R195 Other fecal abnormalities: Secondary | ICD-10-CM | POA: Diagnosis not present

## 2017-04-10 DIAGNOSIS — R109 Unspecified abdominal pain: Secondary | ICD-10-CM | POA: Diagnosis not present

## 2017-04-10 DIAGNOSIS — R197 Diarrhea, unspecified: Secondary | ICD-10-CM | POA: Diagnosis not present

## 2017-04-12 DIAGNOSIS — Z7189 Other specified counseling: Secondary | ICD-10-CM | POA: Diagnosis not present

## 2017-04-16 DIAGNOSIS — Z7189 Other specified counseling: Secondary | ICD-10-CM | POA: Diagnosis not present

## 2017-04-22 ENCOUNTER — Ambulatory Visit (INDEPENDENT_AMBULATORY_CARE_PROVIDER_SITE_OTHER): Payer: 59 | Admitting: Psychology

## 2017-04-22 DIAGNOSIS — F4323 Adjustment disorder with mixed anxiety and depressed mood: Secondary | ICD-10-CM | POA: Diagnosis not present

## 2017-04-23 DIAGNOSIS — Z7189 Other specified counseling: Secondary | ICD-10-CM | POA: Diagnosis not present

## 2017-04-24 DIAGNOSIS — N6452 Nipple discharge: Secondary | ICD-10-CM | POA: Diagnosis not present

## 2017-04-24 LAB — HM MAMMOGRAPHY

## 2017-04-28 ENCOUNTER — Other Ambulatory Visit: Payer: Self-pay | Admitting: Internal Medicine

## 2017-04-28 MED FILL — GABAPENTIN 600 MG TABLET: 600 | 30 days supply | Qty: 90 | Fill #1

## 2017-04-28 MED FILL — PROPRANOLOL 10 MG TABLET: 10 | 30 days supply | Qty: 60 | Fill #1

## 2017-04-28 MED FILL — ESCITALOPRAM 20 MG TABLET: 20 | 90 days supply | Qty: 90 | Fill #1

## 2017-04-28 MED FILL — ESOMEPRAZOLE MAG DR 40 MG C: 40 | 90 days supply | Qty: 90 | Fill #1

## 2017-04-29 MED FILL — busPIRone HCL 15 MG TABS: 15 | 30 days supply | Qty: 90 | Fill #0

## 2017-04-29 MED FILL — MONTELUKAST SOD 10 MG TAB: 10 | 90 days supply | Qty: 90 | Fill #0

## 2017-05-06 ENCOUNTER — Encounter: Payer: Self-pay | Admitting: Internal Medicine

## 2017-05-06 ENCOUNTER — Other Ambulatory Visit (INDEPENDENT_AMBULATORY_CARE_PROVIDER_SITE_OTHER): Payer: 59

## 2017-05-06 ENCOUNTER — Ambulatory Visit (INDEPENDENT_AMBULATORY_CARE_PROVIDER_SITE_OTHER): Payer: 59 | Admitting: Obstetrics & Gynecology

## 2017-05-06 ENCOUNTER — Encounter: Payer: Self-pay | Admitting: Obstetrics & Gynecology

## 2017-05-06 ENCOUNTER — Ambulatory Visit (INDEPENDENT_AMBULATORY_CARE_PROVIDER_SITE_OTHER): Payer: 59 | Admitting: Internal Medicine

## 2017-05-06 VITALS — BP 120/72 | HR 70 | Ht 70.5 in | Wt 215.0 lb

## 2017-05-06 VITALS — BP 128/70 | Ht 70.5 in | Wt 214.0 lb

## 2017-05-06 DIAGNOSIS — Z0000 Encounter for general adult medical examination without abnormal findings: Secondary | ICD-10-CM

## 2017-05-06 DIAGNOSIS — Z3044 Encounter for surveillance of vaginal ring hormonal contraceptive device: Secondary | ICD-10-CM | POA: Diagnosis not present

## 2017-05-06 DIAGNOSIS — Z114 Encounter for screening for human immunodeficiency virus [HIV]: Secondary | ICD-10-CM

## 2017-05-06 DIAGNOSIS — E669 Obesity, unspecified: Secondary | ICD-10-CM | POA: Diagnosis not present

## 2017-05-06 DIAGNOSIS — E559 Vitamin D deficiency, unspecified: Secondary | ICD-10-CM

## 2017-05-06 DIAGNOSIS — E538 Deficiency of other specified B group vitamins: Secondary | ICD-10-CM

## 2017-05-06 DIAGNOSIS — R739 Hyperglycemia, unspecified: Secondary | ICD-10-CM | POA: Diagnosis not present

## 2017-05-06 DIAGNOSIS — Z1151 Encounter for screening for human papillomavirus (HPV): Secondary | ICD-10-CM | POA: Diagnosis not present

## 2017-05-06 DIAGNOSIS — R7989 Other specified abnormal findings of blood chemistry: Secondary | ICD-10-CM | POA: Diagnosis not present

## 2017-05-06 DIAGNOSIS — Z01419 Encounter for gynecological examination (general) (routine) without abnormal findings: Secondary | ICD-10-CM | POA: Diagnosis not present

## 2017-05-06 DIAGNOSIS — F419 Anxiety disorder, unspecified: Secondary | ICD-10-CM | POA: Diagnosis not present

## 2017-05-06 LAB — HEPATIC FUNCTION PANEL
ALBUMIN: 3.9 g/dL (ref 3.5–5.2)
ALK PHOS: 58 U/L (ref 39–117)
ALT: 13 U/L (ref 0–35)
AST: 12 U/L (ref 0–37)
Bilirubin, Direct: 0.1 mg/dL (ref 0.0–0.3)
Total Bilirubin: 0.3 mg/dL (ref 0.2–1.2)
Total Protein: 7 g/dL (ref 6.0–8.3)

## 2017-05-06 LAB — VITAMIN D 25 HYDROXY (VIT D DEFICIENCY, FRACTURES): VITD: 41.41 ng/mL (ref 30.00–100.00)

## 2017-05-06 LAB — BASIC METABOLIC PANEL
BUN: 8 mg/dL (ref 6–23)
CALCIUM: 9.7 mg/dL (ref 8.4–10.5)
CO2: 27 meq/L (ref 19–32)
CREATININE: 0.69 mg/dL (ref 0.40–1.20)
Chloride: 106 mEq/L (ref 96–112)
GFR: 99.95 mL/min (ref >60.00)
Glucose, Bld: 107 mg/dL — ABNORMAL HIGH (ref 70–99)
Potassium: 4.3 mEq/L (ref 3.5–5.1)
SODIUM: 140 meq/L (ref 135–145)

## 2017-05-06 LAB — CBC WITH DIFFERENTIAL/PLATELET
Basophils Absolute: 0 10*3/uL (ref 0.0–0.1)
Basophils Relative: 0.5 % (ref 0.0–3.0)
EOS ABS: 0.3 10*3/uL (ref 0.0–0.7)
EOS PCT: 4.2 % (ref 0.0–5.0)
HCT: 39.6 % (ref 36.0–46.0)
HEMOGLOBIN: 13.1 g/dL (ref 12.0–15.0)
LYMPHS ABS: 2.3 10*3/uL (ref 0.7–4.0)
Lymphocytes Relative: 33.8 % (ref 12.0–46.0)
MCHC: 33.1 g/dL (ref 30.0–36.0)
MCV: 86.1 fl (ref 78.0–100.0)
MONO ABS: 0.5 10*3/uL (ref 0.1–1.0)
Monocytes Relative: 7.8 % (ref 3.0–12.0)
NEUTROS PCT: 53.7 % (ref 43.0–77.0)
Neutro Abs: 3.6 10*3/uL (ref 1.4–7.7)
Platelets: 339 10*3/uL (ref 150.0–400.0)
RBC: 4.6 Mil/uL (ref 3.87–5.11)
RDW: 13.6 % (ref 11.5–15.5)
WBC: 6.8 10*3/uL (ref 4.0–10.5)

## 2017-05-06 LAB — FOLATE

## 2017-05-06 LAB — TSH: TSH: 1.45 u[IU]/mL (ref 0.35–4.50)

## 2017-05-06 LAB — VITAMIN B12: Vitamin B-12: 846 pg/mL (ref 211–911)

## 2017-05-06 LAB — LIPID PANEL
Cholesterol: 197 mg/dL (ref 0–200)
HDL: 48.2 mg/dL (ref >39.00)
NonHDL: 148.97
Total CHOL/HDL Ratio: 4
Triglycerides: 208 mg/dL — ABNORMAL HIGH (ref 0.0–149.0)
VLDL: 41.6 mg/dL — ABNORMAL HIGH (ref 0.0–40.0)

## 2017-05-06 LAB — HEMOGLOBIN A1C: HEMOGLOBIN A1C: 5.8 % (ref 4.6–6.5)

## 2017-05-06 LAB — LDL CHOLESTEROL, DIRECT: Direct LDL: 114 mg/dL

## 2017-05-06 MED ORDER — ETONOGESTREL-ETHINYL ESTRADIOL 0.12-0.015 MG/24HR VA RING: VAGINAL_RING | VAGINAL | 4 refills | Status: DC

## 2017-05-06 MED ORDER — MONTELUKAST SODIUM 10 MG PO TABS: 10.0000 mg | ORAL_TABLET | Freq: Every day | ORAL | 3 refills | Status: DC

## 2017-05-06 MED ORDER — ESOMEPRAZOLE MAGNESIUM 40 MG PO CPDR: 40.0000 mg | DELAYED_RELEASE_CAPSULE | Freq: Every day | ORAL | 3 refills | Status: DC

## 2017-05-06 NOTE — Patient Instructions (Signed)
1. Encounter for routine gynecological examination with Papanicolaou smear of cervix Normal Gyn exam.  Pap/HPV done.  Breast exam normal.  Repeat Screening mammo 10/2017 [facility].  2. Encounter for surveillance of vaginal ring hormonal contraceptive device Nuvaring continuous use represcribed.  3. Obesity (BMI 30-39.9) BMI 30.27.  Lost 34 Lbs x last year.  Continued low carb diet/Regular physical activity recommended.  4. Anxiety Followed by Dr Casimiro Needle.  Appointment scheduled tomorrow.   April [name], it was a pleasure to see you today!  I will inform you of your result as soon as available.

## 2017-05-06 NOTE — Patient Instructions (Signed)

## 2017-05-06 NOTE — Assessment & Plan Note (Signed)
stable overall by history and exam, recent data reviewed with pt, and pt to continue medical treatment as before,  to f/u any worsening symptoms or concerns Lab Results  Component Value Date   HGBA1C 5.6 05/03/2016  For f/u today

## 2017-05-06 NOTE — Progress Notes (Signed)
Subjective:    [name], female    DOB: [birth year], 41 y.o.   MRN: [id number]  HPI  Here for wellness and f/u;  Overall doing ok;  Pt denies Chest pain, worsening SOB, DOE, wheezing, orthopnea, PND, worsening LE edema, palpitations, dizziness or syncope.  Pt denies neurological change such as new headache, facial or extremity weakness.  Pt denies polydipsia, polyuria, or low sugar symptoms. Pt states overall good compliance with treatment and medications, good tolerability, and has been trying to follow appropriate diet. No fever, night sweats, wt loss, loss of appetite, or other constitutional symptoms.  Pt states good ability with ADL's, has low fall risk, home safety reviewed and adequate, no other significant changes in hearing or vision, and not very active with exercise. S/p pap earlier today.  Had mamogram in Dec 2017.  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  Seeing herbal counselor "my adrenals have really been messed up".  Seeing counseling and psychiatry/Dr Plovsky for increased anxiety and depression, though still has some panic in last 6 mo.  Also has lost 35-40 lbs in last few months, has stopped the fenofibrate and statin since jan issue with levaquin.Asks for Vit d, folic acid, W41 added to labs today Past Medical History:  Diagnosis Date  . ALLERGIC RHINITIS   . ANXIETY DEPRESSION   . CONSTIPATION   . DEPRESSION   . Esophageal reflux   . HYPERCHOLESTEROLEMIA   . OBESITY   . Occipital neuralgia   . Palpitations    Past Surgical History:  Procedure Laterality Date  . NO PAST SURGERIES      reports that she has never smoked. She has never used smokeless tobacco. She reports that she does not drink alcohol or use drugs. family history includes Breast cancer (age of onset: 77) in her mother; Coronary artery disease in her father; Diabetes in her mother; Hyperlipidemia in her brother, father, and mother; Hypertension in her mother;  Osteoarthritis in her mother. Allergies  Allergen Reactions  . Ciprofloxacin     toxicity  . Levaquin [Levofloxacin] Anxiety    Possible CNS side effects. TOXICITY  . Moxifloxacin     TOXICITY   Current Outpatient Prescriptions on File Prior to Visit  Medication Sig Dispense Refill  . Ashwagandha 500 MG CAPS Take 600 mg by mouth.    . busPIRone (BUSPAR) 15 MG tablet 1  qam   2  q noon 90 tablet 5  . cetirizine (ZYRTEC) 10 MG tablet Take 1 tablet (10 mg total) by mouth daily. 30 tablet 3  . cholecalciferol (VITAMIN D) 1000 units tablet Take 5,000 Units by mouth daily.    . Coenzyme Q10 (COQ-10) 200 MG CAPS Take by mouth.    Lovell Sheehan Thistle (LIVER & KIDNEY CLEANSER PO) Take by mouth 3 (three) times daily.    Marland Kitchen gabapentin (NEURONTIN) 800 MG tablet Take 900 mg by mouth at bedtime.    Marland Kitchen L-Theanine 100 MG CAPS Take 200 mg by mouth 2 (two) times daily.    . meclizine (ANTIVERT) 25 MG tablet     . Multiple Vitamin (MULTIVITAMIN) tablet Take 1 tablet by mouth daily.      . Multiple Vitamins-Iron (CHLORELLA PO) Take 1 capsule by mouth 2 (two) times daily.    . Omega-3 Fatty Acids (FISH OIL) 1000 MG CAPS Take 1,000 mg by mouth 2 (two) times daily.    . Probiotic Product (SOLUBLE FIBER/PROBIOTICS PO) Take 2 capsules by  mouth 2 (two) times daily.    . propranolol (INDERAL) 10 MG tablet 1 bid 60 tablet 5  . vitamin C (ASCORBIC ACID) 500 MG tablet Take 500 mg by mouth daily.      . vitamin E 400 UNIT capsule Take 400 Units by mouth daily.     No current facility-administered medications on file prior to visit.    Review of Systems Constitutional: Negative for other unusual diaphoresis, sweats, appetite or weight changes HENT: Negative for other worsening hearing loss, ear pain, facial swelling, mouth sores or neck stiffness.   Eyes: Negative for other worsening pain, redness or other visual disturbance.  Respiratory: Negative for other stridor or swelling Cardiovascular: Negative for  other palpitations or other chest pain  Gastrointestinal: Negative for worsening diarrhea or loose stools, blood in stool, distention or other pain Genitourinary: Negative for hematuria, flank pain or other change in urine volume.  Musculoskeletal: Negative for myalgias or other joint swelling.  Skin: Negative for other color change, or other wound or worsening drainage.  Neurological: Negative for other syncope or numbness. Hematological: Negative for other adenopathy or swelling Psychiatric/Behavioral: Negative for hallucinations, other worsening agitation, SI, self-injury, or new decreased concentration All other system neg per pt    Objective:   Physical Exam BP 120/72   Pulse 70   Ht 5' 10.5" (1.791 m)   Wt 215 lb (97.5 kg)   PF 99 L/min   BMI 30.41 kg/m  VS noted,  Constitutional: Pt is oriented to person, place, and time. Appears well-developed and well-nourished, in no significant distress and comfortable Head: Normocephalic and atraumatic  Eyes: Conjunctivae and EOM are normal. Pupils are equal, round, and reactive to light Right Ear: External ear normal without discharge Left Ear: External ear normal without discharge Nose: Nose without discharge or deformity Mouth/Throat: Oropharynx is without other ulcerations and moist  Neck: Normal range of motion. Neck supple. No JVD present. No tracheal deviation present or significant neck LA or mass Cardiovascular: Normal rate, regular rhythm, normal heart sounds and intact distal pulses.   Pulmonary/Chest: WOB normal and breath sounds without rales or wheezing  Abdominal: Soft. Bowel sounds are normal. NT. No HSM  Musculoskeletal: Normal range of motion. Exhibits no edema Lymphadenopathy: Has no other cervical adenopathy.  Neurological: Pt is alert and oriented to person, place, and time. Pt has normal reflexes. No cranial nerve deficit. Motor grossly intact, Gait intact Skin: Skin is warm and dry. No rash noted or new  ulcerations Psychiatric:  Has normal mood and affect. Behavior is normal without agitation No other exam findings Lab Results  Component Value Date   WBC 14.8 (H) 12/20/2016   HGB 13.3 12/20/2016   HCT 39.9 12/20/2016   PLT 342 12/20/2016   GLUCOSE 112 (H) 12/20/2016   CHOL 152 05/03/2016   TRIG 147.0 05/03/2016   HDL 54.50 05/03/2016   LDLDIRECT 82.1 12/06/2009   LDLCALC 69 05/03/2016   ALT 12 05/03/2016   AST 12 05/03/2016   NA 138 12/20/2016   K 3.7 12/20/2016   CL 107 12/20/2016   CREATININE 0.85 12/20/2016   BUN 12 12/20/2016   CO2 23 12/20/2016   TSH 1.20 05/03/2016   HGBA1C 5.6 05/03/2016       Assessment & Plan:

## 2017-05-06 NOTE — Assessment & Plan Note (Signed)

## 2017-05-06 NOTE — Progress Notes (Signed)
April [name] 07-15-1976 [id number]   History:    41 y.o. G0  Married  RP:  Established patient presenting for annual gyn exam   HPI:  Well on Nuvaring continuously.  No vaginal bleeding.  No pelvic pain.  Normal vaginal secretions.  Breasts wnl.  Had a Dx Lt Mammo/US for a Lt inverted nipple 09/2016 and a repeat Lt breast US recently which was benign.  Screening Mammo 10/2016. 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  Past medical history,surgical history, family history and social history were all reviewed and documented in the EPIC chart.  Gynecologic History No LMP recorded. Patient is not currently having periods (Reason: Other). Contraception: NuvaRing vaginal inserts Last Pap: 2016. Results were: normal Last mammogram: 10/2016. Results were: Benign.    Obstetric History OB History  Gravida Para Term Preterm AB Living  0 0 0 0 0 0  SAB TAB Ectopic Multiple Live Births  0 0 0 0 0         ROS: A ROS was performed and pertinent positives and negatives are included in the history.  GENERAL: No fevers or chills. HEENT: No change in vision, no earache, sore throat or sinus congestion. NECK: No pain or stiffness. CARDIOVASCULAR: No chest pain or pressure. No palpitations. PULMONARY: No shortness of breath, cough or wheeze. GASTROINTESTINAL: No abdominal pain, nausea, vomiting or diarrhea, melena or bright red blood per rectum. GENITOURINARY: No urinary frequency, urgency, hesitancy or dysuria. MUSCULOSKELETAL: No joint or muscle pain, no back pain, no recent trauma. DERMATOLOGIC: No rash, no itching, no lesions. ENDOCRINE: No polyuria, polydipsia, no heat or cold intolerance. No recent change in weight. HEMATOLOGICAL: No anemia or easy bruising or bleeding. NEUROLOGIC: No headache, seizures, numbness, tingling or weakness. PSYCHIATRIC: No depression, no loss of interest in normal activity or  change in sleep pattern.     Exam:   BP 128/70   Ht 5' 10.5" (1.791 m)   Wt 214 lb (97.1 kg)   BMI 30.27 kg/m   Body mass index is 30.27 kg/m.  General appearance : Well developed well nourished female. No acute distress HEENT: Eyes: no retinal hemorrhage or exudates,  Neck supple, trachea midline, no carotid bruits, no thyroidmegaly Lungs: Clear to auscultation, no rhonchi or wheezes, or rib retractions  Heart: Regular rate and rhythm, no murmurs or gallops Breast:Examined in sitting and supine position were symmetrical in appearance, no palpable masses or tenderness,  no skin retraction, no nipple inversion, no nipple discharge, no skin discoloration, no axillary or supraclavicular lymphadenopathy Abdomen: no palpable masses or tenderness, no rebound or guarding Extremities: no edema or skin discoloration or tenderness  Pelvic:  Bartholin, Urethra, Skene Glands: Within normal limits             Vagina: No gross lesions or discharge  Cervix: No gross lesions or discharge.  Pap/HPV done.  Uterus  RV, normal size, shape and consistency, non-tender and mobile  Adnexa  Without masses or tenderness  Anus and perineum  normal    Assessment/Plan:  41 y.o. female for annual exam   1. Encounter for routine gynecological examination with Papanicolaou smear of cervix Normal Gyn exam.  Pap/HPV done.  Breast exam normal.  Repeat Screening mammo 10/2017 [facility].  2. Encounter for surveillance of vaginal ring hormonal contraceptive device Nuvaring continuous use represcribed.  3. Obesity (BMI 30-39.9) BMI 30.27.  Lost 34 Lbs x last  year.  Continue low carb diet/Regular physical activity recommended.  4. Anxiety Followed by Dr Casimiro Needle.  Appointment scheduled tomorrow.  Counseling >50% x 10 minutes on above issues.  Princess Bruins MD, 9:41 AM 05/06/2017

## 2017-05-07 ENCOUNTER — Encounter (HOSPITAL_COMMUNITY): Payer: Self-pay | Admitting: Psychiatry

## 2017-05-07 ENCOUNTER — Ambulatory Visit (INDEPENDENT_AMBULATORY_CARE_PROVIDER_SITE_OTHER): Payer: 59 | Admitting: Psychiatry

## 2017-05-07 VITALS — BP 130/78 | HR 72 | Ht 70.5 in | Wt 214.0 lb

## 2017-05-07 DIAGNOSIS — Z7189 Other specified counseling: Secondary | ICD-10-CM | POA: Diagnosis not present

## 2017-05-07 DIAGNOSIS — F320 Major depressive disorder, single episode, mild: Secondary | ICD-10-CM

## 2017-05-07 LAB — HIV ANTIBODY (ROUTINE TESTING W REFLEX): HIV: NONREACTIVE

## 2017-05-07 MED ORDER — ESCITALOPRAM OXALATE 20 MG PO TABS: 20.0000 mg | ORAL_TABLET | Freq: Every day | ORAL | 1 refills | Status: DC

## 2017-05-07 MED ORDER — ZOLPIDEM TARTRATE 5 MG PO TABS: 5.0000 mg | ORAL_TABLET | Freq: Every evening | ORAL | 2 refills | Status: DC | PRN

## 2017-05-07 NOTE — Progress Notes (Signed)
[facility] MD Progress Note  05/07/2017 4:46 PM April [name]  MRN:  [id number] Subjective: Doing great Principal Problem: Major depression, recurrent, remission Diagnosis:  Major depression, recurrent, remission  Today the patient is much better. Her mood is up. Her nausea is improved. She is much less side. She's not crying as frequently at all. She is a great husband. Patient able to think and concentrate. She's able to read watch TV. She has a dog and a cat she enjoys. The patient overall is functioning much better. She works regularly. It turns out that we'll increase the Trintelix reasons that are not clear she felt much worse. Shears self tapered herself off of this medicine and put herself back on Lexapro. She's now on 20 mg and she feels much improved. She also reduced her BuSpar to just taking 15 mg pill. Today she is requesting to stop any the Klonopin at night. She was able take 1 mg but it did not help all that much. Today we'll go back to Ambien 5 mg for sleep. For now she'll continue the propranolol. Patient's mood is much improved. Patient Active Problem List   Diagnosis Date Noted  . Left otitis media [H66.92] 01/07/2017  . Eustachian tube dysfunction [H69.80] 01/07/2017  . Chronic sinusitis [J32.9] 01/07/2017  . N&V (nausea and vomiting) [R11.2] 12/31/2016  . Vertigo [R42] 12/31/2016  . 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 Nonallopathic lesion of thoracic region [M99.9] 06/03/2016  . Nonallopathic lesion-rib cage [M99.9] 06/03/2016  . Nonallopathic lesion of cervical region [M99.9] 06/03/2016  . Rash [R21] 05/22/2016  . Acute left-sided thoracic back pain [M54.6] 05/22/2016  . Hyperglycemia [R73.9] 05/03/2016  . Preventative health care [Z00.00] 05/04/2015  . Left hamstring injury [S76.302A] 01/24/2015  . Major depressive disorder, recurrent episode, moderate ([place]) [F33.1] 12/02/2014  . Occipital neuralgia [M54.81]   . ANEMIA-NOS [D64.9] 12/11/2009  . Hyperlipidemia  [E78.5] 11/16/2008  . Obesity [E66.9] 11/16/2008  . ANXIETY DEPRESSION [F34.1] 11/16/2008  . Allergic rhinitis [J30.9] 11/16/2008  . Esophageal reflux [K21.9] 11/16/2008  . Constipation [K59.00] 11/16/2008  . Palpitations [R00.2] 11/16/2008   Total Time spent with patient: 30 minutes  Past Psychiatric History:   Past Medical History:  Past Medical History:  Diagnosis Date  . ALLERGIC RHINITIS   . ANXIETY DEPRESSION   . CONSTIPATION   . DEPRESSION   . Esophageal reflux   . HYPERCHOLESTEROLEMIA   . OBESITY   . Occipital neuralgia   . Palpitations     Past Surgical History:  Procedure Laterality Date  . NO PAST SURGERIES     Family History:  Family History  Problem Relation Age of Onset  . Hypertension Mother   . Diabetes Mother   . Hyperlipidemia Mother   . Breast cancer Mother 67  . Osteoarthritis Mother   . Hyperlipidemia Father   . Coronary artery disease Father        MI age 41  . Hyperlipidemia Brother    Family Psychiatric  History:  Social History:  History  Alcohol Use No     History  Drug Use No    Social History   Social History  . Marital status: Married    Spouse name: N/A  . Number of children: N/A  . Years of education: N/A   Social History Main Topics  . Smoking status: Never Smoker  . Smokeless tobacco: Never Used     Comment: Married, lives with spouse  . Alcohol use No  . Drug use: No  .  Sexual activity: Yes    Partners: Male    Birth control/ protection: Inserts     Comment: 1ST INTERCOURSE- 21, PARTNERS- 3    Other Topics Concern  . None   Social History Narrative  . None   Additional Social History:                         Sleep: Good  Appetite:  Good  Current Medications: Current Outpatient Prescriptions  Medication Sig Dispense Refill  . Ashwagandha 500 MG CAPS Take 600 mg by mouth.    . busPIRone (BUSPAR) 15 MG tablet 1  qam   2  q noon 90 tablet 5  . cetirizine (ZYRTEC) 10 MG tablet Take 1 tablet  (10 mg total) by mouth daily. 30 tablet 3  . cholecalciferol (VITAMIN D) 1000 units tablet Take 5,000 Units by mouth daily.    . clonazePAM (KLONOPIN) 1 MG tablet Take 1 mg by mouth daily as needed for anxiety (take half a tablet as needed).    . Coenzyme Q10 (COQ-10) 200 MG CAPS Take by mouth.    Lovell Sheehan Thistle (LIVER & KIDNEY CLEANSER PO) Take by mouth 3 (three) times daily.    Marland Kitchen doxylamine, Sleep, (UNISOM) 25 MG tablet Take 25 mg by mouth at bedtime as needed. Taka half a tablet as needed    . escitalopram (LEXAPRO) 20 MG tablet Take 20 mg by mouth daily.    Marland Kitchen esomeprazole (NEXIUM) 40 MG capsule Take 1 capsule (40 mg total) by mouth daily. 90 capsule 3  . etonogestrel-ethinyl estradiol (NUVARING) 0.12-0.015 MG/24HR vaginal ring Continuous use.  Remove and insert new ring every 4 weeks. 3 each 4  . gabapentin (NEURONTIN) 800 MG tablet Take 900 mg by mouth at bedtime.    Marland Kitchen L-Theanine 100 MG CAPS Take 200 mg by mouth 2 (two) times daily.    . meclizine (ANTIVERT) 25 MG tablet     . montelukast (SINGULAIR) 10 MG tablet Take 1 tablet (10 mg total) by mouth at bedtime. 90 tablet 3  . Multiple Vitamin (MULTIVITAMIN) tablet Take 1 tablet by mouth daily.      . Multiple Vitamins-Iron (CHLORELLA PO) Take 1 capsule by mouth 2 (two) times daily.    . Omega-3 Fatty Acids (FISH OIL) 1000 MG CAPS Take 1,000 mg by mouth 2 (two) times daily.    . Probiotic Product (SOLUBLE FIBER/PROBIOTICS PO) Take 2 capsules by mouth 2 (two) times daily.    . propranolol (INDERAL) 10 MG tablet 1 bid 60 tablet 5  . vitamin C (ASCORBIC ACID) 500 MG tablet Take 500 mg by mouth daily.      . vitamin E 400 UNIT capsule Take 400 Units by mouth daily.    Marland Kitchen zolpidem (AMBIEN) 5 MG tablet Take 1 tablet (5 mg total) by mouth at bedtime as needed for sleep. 30 tablet 2  . escitalopram (LEXAPRO) 20 MG tablet Take 1 tablet (20 mg total) by mouth daily. 90 tablet 1   No current facility-administered medications for this visit.      Lab Results:  Results for orders placed or performed in visit on 05/06/17 (from the past 48 hour(s))  Hemoglobin A1c     Status: None   Collection Time: 05/06/17  4:22 PM  Result Value Ref Range   Hgb A1c MFr Bld 5.8 4.6 - 6.5 %    Comment: Glycemic Control Guidelines for People with Diabetes:Non Diabetic:  <6%Goal of Therapy: <7%Additional  Action Suggested:  >3%   Basic metabolic panel     Status: Abnormal   Collection Time: 05/06/17  4:22 PM  Result Value Ref Range   Sodium 140 135 - 145 mEq/L   Potassium 4.3 3.5 - 5.1 mEq/L   Chloride 106 96 - 112 mEq/L   CO2 27 19 - 32 mEq/L   Glucose, Bld 107 (H) 70 - 99 mg/dL   BUN 8 6 - 23 mg/dL   Creatinine, Ser 0.69 0.40 - 1.20 mg/dL   Calcium 9.7 8.4 - 10.5 mg/dL   GFR 99.95 >60.00 mL/min  Lipid panel     Status: Abnormal   Collection Time: 05/06/17  4:22 PM  Result Value Ref Range   Cholesterol 197 0 - 200 mg/dL    Comment: ATP III Classification       Desirable:  < 200 mg/dL               Borderline High:  200 - 239 mg/dL          High:  > = 240 mg/dL   Triglycerides 208.0 (H) 0.0 - 149.0 mg/dL    Comment: Normal:  <150 mg/dLBorderline High:  150 - 199 mg/dL   HDL 48.20 >39.00 mg/dL   VLDL 41.6 (H) 0.0 - 40.0 mg/dL   Total CHOL/HDL Ratio 4     Comment:                Men          Women1/2 Average Risk     3.4          3.3Average Risk          5.0          4.42X Average Risk          9.6          7.13X Average Risk          15.0          11.0                       NonHDL 148.97     Comment: NOTE:  Non-HDL goal should be 30 mg/dL higher than patient's LDL goal (i.e. LDL goal of < 70 mg/dL, would have non-HDL goal of < 100 mg/dL)  Hepatic function panel     Status: None   Collection Time: 05/06/17  4:22 PM  Result Value Ref Range   Total Bilirubin 0.3 0.2 - 1.2 mg/dL   Bilirubin, Direct 0.1 0.0 - 0.3 mg/dL   Alkaline Phosphatase 58 39 - 117 U/L   AST 12 0 - 37 U/L   ALT 13 0 - 35 U/L   Total Protein 7.0 6.0 - 8.3 g/dL   Albumin  3.9 3.5 - 5.2 g/dL  CBC with Differential/Platelet     Status: None   Collection Time: 05/06/17  4:22 PM  Result Value Ref Range   WBC 6.8 4.0 - 10.5 K/uL   RBC 4.60 3.87 - 5.11 Mil/uL   Hemoglobin 13.1 12.0 - 15.0 g/dL   HCT 39.6 36.0 - 46.0 %   MCV 86.1 78.0 - 100.0 fl   MCHC 33.1 30.0 - 36.0 g/dL   RDW 13.6 11.5 - 15.5 %   Platelets 339.0 150.0 - 400.0 K/uL   Neutrophils Relative % 53.7 43.0 - 77.0 %   Lymphocytes Relative 33.8 12.0 - 46.0 %   Monocytes Relative 7.8 3.0 - 12.0 %  Eosinophils Relative 4.2 0.0 - 5.0 %   Basophils Relative 0.5 0.0 - 3.0 %   Neutro Abs 3.6 1.4 - 7.7 K/uL   Lymphs Abs 2.3 0.7 - 4.0 K/uL   Monocytes Absolute 0.5 0.1 - 1.0 K/uL   Eosinophils Absolute 0.3 0.0 - 0.7 K/uL   Basophils Absolute 0.0 0.0 - 0.1 K/uL  TSH     Status: None   Collection Time: 05/06/17  4:22 PM  Result Value Ref Range   TSH 1.45 0.35 - 4.50 uIU/mL  HIV antibody     Status: None   Collection Time: 05/06/17  4:22 PM  Result Value Ref Range   HIV 1&2 Ab, 4th Generation NONREACTIVE NONREACTIVE    Comment:   HIV-1 antigen and HIV-1/HIV-2 antibodies were not detected.  There is no laboratory evidence of HIV infection.   HIV-1/2 Antibody Diff        Not indicated. HIV-1 RNA, Qual TMA          Not indicated.     PLEASE NOTE: This information has been disclosed to you from records whose confidentiality may be protected by state law. If your state requires such protection, then the state law prohibits you from making any further disclosure of the information without the specific written consent of the person to whom it pertains, or as otherwise permitted by law. A general authorization for the release of medical or other information is NOT sufficient for this purpose.   The performance of this assay has not been clinically validated in patients less than 26 years old.   For additional information please refer to http://education.[facility].com/faq/FAQ106.  (This link is  being provided for informational/educational purposes only.)     Vitamin B12     Status: None   Collection Time: 05/06/17  4:22 PM  Result Value Ref Range   Vitamin B-12 846 211 - 911 pg/mL  Folate     Status: None   Collection Time: 05/06/17  4:22 PM  Result Value Ref Range   Folate >24.0 >5.9 ng/mL  VITAMIN D 25 Hydroxy (Vit-D Deficiency, Fractures)     Status: None   Collection Time: 05/06/17  4:22 PM  Result Value Ref Range   VITD 41.41 30.00 - 100.00 ng/mL  LDL cholesterol, direct     Status: None   Collection Time: 05/06/17  4:22 PM  Result Value Ref Range   Direct LDL 114.0 mg/dL    Comment: Optimal:  <100 mg/dLNear or Above Optimal:  100-129 mg/dLBorderline High:  130-159 mg/dLHigh:  160-189 mg/dLVery High:  >190 mg/dL    Blood Alcohol level:  No results found for: [facility]  Physical Findings: AIMS:  , ,  ,  ,    CIWA:    COWS:     Musculoskeletal: Strength & Muscle Tone: within normal limits Gait & Station: normal Patient leans: N/A  Psychiatric Specialty Exam: ROS  Blood pressure 130/78, pulse 72, height 5' 10.5" (1.791 m), weight 214 lb (97.1 kg).Body mass index is 30.27 kg/m.  General Appearance: Casual  Eye Contact::  Good  Speech:  Clear and Coherent  Volume:  Normal  Mood:  Euthymic  Affect:  Appropriate  Thought Process:  Coherent  Orientation:  Full (Time, Place, and Person)  Thought Content:  WDL  Suicidal Thoughts:  No  Homicidal Thoughts:  No  Memory:  NA  Judgement:  Good  Insight:  Good  Psychomotor Activity:  Normal  Concentration:  Good  Recall:  Good  Fund of Knowledge:Good  Language: Good  Akathisia:  No  Handed:  Right  AIMS (if indicated):     Assets:  Desire for Improvement  ADL's:  Intact  Cognition: WNL  Sleep:      Treatment Plan Summary:  At this time the patient will continue taking Lexapro 20 mg. She'll continue the BuSpar 15 mg. She'll continue the propranolol 10 mg twice a day which I suspect could be for anxiety or  for tremor. In her next visit in 2 months we'll discontinue her propranolol. She'll continue taking the low dose of BuSpar and full dose of Lexapro 20 mg here forward ever reason she is feeling better. She drinks no alcohol uses no drugs. Very well at this time.

## 2017-05-08 MED FILL — NUVARING VAGINAL RING: 0.12-0.015 | 84 days supply | Qty: 3 | Fill #0

## 2017-05-08 MED FILL — ZOLPIDEM TARTRATE 5 MG TAB: 5 | 30 days supply | Qty: 30 | Fill #0

## 2017-05-09 ENCOUNTER — Ambulatory Visit: Payer: 59 | Admitting: Psychology

## 2017-05-09 LAB — PAP, TP IMAGING W/ HPV RNA, RFLX HPV TYPE 16,18/45: HPV mRNA, High Risk: NOT DETECTED

## 2017-05-14 DIAGNOSIS — R197 Diarrhea, unspecified: Secondary | ICD-10-CM | POA: Diagnosis not present

## 2017-05-14 DIAGNOSIS — R195 Other fecal abnormalities: Secondary | ICD-10-CM | POA: Diagnosis not present

## 2017-05-14 DIAGNOSIS — R109 Unspecified abdominal pain: Secondary | ICD-10-CM | POA: Diagnosis not present

## 2017-05-14 DIAGNOSIS — R110 Nausea: Secondary | ICD-10-CM | POA: Diagnosis not present

## 2017-06-04 ENCOUNTER — Ambulatory Visit (INDEPENDENT_AMBULATORY_CARE_PROVIDER_SITE_OTHER): Payer: 59 | Admitting: Psychology

## 2017-06-04 DIAGNOSIS — F4323 Adjustment disorder with mixed anxiety and depressed mood: Secondary | ICD-10-CM

## 2017-06-12 MED FILL — busPIRone HCL 15 MG TABS: 15 | 60 days supply | Qty: 60 | Fill #1

## 2017-06-12 MED FILL — PROPRANOLOL 10 MG TABLET: 10 | 30 days supply | Qty: 60 | Fill #2

## 2017-06-12 MED FILL — GABAPENTIN 600 MG TABLET: 600 | 30 days supply | Qty: 90 | Fill #2

## 2017-06-12 MED FILL — ZOLPIDEM TARTRATE 5 MG TAB: 5 | 30 days supply | Qty: 30 | Fill #1

## 2017-07-23 ENCOUNTER — Ambulatory Visit: Payer: 59 | Admitting: Psychology

## 2017-07-29 MED FILL — ESCITALOPRAM 20 MG TABLET: 20 | 90 days supply | Qty: 90 | Fill #0

## 2017-07-29 MED FILL — ZOLPIDEM TARTRATE 5 MG TAB: 5 | 30 days supply | Qty: 30 | Fill #2

## 2017-07-29 MED FILL — PROPRANOLOL 10 MG TABLET: 10 | 30 days supply | Qty: 60 | Fill #3

## 2017-07-29 MED FILL — MONTELUKAST SOD 10 MG TAB: 10 | 90 days supply | Qty: 90 | Fill #0

## 2017-07-30 MED FILL — busPIRone HCL 15 MG TABS: 15 | 60 days supply | Qty: 60 | Fill #2

## 2017-08-05 MED FILL — NUVARING VAGINAL RING: 0.12-0.015 | 84 days supply | Qty: 3 | Fill #1

## 2017-08-14 ENCOUNTER — Encounter: Payer: Self-pay | Admitting: Family Medicine

## 2017-08-14 ENCOUNTER — Ambulatory Visit (INDEPENDENT_AMBULATORY_CARE_PROVIDER_SITE_OTHER): Payer: 59 | Admitting: Family Medicine

## 2017-08-14 VITALS — BP 130/70 | HR 79 | Ht 70.0 in | Wt 226.0 lb

## 2017-08-14 DIAGNOSIS — R293 Abnormal posture: Secondary | ICD-10-CM | POA: Diagnosis not present

## 2017-08-14 DIAGNOSIS — M999 Biomechanical lesion, unspecified: Secondary | ICD-10-CM

## 2017-08-14 NOTE — Assessment & Plan Note (Signed)
Decision today to treat with OMT was based on Physical Exam  After verbal consent patient was treated with HVLA, ME, FPR techniques in cervical, thoracic, rib, lumbar and sacral areas  Patient tolerated the procedure well with improvement in symptoms  Patient given exercises, stretches and lifestyle modifications  See medications in patient instructions if given  Patient will follow up in 4-6 weeks 

## 2017-08-14 NOTE — Assessment & Plan Note (Signed)
4 posture noted. Discussed icing regimen and home exercises. We discussed which activities doing which ones to avoid. Patient is to increase activity slowly. Work on posture on a more regular basis. Patient will follow-up with me again in 4-6 weeks.

## 2017-08-14 NOTE — Patient Instructions (Signed)
Good to see you  Ice 20 minutes 2 times daily. Usually after activity and before bed. Posture posture posture On wall with heels, butt shoulder and head touching for a goal of 5 minutes daily  Tennis ball between shoulder blades with sitting.  Turmeric 500mg  twice daily  See me again in 4-6 weeks

## 2017-08-14 NOTE — Progress Notes (Signed)
[name] [address] [place], [place] [address] Phone: [phone] Subjective:     CC: Headache and neck pain   [id number]  April [name] is a 41 y.o. female coming in with complaint of neck/ shoulder pain. The patient explains the pain around their clavicle started 2 months ago. The neck pain started about a month ago. The pain in the posterior of her shoulder started about 2 weeks. She noticed that when she sneezes and coughs that the posterior right side of her shoulder hurts. She says it feels like her "slipped rib" on the left side. The neck pain is the worse. Her clavicle is TTP. Nothing helps her clavicle pain.   Onset- worsening symptoms over the course last several weeks Location- lateral neck  Duration- Neck pain usually the worse at night & in the morning  Character- "feels like a pulled muscle" Aggravating factors- movement Reliving factors- Ice Therapies tried- Tumeric, topical creams  Severity- At its worse 6. Right now 4      Past Medical History:  Diagnosis Date  . ALLERGIC RHINITIS   . ANXIETY DEPRESSION   . CONSTIPATION   . DEPRESSION   . Esophageal reflux   . HYPERCHOLESTEROLEMIA   . OBESITY   . Occipital neuralgia   . Palpitations    Past Surgical History:  Procedure Laterality Date  . NO PAST SURGERIES     Social History   Social History  . Marital status: Married    Spouse name: N/A  . Number of children: N/A  . Years of education: N/A   Social History Main Topics  . Smoking status: Never Smoker  . Smokeless tobacco: Never Used     Comment: Married, lives with spouse  . Alcohol use No  . Drug use: No  . Sexual activity: Yes    Partners: Male    Birth control/ protection: Inserts     Comment: 1ST INTERCOURSE- 21, PARTNERS- 3    Other Topics Concern  . None   Social History Narrative  . None   Allergies  Allergen Reactions  . Ciprofloxacin     toxicity  . Levaquin [Levofloxacin] Anxiety     Possible CNS side effects. TOXICITY  . Moxifloxacin     TOXICITY   Family History  Problem Relation Age of Onset  . Hypertension Mother   . Diabetes Mother   . Hyperlipidemia Mother   . Breast cancer Mother 6  . Osteoarthritis Mother   . Hyperlipidemia Father   . Coronary artery disease Father        MI age 59  . Hyperlipidemia Brother      Past medical history, social, surgical and family history all reviewed in electronic medical record.  No pertanent information unless stated regarding to the chief complaint.   Review of Systems:Review of systems updated and as accurate as of 08/14/17  No headache, visual changes, nausea, vomiting, diarrhea, constipation, dizziness, abdominal pain, skin rash, fevers, chills, night sweats, weight loss, swollen lymph nodes, body aches, joint swelling, muscle aches, chest pain, shortness of breath, mood changes.   Objective  Blood pressure 130/70, pulse 79, height 5\' 10"  (1.778 m), weight 226 lb (102.5 kg), SpO2 98 %. Systems examined below as of 08/14/17   General: No apparent distress alert and oriented x3 mood and affect normal, dressed appropriately.  HEENT: Pupils equal, extraocular movements intact  Respiratory: Patient's speak in full sentences and does not appear short of breath  Cardiovascular: No  lower extremity edema, non tender, no erythema  Skin: Warm dry intact with no signs of infection or rash on extremities or on axial skeleton.  Abdomen: Soft nontender  Neuro: Cranial nerves II through XII are intact, neurovascularly intact in all extremities with 2+ DTRs and 2+ pulses.  Lymph: No lymphadenopathy of posterior or anterior cervical chain or axillae bilaterally.  Gait normal with good balance and coordination.  MSK:  Non tender with full range of motion and good stability and symmetric strength and tone of shoulders, elbows, wrist, hip, knee and ankles bilaterally.  Neck: Inspection mild loss in lordosis. No palpable  stepoffs. Negative Spurling's maneuver. Full neck range of motion Grip strength and sensation normal in bilateral hands Strength good C4 to T1 distribution No sensory change to C4 to T1 Negative Hoffman sign bilaterally Reflexes normal Mild scapular dyskinesis noted  Osteopathic findings C2 flexed rotated and side bent right C5 flexed rotated and side bent left T3 extended rotated and side bent right inhaled third rib T6 extended rotated and side bent right L2 flexed rotated and side bent right Sacrum right on right     Impression and Recommendations:     This case required medical decision making of moderate complexity.      Note: This dictation was prepared with Dragon dictation along with smaller phrase technology. Any transcriptional errors that result from this process are unintentional.

## 2017-08-20 ENCOUNTER — Ambulatory Visit (HOSPITAL_COMMUNITY): Payer: Self-pay | Admitting: Psychiatry

## 2017-09-16 DIAGNOSIS — H5213 Myopia, bilateral: Secondary | ICD-10-CM | POA: Diagnosis not present

## 2017-09-16 DIAGNOSIS — H52223 Regular astigmatism, bilateral: Secondary | ICD-10-CM | POA: Diagnosis not present

## 2017-09-17 NOTE — Progress Notes (Signed)
[name] [place], [place] [address] Phone: [phone] Subjective:    I'm seeing this patient by the request  of:    CC: Neck pain, back pain  [id number]  April [name] is a 41 y.o. female coming in with complaint of neck and back pain has had significant poor posture and has responded somewhat to osteopathic manipulation. Has significant underlying anxiety and depression. Patient states that she has been doing worse than last visit. She has decreased range of motion in the head and neck and her pain has intensified. As the day progresses so does her pain. She notes that the adjustment did not help at all. She is wondering if she has some tendon issues from [place]. Patient sates that the pain is unrelenting. Affecting daily activities. Patient states and no longer responding to the over-the-counter medications. Patient also has been noncompliant with the prescription medications at this time.     Past Medical History:  Diagnosis Date  . ALLERGIC RHINITIS   . ANXIETY DEPRESSION   . CONSTIPATION   . DEPRESSION   . Esophageal reflux   . HYPERCHOLESTEROLEMIA   . OBESITY   . Occipital neuralgia   . Palpitations    Past Surgical History:  Procedure Laterality Date  . NO PAST SURGERIES     Social History   Social History  . Marital status: Married    Spouse name: N/A  . Number of children: N/A  . Years of education: N/A   Social History Main Topics  . Smoking status: Never Smoker  . Smokeless tobacco: Never Used     Comment: Married, lives with spouse  . Alcohol use No  . Drug use: No  . Sexual activity: Yes    Partners: Male    Birth control/ protection: Inserts     Comment: 1ST INTERCOURSE- 21, PARTNERS- 3    Other Topics Concern  . Not on file   Social History Narrative  . No narrative on file   Allergies  Allergen Reactions  . Ciprofloxacin     toxicity  . Levaquin [Levofloxacin] Anxiety   Possible CNS side effects. TOXICITY  . Moxifloxacin     TOXICITY   Family History  Problem Relation Age of Onset  . Hypertension Mother   . Diabetes Mother   . Hyperlipidemia Mother   . Breast cancer Mother 8  . Osteoarthritis Mother   . Hyperlipidemia Father   . Coronary artery disease Father        MI age 41  . Hyperlipidemia Brother      Past medical history, social, surgical and family history all reviewed in electronic medical record.  No pertanent information unless stated regarding to the chief complaint.   Review of Systems:Review of systems updated and as accurate as of 09/17/17  No headache, visual changes, nausea, vomiting, diarrhea, constipation, dizziness, abdominal pain, skin rash, fevers, chills, night sweats, weight loss, swollen lymph nodes, body aches, joint swelling,, chest pain, shortness of breath, mood changes. Positive muscle aches  Objective  There were no vitals taken for this visit. Systems examined below as of 09/17/17   General: No apparent distress alert and oriented x3 mood and affect normal, dressed appropriately. Patient is somewhat anxious HEENT: Pupils equal, extraocular movements intact  Respiratory: Patient's speak in full sentences and does not appear short of breath  Cardiovascular: No lower extremity edema, non tender, no erythema  Skin: Warm dry intact with no signs of infection or  rash on extremities or on axial skeleton.  Abdomen: Soft nontender  Neuro: Cranial nerves II through XII are intact, neurovascularly intact in all extremities with 2+ DTRs and 2+ pulses.  Lymph: No lymphadenopathy of posterior or anterior cervical chain or axillae bilaterally.  Gait normal with good balance and coordination.  MSK:  Diffuse tender with full range of motion and good stability and symmetric strength and tone of shoulders, elbows, wrist, hip, knee and ankles bilaterally.  Neck: Inspection loss of lordosis. No palpable stepoffs. Patient though is  very uncomfortable to even light palpation in the paraspinal musculature Positive Spurling's maneuver. Limited range of motion in all planes. Grip strength and sensation normal in bilateral hands Mild weakness in the C6 distribution on the right compared to the left side No sensory change to C4 to T1 Negative Hoffman sign bilaterally Reflexes normal    Impression and Recommendations:     This case required medical decision making of moderate complexity.      Note: This dictation was prepared with Dragon dictation along with smaller phrase technology. Any transcriptional errors that result from this process are unintentional.

## 2017-09-18 ENCOUNTER — Ambulatory Visit (INDEPENDENT_AMBULATORY_CARE_PROVIDER_SITE_OTHER): Payer: 59 | Admitting: Family Medicine

## 2017-09-18 ENCOUNTER — Other Ambulatory Visit (INDEPENDENT_AMBULATORY_CARE_PROVIDER_SITE_OTHER): Payer: 59

## 2017-09-18 ENCOUNTER — Ambulatory Visit (INDEPENDENT_AMBULATORY_CARE_PROVIDER_SITE_OTHER)
Admission: RE | Admit: 2017-09-18 | Discharge: 2017-09-18 | Disposition: A | Discharge status: Home / Self Care | Payer: 59 | Source: Ambulatory Visit | Attending: Family Medicine | Admitting: Family Medicine

## 2017-09-18 ENCOUNTER — Encounter: Payer: Self-pay | Admitting: Family Medicine

## 2017-09-18 VITALS — BP 108/80 | HR 96 | Ht 70.0 in | Wt 238.0 lb

## 2017-09-18 DIAGNOSIS — M542 Cervicalgia: Secondary | ICD-10-CM | POA: Diagnosis not present

## 2017-09-18 DIAGNOSIS — M5412 Radiculopathy, cervical region: Secondary | ICD-10-CM

## 2017-09-18 DIAGNOSIS — M2550 Pain in unspecified joint: Secondary | ICD-10-CM | POA: Diagnosis not present

## 2017-09-18 LAB — SEDIMENTATION RATE: Sed Rate: 75 mm/hr — ABNORMAL HIGH (ref 0–20)

## 2017-09-18 LAB — C-REACTIVE PROTEIN: CRP: 3.5 mg/dL (ref 0.5–20.0)

## 2017-09-18 LAB — IBC PANEL
Iron: 79 ug/dL (ref 42–145)
Saturation Ratios: 22.4 % (ref 20.0–50.0)
Transferrin: 252 mg/dL (ref 212.0–360.0)

## 2017-09-18 LAB — URIC ACID: URIC ACID, SERUM: 5.5 mg/dL (ref 2.4–7.0)

## 2017-09-18 NOTE — Assessment & Plan Note (Signed)
Patient is having worsening symptoms. 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 This is different than patient previous exam. Patient is concern for more of a proximity given her a polyarthralgia will get laboratory workup for this. I do feel x-rays of the neck are needed. With patient having some very mild weakness of the C6 distribution as well as [facility] imaging may be warranted if any worsening symptoms. Deep tendon refluxes are intact at this moment but patient's pain is seeming to be debilitating. Following up closely in the next 2 weeks otherwise.

## 2017-09-18 NOTE — Patient Instructions (Addendum)
Good to see you  I am sorry we have not made significant strides at this time.  Ice is your friend.  Lets get labs and xray downstairs today  Read about effexor or cymbalta On Monday send me a note and if not better we will consider MRI of your neck

## 2017-09-19 ENCOUNTER — Encounter: Payer: Self-pay | Admitting: Family Medicine

## 2017-09-19 LAB — ANA: Anti Nuclear Antibody(ANA): NEGATIVE

## 2017-09-19 LAB — PTH, INTACT AND CALCIUM
CALCIUM: 9.4 mg/dL (ref 8.6–10.2)
PTH: 21 pg/mL (ref 14–64)

## 2017-09-19 LAB — CYCLIC CITRUL PEPTIDE ANTIBODY, IGG: Cyclic Citrullin Peptide Ab: 230 UNITS — ABNORMAL HIGH

## 2017-09-19 LAB — CALCIUM, IONIZED: CALCIUM ION: 5.4 mg/dL (ref 4.8–5.6)

## 2017-09-19 LAB — RHEUMATOID FACTOR: Rhuematoid fact SerPl-aCnc: 18 IU/mL — ABNORMAL HIGH (ref <14)

## 2017-09-21 ENCOUNTER — Encounter: Payer: Self-pay | Admitting: Family Medicine

## 2017-09-22 ENCOUNTER — Encounter: Payer: Self-pay | Admitting: Family Medicine

## 2017-09-22 DIAGNOSIS — T50901A Poisoning by unspecified drugs, medicaments and biological substances, accidental (unintentional), initial encounter: Secondary | ICD-10-CM | POA: Diagnosis not present

## 2017-09-22 DIAGNOSIS — R768 Other specified abnormal immunological findings in serum: Secondary | ICD-10-CM

## 2017-09-22 NOTE — Telephone Encounter (Signed)
Referral entered for rheumatology.

## 2017-09-26 ENCOUNTER — Other Ambulatory Visit: Payer: Self-pay | Admitting: *Deleted

## 2017-09-26 DIAGNOSIS — M5412 Radiculopathy, cervical region: Secondary | ICD-10-CM

## 2017-09-29 DIAGNOSIS — M2550 Pain in unspecified joint: Secondary | ICD-10-CM | POA: Diagnosis not present

## 2017-09-29 DIAGNOSIS — Z6833 Body mass index (BMI) 33.0-33.9, adult: Secondary | ICD-10-CM | POA: Diagnosis not present

## 2017-09-29 DIAGNOSIS — Z8709 Personal history of other diseases of the respiratory system: Secondary | ICD-10-CM | POA: Diagnosis not present

## 2017-09-29 DIAGNOSIS — R768 Other specified abnormal immunological findings in serum: Secondary | ICD-10-CM | POA: Diagnosis not present

## 2017-09-29 DIAGNOSIS — E669 Obesity, unspecified: Secondary | ICD-10-CM | POA: Diagnosis not present

## 2017-10-07 ENCOUNTER — Encounter: Payer: Self-pay | Admitting: Family Medicine

## 2017-10-15 ENCOUNTER — Ambulatory Visit
Admission: RE | Admit: 2017-10-15 | Discharge: 2017-10-15 | Disposition: A | Discharge status: Home / Self Care | Payer: 59 | Source: Ambulatory Visit | Attending: Family Medicine | Admitting: Family Medicine

## 2017-10-15 DIAGNOSIS — M50222 Other cervical disc displacement at C5-C6 level: Secondary | ICD-10-CM | POA: Diagnosis not present

## 2017-10-15 DIAGNOSIS — M5412 Radiculopathy, cervical region: Secondary | ICD-10-CM

## 2017-10-16 ENCOUNTER — Encounter: Payer: Self-pay | Admitting: Family Medicine

## 2017-10-21 NOTE — Progress Notes (Signed)
[name] [address] [place], [place] [address] Phone: [phone] Subjective:    I'm seeing this patient by the request  of:    CC: Right-sided neck and back pain  [id number]  April [name] is a 41 y.o. female coming in with complaint of right sided back pain. Rib pain on the right side. Was recently diagnosed with rheumatoid arthritis. Had an MRI last week that showed neck issues.  Patient was found to have a small protruding disc.  States that the neck seems to be doing relatively better than the right shoulder pain.  Describes the pain as a dull, throbbing aching sensation.  Can catch her breath.  Some pain over the costochondral area as well.  Denies any true chest pain no.  There is a severity of pain is 5 out of 10  Recently diagnosed with rheumatoid arthritis.  Following up with rheumatology in 10 days.  Having difficulty with this diagnosis.       Past Medical History:  Diagnosis Date  . ALLERGIC RHINITIS   . ANXIETY DEPRESSION   . CONSTIPATION   . DEPRESSION   . Esophageal reflux   . HYPERCHOLESTEROLEMIA   . OBESITY   . Occipital neuralgia   . Palpitations    Past Surgical History:  Procedure Laterality Date  . NO PAST SURGERIES     Social History   Socioeconomic History  . Marital status: Married    Spouse name: None  . Number of children: None  . Years of education: None  . Highest education level: None  Social Needs  . Financial resource strain: None  . Food insecurity - worry: None  . Food insecurity - inability: None  . Transportation needs - medical: None  . Transportation needs - non-medical: None  Occupational History  . None  Tobacco Use  . Smoking status: Never Smoker  . Smokeless tobacco: Never Used  . Tobacco comment: Married, lives with spouse  Substance and Sexual Activity  . Alcohol use: No  . Drug use: No  . Sexual activity: Yes    Partners: Male    Birth control/protection: Inserts   Comment: 1ST INTERCOURSE- 21, PARTNERS- 3   Other Topics Concern  . None  Social History Narrative  . None   Allergies  Allergen Reactions  . Ciprofloxacin     toxicity  . Levaquin [Levofloxacin] Anxiety    Possible CNS side effects. TOXICITY  . Moxifloxacin     TOXICITY   Family History  Problem Relation Age of Onset  . Hypertension Mother   . Diabetes Mother   . Hyperlipidemia Mother   . Breast cancer Mother 56  . Osteoarthritis Mother   . Hyperlipidemia Father   . Coronary artery disease Father        MI age 67  . Hyperlipidemia Brother      Past medical history, social, surgical and family history all reviewed in electronic medical record.  No pertanent information unless stated regarding to the chief complaint.   Review of Systems:Review of systems updated and as accurate as of 10/22/17  No headache, visual changes, nausea, vomiting, diarrhea, constipation, dizziness, abdominal pain, skin rash, fevers, chills, night sweats, weight loss, swollen lymph nodes,chest pain, shortness of breath, mood changes.  Positive body aches, muscle aches  Objective  Blood pressure 130/80, pulse 91, height 5\' 10"  (1.778 m), weight 226 lb (102.5 kg), SpO2 98 %. Systems examined below as of 10/22/17   General: No apparent  distress alert and oriented x3 mood and affect normal, dressed appropriately.  HEENT: Pupils equal, extraocular movements intact  Respiratory: Patient's speak in full sentences and does not appear short of breath  Cardiovascular: No lower extremity edema, non tender, no erythema  Skin: Warm dry intact with no signs of infection or rash on extremities or on axial skeleton.  Abdomen: Soft nontender  Neuro: Cranial nerves II through XII are intact, neurovascularly intact in all extremities with 2+ DTRs and 2+ pulses.  Lymph: No lymphadenopathy of posterior or anterior cervical chain or axillae bilaterally.  Gait normal with good balance and coordination.  MSK:  Non  tender with full range of motion and good stability and symmetric strength and tone of shoulders, elbows, wrist, hip, knee and ankles bilaterally.  Neck: Inspection loss of lordosis. No palpable stepoffs. Negative Spurling's maneuver. Full neck range of motion Grip strength and sensation normal in bilateral hands Strength good C4 to T1 distribution No sensory change to C4 to T1 Negative Hoffman sign bilaterally Reflexes normal  positive right-sided trapezius muscle spasm  Osteopathic findings C2 flexed rotated and side bent right C6 flexed rotated and side bent left T3 extended rotated and side bent right inhaled third rib T6 extended rotated and side bent left      Impression and Recommendations:     This case required medical decision making of moderate complexity.      Note: This dictation was prepared with Dragon dictation along with smaller phrase technology. Any transcriptional errors that result from this process are unintentional.

## 2017-10-22 ENCOUNTER — Ambulatory Visit (INDEPENDENT_AMBULATORY_CARE_PROVIDER_SITE_OTHER): Payer: 59 | Admitting: Family Medicine

## 2017-10-22 ENCOUNTER — Encounter: Payer: Self-pay | Admitting: Family Medicine

## 2017-10-22 VITALS — BP 130/80 | HR 91 | Ht 70.0 in | Wt 226.0 lb

## 2017-10-22 DIAGNOSIS — M999 Biomechanical lesion, unspecified: Secondary | ICD-10-CM

## 2017-10-22 DIAGNOSIS — M5412 Radiculopathy, cervical region: Secondary | ICD-10-CM | POA: Diagnosis not present

## 2017-10-22 DIAGNOSIS — M940 Chondrocostal junction syndrome [Tietze]: Secondary | ICD-10-CM

## 2017-10-22 MED ORDER — IBUPROFEN-FAMOTIDINE 800-26.6 MG PO TABS: 800.0000 mg | ORAL_TABLET | Freq: Three times a day (TID) | ORAL | 3 refills | Status: DC

## 2017-10-22 MED ORDER — COLCHICINE 0.6 MG PO TABS: 0.6000 mg | ORAL_TABLET | Freq: Two times a day (BID) | ORAL | 2 refills | Status: DC

## 2017-10-22 MED FILL — COLCHICINE 0.6 MG TABS: 0.6 | 5 days supply | Qty: 10 | Fill #0

## 2017-10-22 NOTE — Assessment & Plan Note (Signed)
Decision today to treat with OMT was based on Physical Exam  After verbal consent patient was treated with HVLA, ME, FPR techniques in cervical, thoracic, rib areas  Patient tolerated the procedure well with improvement in symptoms  Patient given exercises, stretches and lifestyle modifications  See medications in patient instructions if given  Patient will follow up in 3 weeks 

## 2017-10-22 NOTE — Patient Instructions (Signed)
Good to see you  April [name] is your friend.  Have fun in the hot tub You will do great with everything.  Duexis up to 3 times a day  See me again in 3 weeks Happy holidays!

## 2017-10-22 NOTE — Assessment & Plan Note (Signed)
MRI does show a very small protruding disc but no real nerve impingement.  We will continue to monitor.

## 2017-10-22 NOTE — Assessment & Plan Note (Signed)
Slipped rib syndrome.  Discussed with patient in great length about icing regimen, home exercise, which activities of doing which wants to avoid.  Patient was to increase activity slowly over the course of next several days.  Like to see the patient every 3 weeks for quite some time.  Did respond well to osteopathic manipulation.

## 2017-10-24 ENCOUNTER — Encounter: Payer: Self-pay | Admitting: Podiatry

## 2017-10-24 ENCOUNTER — Ambulatory Visit (INDEPENDENT_AMBULATORY_CARE_PROVIDER_SITE_OTHER): Payer: 59

## 2017-10-24 ENCOUNTER — Ambulatory Visit: Payer: 59 | Admitting: Podiatry

## 2017-10-24 VITALS — BP 134/76 | HR 82 | Resp 16

## 2017-10-24 DIAGNOSIS — M216X9 Other acquired deformities of unspecified foot: Secondary | ICD-10-CM

## 2017-10-24 DIAGNOSIS — M722 Plantar fascial fibromatosis: Secondary | ICD-10-CM

## 2017-10-24 NOTE — Progress Notes (Signed)
Subjective:  [name], female    DOB: 03-19-1976,  MRN: [id number]  Chief Complaint  Patient presents with  . Foot Pain    Plantar heel right - aching x 1.5 weeks, AM pain and is usually constant all day, tried Ibuprofen 800mg  BID-TID-daily use for RA, diclofenac gel helps some, wears orthotics occasionally-brought today    41 y.o. female presents with the above complaint.  Reports pain in the right heel for 1/2 weeks worse in the a.m. worse through the day has tried ibuprofen and diclofenac gel and wears over-the-counter orthotics Past Medical History:  Diagnosis Date  . ALLERGIC RHINITIS   . ANXIETY DEPRESSION   . CONSTIPATION   . DEPRESSION   . Esophageal reflux   . HYPERCHOLESTEROLEMIA   . OBESITY   . Occipital neuralgia   . Palpitations    Past Surgical History:  Procedure Laterality Date  . NO PAST SURGERIES      Current Outpatient Medications:  .  ascorbic acid (VITAMIN C) 1000 MG tablet, Take 1,000 mg by mouth daily., Disp: , Rfl:  .  Ashwagandha 500 MG CAPS, Take 500 mg by mouth. Take 600 mg by mouth at bedtime., Disp: , Rfl:  .  busPIRone (BUSPAR) 15 MG tablet, 1  qam   2  q noon (Patient taking differently: 1 tab BID PRN), Disp: 90 tablet, Rfl: 5 .  cetirizine (ZYRTEC) 10 MG tablet, Take 1 tablet (10 mg total) by mouth daily., Disp: 30 tablet, Rfl: 3 .  cholecalciferol (VITAMIN D) 1000 units tablet, Take 5,000 Units by mouth daily., Disp: , Rfl:  .  clonazePAM (KLONOPIN) 1 MG tablet, Take 1 mg by mouth daily as needed for anxiety (take half a tablet as needed)., Disp: , Rfl:  .  Coenzyme Q10 (COQ-10) 200 MG CAPS, Take by mouth., Disp: , Rfl:  .  colchicine 0.6 MG tablet, Take 1 tablet (0.6 mg total) by mouth 2 (two) times daily., Disp: 10 tablet, Rfl: 2 .  Cranberry-Milk Thistle (LIVER & KIDNEY CLEANSER PO), Take by mouth 3 (three) times daily., Disp: , Rfl:  .  doxylamine, Sleep, (UNISOM) 25 MG tablet, Take 25 mg by mouth at bedtime as needed.  Taka half a tablet as needed, Disp: , Rfl:  .  escitalopram (LEXAPRO) 20 MG tablet, Take 20 mg by mouth daily., Disp: , Rfl:  .  esomeprazole (NEXIUM) 40 MG capsule, Take 1 capsule (40 mg total) by mouth daily., Disp: 90 capsule, Rfl: 3 .  etonogestrel-ethinyl estradiol (NUVARING) 0.12-0.015 MG/24HR vaginal ring, Continuous use.  Remove and insert new ring every 4 weeks., Disp: 3 each, Rfl: 4 .  gabapentin (NEURONTIN) 800 MG tablet, Take 600 mg by mouth at bedtime. Take 300-600 mg at bedtime, Disp: , Rfl:  .  Ibuprofen-Famotidine (DUEXIS) 800-26.6 MG TABS, Take 800 mg by mouth 3 (three) times daily., Disp: 90 tablet, Rfl: 3 .  L-Theanine 100 MG CAPS, Take 200 mg by mouth 2 (two) times daily., Disp: , Rfl:  .  meclizine (ANTIVERT) 25 MG tablet, , Disp: , Rfl:  .  montelukast (SINGULAIR) 10 MG tablet, Take 1 tablet (10 mg total) by mouth at bedtime., Disp: 90 tablet, Rfl: 3 .  Multiple Vitamin (MULTIVITAMIN) tablet, Take 1 tablet by mouth daily.  , Disp: , Rfl:  .  Multiple Vitamins-Iron (CHLORELLA PO), Take 1 capsule by mouth 2 (two) times daily., Disp: , Rfl:  .  Omega-3 Fatty Acids (FISH OIL) 1000 MG CAPS, Take 2,000 mg  by mouth 2 (two) times daily. , Disp: , Rfl:  .  ondansetron (ZOFRAN) 4 MG tablet, Take 4 mg by mouth every 8 (eight) hours as needed for nausea or vomiting. 4-6 hours PRN, Disp: , Rfl:  .  Probiotic Product (SOLUBLE FIBER/PROBIOTICS PO), Take 2 capsules by mouth 2 (two) times daily., Disp: , Rfl:  .  propranolol (INDERAL) 10 MG tablet, 1 bid (Patient not taking: Reported on 10/22/2017), Disp: 60 tablet, Rfl: 5 .  vitamin E 400 UNIT capsule, Take 400 Units by mouth daily., Disp: , Rfl:  .  zolpidem (AMBIEN) 5 MG tablet, Take 1 tablet (5 mg total) by mouth at bedtime as needed for sleep., Disp: 30 tablet, Rfl: 2  Allergies  Allergen Reactions  . Ciprofloxacin     toxicity  . Levaquin [Levofloxacin] Anxiety    Possible CNS side effects. TOXICITY  . Moxifloxacin     TOXICITY    Review of Systems Objective:   Vitals:   10/24/17 0858  BP: 134/76  Pulse: 82  Resp: 16   General AA&O x3. Normal mood and affect.  Vascular Dorsalis pedis and posterior tibial pulses  present 2+ bilaterally  Capillary refill normal to all digits. Pedal hair growth normal.  Neurologic Epicritic sensation grossly present.  Dermatologic No open lesions. Interspaces clear of maceration. Nails well groomed and normal in appearance.  Orthopedic: MMT 5/5 in dorsiflexion, plantarflexion, inversion, and eversion. Normal joint ROM without pain or crepitus. Palpation right medial calcaneal tuber  Radio graphs taken and reviewed no acute fractures dislocations  Assessment & Plan:  Patient was evaluated and treated and all questions answered.  Plantar Fasciitis, right - XR reviewed as above.  - Educated on icing and stretching. Instructions given.  - Injection delivered to the plantar fascia as below. - Night splint dispensed. -She would benefit from custom molded orthotics  Procedure: Injection Tendon/Ligament Location: Right plantar fascia at the glabrous junction; medial approach. Skin Prep: Alcohol. Injectate: 1 cc 0.5% marcaine plain, 1 cc dexamethasone phosphate, 0.5 cc kenalog 10. Disposition: Patient tolerated procedure well. Injection site dressed with a band-aid.  Return in about 3 weeks (around 11/14/2017) for Plantar fasciitis.

## 2017-10-24 NOTE — Patient Instructions (Signed)

## 2017-10-28 MED FILL — NUVARING VAGINAL RING: 0.12-0.015 | 84 days supply | Qty: 3 | Fill #2

## 2017-10-29 DIAGNOSIS — R922 Inconclusive mammogram: Secondary | ICD-10-CM | POA: Diagnosis not present

## 2017-10-29 DIAGNOSIS — Z803 Family history of malignant neoplasm of breast: Secondary | ICD-10-CM | POA: Diagnosis not present

## 2017-10-29 LAB — HM MAMMOGRAPHY

## 2017-10-29 MED FILL — ESCITALOPRAM 20 MG TABLET: 20 | 90 days supply | Qty: 90 | Fill #1

## 2017-10-29 MED FILL — MONTELUKAST SOD 10 MG TAB: 10 | 90 days supply | Qty: 90 | Fill #1

## 2017-10-29 MED FILL — busPIRone HCL 7.5 MG TABS: 7.5 | 30 days supply | Qty: 60 | Fill #0

## 2017-10-29 MED FILL — GABAPENTIN 600 MG TABLET: 600 | 30 days supply | Qty: 90 | Fill #3

## 2017-10-29 MED FILL — ESOMEPRAZOLE MAG DR 40 MG C: 40 | 90 days supply | Qty: 90 | Fill #0

## 2017-10-30 ENCOUNTER — Ambulatory Visit (HOSPITAL_COMMUNITY): Payer: Self-pay | Admitting: Psychiatry

## 2017-10-30 DIAGNOSIS — R197 Diarrhea, unspecified: Secondary | ICD-10-CM | POA: Diagnosis not present

## 2017-10-30 DIAGNOSIS — R195 Other fecal abnormalities: Secondary | ICD-10-CM | POA: Diagnosis not present

## 2017-10-30 DIAGNOSIS — R109 Unspecified abdominal pain: Secondary | ICD-10-CM | POA: Diagnosis not present

## 2017-10-30 DIAGNOSIS — R110 Nausea: Secondary | ICD-10-CM | POA: Diagnosis not present

## 2017-11-03 DIAGNOSIS — M2550 Pain in unspecified joint: Secondary | ICD-10-CM | POA: Diagnosis not present

## 2017-11-03 DIAGNOSIS — R51 Headache: Secondary | ICD-10-CM | POA: Diagnosis not present

## 2017-11-03 DIAGNOSIS — E669 Obesity, unspecified: Secondary | ICD-10-CM | POA: Diagnosis not present

## 2017-11-03 DIAGNOSIS — G43719 Chronic migraine without aura, intractable, without status migrainosus: Secondary | ICD-10-CM | POA: Diagnosis not present

## 2017-11-03 DIAGNOSIS — M0589 Other rheumatoid arthritis with rheumatoid factor of multiple sites: Secondary | ICD-10-CM | POA: Diagnosis not present

## 2017-11-03 DIAGNOSIS — Z8709 Personal history of other diseases of the respiratory system: Secondary | ICD-10-CM | POA: Diagnosis not present

## 2017-11-03 DIAGNOSIS — R768 Other specified abnormal immunological findings in serum: Secondary | ICD-10-CM | POA: Diagnosis not present

## 2017-11-03 DIAGNOSIS — Z6833 Body mass index (BMI) 33.0-33.9, adult: Secondary | ICD-10-CM | POA: Diagnosis not present

## 2017-11-03 MED FILL — LEFLUNOMIDE 20 MG TABLET: 20 | 30 days supply | Qty: 30 | Fill #0

## 2017-11-03 MED FILL — predniSONE 5 MG TABS: 5 | 12 days supply | Qty: 48 | Fill #0

## 2017-11-05 ENCOUNTER — Encounter (HOSPITAL_COMMUNITY): Payer: Self-pay | Admitting: Psychiatry

## 2017-11-05 ENCOUNTER — Ambulatory Visit (HOSPITAL_COMMUNITY): Payer: 59 | Admitting: Psychiatry

## 2017-11-05 VITALS — BP 128/76 | HR 76 | Ht 71.5 in | Wt 227.0 lb

## 2017-11-05 DIAGNOSIS — M069 Rheumatoid arthritis, unspecified: Secondary | ICD-10-CM

## 2017-11-05 DIAGNOSIS — F3340 Major depressive disorder, recurrent, in remission, unspecified: Secondary | ICD-10-CM

## 2017-11-05 DIAGNOSIS — F321 Major depressive disorder, single episode, moderate: Secondary | ICD-10-CM

## 2017-11-05 DIAGNOSIS — Z79899 Other long term (current) drug therapy: Secondary | ICD-10-CM | POA: Diagnosis not present

## 2017-11-05 MED ORDER — ESCITALOPRAM OXALATE 20 MG PO TABS: 20.0000 mg | ORAL_TABLET | Freq: Every day | ORAL | 4 refills | Status: DC

## 2017-11-05 MED ORDER — BUSPIRONE HCL 15 MG PO TABS: ORAL_TABLET | ORAL | 5 refills | Status: DC

## 2017-11-05 MED FILL — busPIRone HCL 15 MG TABS: 15 | 30 days supply | Qty: 60 | Fill #0

## 2017-11-05 NOTE — Progress Notes (Signed)
[facility] MD Progress Note  11/05/2017 3:32 PM April [name]  MRN:  [id number] Subjective: Doing great Principal Problem: Major depression, recurrent, remission Diagnosis:  Major depression, recurrent, remission  At this time the patient is not feeling well. She's just been diagnosed with rheumatoid arthritis. Fortunately only affects her shoulders and perhaps her chest. It might be affecting her back but does not affect her hands or elbows or her feet. The patient is file). This is a patient who had a quinolone toxicity reaction she got extremely anxious and depressed. This seems to be better and up until a month ago she was actually doing fairly well. A month ago her physical complaints C rheumatologist/diagnosis rheumatoid arthritis which bothers her a lot. The patient describes persistent daily depression. She sleeping and eating well. She's got good energy she's having no problems at work. She is thinking concentrating well. She denies worthlessness. She is fleeting suicidal considerations she's never acted on these thoughts. The patient is not acutely suicidal. She says she never did visit with her husband the cancer. Patient is actively in psychotherapy with Dr. Cheryln Manly. The holidays are not easy for her. The patient denies the use of alcohol or drugs.other than some pain in her shoulders she denies any significant physical complaints. Patient Active Problem List   Diagnosis Date Noted  . Cervical radicular pain [M54.12] 09/18/2017  . Poor posture [R29.3] 08/14/2017  . Left otitis media [H66.92] 01/07/2017  . Eustachian tube dysfunction [H69.80] 01/07/2017  . Chronic sinusitis [J32.9] 01/07/2017  . N&V (nausea and vomiting) [R11.2] 12/31/2016  . Vertigo [R42] 12/31/2016  . 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 Nonallopathic lesion of thoracic region [M99.9] 06/03/2016  . Nonallopathic lesion-rib cage [M99.9] 06/03/2016  . Nonallopathic lesion of cervical region [M99.9] 06/03/2016   . Rash [R21] 05/22/2016  . Acute left-sided thoracic back pain [M54.6] 05/22/2016  . Hyperglycemia [R73.9] 05/03/2016  . Preventative health care [Z00.00] 05/04/2015  . Left hamstring injury [S76.302A] 01/24/2015  . Major depressive disorder, recurrent episode, moderate ([place]) [F33.1] 12/02/2014  . Occipital neuralgia [M54.81]   . ANEMIA-NOS [D64.9] 12/11/2009  . Hyperlipidemia [E78.5] 11/16/2008  . Obesity [E66.9] 11/16/2008  . ANXIETY DEPRESSION [F34.1] 11/16/2008  . Allergic rhinitis [J30.9] 11/16/2008  . Esophageal reflux [K21.9] 11/16/2008  . Constipation [K59.00] 11/16/2008  . Palpitations [R00.2] 11/16/2008   Total Time spent with patient: 30 minutes  Past Psychiatric History:   Past Medical History:  Past Medical History:  Diagnosis Date  . ALLERGIC RHINITIS   . ANXIETY DEPRESSION   . CONSTIPATION   . DEPRESSION   . Esophageal reflux   . HYPERCHOLESTEROLEMIA   . OBESITY   . Occipital neuralgia   . Palpitations     Past Surgical History:  Procedure Laterality Date  . NO PAST SURGERIES     Family History:  Family History  Problem Relation Age of Onset  . Hypertension Mother   . Diabetes Mother   . Hyperlipidemia Mother   . Breast cancer Mother 36  . Osteoarthritis Mother   . Hyperlipidemia Father   . Coronary artery disease Father        MI age 17  . Hyperlipidemia Brother    Family Psychiatric  History:  Social History:  Social History   Substance and Sexual Activity  Alcohol Use No     Social History   Substance and Sexual Activity  Drug Use No    Social History   Socioeconomic History  . Marital status: Married  Spouse name: None  . Number of children: None  . Years of education: None  . Highest education level: None  Social Needs  . Financial resource strain: None  . Food insecurity - worry: None  . Food insecurity - inability: None  . Transportation needs - medical: None  . Transportation needs - non-medical: None  Occupational  History  . None  Tobacco Use  . Smoking status: Never Smoker  . Smokeless tobacco: Never Used  . Tobacco comment: Married, lives with spouse  Substance and Sexual Activity  . Alcohol use: No  . Drug use: No  . Sexual activity: Yes    Partners: Male    Birth control/protection: Inserts    Comment: 1ST INTERCOURSE- 21, PARTNERS- 3   Other Topics Concern  . None  Social History Narrative  . None   Additional Social History:                         Sleep: Good  Appetite:  Good  Current Medications: Current Outpatient Medications  Medication Sig Dispense Refill  . ascorbic acid (VITAMIN C) 1000 MG tablet Take 1,000 mg by mouth daily.    . Ashwagandha 500 MG CAPS Take 500 mg by mouth. Take 600 mg by mouth at bedtime.    . cetirizine (ZYRTEC) 10 MG tablet Take 1 tablet (10 mg total) by mouth daily. 30 tablet 3  . cholecalciferol (VITAMIN D) 1000 units tablet Take 5,000 Units by mouth daily.    . clonazePAM (KLONOPIN) 1 MG tablet Take 1 mg by mouth daily as needed for anxiety (take half a tablet as needed).    Marland Kitchen escitalopram (LEXAPRO) 20 MG tablet Take 1 tablet (20 mg total) by mouth daily. 30 tablet 4  . esomeprazole (NEXIUM) 40 MG capsule Take 1 capsule (40 mg total) by mouth daily. 90 capsule 3  . etonogestrel-ethinyl estradiol (NUVARING) 0.12-0.015 MG/24HR vaginal ring Continuous use.  Remove and insert new ring every 4 weeks. 3 each 4  . gabapentin (NEURONTIN) 800 MG tablet Take 600 mg by mouth at bedtime. Take 300-600 mg at bedtime    . Ibuprofen-Famotidine (DUEXIS) 800-26.6 MG TABS Take 800 mg by mouth 3 (three) times daily. 90 tablet 3  . leflunomide (ARAVA) 20 MG tablet Take 20 mg by mouth daily.    . meclizine (ANTIVERT) 25 MG tablet     . montelukast (SINGULAIR) 10 MG tablet Take 1 tablet (10 mg total) by mouth at bedtime. 90 tablet 3  . Multiple Vitamin (MULTIVITAMIN) tablet Take 1 tablet by mouth daily.      . Multiple Vitamins-Iron (CHLORELLA PO) Take 1  capsule by mouth 2 (two) times daily.    . Omega-3 Fatty Acids (FISH OIL) 1000 MG CAPS Take 2,000 mg by mouth 2 (two) times daily.     . ondansetron (ZOFRAN) 4 MG tablet Take 4 mg by mouth every 8 (eight) hours as needed for nausea or vomiting. 4-6 hours PRN    . Probiotic Product (SOLUBLE FIBER/PROBIOTICS PO) Take 2 capsules by mouth 2 (two) times daily.    . vitamin E 400 UNIT capsule Take 400 Units by mouth daily.    Marland Kitchen zolpidem (AMBIEN) 5 MG tablet Take 1 tablet (5 mg total) by mouth at bedtime as needed for sleep. 30 tablet 2  . busPIRone (BUSPAR) 15 MG tablet 1 bid 60 tablet 5  . Coenzyme Q10 (COQ-10) 200 MG CAPS Take by mouth.    . colchicine  0.6 MG tablet Take 1 tablet (0.6 mg total) by mouth 2 (two) times daily. (Patient not taking: Reported on 11/05/2017) 10 tablet 2  . Cranberry-Milk Thistle (LIVER & KIDNEY CLEANSER PO) Take by mouth 3 (three) times daily.    Marland Kitchen doxylamine, Sleep, (UNISOM) 25 MG tablet Take 25 mg by mouth at bedtime as needed. Taka half a tablet as needed    . L-Theanine 100 MG CAPS Take 200 mg by mouth 2 (two) times daily.    . propranolol (INDERAL) 10 MG tablet 1 bid (Patient not taking: Reported on 10/22/2017) 60 tablet 5   No current facility-administered medications for this visit.     Lab Results:  No results found for this or any previous visit (from the past 48 hour(s)).  Blood Alcohol level:  No results found for: [facility]  Physical Findings: AIMS:  , ,  ,  ,    CIWA:    COWS:     Musculoskeletal: Strength & Muscle Tone: within normal limits Gait & Station: normal Patient leans: N/A  Psychiatric Specialty Exam: ROS  Blood pressure 128/76, pulse 76, height 5' 11.5" (1.816 m), weight 227 lb (103 kg), SpO2 98 %.Body mass index is 31.22 kg/m.  General Appearance: Casual  Eye Contact::  Good  Speech:  Clear and Coherent  Volume:  Normal  Mood:  Euthymic  Affect:  Appropriate  Thought Process:  Coherent  Orientation:  Full (Time, Place, and Person)   Thought Content:  WDL  Suicidal Thoughts:  No  Homicidal Thoughts:  No  Memory:  NA  Judgement:  Good  Insight:  Good  Psychomotor Activity:  Normal  Concentration:  Good  Recall:  Good  Fund of Knowledge:Good  Language: Good  Akathisia:  No  Handed:  Right  AIMS (if indicated):     Assets:  Desire for Improvement  ADL's:  Intact  Cognition: WNL  Sleep:      Treatment Plan Summary:  At this time we will continue her Lexapro the same dose 20 mg. She had a very bad experience taking TRintelix and therefore went back to Lexapro. The patient is not taking appropriate dose of BuSpar. She only takes it once or twice a week. The patient does describe significant anxiety this time. Noted is that she no longer takes propranolol as her tremor has gone away. It should be pointed out propranolol might help her anxiety. Nonetheless at this time she is experiencing a distinct depression and anxiety reaction to her diagnosis of rheumatoid arthritis. Is a psychological reaction this condition. At this time her last. Take her BuSpar regular basis 15 mg twice a day. Last curvature to see her therapist get through the holidays for now continue Lexapro 20 mg. The patient she'll return to see me in one month we'll reevaluate her antidepressant treatment. I do not think this patient is significantly suicidal

## 2017-11-12 ENCOUNTER — Ambulatory Visit (INDEPENDENT_AMBULATORY_CARE_PROVIDER_SITE_OTHER): Payer: 59 | Admitting: Psychology

## 2017-11-12 DIAGNOSIS — F4323 Adjustment disorder with mixed anxiety and depressed mood: Secondary | ICD-10-CM | POA: Diagnosis not present

## 2017-11-12 NOTE — Progress Notes (Signed)
[name] [place],  [address] Phone: [phone] Subjective:       CC: Back pain follow-up  [id number]  April [name] is a 41 y.o. female coming in with complaint of back pain.  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  Has been doing relatively well with home exercises, icing regimen, patient did have laboratory workup showing the possibility of rheumatoid arthritis and patient is seeing a rheumatologist.  Patient states she is having worsening mid back pain.  Patient states it is severe.  Affecting daily activities.  Patient states that her depression is not as well controlled either but denies any suicidal or homicidal ideation.       Past Medical History:  Diagnosis Date  . ALLERGIC RHINITIS   . ANXIETY DEPRESSION   . CONSTIPATION   . DEPRESSION   . Esophageal reflux   . HYPERCHOLESTEROLEMIA   . OBESITY   . Occipital neuralgia   . Palpitations    Past Surgical History:  Procedure Laterality Date  . NO PAST SURGERIES     Social History   Socioeconomic History  . Marital status: Married    Spouse name: None  . Number of children: None  . Years of education: None  . Highest education level: None  Social Needs  . Financial resource strain: None  . Food insecurity - worry: None  . Food insecurity - inability: None  . Transportation needs - medical: None  . Transportation needs - non-medical: None  Occupational History  . None  Tobacco Use  . Smoking status: Never Smoker  . Smokeless tobacco: Never Used  . Tobacco comment: Married, lives with spouse  Substance and Sexual Activity  . Alcohol use: No  . Drug use: No  . Sexual activity: Yes    Partners: Male    Birth control/protection: Inserts    Comment: 1ST INTERCOURSE- 21, PARTNERS- 3   Other Topics Concern  . None  Social History Narrative  . None   Allergies  Allergen Reactions  . Ciprofloxacin     toxicity  . Levaquin [Levofloxacin]  Anxiety    Possible CNS side effects. TOXICITY  . Moxifloxacin     TOXICITY   Family History  Problem Relation Age of Onset  . Hypertension Mother   . Diabetes Mother   . Hyperlipidemia Mother   . Breast cancer Mother 46  . Osteoarthritis Mother   . Hyperlipidemia Father   . Coronary artery disease Father        MI age 78  . Hyperlipidemia Brother      Past medical history, social, surgical and family history all reviewed in electronic medical record.  No pertanent information unless stated regarding to the chief complaint.   Review of Systems:Review of systems updated and as accurate as of 11/13/17  No headache, visual changes, nausea, vomiting, diarrhea, constipation, dizziness, abdominal pain, skin rash, fevers, chills, night sweats, weight loss, swollen lymph nodes, body aches, joint swelling, muscle aches, chest pain, shortness of breath, mood changes.  Positive muscle aches  Objective  Blood pressure 138/82, pulse 87, height 5\' 10"  (1.778 m), weight 224 lb (101.6 kg), SpO2 97 %. Systems examined below as of 11/13/17   General: No apparent distress alert and oriented x3 mood and affect normal, dressed appropriately.  HEENT: Pupils equal, extraocular movements intact  Respiratory: Patient's speak in full sentences and does not appear short of breath  Cardiovascular: No lower extremity edema, non tender, no  erythema  Skin: Warm dry intact with no signs of infection or rash on extremities or on axial skeleton.  Abdomen: Soft nontender  Neuro: Cranial nerves II through XII are intact, neurovascularly intact in all extremities with 2+ DTRs and 2+ pulses.  Lymph: No lymphadenopathy of posterior or anterior cervical chain or axillae bilaterally.  Gait normal with good balance and coordination.  MSK:  Non tender with full range of motion and good stability and symmetric strength and tone of shoulders, elbows, wrist, hip, knee and ankles bilaterally.  Back Exam:  Inspection: Mild  loss of lordosis Motion: Flexion 25 deg, Extension 25 deg, Side Bending to 35 deg bilaterally,  Rotation to 25 deg bilaterally  SLR laying: Negative  XSLR laying: Negative  Palpable tenderness: More tenderness at the L1-L2 paraspinal musculature. FABER: Tightness bilaterally. Sensory change: Gross sensation intact to all lumbar and sacral dermatomes.  Reflexes: 2+ at both patellar tendons, 2+ at achilles tendons, Babinski's downgoing.  Strength at foot  Plantar-flexion: 5/5 Dorsi-flexion: 5/5 Eversion: 5/5 Inversion: 5/5  Leg strength  Quad: 5/5 Hamstring: 5/5 Hip flexor: 5/5 Hip abductors: 5/5  Gait unremarkable.     Impression and Recommendations:     This case required medical decision making of moderate complexity.      Note: This dictation was prepared with Dragon dictation along with smaller phrase technology. Any transcriptional errors that result from this process are unintentional.

## 2017-11-13 ENCOUNTER — Ambulatory Visit (INDEPENDENT_AMBULATORY_CARE_PROVIDER_SITE_OTHER): Payer: 59 | Admitting: Family Medicine

## 2017-11-13 ENCOUNTER — Encounter: Payer: Self-pay | Admitting: Family Medicine

## 2017-11-13 DIAGNOSIS — M62830 Muscle spasm of back: Secondary | ICD-10-CM | POA: Diagnosis not present

## 2017-11-13 MED ORDER — TRAZODONE HCL 50 MG PO TABS: 25.0000 mg | ORAL_TABLET | Freq: Every evening | ORAL | 3 refills | Status: DC | PRN

## 2017-11-13 MED ORDER — PREDNISONE 50 MG PO TABS: 50.0000 mg | ORAL_TABLET | Freq: Every day | ORAL | 0 refills | Status: DC

## 2017-11-13 MED ORDER — CYCLOBENZAPRINE HCL 10 MG PO TABS: 10.0000 mg | ORAL_TABLET | Freq: Three times a day (TID) | ORAL | 0 refills | Status: DC | PRN

## 2017-11-13 MED FILL — predniSONE 50 MG TABS: 50 | 7 days supply | Qty: 7 | Fill #0

## 2017-11-13 MED FILL — traZODone HCL 50 MG TABS: 50 | 30 days supply | Qty: 30 | Fill #0

## 2017-11-13 MED FILL — CYCLOBENZAPRINE 10 MG TAB: 10 | 10 days supply | Qty: 30 | Fill #0

## 2017-11-13 NOTE — Patient Instructions (Signed)
Good to see you  You are doing well  Prednisone daily for 7 days.  Continue the gabapentin  Trazadone 1/2 tab to full tab at night Flexeril if you need it.  Ice 20 minutes 2 times daily. Usually after activity and before bed. Foam roller the long way  See me again in 1-2 weeks and we will restart manipulation  Discuss the possible change of lexapro to effexor to also help with pain

## 2017-11-13 NOTE — Assessment & Plan Note (Signed)
Patient has more of a lumbar spasm.  We discussed with patient in great length.  We discussed the possibility of trigger point injections or potentially manipulation the patient is having worsening symptoms.  Started on prednisone, gabapentin, icing regimen.  We discussed which activities to do which wants to avoid.  Patient will increase activity again and follow-up with me again in 2-3 weeks

## 2017-11-20 ENCOUNTER — Encounter: Payer: Self-pay | Admitting: Podiatry

## 2017-11-20 ENCOUNTER — Ambulatory Visit: Payer: 59 | Admitting: Podiatry

## 2017-11-20 DIAGNOSIS — M722 Plantar fascial fibromatosis: Secondary | ICD-10-CM

## 2017-11-22 NOTE — Progress Notes (Signed)
[name] [address] [place], [place] [address] Phone: [phone] Subjective:    I'm seeing this patient by the request  of:    CC: Back pain follow-up  [id number]  April [name] is a 42 y.o. female coming in with complaint of pain.  Recently diagnosed with an autoimmune disease.  Was having.  7 days, trazodone at night, was to discuss with primary care provider about the possibility of changing Lexapro to Effexor.  Patient states that the prednisone helped alleviate her pain. She said that she is still having pain in her right side of the thoracic spine. Her pain intensifies with movement at the 5 minute duration.  Patient states that it is about 80% better since being prednisone      Past Medical History:  Diagnosis Date  . ALLERGIC RHINITIS   . ANXIETY DEPRESSION   . CONSTIPATION   . DEPRESSION   . Esophageal reflux   . HYPERCHOLESTEROLEMIA   . OBESITY   . Occipital neuralgia   . Palpitations    Past Surgical History:  Procedure Laterality Date  . NO PAST SURGERIES     Social History   Socioeconomic History  . Marital status: Married    Spouse name: None  . Number of children: None  . Years of education: None  . Highest education level: None  Social Needs  . Financial resource strain: None  . Food insecurity - worry: None  . Food insecurity - inability: None  . Transportation needs - medical: None  . Transportation needs - non-medical: None  Occupational History  . None  Tobacco Use  . Smoking status: Never Smoker  . Smokeless tobacco: Never Used  . Tobacco comment: Married, lives with spouse  Substance and Sexual Activity  . Alcohol use: No  . Drug use: No  . Sexual activity: Yes    Partners: Male    Birth control/protection: Inserts    Comment: 1ST INTERCOURSE- 21, PARTNERS- 3   Other Topics Concern  . None  Social History Narrative  . None   Allergies  Allergen Reactions  . Ciprofloxacin    toxicity  . Levaquin [Levofloxacin] Anxiety    Possible CNS side effects. TOXICITY  . Moxifloxacin     TOXICITY   Family History  Problem Relation Age of Onset  . Hypertension Mother   . Diabetes Mother   . Hyperlipidemia Mother   . Breast cancer Mother 47  . Osteoarthritis Mother   . Hyperlipidemia Father   . Coronary artery disease Father        MI age 16  . Hyperlipidemia Brother      Past medical history, social, surgical and family history all reviewed in electronic medical record.  No pertanent information unless stated regarding to the chief complaint.   Review of Systems:Review of systems updated and as accurate as of 11/24/17  No headache, visual changes, nausea, vomiting, diarrhea, constipation, dizziness, abdominal pain, skin rash, fevers, chills, night sweats, weight loss, swollen lymph nodes, body aches, joint swelling,  chest pain, shortness of breath, mood changes.  Positive muscle aches  Objective  Blood pressure 138/86, pulse 88, height 5\' 10"  (1.778 m), weight 226 lb (102.5 kg), SpO2 98 %. Systems examined below as of 11/24/17   General: No apparent distress alert and oriented x3 mood and affect normal, dressed appropriately.  HEENT: Pupils equal, extraocular movements intact  Respiratory: Patient's speak in full sentences and does not appear short of breath  Cardiovascular: No lower extremity edema, non tender, no erythema  Skin: Warm dry intact with no signs of infection or rash on extremities or on axial skeleton.  Abdomen: Soft nontender  Neuro: Cranial nerves II through XII are intact, neurovascularly intact in all extremities with 2+ DTRs and 2+ pulses.  Lymph: No lymphadenopathy of posterior or anterior cervical chain or axillae bilaterally.  Gait normal with good balance and coordination.  MSK:  tender with full range of motion and good stability and symmetric strength and tone of shoulders, elbows, wrist, hip, knee and ankles bilaterally.  Back Exam:    Inspection: Mild increase in discomfort in the thoracolumbar juncture Motion: Flexion 45 deg, Extension 25 deg, Side Bending to 35 deg bilaterally,  Rotation to 45 deg bilaterally  SLR laying: Negative  XSLR laying: Negative  Palpable tenderness: Pain in the thoracolumbar juncture right greater than left. FABER: negative. Sensory change: Gross sensation intact to all lumbar and sacral dermatomes.  Reflexes: 2+ at both patellar tendons, 2+ at achilles tendons, Babinski's downgoing.  Strength at foot  Plantar-flexion: 5/5 Dorsi-flexion: 5/5 Eversion: 5/5 Inversion: 5/5  Leg strength  Quad: 5/5 Hamstring: 5/5 Hip flexor: 5/5 Hip abductors: 5/5  Gait unremarkable.  Osteopathic findings  C6 flexed rotated and side bent left T5 extended rotated and side bent right inhaled rib T9 extended rotated and side bent left L2 flexed rotated and side bent right Sacrum right on right    Impression and Recommendations:     This case required medical decision making of moderate complexity.      Note: This dictation was prepared with Dragon dictation along with smaller phrase technology. Any transcriptional errors that result from this process are unintentional.

## 2017-11-24 ENCOUNTER — Ambulatory Visit (INDEPENDENT_AMBULATORY_CARE_PROVIDER_SITE_OTHER): Payer: 59 | Admitting: Family Medicine

## 2017-11-24 ENCOUNTER — Encounter: Payer: Self-pay | Admitting: Family Medicine

## 2017-11-24 VITALS — BP 138/86 | HR 88 | Ht 70.0 in | Wt 226.0 lb

## 2017-11-24 DIAGNOSIS — M62830 Muscle spasm of back: Secondary | ICD-10-CM

## 2017-11-24 DIAGNOSIS — M999 Biomechanical lesion, unspecified: Secondary | ICD-10-CM | POA: Diagnosis not present

## 2017-11-24 NOTE — Assessment & Plan Note (Signed)
Decision today to treat with OMT was based on Physical Exam  After verbal consent patient was treated with HVLA, ME, FPR techniques in cervical, thoracic, rib, lumbar and sacral areas  Patient tolerated the procedure well with improvement in symptoms  Patient given exercises, stretches and lifestyle modifications  See medications in patient instructions if given  Patient will follow up in 2 weeks

## 2017-11-24 NOTE — Patient Instructions (Addendum)
Good to see you  So much better Duexis 3 times a day for next week  Start the exercises 3 times a week in 2 days See me again in 2 weeks

## 2017-11-24 NOTE — Assessment & Plan Note (Signed)
Spasm is improved.  Was having more pain on the right side.  Has had difficulty with the left side with a slipped rib syndrome.  We discussed posture, ergonomics, home exercises.  Patient will continue with oral anti-inflammatories.  Patient will follow up with me again 2 weeks

## 2017-12-01 ENCOUNTER — Ambulatory Visit: Payer: Self-pay | Admitting: Family Medicine

## 2017-12-02 MED FILL — LEFLUNOMIDE 20 MG TABLET: 20 | 30 days supply | Qty: 30 | Fill #1

## 2017-12-03 DIAGNOSIS — M0589 Other rheumatoid arthritis with rheumatoid factor of multiple sites: Secondary | ICD-10-CM | POA: Diagnosis not present

## 2017-12-08 NOTE — Progress Notes (Signed)
[name] [address] [place], [place] [address] Phone: [phone] Subjective:      CC: back pain follow up   LKG:[id number]  April [name] is a 42 y.o. female coming in with complaint of back pain. She said that right side is worse than the left and the pain is stabbing. She said that the pain goes away after she sits down.  Patient states that is still there over time.  Patient denies any radiation of periods down and did have a benefits initially.  Now having worsening pain.  Does feel that the gabapentin works better during the night.  States that during the day still has a severe sharp pain.      Past Medical History:  Diagnosis Date  . ALLERGIC RHINITIS   . ANXIETY DEPRESSION   . CONSTIPATION   . DEPRESSION   . Esophageal reflux   . HYPERCHOLESTEROLEMIA   . OBESITY   . Occipital neuralgia   . Palpitations    Past Surgical History:  Procedure Laterality Date  . NO PAST SURGERIES     Social History   Socioeconomic History  . Marital status: Married    Spouse name: None  . Number of children: None  . Years of education: None  . Highest education level: None  Social Needs  . Financial resource strain: None  . Food insecurity - worry: None  . Food insecurity - inability: None  . Transportation needs - medical: None  . Transportation needs - non-medical: None  Occupational History  . None  Tobacco Use  . Smoking status: Never Smoker  . Smokeless tobacco: Never Used  . Tobacco comment: Married, lives with spouse  Substance and Sexual Activity  . Alcohol use: No  . Drug use: No  . Sexual activity: Yes    Partners: Male    Birth control/protection: Inserts    Comment: 1ST INTERCOURSE- 21, PARTNERS- 3   Other Topics Concern  . None  Social History Narrative  . None   Allergies  Allergen Reactions  . Ciprofloxacin     toxicity  . Levaquin [Levofloxacin] Anxiety    Possible CNS side effects. TOXICITY  .  Moxifloxacin     TOXICITY   Family History  Problem Relation Age of Onset  . Hypertension Mother   . Diabetes Mother   . Hyperlipidemia Mother   . Breast cancer Mother 28  . Osteoarthritis Mother   . Hyperlipidemia Father   . Coronary artery disease Father        MI age 67  . Hyperlipidemia Brother      Past medical history, social, surgical and family history all reviewed in electronic medical record.  No pertanent information unless stated regarding to the chief complaint.   Review of Systems:Review of systems updated and as accurate as of 12/09/17  No headache, visual changes, nausea, vomiting, diarrhea, constipation, dizziness, abdominal pain, skin rash, fevers, chills, night sweats, weight loss, swollen lymph nodes, body aches, joint swelling, muscle aches, chest pain, shortness of breath, mood changes.   Objective  Blood pressure 128/72, pulse 82, height 5\' 10"  (1.778 m), weight 229 lb (103.9 kg), SpO2 99 %. Systems examined below as of 12/09/17   General: No apparent distress alert and oriented x3 mood and affect normal, dressed appropriately.  HEENT: Pupils equal, extraocular movements intact  Respiratory: Patient's speak in full sentences and does not appear short of breath  Cardiovascular: No lower extremity edema, non tender, no erythema  Skin: Warm dry intact with no signs of infection or rash on extremities or on axial skeleton.  Abdomen: Soft nontender  Neuro: Cranial nerves II through XII are intact, neurovascularly intact in all extremities with 2+ DTRs and 2+ pulses.  Lymph: No lymphadenopathy of posterior or anterior cervical chain or axillae bilaterally.  Gait normal with good balance and coordination.  MSK:  Non tender with full range of motion and good stability and symmetric strength and tone of shoulders, elbows, wrist, hip, knee and ankles bilaterally.  Back Exam:  Inspection: Patient does have some more tenderness over the thoracolumbar juncture on the  right side. Motion: Flexion 45 deg, Extension 45 deg, Side Bending to 45 deg bilaterally,  Rotation to 45 deg bilaterally  SLR laying: Negative  XSLR laying: Negative  Palpable tenderness: Trigger points noted on the periscapular region on the inferior aspect.Marland Kitchen FABER: negative. Sensory change: Gross sensation intact to all lumbar and sacral dermatomes.  Reflexes: 2+ at both patellar tendons, 2+ at achilles tendons, Babinski's downgoing.  Strength at foot  Plantar-flexion: 5/5 Dorsi-flexion: 5/5 Eversion: 5/5 Inversion: 5/5  Leg strength  Quad: 5/5 Hamstring: 5/5 Hip flexor: 5/5 Hip abductors: 5/5  Gait unremarkable.  Osteopathic findings C2 flexed rotated and side bent right C4 flexed rotated and side bent left C7 flexed rotated and side bent left T3 extended rotated and side bent right inhaled third rib T5 extended rotated and side bent left L3 flexed rotated and side bent right Sacrum right on right  Procedure note Verbal consent patient was prepped with alcohol swabs and with a 25-gauge half inch needle injected in 4 distinct trigger points in the right periscapular musculature.  Total of 3 cc of 0.5% Marcaine and 1 cc of Kenalog 40 mg/mL used.  No blood loss.  Band-Aid placed.  Postinjection instructions given   Impression and Recommendations:     This case required medical decision making of moderate complexity.      Note: This dictation was prepared with Dragon dictation along with smaller phrase technology. Any transcriptional errors that result from this process are unintentional.

## 2017-12-09 ENCOUNTER — Ambulatory Visit (INDEPENDENT_AMBULATORY_CARE_PROVIDER_SITE_OTHER): Payer: 59 | Admitting: Family Medicine

## 2017-12-09 ENCOUNTER — Encounter: Payer: Self-pay | Admitting: Family Medicine

## 2017-12-09 ENCOUNTER — Ambulatory Visit: Payer: 59 | Admitting: Psychology

## 2017-12-09 VITALS — BP 128/72 | HR 82 | Ht 70.0 in | Wt 229.0 lb

## 2017-12-09 DIAGNOSIS — M999 Biomechanical lesion, unspecified: Secondary | ICD-10-CM

## 2017-12-09 DIAGNOSIS — M25511 Pain in right shoulder: Secondary | ICD-10-CM | POA: Insufficient documentation

## 2017-12-09 DIAGNOSIS — M940 Chondrocostal junction syndrome [Tietze]: Secondary | ICD-10-CM | POA: Diagnosis not present

## 2017-12-09 MED ORDER — GABAPENTIN 100 MG PO CAPS: 100.0000 mg | ORAL_CAPSULE | Freq: Two times a day (BID) | ORAL | 3 refills | Status: DC

## 2017-12-09 MED ORDER — GABAPENTIN 400 MG PO CAPS: 400.0000 mg | ORAL_CAPSULE | Freq: Every day | ORAL | 3 refills | Status: DC

## 2017-12-09 MED FILL — GABAPENTIN 100 MG CAP: 100 | 30 days supply | Qty: 60 | Fill #0

## 2017-12-09 MED FILL — GABAPENTIN 400 MG CAPSULE: 400 | 30 days supply | Qty: 30 | Fill #0

## 2017-12-09 NOTE — Assessment & Plan Note (Signed)
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  Tried some trigger point injections today.  Increase patient's gabapentin to 400 mg's at night as well as 100 mg in the a.m. and p.m.  We discussed icing regimen.  Continues to respond fairly well to osteopathic manipulation.  Follow-up again in 2-3 weeks

## 2017-12-09 NOTE — Assessment & Plan Note (Signed)
Patient given injection today and tolerated the procedure well.  We discussed icing regimen and home exercises.  We discussed which activities of doing which wants to avoid.  Increase activity slowly over the course the next several days.  Do not feel advanced imaging is necessary.

## 2017-12-09 NOTE — Assessment & Plan Note (Signed)
Decision today to treat with OMT was based on Physical Exam  After verbal consent patient was treated with HVLA, ME, FPR techniques in cervical, thoracic, rib, lumbar and sacral areas  Patient tolerated the procedure well with improvement in symptoms  Patient given exercises, stretches and lifestyle modifications  See medications in patient instructions if given  Patient will follow up in 2-3 weeks

## 2017-12-09 NOTE — Patient Instructions (Signed)
Good to see me  Alvera Singh is your friend.   Stay active Gabapentin 400mg  at night and consider 100mg  during the day  See em again in 2 weeks

## 2017-12-15 ENCOUNTER — Ambulatory Visit (INDEPENDENT_AMBULATORY_CARE_PROVIDER_SITE_OTHER): Payer: 59 | Admitting: Psychology

## 2017-12-15 DIAGNOSIS — F4323 Adjustment disorder with mixed anxiety and depressed mood: Secondary | ICD-10-CM

## 2017-12-17 ENCOUNTER — Ambulatory Visit (HOSPITAL_COMMUNITY): Payer: Self-pay | Admitting: Psychiatry

## 2017-12-24 ENCOUNTER — Ambulatory Visit: Payer: 59 | Admitting: Internal Medicine

## 2017-12-24 ENCOUNTER — Encounter: Payer: Self-pay | Admitting: Internal Medicine

## 2017-12-24 VITALS — BP 128/70 | HR 85 | Temp 97.8°F | Ht 70.0 in | Wt 227.0 lb

## 2017-12-24 DIAGNOSIS — J019 Acute sinusitis, unspecified: Secondary | ICD-10-CM | POA: Diagnosis not present

## 2017-12-24 DIAGNOSIS — R739 Hyperglycemia, unspecified: Secondary | ICD-10-CM | POA: Diagnosis not present

## 2017-12-24 DIAGNOSIS — M069 Rheumatoid arthritis, unspecified: Secondary | ICD-10-CM | POA: Diagnosis not present

## 2017-12-24 DIAGNOSIS — R21 Rash and other nonspecific skin eruption: Secondary | ICD-10-CM | POA: Diagnosis not present

## 2017-12-24 HISTORY — DX: Rheumatoid arthritis, unspecified: M06.9

## 2017-12-24 MED ORDER — AMOXICILLIN-POT CLAVULANATE 875-125 MG PO TABS: 1.0000 | ORAL_TABLET | Freq: Two times a day (BID) | ORAL | 0 refills | Status: DC

## 2017-12-24 MED ORDER — PREDNISONE 10 MG PO TABS: ORAL_TABLET | ORAL | 1 refills | Status: DC

## 2017-12-24 MED FILL — predniSONE 10 MG TABS: 10 | 5 days supply | Qty: 10 | Fill #0

## 2017-12-24 MED FILL — AMOX-CLAV 875-125 MG TABLET: 875-125 | 10 days supply | Qty: 20 | Fill #0

## 2017-12-24 NOTE — Progress Notes (Signed)
Subjective:    [name], female    DOB: 05-29-1976, 42 y.o.   MRN: [id number]  HPI   Here with 2-3 days acute onset fever, facial pain, pressure, headache, general weakness and malaise, and greenish d/c, with mild ST and cough, but pt denies chest pain, wheezing, increased sob or doe, orthopnea, PND, increased LE swelling, palpitations, dizziness or syncope. Also incidentally with a markedly itchy erythem rash to both inner elbows, upper mid chest and post bilat upper legs without swelling or wheezing.  Now with recent dx atypical RA, has very recent start of Ariva but not clear if improved yet,  Pt denies new neurological symptoms such as new headache, or facial or extremity weakness or numbness   Pt denies polydipsia, polyuria Past Medical History:  Diagnosis Date  . ALLERGIC RHINITIS   . ANXIETY DEPRESSION   . CONSTIPATION   . DEPRESSION   . Esophageal reflux   . HYPERCHOLESTEROLEMIA   . OBESITY   . Occipital neuralgia   . Palpitations   . Rheumatoid arthritis ([place]) 12/24/2017   Past Surgical History:  Procedure Laterality Date  . NO PAST SURGERIES      reports that  has never smoked. she has never used smokeless tobacco. She reports that she does not drink alcohol or use drugs. family history includes Breast cancer (age of onset: 66) in her mother; Coronary artery disease in her father; Diabetes in her mother; Hyperlipidemia in her brother, father, and mother; Hypertension in her mother; Osteoarthritis in her mother. Allergies  Allergen Reactions  . Ciprofloxacin     toxicity  . Levaquin [Levofloxacin] Anxiety    Possible CNS side effects. TOXICITY  . Moxifloxacin     TOXICITY   Current Outpatient Medications on File Prior to Visit  Medication Sig Dispense Refill  . ascorbic acid (VITAMIN C) 1000 MG tablet Take 1,000 mg by mouth daily.    . Ashwagandha 500 MG CAPS Take 500 mg by mouth. Take 600 mg by mouth at bedtime.    . busPIRone (BUSPAR) 15 MG  tablet 1 bid 60 tablet 5  . cetirizine (ZYRTEC) 10 MG tablet Take 1 tablet (10 mg total) by mouth daily. 30 tablet 3  . cholecalciferol (VITAMIN D) 1000 units tablet Take 5,000 Units by mouth daily.    . clonazePAM (KLONOPIN) 1 MG tablet Take 1 mg by mouth daily as needed for anxiety (take half a tablet as needed).    . Coenzyme Q10 (COQ-10) 200 MG CAPS Take 2 capsules by mouth daily. 400mg  daily.    Lovell Sheehan Thistle (LIVER & KIDNEY CLEANSER PO) Take by mouth 3 (three) times daily.    . cyclobenzaprine (FLEXERIL) 10 MG tablet Take 1 tablet (10 mg total) by mouth 3 (three) times daily as needed for muscle spasms. 30 tablet 0  . doxylamine, Sleep, (UNISOM) 25 MG tablet Take 25 mg by mouth at bedtime as needed. Taka half a tablet as needed    . escitalopram (LEXAPRO) 20 MG tablet Take 1 tablet (20 mg total) by mouth daily. 30 tablet 4  . esomeprazole (NEXIUM) 40 MG capsule Take 1 capsule (40 mg total) by mouth daily. 90 capsule 3  . etonogestrel-ethinyl estradiol (NUVARING) 0.12-0.015 MG/24HR vaginal ring Continuous use.  Remove and insert new ring every 4 weeks. 3 each 4  . gabapentin (NEURONTIN) 100 MG capsule Take 1 capsule (100 mg total) by mouth 2 (two) times daily. 60 capsule 3  . gabapentin (NEURONTIN) 400 MG  capsule Take 1 capsule (400 mg total) by mouth at bedtime. 30 capsule 3  . Ibuprofen-Famotidine (DUEXIS) 800-26.6 MG TABS Take 800 mg by mouth 3 (three) times daily. 90 tablet 3  . leflunomide (ARAVA) 20 MG tablet Take 20 mg by mouth daily.    . meclizine (ANTIVERT) 25 MG tablet Take 25 mg by mouth. Every 4-6 hours as needed.    . montelukast (SINGULAIR) 10 MG tablet Take 1 tablet (10 mg total) by mouth at bedtime. 90 tablet 3  . Multiple Vitamin (MULTIVITAMIN) tablet Take 1 tablet by mouth daily.      . Omega-3 Fatty Acids (FISH OIL) 1000 MG CAPS Take 2,000 mg by mouth 2 (two) times daily.     . ondansetron (ZOFRAN) 4 MG tablet Take 4 mg by mouth every 8 (eight) hours as needed  for nausea or vomiting. 4-6 hours PRN    . Probiotic Product (SOLUBLE FIBER/PROBIOTICS PO) Take 2 capsules by mouth 2 (two) times daily.    . propranolol (INDERAL) 10 MG tablet 1 bid 60 tablet 5  . traZODone (DESYREL) 50 MG tablet Take 0.5-1 tablets (25-50 mg total) by mouth at bedtime as needed for sleep. 30 tablet 3  . vitamin E 400 UNIT capsule Take 400 Units by mouth daily.    Marland Kitchen zolpidem (AMBIEN) 5 MG tablet Take 1 tablet (5 mg total) by mouth at bedtime as needed for sleep. 30 tablet 2   No current facility-administered medications on file prior to visit.    Review of Systems  Constitutional: Negative for other unusual diaphoresis or sweats HENT: Negative for ear discharge or swelling Eyes: Negative for other worsening visual disturbances Respiratory: Negative for stridor or other swelling  Gastrointestinal: Negative for worsening distension or other blood Genitourinary: Negative for retention or other urinary change Musculoskeletal: Negative for other MSK pain or swelling Skin: Negative for color change or other new lesions Neurological: Negative for worsening tremors and other numbness  Psychiatric/Behavioral: Negative for worsening agitation or other fatigue All other system neg per pt    Objective:   Physical Exam BP 128/70 (BP Location: Left Arm, Patient Position: Sitting, Cuff Size: Large)   Pulse 85   Temp 97.8 F (36.6 C) (Oral)   Ht 5\' 10"  (1.778 m)   Wt 227 lb (103 kg)   SpO2 99%   BMI 32.57 kg/m  VS noted, mild ill Constitutional: Pt appears in NAD HENT: Head: NCAT.  Right Ear: External ear normal.  Left Ear: External ear normal.  Eyes: . Pupils are equal, round, and reactive to light. Conjunctivae and EOM are normal Bilat tm's with mild erythema.  Max sinus areas mild tender.  Pharynx with mild erythema, no exudate  Nose: without d/c or deformity Neck: Neck supple. Gross normal ROM Cardiovascular: Normal rate and regular rhythm.   Pulmonary/Chest: Effort  normal and breath sounds without rales or wheezing.  + mild tender right sternoclavicular joint Neurological: Pt is alert. At baseline orientation, motor grossly intact Skin: Skin is warm. + nontender erythem rash with areas of fine red lesions very slightly raised to inner elbows, upper mid chest and post legs, no other new lesions, no LE edema Psychiatric: Pt behavior is normal without agitation  No other exam findings    Assessment & Plan:

## 2017-12-24 NOTE — Patient Instructions (Addendum)
Please take all new medication as prescribed - the antibiotic, and prednisone  Please continue all other medications as before, and refills have been done if requested.  Please have the pharmacy call with any other refills you may need.  Please keep your appointments with your specialists as you may have planned      

## 2017-12-25 DIAGNOSIS — J019 Acute sinusitis, unspecified: Secondary | ICD-10-CM | POA: Insufficient documentation

## 2017-12-25 NOTE — Assessment & Plan Note (Signed)
C/w allergic type, etiology not clear, for predpac asd,  to f/u any worsening symptoms or concerns

## 2017-12-25 NOTE — Assessment & Plan Note (Signed)
On new ariva, likely to improved with predpac asd,  to f/u any worsening symptoms or concerns

## 2017-12-25 NOTE — Assessment & Plan Note (Signed)
stable overall by history and exam, recent data reviewed with pt, and pt to continue medical treatment as before,  to f/u any worsening symptoms or concerns Lab Results  Component Value Date   HGBA1C 5.8 05/06/2017   

## 2017-12-25 NOTE — Assessment & Plan Note (Signed)
Mild to mod, for antibx course,  to f/u any worsening symptoms or concerns 

## 2017-12-26 ENCOUNTER — Encounter: Payer: Self-pay | Admitting: Family Medicine

## 2017-12-26 ENCOUNTER — Ambulatory Visit (INDEPENDENT_AMBULATORY_CARE_PROVIDER_SITE_OTHER): Payer: 59 | Admitting: Family Medicine

## 2017-12-26 ENCOUNTER — Ambulatory Visit: Payer: 59 | Admitting: Podiatry

## 2017-12-26 VITALS — BP 144/80 | HR 81 | Ht 70.0 in | Wt 227.0 lb

## 2017-12-26 DIAGNOSIS — M999 Biomechanical lesion, unspecified: Secondary | ICD-10-CM | POA: Diagnosis not present

## 2017-12-26 DIAGNOSIS — H47333 Pseudopapilledema of optic disc, bilateral: Secondary | ICD-10-CM | POA: Diagnosis not present

## 2017-12-26 DIAGNOSIS — M940 Chondrocostal junction syndrome [Tietze]: Secondary | ICD-10-CM | POA: Diagnosis not present

## 2017-12-26 MED ORDER — PREDNISONE 50 MG PO TABS: 50.0000 mg | ORAL_TABLET | Freq: Every day | ORAL | 0 refills | Status: DC

## 2017-12-26 MED ORDER — NITROGLYCERIN 0.2 MG/HR TD PT24: MEDICATED_PATCH | TRANSDERMAL | 1 refills | Status: DC

## 2017-12-26 MED FILL — predniSONE 50 MG TABS: 50 | 5 days supply | Qty: 5 | Fill #0

## 2017-12-26 MED FILL — NITROGLYCERIN 0.2 MG/HR PTC: 0.2 | 28 days supply | Qty: 7 | Fill #0

## 2017-12-26 NOTE — Assessment & Plan Note (Signed)
Decision today to treat with OMT was based on Physical Exam  After verbal consent patient was treated with HVLA, ME, FPR techniques in cervical, thoracic, rib, lumbar and sacral areas  Patient tolerated the procedure well with improvement in symptoms  Patient given exercises, stretches and lifestyle modifications  See medications in patient instructions if given  Patient will follow up in 4-6 weeks 

## 2017-12-26 NOTE — Progress Notes (Signed)
[name] [address] [place], [place] [address] Phone: [phone] Subjective:     CC: Back pain follow-up  [name]  April [name] is a 42 y.o. female coming in with complaint of back pain.  Has had upper back pain, neck pain, as well as low back pain over the course of time.  Has responded fairly well to osteopathic manipulation.  Patient is still having chronic pain.  Seems to be in the same area.  Right-sided scapular region.  Feels that she is not responding to the medications from the rheumatologist.        Past Medical History:  Diagnosis Date  . ALLERGIC RHINITIS   . ANXIETY DEPRESSION   . CONSTIPATION   . DEPRESSION   . Esophageal reflux   . HYPERCHOLESTEROLEMIA   . OBESITY   . Occipital neuralgia   . Palpitations   . Rheumatoid arthritis ([place]) 12/24/2017   Past Surgical History:  Procedure Laterality Date  . NO PAST SURGERIES     Social History   Socioeconomic History  . Marital status: Married    Spouse name: Not on file  . Number of children: Not on file  . Years of education: Not on file  . Highest education level: Not on file  Social Needs  . Financial resource strain: Not on file  . Food insecurity - worry: Not on file  . Food insecurity - inability: Not on file  . Transportation needs - medical: Not on file  . Transportation needs - non-medical: Not on file  Occupational History  . Not on file  Tobacco Use  . Smoking status: Never Smoker  . Smokeless tobacco: Never Used  . Tobacco comment: Married, lives with spouse  Substance and Sexual Activity  . Alcohol use: No  . Drug use: No  . Sexual activity: Yes    Partners: Male    Birth control/protection: Inserts    Comment: 1ST INTERCOURSE- 21, PARTNERS- 3   Other Topics Concern  . Not on file  Social History Narrative  . Not on file   Allergies  Allergen Reactions  . Ciprofloxacin     toxicity  . Levaquin [Levofloxacin] Anxiety    Possible  CNS side effects. TOXICITY  . Moxifloxacin     TOXICITY   Family History  Problem Relation Age of Onset  . Hypertension Mother   . Diabetes Mother   . Hyperlipidemia Mother   . Breast cancer Mother 46  . Osteoarthritis Mother   . Hyperlipidemia Father   . Coronary artery disease Father        MI age 44  . Hyperlipidemia Brother      Past medical history, social, surgical and family history all reviewed in electronic medical record.  No pertanent information unless stated regarding to the chief complaint.   Review of Systems:Review of systems updated and as accurate as of 12/26/17  No headache, visual changes, nausea, vomiting, diarrhea, constipation, dizziness, abdominal pain, skin rash, fevers, chills, night sweats, weight loss, swollen lymph nodes, body aches, , chest pain, shortness of breath, mood changes.  Positive muscle aches and joint swelling  Objective  There were no vitals taken for this visit. Systems examined below as of 12/26/17   General: No apparent distress alert and oriented x3 mood and affect normal, dressed appropriately.  HEENT: Pupils equal, extraocular movements intact  Respiratory: Patient's speak in full sentences and does not appear short of breath  Cardiovascular: No lower extremity edema,  non tender, no erythema  Skin: Warm dry intact with no signs of infection or rash on extremities or on axial skeleton.  Abdomen: Soft nontender  Neuro: Cranial nerves II through XII are intact, neurovascularly intact in all extremities with 2+ DTRs and 2+ pulses.  Lymph: No lymphadenopathy of posterior or anterior cervical chain or axillae bilaterally.  Gait normal with good balance and coordination.  MSK:  Non tender with full range of motion and good stability and symmetric strength and tone of shoulders, elbows, wrist, hip, knee and ankles bilaterally.  Back Exam:  Inspection: Unremarkable  Motion: Flexion 45 deg, Extension 25 deg, Side Bending to 45 deg  bilaterally,  Rotation to 45 deg bilaterally  SLR laying: Negative  XSLR laying: Negative  Palpable tenderness: Tender to palpation in the paraspinal musculature more in the thoracolumbar juncture.  More pain on the right side as well as the scapula. FABER: negative. Sensory change: Gross sensation intact to all lumbar and sacral dermatomes.  Reflexes: 2+ at both patellar tendons, 2+ at achilles tendons, Babinski's downgoing.  Strength at foot  Plantar-flexion: 5/5 Dorsi-flexion: 5/5 Eversion: 5/5 Inversion: 5/5  Leg strength  Quad: 5/5 Hamstring: 5/5 Hip flexor: 5/5 Hip abductors: 5/5  Gait unremarkable.  Osteopathic findings Cervical C2 flexed rotated and side bent right C6 flexed rotated and side bent right T3 extended rotated and side bent right inhaled third rib T6 extended rotated and side bent left L2 flexed rotated and side bent right Sacrum right on right    Impression and Recommendations:     This case required medical decision making of moderate complexity.      Note: This dictation was prepared with Dragon dictation along with smaller phrase technology. Any transcriptional errors that result from this process are unintentional.

## 2017-12-26 NOTE — Assessment & Plan Note (Signed)
Believe the patient continues to have difficulty with a slipped rib syndrome.  Discussed with patient about icing regimen and home exercises.  Has had x-rays previously that did not show any erosive changes.  Patient is on prednisone at this point but will increase to 50 mg from rheumatology in the next 2 weeks.  Responded well to osteopathic manipulation.  Following up again in 4-6 weeks

## 2017-12-26 NOTE — Patient Instructions (Signed)
Good to see you  I am sorry your are hurting so.  Watch about food.  Increase prednsone to 50 mg daily for 5 days I still think the rheumatoid is still the concern.  Stay active.  nitroglycerin Protocol   Apply 1/4 nitroglycerin patch to affected area daily.  Change position of patch within the affected area every 24 hours.  You may experience a headache during the first 1-2 weeks of using the patch, these should subside.  If you experience headaches after beginning nitroglycerin patch treatment, you may take your preferred over the counter pain reliever.  Another side effect of the nitroglycerin patch is skin irritation or rash related to patch adhesive.  Please notify our office if you develop more severe headaches or rash, and stop the patch.  Tendon healing with nitroglycerin patch may require 12 to 24 weeks depending on the extent of injury.  Men should not use if taking Viagra, Cialis, or Levitra.   Do not use if you have migraines or rosacea.   See me again in 4 weeks.

## 2017-12-29 ENCOUNTER — Encounter: Payer: Self-pay | Admitting: Family Medicine

## 2017-12-29 MED ORDER — COLCHICINE 0.6 MG PO TABS: 0.6000 mg | ORAL_TABLET | Freq: Two times a day (BID) | ORAL | 2 refills | Status: DC

## 2017-12-29 MED FILL — COLCHICINE 0.6 MG TABS: 0.6 | 30 days supply | Qty: 60 | Fill #0

## 2018-01-05 MED FILL — GABAPENTIN 100 MG CAP: 100 | 30 days supply | Qty: 60 | Fill #1

## 2018-01-05 MED FILL — GABAPENTIN 400 MG CAPSULE: 400 | 30 days supply | Qty: 30 | Fill #1

## 2018-01-05 MED FILL — LEFLUNOMIDE 20 MG TABLET: 20 | 30 days supply | Qty: 30 | Fill #2

## 2018-01-06 DIAGNOSIS — Z6833 Body mass index (BMI) 33.0-33.9, adult: Secondary | ICD-10-CM | POA: Diagnosis not present

## 2018-01-06 DIAGNOSIS — R768 Other specified abnormal immunological findings in serum: Secondary | ICD-10-CM | POA: Diagnosis not present

## 2018-01-06 DIAGNOSIS — Z8709 Personal history of other diseases of the respiratory system: Secondary | ICD-10-CM | POA: Diagnosis not present

## 2018-01-06 DIAGNOSIS — E669 Obesity, unspecified: Secondary | ICD-10-CM | POA: Diagnosis not present

## 2018-01-06 DIAGNOSIS — M0589 Other rheumatoid arthritis with rheumatoid factor of multiple sites: Secondary | ICD-10-CM | POA: Diagnosis not present

## 2018-01-06 DIAGNOSIS — M2550 Pain in unspecified joint: Secondary | ICD-10-CM | POA: Diagnosis not present

## 2018-01-08 ENCOUNTER — Telehealth: Payer: Self-pay | Admitting: Pharmacist

## 2018-01-08 NOTE — Telephone Encounter (Signed)
Called patient to schedule an appointment for the [facility]. I was unable to reach the patient so I left a HIPAA-compliant message requesting that the patient return my call.   

## 2018-01-09 ENCOUNTER — Ambulatory Visit (INDEPENDENT_AMBULATORY_CARE_PROVIDER_SITE_OTHER): Payer: 59 | Admitting: Psychiatry

## 2018-01-09 ENCOUNTER — Encounter (HOSPITAL_COMMUNITY): Payer: Self-pay | Admitting: Psychiatry

## 2018-01-09 VITALS — BP 138/84 | HR 78 | Ht 70.0 in | Wt 231.0 lb

## 2018-01-09 DIAGNOSIS — F419 Anxiety disorder, unspecified: Secondary | ICD-10-CM

## 2018-01-09 DIAGNOSIS — F3340 Major depressive disorder, recurrent, in remission, unspecified: Secondary | ICD-10-CM

## 2018-01-09 DIAGNOSIS — F320 Major depressive disorder, single episode, mild: Secondary | ICD-10-CM

## 2018-01-09 DIAGNOSIS — Z79899 Other long term (current) drug therapy: Secondary | ICD-10-CM | POA: Diagnosis not present

## 2018-01-09 DIAGNOSIS — M069 Rheumatoid arthritis, unspecified: Secondary | ICD-10-CM

## 2018-01-09 MED ORDER — ESCITALOPRAM OXALATE 20 MG PO TABS: 20.0000 mg | ORAL_TABLET | Freq: Every day | ORAL | 4 refills | Status: DC

## 2018-01-09 MED ORDER — BUSPIRONE HCL 15 MG PO TABS: ORAL_TABLET | ORAL | 5 refills | Status: DC

## 2018-01-09 MED FILL — busPIRone HCL 15 MG TABS: 15 | 30 days supply | Qty: 90 | Fill #0

## 2018-01-09 MED FILL — ESCITALOPRAM 20 MG TABLET: 20 | 30 days supply | Qty: 30 | Fill #0

## 2018-01-09 NOTE — Progress Notes (Signed)
[facility] MD Progress Note  01/09/2018 8:59 AM April [name]  MRN:  [id number] Subjective: Doing great Principal Problem: Major depression, recurrent, remission Diagnosis:  Major depression, recurrent, remission  Today the patient is better. She is less anxious. She recently started on Humeria for her rheumatoid arthritis. Her pain is less. In fact she says she's had a dramatic response to the medicine she says she's only been on 3 days but her pain is remarkably less so. It is not limiting. She never had any problems with her hands mainly in her upper extremities. Patient is in good spirits. She's a good relationship with her husband. Now the way she feels physically healthy. Financially she is very stable. She continues to see her therapist once a month. She's now taking the BuSpar on a regular basis 15 mg twice a day She says he clearly is helped. She says that has decreased her anxiety by 30%. She continues to take Lexapro and seems to be helpful.One of her pain doctors wanted her to add Effexor but I recommended against it. They wanted added for some back pain. At this time the patient denies the use of alcohol or drugs. She can think and concentrate without problems. She's having no problems working. She is pleased that she's gotten good response from the medicines for rheumatoid arthritis. Patient Active Problem List   Diagnosis Date Noted  . Acute sinus infection [J01.90] 12/25/2017  . Rheumatoid arthritis ([place]) [M06.9] 12/24/2017  . Trigger point of shoulder region, right [M25.511] 12/09/2017  . Lumbar paraspinal muscle spasm [M62.830] 11/13/2017  . Cervical radicular pain [M54.12] 09/18/2017  . Poor posture [R29.3] 08/14/2017  . Left otitis media [H66.92] 01/07/2017  . Eustachian tube dysfunction [H69.80] 01/07/2017  . Chronic sinusitis [J32.9] 01/07/2017  . N&V (nausea and vomiting) [R11.2] 12/31/2016  . Vertigo [R42] 12/31/2016  . 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  Nonallopathic lesion of thoracic region [M99.9] 06/03/2016  . Nonallopathic lesion-rib cage [M99.9] 06/03/2016  . Nonallopathic lesion of cervical region [M99.9] 06/03/2016  . Rash [R21] 05/22/2016  . Acute left-sided thoracic back pain [M54.6] 05/22/2016  . Hyperglycemia [R73.9] 05/03/2016  . Preventative health care [Z00.00] 05/04/2015  . Left hamstring injury [S76.302A] 01/24/2015  . Major depressive disorder, recurrent episode, moderate ([place]) [F33.1] 12/02/2014  . Occipital neuralgia [M54.81]   . ANEMIA-NOS [D64.9] 12/11/2009  . Hyperlipidemia [E78.5] 11/16/2008  . Obesity [E66.9] 11/16/2008  . ANXIETY DEPRESSION [F34.1] 11/16/2008  . Allergic rhinitis [J30.9] 11/16/2008  . Esophageal reflux [K21.9] 11/16/2008  . Constipation [K59.00] 11/16/2008  . Palpitations [R00.2] 11/16/2008   Total Time spent with patient: 30 minutes  Past Psychiatric History:   Past Medical History:  Past Medical History:  Diagnosis Date  . ALLERGIC RHINITIS   . ANXIETY DEPRESSION   . CONSTIPATION   . DEPRESSION   . Esophageal reflux   . HYPERCHOLESTEROLEMIA   . OBESITY   . Occipital neuralgia   . Palpitations   . Rheumatoid arthritis ([place]) 12/24/2017    Past Surgical History:  Procedure Laterality Date  . NO PAST SURGERIES     Family History:  Family History  Problem Relation Age of Onset  . Hypertension Mother   . Diabetes Mother   . Hyperlipidemia Mother   . Breast cancer Mother 16  . Osteoarthritis Mother   . Hyperlipidemia Father   . Coronary artery disease Father        MI age 37  . Hyperlipidemia Brother    Family Psychiatric  History:  Social History:  Social History   Substance and Sexual Activity  Alcohol Use No     Social History   Substance and Sexual Activity  Drug Use No    Social History   Socioeconomic History  . Marital status: Married    Spouse name: None  . Number of children: None  . Years of education: None  . Highest education level: None  Social  Needs  . Financial resource strain: None  . Food insecurity - worry: None  . Food insecurity - inability: None  . Transportation needs - medical: None  . Transportation needs - non-medical: None  Occupational History  . None  Tobacco Use  . Smoking status: Never Smoker  . Smokeless tobacco: Never Used  . Tobacco comment: Married, lives with spouse  Substance and Sexual Activity  . Alcohol use: No  . Drug use: No  . Sexual activity: Yes    Partners: Male    Birth control/protection: Inserts    Comment: 1ST INTERCOURSE- 21, PARTNERS- 3   Other Topics Concern  . None  Social History Narrative  . None   Additional Social History:                         Sleep: Good  Appetite:  Good  Current Medications: Current Outpatient Medications  Medication Sig Dispense Refill  . Adalimumab (HUMIRA) 40 MG/0.4ML PSKT Inject into the skin.    Marland Kitchen ascorbic acid (VITAMIN C) 1000 MG tablet Take 1,000 mg by mouth daily.    . busPIRone (BUSPAR) 15 MG tablet 1  qam   2  At  6;00 90 tablet 5  . cetirizine (ZYRTEC) 10 MG tablet Take 1 tablet (10 mg total) by mouth daily. 30 tablet 3  . cholecalciferol (VITAMIN D) 1000 units tablet Take 5,000 Units by mouth daily.    . clonazePAM (KLONOPIN) 1 MG tablet Take 1 mg by mouth daily as needed for anxiety (take half a tablet as needed).    . Coenzyme Q10 (COQ-10) 200 MG CAPS Take 2 capsules by mouth daily. 400mg  daily.    . colchicine 0.6 MG tablet Take 1 tablet (0.6 mg total) by mouth 2 (two) times daily. 60 tablet 2  . cyclobenzaprine (FLEXERIL) 10 MG tablet Take 1 tablet (10 mg total) by mouth 3 (three) times daily as needed for muscle spasms. 30 tablet 0  . dextromethorphan-guaiFENesin (MUCINEX DM) 30-600 MG 12hr tablet Take 1 tablet by mouth 2 (two) times daily.    Marland Kitchen escitalopram (LEXAPRO) 20 MG tablet Take 1 tablet (20 mg total) by mouth daily. 30 tablet 4  . esomeprazole (NEXIUM) 40 MG capsule Take 1 capsule (40 mg total) by mouth daily. 90  capsule 3  . etonogestrel-ethinyl estradiol (NUVARING) 0.12-0.015 MG/24HR vaginal ring Continuous use.  Remove and insert new ring every 4 weeks. 3 each 4  . gabapentin (NEURONTIN) 100 MG capsule Take 1 capsule (100 mg total) by mouth 2 (two) times daily. 60 capsule 3  . gabapentin (NEURONTIN) 400 MG capsule Take 1 capsule (400 mg total) by mouth at bedtime. 30 capsule 3  . Ibuprofen-Famotidine (DUEXIS) 800-26.6 MG TABS Take 800 mg by mouth 3 (three) times daily. 90 tablet 3  . leflunomide (ARAVA) 20 MG tablet Take 20 mg by mouth daily.    . meclizine (ANTIVERT) 25 MG tablet Take 25 mg by mouth. Every 4-6 hours as needed.    . montelukast (SINGULAIR) 10 MG tablet Take 1 tablet (  10 mg total) by mouth at bedtime. 90 tablet 3  . Multiple Vitamin (MULTIVITAMIN) tablet Take 1 tablet by mouth daily.      . nitroGLYCERIN (NITRODUR - DOSED IN MG/24 HR) 0.2 mg/hr patch 1/4 patch daily 30 patch 1  . Omega-3 Fatty Acids (FISH OIL) 1000 MG CAPS Take 2,000 mg by mouth 2 (two) times daily.     . ondansetron (ZOFRAN) 4 MG tablet Take 4 mg by mouth every 8 (eight) hours as needed for nausea or vomiting. 4-6 hours PRN    . Probiotic Product (SOLUBLE FIBER/PROBIOTICS PO) Take 2 capsules by mouth 2 (two) times daily.    . traZODone (DESYREL) 50 MG tablet Take 0.5-1 tablets (25-50 mg total) by mouth at bedtime as needed for sleep. 30 tablet 3  . vitamin E 400 UNIT capsule Take 400 Units by mouth daily.    Marland Kitchen zolpidem (AMBIEN) 5 MG tablet Take 1 tablet (5 mg total) by mouth at bedtime as needed for sleep. 30 tablet 2  . amoxicillin-clavulanate (AUGMENTIN) 875-125 MG tablet Take 1 tablet by mouth 2 (two) times daily. (Patient not taking: Reported on 01/09/2018) 20 tablet 0  . Ashwagandha 500 MG CAPS Take 500 mg by mouth. Take 600 mg by mouth at bedtime.    Lovell Sheehan Thistle (LIVER & KIDNEY CLEANSER PO) Take by mouth 3 (three) times daily.    Marland Kitchen doxylamine, Sleep, (UNISOM) 25 MG tablet Take 25 mg by mouth at bedtime  as needed. Taka half a tablet as needed    . predniSONE (DELTASONE) 10 MG tablet 2 tabs by mouth per day for 5 days (Patient not taking: Reported on 01/09/2018) 10 tablet 1  . predniSONE (DELTASONE) 50 MG tablet Take 1 tablet (50 mg total) by mouth daily. (Patient not taking: Reported on 01/09/2018) 5 tablet 0  . propranolol (INDERAL) 10 MG tablet 1 bid (Patient not taking: Reported on 01/09/2018) 60 tablet 5   No current facility-administered medications for this visit.     Lab Results:  No results found for this or any previous visit (from the past 48 hour(s)).  Blood Alcohol level:  No results found for: [facility]  Physical Findings: AIMS:  , ,  ,  ,    CIWA:    COWS:     Musculoskeletal: Strength & Muscle Tone: within normal limits Gait & Station: normal Patient leans: N/A  Psychiatric Specialty Exam: ROS  Blood pressure 138/84, pulse 78, height 5\' 10"  (1.778 m), weight 231 lb (104.8 kg).Body mass index is 33.15 kg/m.  General Appearance: Casual  Eye Contact::  Good  Speech:  Clear and Coherent  Volume:  Normal  Mood:  Euthymic  Affect:  Appropriate  Thought Process:  Coherent  Orientation:  Full (Time, Place, and Person)  Thought Content:  WDL  Suicidal Thoughts:  No  Homicidal Thoughts:  No  Memory:  NA  Judgement:  Good  Insight:  Good  Psychomotor Activity:  Normal  Concentration:  Good  Recall:  Good  Fund of Knowledge:Good  Language: Good  Akathisia:  No  Handed:  Right  AIMS (if indicated):     Assets:  Desire for Improvement  ADL's:  Intact  Cognition: WNL  Sleep:      Treatment Plan Summary:  t this time the patient is better. I think she has less anxiety because of the consistent use of BuSpar. Given that she only has a 30% response alcohol ahead and increase her BuSpar to taking 15 mg in  the morning and 30 mg later in the day. She'll continue taking Lexapro 20 mg. She'll continue Advair. Her significant psychosocial stressor is her pain from rheumatoid  arthritis now appears to be better.. The patient denies the use of alcohol or drugs. She is functioning very well. She'll return to see me in approximately 2-3 months.

## 2018-01-12 ENCOUNTER — Ambulatory Visit (HOSPITAL_BASED_OUTPATIENT_CLINIC_OR_DEPARTMENT_OTHER): Payer: 59 | Admitting: Pharmacist

## 2018-01-12 DIAGNOSIS — Z79899 Other long term (current) drug therapy: Secondary | ICD-10-CM

## 2018-01-12 MED ORDER — ADALIMUMAB 40 MG/0.8ML ~~LOC~~ AJKT: 1.0000 "pen " | AUTO-INJECTOR | SUBCUTANEOUS | 2 refills | Status: DC

## 2018-01-12 MED FILL — HUMIRA PEN 40 MG/0.8ML PNKT: 40 | 28 days supply | Qty: 2 | Fill #0

## 2018-01-12 NOTE — Progress Notes (Signed)
   S: Patient presents to [facility] for review of their specialty medication therapy.  Patient is currently taking Humira for rheumatoid arthritis. Patient is managed by Marella Chimes for this.   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  Adherence: Had first dose last week in clinic. She is getting ready to give her next injection to herself next week  Efficacy: reports that she has already noticed an improvement and that it started to work in 1 hour and that overall she feels much better.   Dosing:  Rheumatoid arthritis: SubQ: 40 mg every other week (may continue methotrexate, other nonbiologic DMARDS, corticosteroids, NSAIDs, and/or analgesics); patients not taking concomitant methotrexate may increase dose to 40 mg every week  Dose adjustments: Renal: no dose adjustments (has not been studied) Hepatic: no dose adjustments (has not been studied)  Drug-drug interactions: none  Screening: TB test: completed per patient Hepatitis: completed per patient  Monitoring: S/sx of infection: denies CBC: WBC 12, otherwise WNL per patient S/sx of hypersensitivity: denies S/sx of malignancy: denies S/sx of heart failure: denies  O:     Lab Results  Component Value Date   WBC 6.8 05/06/2017   HGB 13.1 05/06/2017   HCT 39.6 05/06/2017   MCV 86.1 05/06/2017   PLT 339.0 05/06/2017      Chemistry      Component Value Date/Time   NA 140 05/06/2017 1622   NA 141 02/21/2014   K 4.3 05/06/2017 1622   CL 106 05/06/2017 1622   CO2 27 05/06/2017 1622   BUN 8 05/06/2017 1622   BUN 13 02/21/2014   CREATININE 0.69 05/06/2017 1622   GLU 93 02/21/2014      Component Value Date/Time   CALCIUM 9.4 09/18/2017 1110   ALKPHOS 58 05/06/2017 1622   AST 12 05/06/2017 1622   ALT 13 05/06/2017 1622   BILITOT 0.3 05/06/2017 1622       A/P: 1. Medication review: Patient currently on Humira for RA and has already responded to it with no  adverse effects. Reviewed the medication with the patient, including the following: Humira is a TNF blocking agent indicated for ankylosing spondylitis, Crohn's disease, Hidradenitis suppurativa, psoriatic arthritis, plaque psoriasis, ulcerative colitis, and uveitis. The most common adverse effects are infections, headache, and injection site reactions. There is the possibility of an increased risk of malignancy but it is not well understood if this increased risk is due to there medication or the disease state. There are rare cases of pancytopenia and aplastic anemia.No recommendations for any changes.   Christella Hartigan, PharmD, BCPS, BCACP, [facility] [phone]

## 2018-01-14 MED FILL — NUVARING VAGINAL RING: 0.12-0.015 | 84 days supply | Qty: 3 | Fill #3

## 2018-01-19 ENCOUNTER — Ambulatory Visit: Payer: 59 | Admitting: Psychology

## 2018-01-20 ENCOUNTER — Ambulatory Visit: Payer: 59 | Admitting: Internal Medicine

## 2018-01-20 ENCOUNTER — Encounter: Payer: Self-pay | Admitting: Internal Medicine

## 2018-01-20 VITALS — BP 158/86 | HR 104 | Temp 98.5°F | Resp 18 | Wt 229.0 lb

## 2018-01-20 DIAGNOSIS — J0100 Acute maxillary sinusitis, unspecified: Secondary | ICD-10-CM

## 2018-01-20 DIAGNOSIS — H6690 Otitis media, unspecified, unspecified ear: Secondary | ICD-10-CM | POA: Diagnosis not present

## 2018-01-20 MED ORDER — PREDNISONE 10 MG PO TABS: ORAL_TABLET | ORAL | 0 refills | Status: DC

## 2018-01-20 MED ORDER — AMOXICILLIN-POT CLAVULANATE 875-125 MG PO TABS: 1.0000 | ORAL_TABLET | Freq: Two times a day (BID) | ORAL | 0 refills | Status: DC

## 2018-01-20 MED FILL — AMOXICILLIN-POT CLAVULANATE: 875-125 | 10 days supply | Qty: 20 | Fill #0

## 2018-01-20 MED FILL — predniSONE 10 MG TABS: 10 | 5 days supply | Qty: 10 | Fill #0

## 2018-01-20 NOTE — Patient Instructions (Addendum)
Take the antibiotic and steroid as prescribed.    Continue the over the counter cold medications.  Continue increased rest and fluids.   Your prescription(s) have been submitted to your pharmacy. Please take as directed and contact our office if you believe you are having problem(s) with the medication(s) or have any questions.  If your symptoms worsen or fail to improve, please contact our office for further instruction, or in case of emergency go directly to the emergency room at the closest medical facility.   General Recommendations:    Please drink plenty of fluids.  Get plenty of rest   Sleep in humidified air  Use saline nasal sprays  Netti pot  OTC Medications:  Decongestants - helps relieve congestion   Flonase (generic fluticasone) or Nasacort (generic triamcinolone) - please make sure to use the "cross-over" technique at a 45 degree angle towards the opposite eye as opposed to straight up the nasal passageway.   Sudafed (generic pseudoephedrine - Note this is the one that is available behind the pharmacy counter); Products with phenylephrine (-PE) may also be used but is often not as effective as pseudoephedrine.   If you have HIGH BLOOD PRESSURE - Coricidin HBP; AVOID any product that is -D as this contains pseudoephedrine which may increase your blood pressure.  Afrin (oxymetazoline) every 6-8 hours for up to 3 days.  Allergies - helps relieve runny nose, itchy eyes and sneezing   Claritin (generic loratidine), Allegra (fexofenidine), or Zyrtec (generic cyrterizine) for runny nose. These medications should not cause drowsiness.  Note - Benadryl (generic diphenhydramine) may be used however may cause drowsiness  Cough -   Delsym or Robitussin (generic dextromethorphan)  Expectorants - helps loosen mucus to ease removal   Mucinex (generic guaifenesin) as directed on the package.  Headaches / General Aches   Tylenol (generic  acetaminophen) - DO NOT EXCEED 3 grams (3,000 mg) in a 24 hour time period  Advil/Motrin (generic ibuprofen)  Sore Throat -   Salt water gargle   Chloraseptic (generic benzocaine) spray or lozenges / Sucrets (generic dyclonine)

## 2018-01-20 NOTE — Assessment & Plan Note (Signed)
Symptoms and exam consistent with a sinus infection and right otitis media Start augmentin Continue otc cold medications Rest, fluid Call if no improvement

## 2018-01-20 NOTE — Progress Notes (Signed)
Subjective:    [name], female    DOB: [birth year], 42 y.o.   MRN: [id number]  HPI She is here for an acute visit for cold symptoms.  Her symptoms started 2/22.  She had a typical head cold and a few days ago it got much worse.   She has RA and was started on Humira two weeks ago.   She is experiencing right ear pain and a feeling of being stopped up causing dizziness and nausea.  She has sinus pressure, teeth pain, headaches, nasal congestion with yellow-green mucus and this morning it was blood tinged.   She has chronic sinusitis on a Ct scan one year ago.    She has been using a neti bottle, Nasacort, dayquil.    Medications and allergies reviewed with patient and updated if appropriate.  Patient Active Problem List   Diagnosis Date Noted  . Acute sinus infection 12/25/2017  . Rheumatoid arthritis ([place]) 12/24/2017  . Trigger point of shoulder region, right 12/09/2017  . Lumbar paraspinal muscle spasm 11/13/2017  . Cervical radicular pain 09/18/2017  . Poor posture 08/14/2017  . Left otitis media 01/07/2017  . Eustachian tube dysfunction 01/07/2017  . Chronic sinusitis 01/07/2017  . N&V (nausea and vomiting) 12/31/2016  . Vertigo 12/31/2016  . 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 Nonallopathic lesion of thoracic region 06/03/2016  . Nonallopathic lesion-rib cage 06/03/2016  . Nonallopathic lesion of cervical region 06/03/2016  . Rash 05/22/2016  . Acute left-sided thoracic back pain 05/22/2016  . Hyperglycemia 05/03/2016  . Preventative health care 05/04/2015  . Left hamstring injury 01/24/2015  . Major depressive disorder, recurrent episode, moderate ([place]) 12/02/2014  . Occipital neuralgia   . ANEMIA-NOS 12/11/2009  . Hyperlipidemia 11/16/2008  . Obesity 11/16/2008  . ANXIETY DEPRESSION 11/16/2008  . Allergic rhinitis 11/16/2008  . Esophageal reflux 11/16/2008  . Constipation 11/16/2008  . Palpitations 11/16/2008    Current Outpatient  Medications on File Prior to Visit  Medication Sig Dispense Refill  . Adalimumab (HUMIRA PEN) 40 MG/0.8ML PNKT Inject 1 pen into the skin every 14 (fourteen) days. As directed 2 each 2  . ascorbic acid (VITAMIN C) 1000 MG tablet Take 1,000 mg by mouth daily.    . Ashwagandha 500 MG CAPS Take 500 mg by mouth. Take 600 mg by mouth at bedtime.    . busPIRone (BUSPAR) 15 MG tablet 1  qam   2  At  6;00 90 tablet 5  . cetirizine (ZYRTEC) 10 MG tablet Take 1 tablet (10 mg total) by mouth daily. 30 tablet 3  . cholecalciferol (VITAMIN D) 1000 units tablet Take 5,000 Units by mouth daily.    . clonazePAM (KLONOPIN) 1 MG tablet Take 1 mg by mouth daily as needed for anxiety (take half a tablet as needed).    . Coenzyme Q10 (COQ-10) 200 MG CAPS Take 2 capsules by mouth daily. 400mg  daily.    . colchicine 0.6 MG tablet Take 1 tablet (0.6 mg total) by mouth 2 (two) times daily. 60 tablet 2  . Cranberry-Milk Thistle (LIVER & KIDNEY CLEANSER PO) Take by mouth 3 (three) times daily.    . cyclobenzaprine (FLEXERIL) 10 MG tablet Take 1 tablet (10 mg total) by mouth 3 (three) times daily as needed for muscle spasms. 30 tablet 0  . dextromethorphan-guaiFENesin (MUCINEX DM) 30-600 MG 12hr tablet Take 1 tablet by mouth 2 (two) times daily.    Marland Kitchen doxylamine, Sleep, (UNISOM) 25 MG tablet Take 25  mg by mouth at bedtime as needed. Taka half a tablet as needed    . escitalopram (LEXAPRO) 20 MG tablet Take 1 tablet (20 mg total) by mouth daily. 30 tablet 4  . esomeprazole (NEXIUM) 40 MG capsule Take 1 capsule (40 mg total) by mouth daily. 90 capsule 3  . etonogestrel-ethinyl estradiol (NUVARING) 0.12-0.015 MG/24HR vaginal ring Continuous use.  Remove and insert new ring every 4 weeks. 3 each 4  . gabapentin (NEURONTIN) 100 MG capsule Take 1 capsule (100 mg total) by mouth 2 (two) times daily. 60 capsule 3  . gabapentin (NEURONTIN) 400 MG capsule Take 1 capsule (400 mg total) by mouth at bedtime. 30 capsule 3  .  Ibuprofen-Famotidine (DUEXIS) 800-26.6 MG TABS Take 800 mg by mouth 3 (three) times daily. 90 tablet 3  . leflunomide (ARAVA) 20 MG tablet Take 20 mg by mouth daily.    . meclizine (ANTIVERT) 25 MG tablet Take 25 mg by mouth. Every 4-6 hours as needed.    . montelukast (SINGULAIR) 10 MG tablet Take 1 tablet (10 mg total) by mouth at bedtime. 90 tablet 3  . Multiple Vitamin (MULTIVITAMIN) tablet Take 1 tablet by mouth daily.      . nitroGLYCERIN (NITRODUR - DOSED IN MG/24 HR) 0.2 mg/hr patch 1/4 patch daily 30 patch 1  . Omega-3 Fatty Acids (FISH OIL) 1000 MG CAPS Take 2,000 mg by mouth 2 (two) times daily.     . ondansetron (ZOFRAN) 4 MG tablet Take 4 mg by mouth every 8 (eight) hours as needed for nausea or vomiting. 4-6 hours PRN    . predniSONE (DELTASONE) 10 MG tablet 2 tabs by mouth per day for 5 days 10 tablet 1  . Probiotic Product (SOLUBLE FIBER/PROBIOTICS PO) Take 2 capsules by mouth 2 (two) times daily.    . propranolol (INDERAL) 10 MG tablet 1 bid 60 tablet 5  . traZODone (DESYREL) 50 MG tablet Take 0.5-1 tablets (25-50 mg total) by mouth at bedtime as needed for sleep. 30 tablet 3  . vitamin E 400 UNIT capsule Take 400 Units by mouth daily.    Marland Kitchen zolpidem (AMBIEN) 5 MG tablet Take 1 tablet (5 mg total) by mouth at bedtime as needed for sleep. 30 tablet 2   No current facility-administered medications on file prior to visit.     Past Medical History:  Diagnosis Date  . ALLERGIC RHINITIS   . ANXIETY DEPRESSION   . CONSTIPATION   . DEPRESSION   . Esophageal reflux   . HYPERCHOLESTEROLEMIA   . OBESITY   . Occipital neuralgia   . Palpitations   . Rheumatoid arthritis ([place]) 12/24/2017    Past Surgical History:  Procedure Laterality Date  . NO PAST SURGERIES      Social History   Socioeconomic History  . Marital status: Married    Spouse name: None  . Number of children: None  . Years of education: None  . Highest education level: None  Social Needs  . Financial  resource strain: None  . Food insecurity - worry: None  . Food insecurity - inability: None  . Transportation needs - medical: None  . Transportation needs - non-medical: None  Occupational History  . None  Tobacco Use  . Smoking status: Never Smoker  . Smokeless tobacco: Never Used  . Tobacco comment: Married, lives with spouse  Substance and Sexual Activity  . Alcohol use: No  . Drug use: No  . Sexual activity: Yes    Partners:  Male    Birth control/protection: Inserts    Comment: 1ST INTERCOURSE- 21, PARTNERS- 3   Other Topics Concern  . None  Social History Narrative  . None    Family History  Problem Relation Age of Onset  . Hypertension Mother   . Diabetes Mother   . Hyperlipidemia Mother   . Breast cancer Mother 15  . Osteoarthritis Mother   . Hyperlipidemia Father   . Coronary artery disease Father        MI age 56  . Hyperlipidemia Brother     Review of Systems  Constitutional: Negative for chills and fever.  HENT: Positive for congestion, ear pain (right), hearing loss (right), sinus pressure, sinus pain and sore throat (mild from drainage).        Teeth pain  Respiratory: Positive for cough. Negative for chest tightness, shortness of breath and wheezing.   Gastrointestinal: Positive for diarrhea and nausea (from ear).  Musculoskeletal: Negative for myalgias.  Neurological: Positive for dizziness and headaches.       Objective:   Vitals:   01/20/18 1034  BP: (!) 158/86  Pulse: (!) 104  Resp: 18  Temp: 98.5 F (36.9 C)  SpO2: 98%   Filed Weights   01/20/18 1034  Weight: 229 lb (103.9 kg)   Body mass index is 32.86 kg/m.  Wt Readings from Last 3 Encounters:  01/20/18 229 lb (103.9 kg)  12/26/17 227 lb (103 kg)  12/24/17 227 lb (103 kg)     Physical Exam GENERAL APPEARANCE: moderately ill appearing, NAD EYES: conjunctiva clear, no icterus HEENT: bilateral ear canals normal, right tympanic membrane erythematous and non bulging, left TM  normal, oropharynx with mild erythema, no thyromegaly, trachea midline, no cervical or supraclavicular lymphadenopathy LUNGS: Clear to auscultation without wheeze or crackles, unlabored breathing, good air entry bilaterally CARDIOVASCULAR: Normal S1,S2 without murmurs, no edema SKIN: warm, dry        Assessment & Plan:   See Problem List for Assessment and Plan of chronic medical problems.

## 2018-01-23 ENCOUNTER — Ambulatory Visit: Payer: Self-pay | Admitting: Family Medicine

## 2018-01-25 ENCOUNTER — Encounter: Payer: Self-pay | Admitting: Internal Medicine

## 2018-01-25 NOTE — Progress Notes (Signed)
[name] [place], [place] [address] Phone: [phone] Subjective:     CC: Back pain follow-up  [id number]  April [name] is a 42 y.o. female coming in with complaint of *axial muscle complaints.  Has had trouble with upper back, neck and lower back.  Has responded fairly well to osteopathic manipulation.  Been doing a lot of other modalities including massage.  Patient is being also treated by rheumatology.  Attempted to call rheumatology multiple times and unfortunately have not been able to discuss with them.  Patient states that she is started on Humira and is doing much better.  Patient states that she has had decrease in pain since last visit. She feels that the nitro patch for her right scapula did help as well..   Past Medical History:  Diagnosis Date  . ALLERGIC RHINITIS   . ANXIETY DEPRESSION   . CONSTIPATION   . DEPRESSION   . Esophageal reflux   . HYPERCHOLESTEROLEMIA   . OBESITY   . Occipital neuralgia   . Palpitations   . Rheumatoid arthritis ([place]) 12/24/2017   Past Surgical History:  Procedure Laterality Date  . NO PAST SURGERIES     Social History   Socioeconomic History  . Marital status: Married    Spouse name: None  . Number of children: None  . Years of education: None  . Highest education level: None  Social Needs  . Financial resource strain: None  . Food insecurity - worry: None  . Food insecurity - inability: None  . Transportation needs - medical: None  . Transportation needs - non-medical: None  Occupational History  . None  Tobacco Use  . Smoking status: Never Smoker  . Smokeless tobacco: Never Used  . Tobacco comment: Married, lives with spouse  Substance and Sexual Activity  . Alcohol use: No  . Drug use: No  . Sexual activity: Yes    Partners: Male    Birth control/protection: Inserts    Comment: 1ST INTERCOURSE- 21, PARTNERS- 3   Other Topics Concern  . None  Social History  Narrative  . None   Allergies  Allergen Reactions  . Ciprofloxacin     toxicity  . Levaquin [Levofloxacin] Anxiety    Possible CNS side effects. TOXICITY  . Moxifloxacin     TOXICITY   Family History  Problem Relation Age of Onset  . Hypertension Mother   . Diabetes Mother   . Hyperlipidemia Mother   . Breast cancer Mother 5  . Osteoarthritis Mother   . Hyperlipidemia Father   . Coronary artery disease Father        MI age 75  . Hyperlipidemia Brother      Past medical history, social, surgical and family history all reviewed in electronic medical record.  No pertanent information unless stated regarding to the chief complaint.   Review of Systems:Review of systems updated and as accurate as of 01/26/18  No headache, visual changes, nausea, vomiting, diarrhea, constipation, dizziness, abdominal pain, skin rash, fevers, chills, night sweats, weight loss, swollen lymph nodes, body aches, joint swelling, , chest pain, shortness of breath, mood changes.  Decrease in muscle aches  Objective  Blood pressure 138/84, pulse 87, height 5\' 10"  (1.778 m), weight 229 lb (103.9 kg), SpO2 98 %. Systems examined below as of 01/26/18   General: No apparent distress alert and oriented x3 mood and affect normal, dressed appropriately.  HEENT: Pupils equal, extraocular movements intact  Respiratory:  Patient's speak in full sentences and does not appear short of breath  Cardiovascular: No lower extremity edema, non tender, no erythema  Skin: Warm dry intact with no signs of infection or rash on extremities or on axial skeleton.  Abdomen: Soft nontender  Neuro: Cranial nerves II through XII are intact, neurovascularly intact in all extremities with 2+ DTRs and 2+ pulses.  Lymph: No lymphadenopathy of posterior or anterior cervical chain or axillae bilaterally.  Gait normal with good balance and coordination.  MSK:  Non tender with full range of motion and good stability and symmetric strength  and tone of shoulders, elbows, wrist, hip, knee and ankles bilaterally.  Back Exam:  Inspection: Unremarkable  Motion: Flexion 45 deg, Extension 35 deg, Side Bending to 35 deg bilaterally,  Rotation to 35 deg bilaterally  SLR laying: Negative  XSLR laying: Negative  Palpable tenderness: Mild tenderness in the thoracolumbar junction bilaterally.  Significant less pain than previous exam around the scapula where the muscle was injured.Marland Kitchen FABER: negative. Sensory change: Gross sensation intact to all lumbar and sacral dermatomes.  Reflexes: 2+ at both patellar tendons, 2+ at achilles tendons, Babinski's downgoing.  Strength at foot  Plantar-flexion: 5/5 Dorsi-flexion: 5/5 Eversion: 5/5 Inversion: 5/5  Leg strength  Quad: 5/5 Hamstring: 5/5 Hip flexor: 5/5 Hip abductors: 5/5  Gait unremarkable.  Osteopathic findings C2 flexed rotated and side bent right C4 flexed rotated and side bent left C6 flexed rotated and side bent left T3 extended rotated and side bent right inhaled third rib T9 extended rotated and side bent left    Impression and Recommendations:     This case required medical decision making of moderate complexity.      Note: This dictation was prepared with Dragon dictation along with smaller phrase technology. Any transcriptional errors that result from this process are unintentional.

## 2018-01-26 ENCOUNTER — Ambulatory Visit (INDEPENDENT_AMBULATORY_CARE_PROVIDER_SITE_OTHER): Payer: 59 | Admitting: Family Medicine

## 2018-01-26 ENCOUNTER — Encounter: Payer: Self-pay | Admitting: Family Medicine

## 2018-01-26 VITALS — BP 138/84 | HR 87 | Ht 70.0 in | Wt 229.0 lb

## 2018-01-26 DIAGNOSIS — M940 Chondrocostal junction syndrome [Tietze]: Secondary | ICD-10-CM

## 2018-01-26 DIAGNOSIS — M999 Biomechanical lesion, unspecified: Secondary | ICD-10-CM | POA: Diagnosis not present

## 2018-01-26 MED ORDER — MECLIZINE HCL 25 MG PO TABS: 25.0000 mg | ORAL_TABLET | Freq: Three times a day (TID) | ORAL | 0 refills | Status: DC | PRN

## 2018-01-26 MED FILL — MECLIZINE 25 MG TABLET: 25 | 10 days supply | Qty: 30 | Fill #0

## 2018-01-26 MED FILL — NITROGLYCERIN 0.2 MG/HR PTC: 0.2 | 28 days supply | Qty: 7 | Fill #1

## 2018-01-26 MED FILL — MONTELUKAST SOD 10 MG TAB: 10 | 90 days supply | Qty: 90 | Fill #2

## 2018-01-26 NOTE — Patient Instructions (Signed)
Good to see you  Alvera Singh is your friend.  I am so impressed See me again in 6 weeks!

## 2018-01-26 NOTE — Assessment & Plan Note (Signed)
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  I believe the patient will do well.  Follow-up in 6 weeks

## 2018-01-26 NOTE — Assessment & Plan Note (Signed)
Decision today to treat with OMT was based on Physical Exam  After verbal consent patient was treated with HVLA, ME, FPR techniques in cervical, thoracic, rib areas  Patient tolerated the procedure well with improvement in symptoms  Patient given exercises, stretches and lifestyle modifications  See medications in patient instructions if given  Patient will follow up in 6 weeks

## 2018-01-28 ENCOUNTER — Encounter: Payer: Self-pay | Admitting: Infectious Diseases

## 2018-01-28 DIAGNOSIS — L0390 Cellulitis, unspecified: Secondary | ICD-10-CM | POA: Diagnosis not present

## 2018-01-28 DIAGNOSIS — L723 Sebaceous cyst: Secondary | ICD-10-CM | POA: Diagnosis not present

## 2018-01-28 DIAGNOSIS — L089 Local infection of the skin and subcutaneous tissue, unspecified: Secondary | ICD-10-CM | POA: Diagnosis not present

## 2018-01-28 DIAGNOSIS — D229 Melanocytic nevi, unspecified: Secondary | ICD-10-CM | POA: Diagnosis not present

## 2018-01-28 MED FILL — MUPIROCIN 2% OINTMENT: 2 | 7 days supply | Qty: 22 | Fill #0

## 2018-01-28 MED FILL — DOXYCYCLINE HYCLATE 100 MG: 100 | 14 days supply | Qty: 28 | Fill #0

## 2018-02-04 ENCOUNTER — Ambulatory Visit: Payer: Self-pay | Admitting: Internal Medicine

## 2018-02-04 ENCOUNTER — Encounter: Payer: Self-pay | Admitting: Family

## 2018-02-04 ENCOUNTER — Ambulatory Visit: Payer: 59 | Admitting: Family

## 2018-02-04 VITALS — BP 136/72 | HR 80 | Temp 98.9°F | Ht 70.0 in | Wt 207.0 lb

## 2018-02-04 DIAGNOSIS — M0589 Other rheumatoid arthritis with rheumatoid factor of multiple sites: Secondary | ICD-10-CM | POA: Diagnosis not present

## 2018-02-04 DIAGNOSIS — H6980 Other specified disorders of Eustachian tube, unspecified ear: Secondary | ICD-10-CM

## 2018-02-04 MED ORDER — PREDNISONE 20 MG PO TABS: 40.0000 mg | ORAL_TABLET | Freq: Every day | ORAL | 0 refills | Status: DC

## 2018-02-04 MED FILL — GABAPENTIN 100 MG CAP: 100 | 30 days supply | Qty: 60 | Fill #2

## 2018-02-04 MED FILL — GABAPENTIN 400 MG CAPSULE: 400 | 30 days supply | Qty: 30 | Fill #2

## 2018-02-04 MED FILL — predniSONE 20 MG TABS: 20 | 5 days supply | Qty: 10 | Fill #0

## 2018-02-04 MED FILL — ESOMEPRAZOLE MAG DR 40 MG C: 40 | 90 days supply | Qty: 90 | Fill #1

## 2018-02-04 NOTE — Progress Notes (Signed)
April [name] is a 42 y.o. female with the following history as recorded in [employer]:  Patient Active Problem List   Diagnosis Date Noted  . Acute non-recurrent maxillary sinusitis 01/20/2018  . Acute otitis media 01/20/2018  . Acute sinus infection 12/25/2017  . Rheumatoid arthritis ([place]) 12/24/2017  . Trigger point of shoulder region, right 12/09/2017  . Lumbar paraspinal muscle spasm 11/13/2017  . Cervical radicular pain 09/18/2017  . Poor posture 08/14/2017  . Left otitis media 01/07/2017  . Eustachian tube dysfunction 01/07/2017  . Chronic sinusitis 01/07/2017  . N&V (nausea and vomiting) 12/31/2016  . Vertigo 12/31/2016  . 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 Nonallopathic lesion of thoracic region 06/03/2016  . Nonallopathic lesion-rib cage 06/03/2016  . Nonallopathic lesion of cervical region 06/03/2016  . Rash 05/22/2016  . Acute left-sided thoracic back pain 05/22/2016  . Hyperglycemia 05/03/2016  . Preventative health care 05/04/2015  . Left hamstring injury 01/24/2015  . Major depressive disorder, recurrent episode, moderate ([place]) 12/02/2014  . Occipital neuralgia   . ANEMIA-NOS 12/11/2009  . Hyperlipidemia 11/16/2008  . Obesity 11/16/2008  . ANXIETY DEPRESSION 11/16/2008  . Allergic rhinitis 11/16/2008  . Esophageal reflux 11/16/2008  . Constipation 11/16/2008  . Palpitations 11/16/2008    Current Outpatient Medications  Medication Sig Dispense Refill  . Adalimumab (HUMIRA PEN) 40 MG/0.8ML PNKT Inject 1 pen into the skin every 14 (fourteen) days. As directed 2 each 2  . ascorbic acid (VITAMIN C) 1000 MG tablet Take 1,000 mg by mouth daily.    . busPIRone (BUSPAR) 15 MG tablet 1  qam   2  At  6;00 90 tablet 5  . cetirizine (ZYRTEC) 10 MG tablet Take 1 tablet (10 mg total) by mouth daily. 30 tablet 3  . cholecalciferol (VITAMIN D) 1000 units tablet Take 5,000 Units by mouth daily.    . clonazePAM (KLONOPIN) 1 MG tablet Take 1 mg by mouth daily as needed for  anxiety (take half a tablet as needed).    . Coenzyme Q10 (COQ-10) 200 MG CAPS Take 2 capsules by mouth daily. 400mg  daily.    . cyclobenzaprine (FLEXERIL) 10 MG tablet Take 1 tablet (10 mg total) by mouth 3 (three) times daily as needed for muscle spasms. 30 tablet 0  . dextromethorphan-guaiFENesin (MUCINEX DM) 30-600 MG 12hr tablet Take 1 tablet by mouth 2 (two) times daily.    Marland Kitchen escitalopram (LEXAPRO) 20 MG tablet Take 1 tablet (20 mg total) by mouth daily. 30 tablet 4  . esomeprazole (NEXIUM) 40 MG capsule Take 1 capsule (40 mg total) by mouth daily. 90 capsule 3  . etonogestrel-ethinyl estradiol (NUVARING) 0.12-0.015 MG/24HR vaginal ring Continuous use.  Remove and insert new ring every 4 weeks. 3 each 4  . gabapentin (NEURONTIN) 100 MG capsule Take 1 capsule (100 mg total) by mouth 2 (two) times daily. 60 capsule 3  . gabapentin (NEURONTIN) 400 MG capsule Take 1 capsule (400 mg total) by mouth at bedtime. 30 capsule 3  . Ibuprofen-Famotidine (DUEXIS) 800-26.6 MG TABS Take 800 mg by mouth 3 (three) times daily. 90 tablet 3  . leflunomide (ARAVA) 20 MG tablet Take 20 mg by mouth daily.    . meclizine (ANTIVERT) 25 MG tablet Take 1 tablet (25 mg total) by mouth 3 (three) times daily as needed for dizziness. Every 4-6 hours as needed. 30 tablet 0  . montelukast (SINGULAIR) 10 MG tablet Take 1 tablet (10 mg total) by mouth at bedtime. 90 tablet 3  . Multiple  Vitamin (MULTIVITAMIN) tablet Take 1 tablet by mouth daily.      . nitroGLYCERIN (NITRODUR - DOSED IN MG/24 HR) 0.2 mg/hr patch 1/4 patch daily 30 patch 1  . Omega-3 Fatty Acids (FISH OIL) 1000 MG CAPS Take 2,000 mg by mouth 2 (two) times daily.     . ondansetron (ZOFRAN) 4 MG tablet Take 4 mg by mouth every 8 (eight) hours as needed for nausea or vomiting. 4-6 hours PRN    . Probiotic Product (SOLUBLE FIBER/PROBIOTICS PO) Take 2 capsules by mouth 2 (two) times daily.    . traZODone (DESYREL) 50 MG tablet Take 0.5-1 tablets (25-50 mg total) by  mouth at bedtime as needed for sleep. 30 tablet 3  . vitamin E 400 UNIT capsule Take 400 Units by mouth daily.    Marland Kitchen zolpidem (AMBIEN) 5 MG tablet Take 1 tablet (5 mg total) by mouth at bedtime as needed for sleep. 30 tablet 2  . amoxicillin-clavulanate (AUGMENTIN) 875-125 MG tablet Take 1 tablet by mouth 2 (two) times daily. (Patient not taking: Reported on 02/04/2018) 20 tablet 0  . Ashwagandha 500 MG CAPS Take 500 mg by mouth. Take 600 mg by mouth at bedtime.    . colchicine 0.6 MG tablet Take 1 tablet (0.6 mg total) by mouth 2 (two) times daily. (Patient not taking: Reported on 02/04/2018) 60 tablet 2  . Cranberry-Milk Thistle (LIVER & KIDNEY CLEANSER PO) Take by mouth 3 (three) times daily.    Marland Kitchen doxylamine, Sleep, (UNISOM) 25 MG tablet Take 25 mg by mouth at bedtime as needed. Taka half a tablet as needed    . predniSONE (DELTASONE) 20 MG tablet Take 2 tablets (40 mg total) by mouth daily with breakfast. 10 tablet 0  . propranolol (INDERAL) 10 MG tablet 1 bid (Patient not taking: Reported on 02/04/2018) 60 tablet 5   No current facility-administered medications for this visit.     Allergies: Ciprofloxacin; Levaquin [levofloxacin]; and Moxifloxacin  Past Medical History:  Diagnosis Date  . ALLERGIC RHINITIS   . ANXIETY DEPRESSION   . CONSTIPATION   . DEPRESSION   . Esophageal reflux   . HYPERCHOLESTEROLEMIA   . OBESITY   . Occipital neuralgia   . Palpitations   . Rheumatoid arthritis ([place]) 12/24/2017    Past Surgical History:  Procedure Laterality Date  . NO PAST SURGERIES      Family History  Problem Relation Age of Onset  . Hypertension Mother   . Diabetes Mother   . Hyperlipidemia Mother   . Breast cancer Mother 69  . Osteoarthritis Mother   . Hyperlipidemia Father   . Coronary artery disease Father        MI age 75  . Hyperlipidemia Brother     Social History   Tobacco Use  . Smoking status: Never Smoker  . Smokeless tobacco: Never Used  . Tobacco comment: Married,  lives with spouse  Substance Use Topics  . Alcohol use: No    Subjective:  Seen on 01/20/2018 with concerns for sinus infection and right ear fullness; was treated with Augmentin and 5 days of prednisone at 20 mg qd; notes she is feeling better but sensation is persisting; is now on Doxycycline from her dermatologist for abscess- has been on for 7 days and supposed to take for another 7; does already use Nasacort AQ;   Objective:  Vitals:   02/04/18 1119  BP: 136/72  Pulse: 80  Temp: 98.9 F (37.2 C)  TempSrc: Oral  SpO2: 98%  Weight: 207 lb 0.6 oz (93.9 kg)  Height: 5\' 10"  (1.778 m)    General: Well developed, well nourished, in no acute distress  Skin : Warm and dry.  Head: Normocephalic and atraumatic  Eyes: Sclera and conjunctiva clear; pupils round and reactive to light; extraocular movements intact  Ears: External normal; canals clear; tympanic membranes congested, full Oropharynx: Pink, supple. No suspicious lesions  Neck: Supple without thyromegaly, adenopathy  Lungs: Respirations unlabored; clear to auscultation bilaterally without wheeze, rales, rhonchi  CVS exam: normal rate and regular rhythm.  Abdomen: Soft; nontender; nondistended; normoactive bowel sounds; no masses or hepatosplenomegaly  Musculoskeletal: No deformities; no active joint inflammation  Extremities: No edema, cyanosis, clubbing  Vessels: Symmetric bilaterally  Neurologic: Alert and oriented; speech intact; face symmetrical; moves all extremities well; CNII-XII intact without focal deficit  Assessment:  1. Dysfunction of Eustachian tube, unspecified laterality     Plan:  Continue Doxycycline as prescribed by dermatologist for other issue- should be beneficial for sinuses; increase Nasacort AQ to bid; trial of Prednisone 40 mg qd x 5 days; refer to ENT- patient may need tubes placed.    No Follow-up on file.  Orders Placed This Encounter  Procedures  . Ambulatory referral to ENT    Referral  Priority:   Routine    Referral Type:   Consultation    Referral Reason:   Specialty Services Required    Requested Specialty:   Otolaryngology    Number of Visits Requested:   1    Requested Prescriptions   Signed Prescriptions Disp Refills  . predniSONE (DELTASONE) 20 MG tablet 10 tablet 0    Sig: Take 2 tablets (40 mg total) by mouth daily with breakfast.

## 2018-02-12 ENCOUNTER — Other Ambulatory Visit: Payer: Self-pay | Admitting: Pharmacist

## 2018-02-12 MED ORDER — ADALIMUMAB 40 MG/0.4ML ~~LOC~~ AJKT: 1.0000 "pen " | AUTO-INJECTOR | SUBCUTANEOUS | 2 refills | Status: DC

## 2018-02-13 MED FILL — LEFLUNOMIDE 20 MG TABLET: 20 | 30 days supply | Qty: 30 | Fill #3

## 2018-02-19 DIAGNOSIS — M0589 Other rheumatoid arthritis with rheumatoid factor of multiple sites: Secondary | ICD-10-CM | POA: Diagnosis not present

## 2018-02-19 DIAGNOSIS — M2550 Pain in unspecified joint: Secondary | ICD-10-CM | POA: Diagnosis not present

## 2018-02-19 DIAGNOSIS — R768 Other specified abnormal immunological findings in serum: Secondary | ICD-10-CM | POA: Diagnosis not present

## 2018-02-19 DIAGNOSIS — Z8709 Personal history of other diseases of the respiratory system: Secondary | ICD-10-CM | POA: Diagnosis not present

## 2018-02-19 DIAGNOSIS — E669 Obesity, unspecified: Secondary | ICD-10-CM | POA: Diagnosis not present

## 2018-02-19 DIAGNOSIS — Z6835 Body mass index (BMI) 35.0-35.9, adult: Secondary | ICD-10-CM | POA: Diagnosis not present

## 2018-02-20 DIAGNOSIS — J00 Acute nasopharyngitis [common cold]: Secondary | ICD-10-CM | POA: Diagnosis not present

## 2018-02-20 DIAGNOSIS — H6501 Acute serous otitis media, right ear: Secondary | ICD-10-CM | POA: Insufficient documentation

## 2018-02-20 DIAGNOSIS — J3081 Allergic rhinitis due to animal (cat) (dog) hair and dander: Secondary | ICD-10-CM | POA: Diagnosis not present

## 2018-02-23 MED FILL — NITROGLYCERIN 0.2 MG/HR PTC: 0.2 | 28 days supply | Qty: 7 | Fill #2

## 2018-02-24 ENCOUNTER — Other Ambulatory Visit: Payer: Self-pay | Admitting: Otolaryngology

## 2018-02-24 DIAGNOSIS — J00 Acute nasopharyngitis [common cold]: Secondary | ICD-10-CM

## 2018-02-24 DIAGNOSIS — J3081 Allergic rhinitis due to animal (cat) (dog) hair and dander: Secondary | ICD-10-CM

## 2018-02-24 DIAGNOSIS — H6501 Acute serous otitis media, right ear: Secondary | ICD-10-CM

## 2018-02-25 ENCOUNTER — Ambulatory Visit
Admission: RE | Admit: 2018-02-25 | Discharge: 2018-02-25 | Disposition: A | Discharge status: Home / Self Care | Payer: 59 | Source: Ambulatory Visit | Attending: Otolaryngology | Admitting: Otolaryngology

## 2018-02-25 DIAGNOSIS — J3081 Allergic rhinitis due to animal (cat) (dog) hair and dander: Secondary | ICD-10-CM

## 2018-02-25 DIAGNOSIS — H6501 Acute serous otitis media, right ear: Secondary | ICD-10-CM

## 2018-02-25 DIAGNOSIS — J329 Chronic sinusitis, unspecified: Secondary | ICD-10-CM | POA: Diagnosis not present

## 2018-02-25 DIAGNOSIS — J00 Acute nasopharyngitis [common cold]: Secondary | ICD-10-CM

## 2018-02-26 MED FILL — HUMIRA PEN 40 MG/0.4ML PNKT: 40 | 28 days supply | Qty: 2 | Fill #0

## 2018-03-10 NOTE — Progress Notes (Signed)
[name] [place], [place] [address] Phone: [phone] Subjective:    CC: Back pain follow-up  [id number]  April [name] is a 42 y.o. female coming in with complaint of back pain. She is having pain in back right scapula. She does have a rash from the nitro patch. Patient has not been having any rib pain since last visit.  Patient feels like she is making significant progress.  Feels like the treatment of the rheumatoid arthritis has been something that has been most beneficial.  Been able to work out on a more regular basis.  Feels that manipulation has been helpful.     Past Medical History:  Diagnosis Date  . ALLERGIC RHINITIS   . ANXIETY DEPRESSION   . CONSTIPATION   . DEPRESSION   . Esophageal reflux   . HYPERCHOLESTEROLEMIA   . OBESITY   . Occipital neuralgia   . Palpitations   . Rheumatoid arthritis ([place]) 12/24/2017   Past Surgical History:  Procedure Laterality Date  . NO PAST SURGERIES     Social History   Socioeconomic History  . Marital status: Married    Spouse name: Not on file  . Number of children: Not on file  . Years of education: Not on file  . Highest education level: Not on file  Occupational History  . Not on file  Social Needs  . Financial resource strain: Not on file  . Food insecurity:    Worry: Not on file    Inability: Not on file  . Transportation needs:    Medical: Not on file    Non-medical: Not on file  Tobacco Use  . Smoking status: Never Smoker  . Smokeless tobacco: Never Used  . Tobacco comment: Married, lives with spouse  Substance and Sexual Activity  . Alcohol use: No  . Drug use: No  . Sexual activity: Yes    Partners: Male    Birth control/protection: Inserts    Comment: 1ST INTERCOURSE- 21, PARTNERS- 3   Lifestyle  . Physical activity:    Days per week: Not on file    Minutes per session: Not on file  . Stress: Not on file  Relationships  . Social connections:      Talks on phone: Not on file    Gets together: Not on file    Attends religious service: Not on file    Active member of club or organization: Not on file    Attends meetings of clubs or organizations: Not on file    Relationship status: Not on file  Other Topics Concern  . Not on file  Social History Narrative  . Not on file   Allergies  Allergen Reactions  . Ciprofloxacin     toxicity  . Levaquin [Levofloxacin] Anxiety    Possible CNS side effects. TOXICITY  . Moxifloxacin     TOXICITY   Family History  Problem Relation Age of Onset  . Hypertension Mother   . Diabetes Mother   . Hyperlipidemia Mother   . Breast cancer Mother 71  . Osteoarthritis Mother   . Hyperlipidemia Father   . Coronary artery disease Father        MI age 48  . Hyperlipidemia Brother      Past medical history, social, surgical and family history all reviewed in electronic medical record.  No pertanent information unless stated regarding to the chief complaint.   Review of Systems:Review of systems updated and as  accurate as of 03/11/18  No headache, visual changes, nausea, vomiting, diarrhea, constipation, dizziness, abdominal pain, skin rash, fevers, chills, night sweats, weight loss, swollen lymph nodes, body aches, joint swelling, muscle aches, chest pain, shortness of breath, mood changes.   Objective  Blood pressure 110/80, pulse 78, height 5\' 10"  (1.778 m), weight 237 lb (107.5 kg), SpO2 97 %. Systems examined below as of 03/11/18   General: No apparent distress alert and oriented x3 mood and affect normal, dressed appropriately.  HEENT: Pupils equal, extraocular movements intact  Respiratory: Patient's speak in full sentences and does not appear short of breath  Cardiovascular: No lower extremity edema, non tender, no erythema  Skin: Warm dry intact with no signs of infection or rash on extremities or on axial skeleton.  Abdomen: Soft nontender  Neuro: Cranial nerves II through XII are  intact, neurovascularly intact in all extremities with 2+ DTRs and 2+ pulses.  Lymph: No lymphadenopathy of posterior or anterior cervical chain or axillae bilaterally.  Gait normal with good balance and coordination.  MSK:  Non tender with full range of motion and good stability and symmetric strength and tone of shoulders, elbows, wrist, hip, knee and ankles bilaterally.  Back Exam:  Inspection: Unremarkable  Motion: Flexion 45 deg, Extension 25 deg, Side Bending to 35 deg bilaterally,  Rotation to 45 deg bilaterally  SLR laying: Negative  XSLR laying: Negative  Palpable tenderness: Still tightness in the thoracolumbar juncture on the right side. FABER: negative. Sensory change: Gross sensation intact to all lumbar and sacral dermatomes.  Reflexes: 2+ at both patellar tendons, 2+ at achilles tendons, Babinski's downgoing.  Strength at foot  Plantar-flexion: 5/5 Dorsi-flexion: 5/5 Eversion: 5/5 Inversion: 5/5  Leg strength  Quad: 5/5 Hamstring: 5/5 Hip flexor: 5/5 Hip abductors: 5/5  Gait unremarkable.  Osteopathic findings C4 flexed rotated and side bent left C6 flexed rotated and side bent left T3 extended rotated and side bent right inhaled third rib T5 extended rotated and side bent right  L2 flexed rotated and side bent right Sacrum right on right     Impression and Recommendations:     This case required medical decision making of moderate complexity.      Note: This dictation was prepared with Dragon dictation along with smaller phrase technology. Any transcriptional errors that result from this process are unintentional.

## 2018-03-11 ENCOUNTER — Encounter: Payer: Self-pay | Admitting: Family Medicine

## 2018-03-11 ENCOUNTER — Ambulatory Visit (INDEPENDENT_AMBULATORY_CARE_PROVIDER_SITE_OTHER): Payer: 59 | Admitting: Family Medicine

## 2018-03-11 VITALS — BP 110/80 | HR 78 | Ht 70.0 in | Wt 237.0 lb

## 2018-03-11 DIAGNOSIS — R293 Abnormal posture: Secondary | ICD-10-CM | POA: Diagnosis not present

## 2018-03-11 DIAGNOSIS — M999 Biomechanical lesion, unspecified: Secondary | ICD-10-CM

## 2018-03-11 NOTE — Assessment & Plan Note (Signed)
Still likely secondary to more of the posture in the slippery.  Discussed icing regimen and home exercises.  Discussed objective sitting which was to avoid.  Patient is to increase activity slowly over the course the next several days.  Will follow-up with me again in 8 weeks

## 2018-03-11 NOTE — Patient Instructions (Signed)
Good to see you  April [name] is your friend.  Stay active I am impressed Hydrocortisone and lotion  See me again in 2 months

## 2018-03-11 NOTE — Assessment & Plan Note (Signed)
Decision today to treat with OMT was based on Physical Exam  After verbal consent patient was treated with HVLA, ME, FPR techniques in cervical, thoracic, rib areas  Patient tolerated the procedure well with improvement in symptoms  Patient given exercises, stretches and lifestyle modifications  See medications in patient instructions if given  Patient will follow up in 8 weeks 

## 2018-03-13 ENCOUNTER — Encounter (HOSPITAL_COMMUNITY): Payer: Self-pay | Admitting: Psychiatry

## 2018-03-13 ENCOUNTER — Ambulatory Visit (HOSPITAL_COMMUNITY): Payer: 59 | Admitting: Psychiatry

## 2018-03-13 VITALS — BP 124/81 | HR 86 | Ht 70.0 in | Wt 240.2 lb

## 2018-03-13 DIAGNOSIS — F3342 Major depressive disorder, recurrent, in full remission: Secondary | ICD-10-CM

## 2018-03-13 DIAGNOSIS — M069 Rheumatoid arthritis, unspecified: Secondary | ICD-10-CM | POA: Diagnosis not present

## 2018-03-13 MED ORDER — BUSPIRONE HCL 15 MG PO TABS: ORAL_TABLET | ORAL | 5 refills | Status: DC

## 2018-03-13 MED ORDER — ESCITALOPRAM OXALATE 20 MG PO TABS: 20.0000 mg | ORAL_TABLET | Freq: Every day | ORAL | 6 refills | Status: DC

## 2018-03-13 MED FILL — busPIRone HCL 15 MG TABS: 15 | 30 days supply | Qty: 60 | Fill #0

## 2018-03-13 MED FILL — ESCITALOPRAM 20 MG TABLET: 20 | 30 days supply | Qty: 30 | Fill #0

## 2018-03-13 NOTE — Progress Notes (Signed)
[facility] MD Progress Note  03/13/2018 8:58 AM April [name]  MRN:  [id number] Subjective: Doing great Principal Problem: Major depression, recurrent, remission Diagnosis:  Major depression, recurrent, remission  Today the patient is doing very well. All her antibiotic toxicity that she had years ago seems to be gone. Her anxiety is much improved. She's working well. She has rheumatoid arthritis and takes some disease modifying agents which seem to be working well. She denies daily depression or problems with sleep or appetite. She's got good energy. She works 40 hours a week. She takes BuSpar 15 mg twice a day. We had initially recently increased it but she chose to go back to the original dose of twice a day. She continues taking Lexapro as prescribed. She denies any significant pain at this time. She has no neurological complaints she denies chest pain shortness of breath. She is a good relationship with her husband.She is happy with life. She does not use any alcohol and uses no illicit drugs. She's past and optimistic. She's adjusting to the idea that she has rheumatoid arthritis Patient Active Problem List   Diagnosis Date Noted  . Acute non-recurrent maxillary sinusitis [J01.00] 01/20/2018  . Acute otitis media [H66.90] 01/20/2018  . Acute sinus infection [J01.90] 12/25/2017  . Rheumatoid arthritis ([place]) [M06.9] 12/24/2017  . Trigger point of shoulder region, right [M25.511] 12/09/2017  . Lumbar paraspinal muscle spasm [M62.830] 11/13/2017  . Cervical radicular pain [M54.12] 09/18/2017  . Poor posture [R29.3] 08/14/2017  . Left otitis media [H66.92] 01/07/2017  . Eustachian tube dysfunction [H69.80] 01/07/2017  . Chronic sinusitis [J32.9] 01/07/2017  . N&V (nausea and vomiting) [R11.2] 12/31/2016  . Vertigo [R42] 12/31/2016  . 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 Nonallopathic lesion of thoracic region [M99.9] 06/03/2016  . Nonallopathic lesion-rib cage [M99.9] 06/03/2016  .  Nonallopathic lesion of cervical region [M99.9] 06/03/2016  . Rash [R21] 05/22/2016  . Acute left-sided thoracic back pain [M54.6] 05/22/2016  . Hyperglycemia [R73.9] 05/03/2016  . Preventative health care [Z00.00] 05/04/2015  . Left hamstring injury [S76.302A] 01/24/2015  . Major depressive disorder, recurrent episode, moderate ([place]) [F33.1] 12/02/2014  . Occipital neuralgia [M54.81]   . ANEMIA-NOS [D64.9] 12/11/2009  . Hyperlipidemia [E78.5] 11/16/2008  . Obesity [E66.9] 11/16/2008  . ANXIETY DEPRESSION [F34.1] 11/16/2008  . Allergic rhinitis [J30.9] 11/16/2008  . Esophageal reflux [K21.9] 11/16/2008  . Constipation [K59.00] 11/16/2008  . Palpitations [R00.2] 11/16/2008   Total Time spent with patient: 30 minutes  Past Psychiatric History:   Past Medical History:  Past Medical History:  Diagnosis Date  . ALLERGIC RHINITIS   . ANXIETY DEPRESSION   . CONSTIPATION   . DEPRESSION   . Esophageal reflux   . HYPERCHOLESTEROLEMIA   . OBESITY   . Occipital neuralgia   . Palpitations   . Rheumatoid arthritis ([place]) 12/24/2017    Past Surgical History:  Procedure Laterality Date  . NO PAST SURGERIES     Family History:  Family History  Problem Relation Age of Onset  . Hypertension Mother   . Diabetes Mother   . Hyperlipidemia Mother   . Breast cancer Mother 11  . Osteoarthritis Mother   . Hyperlipidemia Father   . Coronary artery disease Father        MI age 39  . Hyperlipidemia Brother    Family Psychiatric  History:  Social History:  Social History   Substance and Sexual Activity  Alcohol Use No     Social History   Substance and Sexual  Activity  Drug Use No    Social History   Socioeconomic History  . Marital status: Married    Spouse name: Not on file  . Number of children: Not on file  . Years of education: Not on file  . Highest education level: Not on file  Occupational History  . Not on file  Social Needs  . Financial resource strain: Not on file   . Food insecurity:    Worry: Not on file    Inability: Not on file  . Transportation needs:    Medical: Not on file    Non-medical: Not on file  Tobacco Use  . Smoking status: Never Smoker  . Smokeless tobacco: Never Used  . Tobacco comment: Married, lives with spouse  Substance and Sexual Activity  . Alcohol use: No  . Drug use: No  . Sexual activity: Yes    Partners: Male    Birth control/protection: Inserts    Comment: 1ST INTERCOURSE- 21, PARTNERS- 4   Lifestyle  . Physical activity:    Days per week: Not on file    Minutes per session: Not on file  . Stress: Not on file  Relationships  . Social connections:    Talks on phone: Not on file    Gets together: Not on file    Attends religious service: Not on file    Active member of club or organization: Not on file    Attends meetings of clubs or organizations: Not on file    Relationship status: Not on file  Other Topics Concern  . Not on file  Social History Narrative  . Not on file   Additional Social History:                         Sleep: Good  Appetite:  Good  Current Medications: Current Outpatient Medications  Medication Sig Dispense Refill  . Adalimumab (HUMIRA PEN) 40 MG/0.4ML PNKT Inject 1 pen into the skin every 14 (fourteen) days. 2 each 2  . ascorbic acid (VITAMIN C) 1000 MG tablet Take 1,000 mg by mouth daily.    . busPIRone (BUSPAR) 15 MG tablet 1  bid 60 tablet 5  . cetirizine (ZYRTEC) 10 MG tablet Take 1 tablet (10 mg total) by mouth daily. 30 tablet 3  . cholecalciferol (VITAMIN D) 1000 units tablet Take 5,000 Units by mouth daily.    . clonazePAM (KLONOPIN) 1 MG tablet Take 1 mg by mouth daily as needed for anxiety (take half a tablet as needed).    . Coenzyme Q10 (COQ-10) 200 MG CAPS Take 2 capsules by mouth daily. 400mg  daily.    Marland Kitchen escitalopram (LEXAPRO) 20 MG tablet Take 1 tablet (20 mg total) by mouth daily. 30 tablet 6  . esomeprazole (NEXIUM) 40 MG capsule Take 1 capsule (40  mg total) by mouth daily. 90 capsule 3  . etonogestrel-ethinyl estradiol (NUVARING) 0.12-0.015 MG/24HR vaginal ring Continuous use.  Remove and insert new ring every 4 weeks. 3 each 4  . gabapentin (NEURONTIN) 100 MG capsule Take 1 capsule (100 mg total) by mouth 2 (two) times daily. 60 capsule 3  . gabapentin (NEURONTIN) 400 MG capsule Take 1 capsule (400 mg total) by mouth at bedtime. 30 capsule 3  . Ibuprofen-Famotidine (DUEXIS) 800-26.6 MG TABS Take 800 mg by mouth 3 (three) times daily. 90 tablet 3  . leflunomide (ARAVA) 20 MG tablet Take 20 mg by mouth daily.    . meclizine (ANTIVERT)  25 MG tablet Take 1 tablet (25 mg total) by mouth 3 (three) times daily as needed for dizziness. Every 4-6 hours as needed. 30 tablet 0  . montelukast (SINGULAIR) 10 MG tablet Take 1 tablet (10 mg total) by mouth at bedtime. 90 tablet 3  . Multiple Vitamin (MULTIVITAMIN) tablet Take 1 tablet by mouth daily.      . nitroGLYCERIN (NITRODUR - DOSED IN MG/24 HR) 0.2 mg/hr patch 1/4 patch daily 30 patch 1  . Omega-3 Fatty Acids (FISH OIL) 1000 MG CAPS Take 2,000 mg by mouth 2 (two) times daily.     . ondansetron (ZOFRAN) 4 MG tablet Take 4 mg by mouth every 8 (eight) hours as needed for nausea or vomiting. 4-6 hours PRN    . Probiotic Product (SOLUBLE FIBER/PROBIOTICS PO) Take 2 capsules by mouth 2 (two) times daily.    . propranolol (INDERAL) 10 MG tablet 1 bid 60 tablet 5  . vitamin E 400 UNIT capsule Take 400 Units by mouth daily.    Marland Kitchen zolpidem (AMBIEN) 5 MG tablet Take 1 tablet (5 mg total) by mouth at bedtime as needed for sleep. 30 tablet 2  . amoxicillin-clavulanate (AUGMENTIN) 875-125 MG tablet Take 1 tablet by mouth 2 (two) times daily. (Patient not taking: Reported on 03/13/2018) 20 tablet 0  . Ashwagandha 500 MG CAPS Take 500 mg by mouth. Take 600 mg by mouth at bedtime.    . colchicine 0.6 MG tablet Take 1 tablet (0.6 mg total) by mouth 2 (two) times daily. (Patient not taking: Reported on 03/13/2018) 60  tablet 2  . Cranberry-Milk Thistle (LIVER & KIDNEY CLEANSER PO) Take by mouth 3 (three) times daily.    . cyclobenzaprine (FLEXERIL) 10 MG tablet Take 1 tablet (10 mg total) by mouth 3 (three) times daily as needed for muscle spasms. (Patient not taking: Reported on 03/13/2018) 30 tablet 0  . dextromethorphan-guaiFENesin (MUCINEX DM) 30-600 MG 12hr tablet Take 1 tablet by mouth 2 (two) times daily.    Marland Kitchen doxylamine, Sleep, (UNISOM) 25 MG tablet Take 25 mg by mouth at bedtime as needed. Taka half a tablet as needed    . predniSONE (DELTASONE) 20 MG tablet Take 2 tablets (40 mg total) by mouth daily with breakfast. (Patient not taking: Reported on 03/13/2018) 10 tablet 0  . traZODone (DESYREL) 50 MG tablet Take 0.5-1 tablets (25-50 mg total) by mouth at bedtime as needed for sleep. (Patient not taking: Reported on 03/13/2018) 30 tablet 3   No current facility-administered medications for this visit.     Lab Results:  No results found for this or any previous visit (from the past 48 hour(s)).  Blood Alcohol level:  No results found for: [facility]  Physical Findings: AIMS:  , ,  ,  ,    CIWA:    COWS:     Musculoskeletal: Strength & Muscle Tone: within normal limits Gait & Station: normal Patient leans: N/A  Psychiatric Specialty Exam: ROS  Blood pressure 124/81, pulse 86, height 5\' 10"  (1.778 m), weight 240 lb 3.2 oz (109 kg), SpO2 96 %.Body mass index is 34.47 kg/m.  General Appearance: Casual  Eye Contact::  Good  Speech:  Clear and Coherent  Volume:  Normal  Mood:  Euthymic  Affect:  Appropriate  Thought Process:  Coherent  Orientation:  Full (Time, Place, and Person)  Thought Content:  WDL  Suicidal Thoughts:  No  Homicidal Thoughts:  No  Memory:  NA  Judgement:  Good  Insight:  Good  Psychomotor Activity:  Normal  Concentration:  Good  Recall:  Good  Fund of Knowledge:Good  Language: Good  Akathisia:  No  Handed:  Right  AIMS (if indicated):     Assets:  Desire for  Improvement  ADL's:  Intact  Cognition: WNL  Sleep:      Treatment Plan Summary:  At this time the patient is very stable. She'll continue taking BuSpar 15 mg twice a day continue her Lexapro 20 mg. Today we talked about the pros and cons of these medications and how they're actually very safe for long-term use. Her weight is stable. She physically is doing well. She's agreed to return to see me in 8 months for a 30 minute reevaluation. Between now and then she'll see her primary care doctor and discuss about the possibility that they'll take over prescription of BuSpar and Lexapro. If her doctor agrees to take these over she'll call and cancel our appointment.At this time the patient is not dangerous to herself or anyone else. He is highly function.

## 2018-03-20 ENCOUNTER — Ambulatory Visit (HOSPITAL_COMMUNITY): Payer: 59 | Admitting: Psychiatry

## 2018-03-24 ENCOUNTER — Encounter: Payer: Self-pay | Admitting: Infectious Diseases

## 2018-03-24 ENCOUNTER — Ambulatory Visit (INDEPENDENT_AMBULATORY_CARE_PROVIDER_SITE_OTHER): Payer: 59 | Admitting: Infectious Diseases

## 2018-03-24 VITALS — BP 174/96 | HR 101 | Temp 98.7°F | Wt 241.0 lb

## 2018-03-24 DIAGNOSIS — Z452 Encounter for adjustment and management of vascular access device: Secondary | ICD-10-CM

## 2018-03-24 DIAGNOSIS — B965 Pseudomonas (aeruginosa) (mallei) (pseudomallei) as the cause of diseases classified elsewhere: Secondary | ICD-10-CM | POA: Diagnosis not present

## 2018-03-24 DIAGNOSIS — L089 Local infection of the skin and subcutaneous tissue, unspecified: Secondary | ICD-10-CM | POA: Diagnosis not present

## 2018-03-24 DIAGNOSIS — L739 Follicular disorder, unspecified: Secondary | ICD-10-CM

## 2018-03-24 DIAGNOSIS — L0291 Cutaneous abscess, unspecified: Secondary | ICD-10-CM | POA: Diagnosis not present

## 2018-03-24 MED FILL — LEFLUNOMIDE 20 MG TABLET: 20 | 30 days supply | Qty: 30 | Fill #0

## 2018-03-24 NOTE — Patient Instructions (Signed)
Will be in touch regarding placement of PICC line and referral to [facility].   Will start cefepime q12h (MIC is low at < 1)   Please send me an update 3 days in and if we need to can consider increasing your dose to q8h if needed. Plan to treat with 7-10 days   Will see you back prior to completing treatment to ensure it has improved.

## 2018-03-24 NOTE — Progress Notes (Signed)
Patient: MELIYA MCCONAHY  DOB: July 14, 1976 MRN: [id number] PCP: Biagio Borg, MD  Referring Provider: Hortencia Pilar, PA-C [facility] P#: [phone])  Chief Complaint  Patient presents with  . New Patient (Initial Visit)    pseudomonal folliculitis in patient on Humira      Patient Active Problem List   Diagnosis Date Noted  . Rheumatoid arthritis ([place]) 12/24/2017  . Trigger point of shoulder region, right 12/09/2017  . Lumbar paraspinal muscle spasm 11/13/2017  . Cervical radicular pain 09/18/2017  . Poor posture 08/14/2017  . Eustachian tube dysfunction 01/07/2017  . Chronic sinusitis 01/07/2017  . N&V (nausea and vomiting) 12/31/2016  . Vertigo 12/31/2016  . 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 Nonallopathic lesion of thoracic region 06/03/2016  . Nonallopathic lesion-rib cage 06/03/2016  . Nonallopathic lesion of cervical region 06/03/2016  . Folliculitis due to Pseudomonas aeruginosa 05/22/2016  . Acute left-sided thoracic back pain 05/22/2016  . Hyperglycemia 05/03/2016  . Preventative health care 05/04/2015  . Left hamstring injury 01/24/2015  . Major depressive disorder, recurrent episode, moderate ([place]) 12/02/2014  . Occipital neuralgia   . ANEMIA-NOS 12/11/2009  . Hyperlipidemia 11/16/2008  . Obesity 11/16/2008  . ANXIETY DEPRESSION 11/16/2008  . Allergic rhinitis 11/16/2008  . Esophageal reflux 11/16/2008  . Constipation 11/16/2008  . Palpitations 11/16/2008     Subjective:  DANELLA PHILSON is a 42 y.o. female here today for consideration of treatment of pseudomonal folliculitis/furuncles. She has a past medical history significant for RA - started on Humira roughly 8 weeks ago. Her RA symptoms are very well controlled on this however she has had trouble with folliculitis (mostly localized to her gluteal creases and hips but recently developed abscess to her left hip that required drainage. Culture was (+) sensitive pseudomonas She is  unable to take any fluoroquinolone . She has now had ongoing recurrent abscesses over the last 8 weeks. She has held further injections as of now to help clear this up and has been working with her dermatology team.   Review of Systems  Constitutional: Negative for chills, fever, malaise/fatigue and weight loss.  HENT: Negative for sore throat.        No dental problems  Respiratory: Negative for cough and sputum production.   Cardiovascular: Negative for chest pain and leg swelling.  Gastrointestinal: Negative for abdominal pain, diarrhea and vomiting.  Genitourinary: Negative for dysuria and flank pain.  Musculoskeletal: Negative for joint pain, myalgias and neck pain.  Skin: Positive for rash.  Neurological: Negative for dizziness, tingling and headaches.  Psychiatric/Behavioral: Negative for depression and substance abuse. The patient is not nervous/anxious and does not have insomnia.     Past Medical History:  Diagnosis Date  . ALLERGIC RHINITIS   . ANXIETY DEPRESSION   . CONSTIPATION   . DEPRESSION   . Esophageal reflux   . HYPERCHOLESTEROLEMIA   . OBESITY   . Occipital neuralgia   . Palpitations   . Rheumatoid arthritis ([place]) 12/24/2017    Outpatient Medications Prior to Visit  Medication Sig Dispense Refill  . Adalimumab (HUMIRA PEN) 40 MG/0.4ML PNKT Inject 1 pen into the skin every 14 (fourteen) days. 2 each 2  . amoxicillin-clavulanate (AUGMENTIN) 875-125 MG tablet Take 1 tablet by mouth 2 (two) times daily. 20 tablet 0  . ascorbic acid (VITAMIN C) 1000 MG tablet Take 1,000 mg by mouth daily.    . Ashwagandha 500 MG CAPS Take 500 mg by mouth. Take 600 mg by mouth at  bedtime.    . busPIRone (BUSPAR) 15 MG tablet 1  bid 60 tablet 5  . cetirizine (ZYRTEC) 10 MG tablet Take 1 tablet (10 mg total) by mouth daily. 30 tablet 3  . cholecalciferol (VITAMIN D) 1000 units tablet Take 5,000 Units by mouth daily.    . clonazePAM (KLONOPIN) 1 MG tablet Take 1 mg by mouth daily as  needed for anxiety (take half a tablet as needed).    . Coenzyme Q10 (COQ-10) 200 MG CAPS Take 2 capsules by mouth daily. 400mg  daily.    . colchicine 0.6 MG tablet Take 1 tablet (0.6 mg total) by mouth 2 (two) times daily. 60 tablet 2  . Cranberry-Milk Thistle (LIVER & KIDNEY CLEANSER PO) Take by mouth 3 (three) times daily.    . cyclobenzaprine (FLEXERIL) 10 MG tablet Take 1 tablet (10 mg total) by mouth 3 (three) times daily as needed for muscle spasms. 30 tablet 0  . dextromethorphan-guaiFENesin (MUCINEX DM) 30-600 MG 12hr tablet Take 1 tablet by mouth 2 (two) times daily.    Marland Kitchen doxylamine, Sleep, (UNISOM) 25 MG tablet Take 25 mg by mouth at bedtime as needed. Taka half a tablet as needed    . escitalopram (LEXAPRO) 20 MG tablet Take 1 tablet (20 mg total) by mouth daily. 30 tablet 6  . esomeprazole (NEXIUM) 40 MG capsule Take 1 capsule (40 mg total) by mouth daily. 90 capsule 3  . etonogestrel-ethinyl estradiol (NUVARING) 0.12-0.015 MG/24HR vaginal ring Continuous use.  Remove and insert new ring every 4 weeks. 3 each 4  . gabapentin (NEURONTIN) 100 MG capsule Take 1 capsule (100 mg total) by mouth 2 (two) times daily. 60 capsule 3  . gabapentin (NEURONTIN) 400 MG capsule Take 1 capsule (400 mg total) by mouth at bedtime. 30 capsule 3  . Ibuprofen-Famotidine (DUEXIS) 800-26.6 MG TABS Take 800 mg by mouth 3 (three) times daily. 90 tablet 3  . leflunomide (ARAVA) 20 MG tablet Take 20 mg by mouth daily.    . meclizine (ANTIVERT) 25 MG tablet Take 1 tablet (25 mg total) by mouth 3 (three) times daily as needed for dizziness. Every 4-6 hours as needed. 30 tablet 0  . montelukast (SINGULAIR) 10 MG tablet Take 1 tablet (10 mg total) by mouth at bedtime. 90 tablet 3  . Multiple Vitamin (MULTIVITAMIN) tablet Take 1 tablet by mouth daily.      . nitroGLYCERIN (NITRODUR - DOSED IN MG/24 HR) 0.2 mg/hr patch 1/4 patch daily 30 patch 1  . Omega-3 Fatty Acids (FISH OIL) 1000 MG CAPS Take 2,000 mg by mouth 2  (two) times daily.     . ondansetron (ZOFRAN) 4 MG tablet Take 4 mg by mouth every 8 (eight) hours as needed for nausea or vomiting. 4-6 hours PRN    . predniSONE (DELTASONE) 20 MG tablet Take 2 tablets (40 mg total) by mouth daily with breakfast. 10 tablet 0  . Probiotic Product (SOLUBLE FIBER/PROBIOTICS PO) Take 2 capsules by mouth 2 (two) times daily.    . propranolol (INDERAL) 10 MG tablet 1 bid 60 tablet 5  . traZODone (DESYREL) 50 MG tablet Take 0.5-1 tablets (25-50 mg total) by mouth at bedtime as needed for sleep. 30 tablet 3  . vitamin E 400 UNIT capsule Take 400 Units by mouth daily.    Marland Kitchen zolpidem (AMBIEN) 5 MG tablet Take 1 tablet (5 mg total) by mouth at bedtime as needed for sleep. 30 tablet 2   No facility-administered medications prior to visit.  Allergies  Allergen Reactions  . Ciprofloxacin     toxicity  . Levaquin [Levofloxacin] Anxiety    Possible CNS side effects. TOXICITY  . Moxifloxacin     TOXICITY    Social History   Tobacco Use  . Smoking status: Never Smoker  . Smokeless tobacco: Never Used  . Tobacco comment: Married, lives with spouse  Substance Use Topics  . Alcohol use: No  . Drug use: No    Family History  Problem Relation Age of Onset  . Hypertension Mother   . Diabetes Mother   . Hyperlipidemia Mother   . Breast cancer Mother 66  . Osteoarthritis Mother   . Hyperlipidemia Father   . Coronary artery disease Father        MI age 82  . Hyperlipidemia Brother     Objective:   Vitals:   03/24/18 1551  BP: (!) 174/96  Pulse: (!) 101  Temp: 98.7 F (37.1 C)  TempSrc: Oral  Weight: 241 lb (109.3 kg)   Body mass index is 34.58 kg/m.  Physical Exam  Constitutional: She is oriented to person, place, and time. She appears well-developed and well-nourished.  Seated comfortably in chair. Her husband accompanies [place].   HENT:  Mouth/Throat: Mucous membranes are normal. No oral lesions. Normal dentition. No dental abscesses. No  oropharyngeal exudate.  Cardiovascular: Normal rate, regular rhythm and normal heart sounds.  Pulmonary/Chest: Effort normal and breath sounds normal. No respiratory distress.  Abdominal: Soft. She exhibits no distension. There is no tenderness.  Lymphadenopathy:    She has no cervical adenopathy.  Neurological: She is alert and oriented to person, place, and time.  Skin: Skin is warm and dry. No rash noted.     Psychiatric: She has a normal mood and affect. Judgment normal.  In good spirits today. Nervous.      Left Inframammary fold/breast. Small area of circumscribed erythema with open center after rupture.     Lab Results:     Lab Results  Component Value Date   WBC 6.8 05/06/2017   HGB 13.1 05/06/2017   HCT 39.6 05/06/2017   MCV 86.1 05/06/2017   PLT 339.0 05/06/2017    Lab Results  Component Value Date   CREATININE 0.69 05/06/2017   BUN 8 05/06/2017   NA 140 05/06/2017   K 4.3 05/06/2017   CL 106 05/06/2017   CO2 27 05/06/2017    Lab Results  Component Value Date   ALT 13 05/06/2017   AST 12 05/06/2017   ALKPHOS 58 05/06/2017   BILITOT 0.3 05/06/2017     Assessment & Plan:   Problem List Items Addressed This Visit      Musculoskeletal and Integument   Folliculitis due to Pseudomonas aeruginosa - Primary    Patient receiving Humira for RA with rash consistent with folliculitis considering involvement of intertriginous areas; however there are some uncharacteristic qualities (abscess formation requiring drainage, isolated lesions almost with appearance of erythema nodosum on the lower legs). She has had severe CNS toxic effect with use of Levaquin in the past. Pseudomonas recovered from the abscess on the left hip is pan sensitive. We discussed today treatment options and will place PICC line to start Cefepime through IV to help clear up infection considering the atypical presentation and ongoing abscess formations to try to clear this up.   She has had  this rash for the last 6 weeks of the 8 weeks of being on Humira. We discussed today that if she continues  to have these eruptions consideration for alternative treatment for RA should be discussed. She is having good effect with Humira and would like to continue if at all possible.   Interestingly she has had some nail discoloration she told me is due to spontaneous hemorrhage under the nailbed on the left great toe and to a lesser extent the right. ?pseudomonas nail involvement.   I called her dermatology team to update them about plan/findings. Not much in the literature regarding "decolonization" attempt for pseudomonas as it is not a typically encountered skin organism but a/w nosocomial exposure (she is a Software engineer at [employer]) or water/soil exposure. Discussed single use wash cloths/towels, pump bottle hygiene products (no jars), frequent changing of sheets/night time garments, prompt changing of wet clothes/underwear.       Relevant Medications   gentamicin cream (GARAMYCIN) 0.1 %   cephALEXin (KEFLEX) 500 MG capsule    Other Visit Diagnoses    Needs peripherally inserted central catheter (PICC)       Relevant Orders   IR PICC REPLACEMENT RIGHT INC IMG GUIDE      ADDENDUM:  Unable to place PICC until next week d/t constraints of outpatient IR schedule. Will send in gentamicin cream to use TID to areas that are evolving significantly and Keflex 7d course if she develops any surrounding cellulitis to have on hand at the beach prior to PICC line placement. Advised to try Gent cream prior to leaving so she can assess tolerability (pseudomonas was sensitive to this per culture report). PICC to be placed on 5/20 for infusions when she returns.   Janene Madeira, MSN, NP-C [facility] [place] Pager: [phone] Office: [phone]  03/27/18  2:34 PM

## 2018-03-25 ENCOUNTER — Telehealth: Payer: Self-pay | Admitting: *Deleted

## 2018-03-25 DIAGNOSIS — M0589 Other rheumatoid arthritis with rheumatoid factor of multiple sites: Secondary | ICD-10-CM | POA: Diagnosis not present

## 2018-03-25 NOTE — Telephone Encounter (Addendum)
Called IR at [facility] to schedule the patient for a PICC placement and had to leave a message for them to call me back.  Faxed information to [facility] for the insurance for the PICC called and it was received. Advised will let them know when the PICC is scheduled so they can make arrangements with the patient.

## 2018-03-26 ENCOUNTER — Encounter: Payer: Self-pay | Admitting: Infectious Diseases

## 2018-03-26 MED ORDER — CEPHALEXIN 500 MG PO CAPS: 500.0000 mg | ORAL_CAPSULE | Freq: Four times a day (QID) | ORAL | 0 refills | Status: AC

## 2018-03-26 MED ORDER — GENTAMICIN SULFATE 0.1 % EX CREA: 1.0000 "application " | TOPICAL_CREAM | Freq: Three times a day (TID) | CUTANEOUS | 2 refills | Status: DC

## 2018-03-26 MED FILL — CEPHALEXIN 500 MG CAPSULE: 500 | 7 days supply | Qty: 28 | Fill #0

## 2018-03-26 MED FILL — GENTAMICIN 0.1% CREAM: 0.1 | 20 days supply | Qty: 30 | Fill #0

## 2018-03-26 NOTE — Telephone Encounter (Addendum)
Called April [name] at [facility] and was able to schedule the patient for her PICC placement 03/30/18 at 10 arrive at 930. Advised [facility] of her day and time.  Called the patient and advised her of the day and time and she advised she is going to the beach on 03/29/18 returning on 04/03/18 so she can not have it placed until she returns.  Called IR and was able to change her appt to 04/06/18 at 10 arrive at 930. Called and advised [facility] of the change and patient notified as well.

## 2018-03-27 DIAGNOSIS — H10413 Chronic giant papillary conjunctivitis, bilateral: Secondary | ICD-10-CM | POA: Diagnosis not present

## 2018-03-27 DIAGNOSIS — H16041 Marginal corneal ulcer, right eye: Secondary | ICD-10-CM | POA: Diagnosis not present

## 2018-03-27 MED FILL — PROLENSA 0.07% EYE DROPS: 0.07 | 60 days supply | Qty: 3 | Fill #0

## 2018-03-27 MED FILL — POLYMYXIN B/TMP EYE DROPS: 10000-0.1 | 30 days supply | Qty: 10 | Fill #0

## 2018-03-27 NOTE — Assessment & Plan Note (Signed)
Patient receiving Humira for RA with rash consistent with folliculitis considering involvement of intertriginous areas; however there are some uncharacteristic qualities (abscess formation requiring drainage, isolated lesions almost with appearance of erythema nodosum on the lower legs). She has had severe CNS toxic effect with use of Levaquin in the past. Pseudomonas recovered from the abscess on the left hip is pan sensitive. We discussed today treatment options and will place PICC line to start Cefepime through IV to help clear up infection considering the atypical presentation and ongoing abscess formations to try to clear this up.   She has had this rash for the last 6 weeks of the 8 weeks of being on Humira. We discussed today that if she continues to have these eruptions consideration for alternative treatment for RA should be discussed. She is having good effect with Humira and would like to continue if at all possible.   Interestingly she has had some nail discoloration she told me is due to spontaneous hemorrhage under the nailbed on the left great toe and to a lesser extent the right. ?pseudomonas nail involvement.   I called her dermatology team to update them about plan/findings. Not much in the literature regarding "decolonization" attempt for pseudomonas as it is not a typically encountered skin organism but a/w nosocomial exposure (she is a Software engineer at [employer]) or water/soil exposure. Discussed single use wash cloths/towels, pump bottle hygiene products (no jars), frequent changing of sheets/night time garments, prompt changing of wet clothes/underwear.

## 2018-03-30 ENCOUNTER — Ambulatory Visit (HOSPITAL_COMMUNITY): Payer: 59

## 2018-03-30 DIAGNOSIS — H16141 Punctate keratitis, right eye: Secondary | ICD-10-CM | POA: Diagnosis not present

## 2018-04-02 DIAGNOSIS — H16041 Marginal corneal ulcer, right eye: Secondary | ICD-10-CM | POA: Diagnosis not present

## 2018-04-03 DIAGNOSIS — H10413 Chronic giant papillary conjunctivitis, bilateral: Secondary | ICD-10-CM | POA: Diagnosis not present

## 2018-04-03 DIAGNOSIS — H16041 Marginal corneal ulcer, right eye: Secondary | ICD-10-CM | POA: Diagnosis not present

## 2018-04-06 ENCOUNTER — Encounter: Payer: Self-pay | Admitting: Infectious Diseases

## 2018-04-06 ENCOUNTER — Ambulatory Visit (HOSPITAL_COMMUNITY)
Admission: RE | Admit: 2018-04-06 | Discharge: 2018-04-06 | Disposition: A | Discharge status: Home / Self Care | Payer: 59 | Source: Ambulatory Visit | Attending: Infectious Diseases | Admitting: Infectious Diseases

## 2018-04-06 ENCOUNTER — Ambulatory Visit (HOSPITAL_COMMUNITY): Payer: 59

## 2018-04-06 DIAGNOSIS — E002 Congenital iodine-deficiency syndrome, mixed type: Secondary | ICD-10-CM | POA: Diagnosis not present

## 2018-04-06 DIAGNOSIS — Z452 Encounter for adjustment and management of vascular access device: Secondary | ICD-10-CM

## 2018-04-06 DIAGNOSIS — Z6834 Body mass index (BMI) 34.0-34.9, adult: Secondary | ICD-10-CM | POA: Insufficient documentation

## 2018-04-06 DIAGNOSIS — Z8249 Family history of ischemic heart disease and other diseases of the circulatory system: Secondary | ICD-10-CM | POA: Insufficient documentation

## 2018-04-06 DIAGNOSIS — Z881 Allergy status to other antibiotic agents status: Secondary | ICD-10-CM | POA: Diagnosis not present

## 2018-04-06 DIAGNOSIS — Z8261 Family history of arthritis: Secondary | ICD-10-CM | POA: Insufficient documentation

## 2018-04-06 DIAGNOSIS — K219 Gastro-esophageal reflux disease without esophagitis: Secondary | ICD-10-CM | POA: Diagnosis not present

## 2018-04-06 DIAGNOSIS — M069 Rheumatoid arthritis, unspecified: Secondary | ICD-10-CM | POA: Diagnosis not present

## 2018-04-06 DIAGNOSIS — Z79899 Other long term (current) drug therapy: Secondary | ICD-10-CM | POA: Diagnosis not present

## 2018-04-06 DIAGNOSIS — Z888 Allergy status to other drugs, medicaments and biological substances status: Secondary | ICD-10-CM | POA: Diagnosis not present

## 2018-04-06 DIAGNOSIS — L739 Follicular disorder, unspecified: Secondary | ICD-10-CM | POA: Diagnosis not present

## 2018-04-06 DIAGNOSIS — B965 Pseudomonas (aeruginosa) (mallei) (pseudomallei) as the cause of diseases classified elsewhere: Secondary | ICD-10-CM | POA: Insufficient documentation

## 2018-04-06 DIAGNOSIS — E785 Hyperlipidemia, unspecified: Secondary | ICD-10-CM | POA: Diagnosis not present

## 2018-04-06 DIAGNOSIS — F418 Other specified anxiety disorders: Secondary | ICD-10-CM | POA: Insufficient documentation

## 2018-04-06 DIAGNOSIS — E669 Obesity, unspecified: Secondary | ICD-10-CM | POA: Diagnosis not present

## 2018-04-06 MED ORDER — LIDOCAINE HCL (PF) 1 % IJ SOLN
INTRAMUSCULAR | Status: DC | PRN
  Administered 2018-04-06: 5 mL

## 2018-04-06 MED ORDER — LIDOCAINE HCL 1 % IJ SOLN
INTRAMUSCULAR | Status: AC
  Filled 2018-04-06: qty 20

## 2018-04-06 MED ORDER — HEPARIN SOD (PORK) LOCK FLUSH 100 UNIT/ML IV SOLN: 500.0000 [IU] | Freq: Once | INTRAVENOUS | Status: DC

## 2018-04-06 MED ORDER — SODIUM CHLORIDE 0.9 % IV SOLN
2.0000 g | INTRAVENOUS | Status: AC
  Administered 2018-04-06: 2 g via INTRAVENOUS
  Filled 2018-04-06: qty 2

## 2018-04-06 MED ORDER — HEPARIN SOD (PORK) LOCK FLUSH 100 UNIT/ML IV SOLN
250.0000 [IU] | Freq: Once | INTRAVENOUS | Status: AC
  Administered 2018-04-06: 250 [IU] via INTRAVENOUS

## 2018-04-06 MED ORDER — SODIUM CHLORIDE 0.9% FLUSH
10.0000 mL | Freq: Once | INTRAVENOUS | Status: AC
  Administered 2018-04-06: 10 mL via INTRAVENOUS

## 2018-04-06 MED ORDER — HEPARIN SOD (PORK) LOCK FLUSH 100 UNIT/ML IV SOLN
INTRAVENOUS | Status: AC
  Administered 2018-04-06: 250 [IU] via INTRAVENOUS
  Filled 2018-04-06: qty 5

## 2018-04-06 NOTE — Procedures (Signed)
RUE PICC SVC RA 42 cm EBL 0 Comp 0

## 2018-04-07 ENCOUNTER — Other Ambulatory Visit: Payer: Self-pay | Admitting: Infectious Diseases

## 2018-04-07 DIAGNOSIS — Z6834 Body mass index (BMI) 34.0-34.9, adult: Secondary | ICD-10-CM | POA: Diagnosis not present

## 2018-04-07 DIAGNOSIS — Z79899 Other long term (current) drug therapy: Secondary | ICD-10-CM | POA: Diagnosis not present

## 2018-04-07 DIAGNOSIS — L739 Follicular disorder, unspecified: Secondary | ICD-10-CM | POA: Diagnosis not present

## 2018-04-07 DIAGNOSIS — Z452 Encounter for adjustment and management of vascular access device: Secondary | ICD-10-CM

## 2018-04-07 DIAGNOSIS — R42 Dizziness and giddiness: Secondary | ICD-10-CM | POA: Diagnosis not present

## 2018-04-07 DIAGNOSIS — E669 Obesity, unspecified: Secondary | ICD-10-CM | POA: Diagnosis not present

## 2018-04-07 DIAGNOSIS — E11621 Type 2 diabetes mellitus with foot ulcer: Secondary | ICD-10-CM | POA: Diagnosis not present

## 2018-04-07 MED FILL — busPIRone HCL 15 MG TABS: 15 | 30 days supply | Qty: 60 | Fill #1

## 2018-04-07 MED FILL — ESCITALOPRAM 20 MG TABLET: 20 | 30 days supply | Qty: 30 | Fill #1

## 2018-04-08 ENCOUNTER — Telehealth: Payer: Self-pay | Admitting: Internal Medicine

## 2018-04-08 NOTE — Telephone Encounter (Signed)
Copied from [place]. Topic: General - Other >> Apr 08, 2018  9:07 AM Yvette Rack wrote: Reason for CRM: Nurse Amy from [facility] [phone] calling to see if Dr Jenny Reichmann will accept approval for plan of care  pt is needing visits from Nurse to have visits 1 time a week for 2 weeks for labs and picc line care

## 2018-04-08 NOTE — Telephone Encounter (Signed)
Notified amy w/MD response../lmb 

## 2018-04-08 NOTE — Telephone Encounter (Signed)
Yes, ok for verbals 

## 2018-04-09 ENCOUNTER — Encounter: Payer: Self-pay | Admitting: Infectious Diseases

## 2018-04-09 ENCOUNTER — Telehealth: Payer: Self-pay | Admitting: Pharmacist

## 2018-04-09 NOTE — Telephone Encounter (Addendum)
Per [place], called [facility] and spoke to [place] to give verbal order to increase patient's cefepime dose from q12h to q8h.  Also gave orders to keep PICC until follow up with [facility]. Jeani Hawking verbalized understanding.

## 2018-04-10 DIAGNOSIS — H10413 Chronic giant papillary conjunctivitis, bilateral: Secondary | ICD-10-CM | POA: Diagnosis not present

## 2018-04-10 DIAGNOSIS — H16041 Marginal corneal ulcer, right eye: Secondary | ICD-10-CM | POA: Diagnosis not present

## 2018-04-13 ENCOUNTER — Encounter: Payer: Self-pay | Admitting: Infectious Diseases

## 2018-04-14 ENCOUNTER — Encounter: Payer: Self-pay | Admitting: Infectious Diseases

## 2018-04-14 DIAGNOSIS — E11628 Type 2 diabetes mellitus with other skin complications: Secondary | ICD-10-CM | POA: Diagnosis not present

## 2018-04-14 DIAGNOSIS — Z79899 Other long term (current) drug therapy: Secondary | ICD-10-CM | POA: Diagnosis not present

## 2018-04-14 DIAGNOSIS — E669 Obesity, unspecified: Secondary | ICD-10-CM | POA: Diagnosis not present

## 2018-04-14 DIAGNOSIS — E11621 Type 2 diabetes mellitus with foot ulcer: Secondary | ICD-10-CM | POA: Diagnosis not present

## 2018-04-14 DIAGNOSIS — L739 Follicular disorder, unspecified: Secondary | ICD-10-CM | POA: Diagnosis not present

## 2018-04-14 DIAGNOSIS — R42 Dizziness and giddiness: Secondary | ICD-10-CM | POA: Diagnosis not present

## 2018-04-14 DIAGNOSIS — Z6834 Body mass index (BMI) 34.0-34.9, adult: Secondary | ICD-10-CM | POA: Diagnosis not present

## 2018-04-14 NOTE — Telephone Encounter (Signed)
Called and spoke to Coretta to extend patient's IV cefepime for another 7 days until next Monday 6/3 per [place]. Coretta verbalized understanding.

## 2018-04-14 NOTE — Telephone Encounter (Signed)
Thank you, Cassie!

## 2018-04-16 ENCOUNTER — Ambulatory Visit (INDEPENDENT_AMBULATORY_CARE_PROVIDER_SITE_OTHER): Payer: 59 | Admitting: Infectious Diseases

## 2018-04-16 ENCOUNTER — Encounter: Payer: Self-pay | Admitting: Infectious Diseases

## 2018-04-16 VITALS — BP 148/97 | HR 86 | Temp 98.2°F | Ht 70.0 in | Wt 238.2 lb

## 2018-04-16 DIAGNOSIS — Z452 Encounter for adjustment and management of vascular access device: Secondary | ICD-10-CM

## 2018-04-16 DIAGNOSIS — L739 Follicular disorder, unspecified: Secondary | ICD-10-CM | POA: Diagnosis not present

## 2018-04-16 DIAGNOSIS — B965 Pseudomonas (aeruginosa) (mallei) (pseudomallei) as the cause of diseases classified elsewhere: Secondary | ICD-10-CM | POA: Diagnosis not present

## 2018-04-16 DIAGNOSIS — M069 Rheumatoid arthritis, unspecified: Secondary | ICD-10-CM | POA: Diagnosis not present

## 2018-04-16 DIAGNOSIS — L700 Acne vulgaris: Secondary | ICD-10-CM | POA: Diagnosis not present

## 2018-04-16 MED ORDER — DOXYCYCLINE HYCLATE 100 MG PO TABS: 100.0000 mg | ORAL_TABLET | Freq: Two times a day (BID) | ORAL | 0 refills | Status: AC

## 2018-04-16 MED FILL — DOXYCYCLINE HYCLATE 100 MG: 100 | 7 days supply | Qty: 14 | Fill #0

## 2018-04-16 NOTE — Patient Instructions (Addendum)
Will try to add some doxycycline to the cefepime for 7 days to see if this makes any difference incase there is a MRSA component to some of the new pustules. Overall I am very pleased about the response. It seems like the areas are healing up well.   Continue the PICC line/Cefepime until next week as planned then can remove it.   Send me an update on MyChart Monday or so to let me know how things are looking.   I question if the ones on your lower legs are something different - I will call your dermatology team to discuss with them  Benzyl peroxide up to 10% I would recommend for your face wash.    Bleach Bath Recipe:   Add  -  cup of common 5% household bleach to a bathtub full of water.   Soak your torso or just the affected part of your skin for about 15 minutes.   Limit diluted bleach baths to no more than 3 times a week.   Do not submerge your head and be very careful to avoid getting the diluted bleach into the eyes.   Rinse off with fresh water and apply moisturizer.   [place] for Infectious Diseases Five day Treatment Plan for MRSA (Staph) Decolonization  Please let us know if you have questions or concerns or do not understand the information we give you.  Prepare Chose a period when you will be uninterrupted by going away or other distractions. To ensure that your skin is in good condition, follow the Routine Skin Care principles below to reduce drying and enhance healing. Do not start while you have any active boils.  Routine Skin Care principles to reduce drying and enhance healing:  Avoid the use of soap when bathing or showering. DO NOT routinely use antiseptic solutions. If you need to use something, chose a soap substitute (examples -QV Wash or Cetaphil).   When drying with a towel, be gentle and pat dry your skin. Avoid rubbing the skin.   Reduce the overall frequency of bathing or showering. A short shower (3 minutes) is better than a bath in terms of  its effect on the skin.   Use a simple sorbelene based-cream on your skin prior to showering and immediately after drying. (Examples: Hydroderm or other Sorbelene-based preparations). Especially protect healing or dry areas of skin in this way. Don't use a barrier cream with a vaseline base or with perfumes and additives. You are more likely to be allergic to these products, and cause further damage to your skin.   Make sure that you clean and cover any skin cuts or grazes that occur. Try to avoid picking or biting fingernails and the skin around the nails Keep your fingernails clipped short and clean to reduce problems caused by scratching.   For itchy skin, try gently massaging sorbelene-based cream into itchy areas instead of scratching it. A long-acting non-sedating antihistamine drug is the next option to try. However make sure that you are not taking medication that will interact with this drug type.                                                                         To  prepare yourself for your treatment, it is recommended that you complete the following steps:  Remove nose, ear and other body piercing items for several days prior to the treatment and keep them out during the treatment period   Purchase a new toothbrush, disposable razor (if used), sterident for dentures (if required) and a container of alcohol hand hygiene solution (gel or rub)   Discard old toothbrushes and razors when the treatment starts. Also discard opened deodorant rollers, skin adhesive tapes, skin creams and solutions- all of these may already be contaminated with staph   Discard pumice stone(s), sponges and disposable face cloths if used    Discard all make-up brushes, creams, and implements   Discard or hot wash all fluffy toys   Wash hair brush and comb, nail files, plastic toys and cutters in the dishwasher or purchase new ones   Remove nail varnish and artificial nails   Daily routine for 5 days      *Minimize contact with members of the community during the 5 days of treatment  Body washes The effectiveness of the program increases if the correct procedure is used:  Apply the provided antiseptic body wash (2% chlorhexidine) in the shower daily   Take care to wash hair, under the arms and into the groin and into any folds of skin   Allow the antiseptic to remain on the skin for at least 5 minutes   Nasal ointment  Disinfect your hands with alcohol gel/rub and allow to dry    Open the mupirocin 2% (Bactroban) nasal ointment.   Place small amount (size of match head) of ointment onto a clean cotton swab and massage gently around the inside of the nostril on one side, making sure not to insert it too deeply (no more than 2 cm or a little less than an inch).   Use a new cotton bud for the other nostril so that you do not contaminate the Bactroban tube with staph.    After applying the ointment, press a finger against the nose next to the nostril opening and use a circular motion to spread the ointment within the nose   Apply the mupirocin ointment two times a day for 5 days   Disinfect your hands with alcohol rub/gel after applying the ointment  Apply under each fingernail twice a day with q-tip   Personal items (combs, razor, eyeglasses, jewelry, etc.)     Disinfect all personal items daily with an alcohol-based cleanser   House Environment and Clothes/Linens  On day 2 and 5 of the treatment, clean your house, (especially the bedroom and bathroom). Clean dust off all surfaces and then vacuum clean all floor surfaces AND soft furnishings (such as your favorite chair). If your chair/couch has a vinyl or leather covering then wipe over the chair with warm soapy water and then dry with a clean towel (which should then be washed). Staph lives in skin scales from humans that contaminate the environment. This can lead to re-infection.    Disinfect the shower floor and/or bath tub daily     On days 1, 3, AND 5 of the treatment wash your clothes, underwear, pajamas and bed linen (such as towels, sheets, washcloths, and bath mats). A hot wash with laundry detergent is best (there is no need to use expensive laundry detergent or powder). Dry clothes in sun if possible. Change into clean clothes or pajamas on those days after your shower.    Do not share or exchange any  personal items of clothing   Sports/Gym     Surfaces, equipment and towels, and skin-to-skin contact are all potential sources for staph re-infection  Pets Dogs and other companion animals can also be colonized with the same strains of MRSA. Best to wash or replace bedding material for the animal and wash the animal at least once during the treatment period with antiseptic solution (2% chlorhexidine wash). Ensure that the skin of the animal is kept in good condition. If the pet has any chronic skin disorders, consult your vet prior to starting your treatment process.  What about my partner, family, or household members? Usually when an aggressive strain of staph moves in to a family or household, only certain members of that group get infections (boils). This is despite the fact that the strain has probably transferred itself from person to person within the group. Those without boils may also be carrying the bacterium, however they must have better resistance (immunity) or perhaps have better skin condition. Staph likes to invade through cuts, scratches and skin with dermatitis or dryness.    Follow-up after decolonization treatment  Possible approaches will vary depending on how your treatment goes. Your provider will instruct you on the follow-up best suited for you. Some options are:   Wait and see - if no further boils occur within 6 months then it is probable that the strain of staph has been eliminated from you.    Continue intermittent body washes 1-2 times per week with 2% aqueous chlorhexidine soap  preparation or similar    Future antibiotic use  The best preventative approach to avoiding future problems may be to avoid use of antibiotics unless there is a strong indication. Antibiotics are often prescribed for minor infections or for respiratory infections that are mostly due to viruses. It is in your best interest to ride these infections out rather than taking antibiotics. Taking antibiotics alters your natural bacterial flora on your skin and in your gut. This may reduce your resistance against acquiring a resistant bacterial strain such as MRSA).   Important note: if you do become very ill with possible infection and require hospital review, it is important for you to remind your medical care providers that you have been colonized with MRSA in the past as they may have to use antibiotics that are active against the strain that you had previously.   References Wiese-Posselt et al. Clin Inf Dis 3354:[id number]  CDC:  StyleTurn.tn.html

## 2018-04-16 NOTE — Progress Notes (Signed)
Patient: April [name]  DOB: September 14, 1976 MRN: [id number] PCP: Biagio Borg, MD  Referring Provider: Hortencia Pilar, PA-C [facility] P#: [phone])   Chief Complaint  Patient presents with  . Follow-up    pseudomonal folliculitis/abscesses     Subjective:  April [name] is a 42 y.o. female here today for follow up of her folliculitis/furuncles with known sensitive pseudomonas involvement. Her husband joins her today. 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 [name] has done well with PICC medication administrations and maintenance. Some mild reactions to tape and is awaiting hypoallergenic dressing materials. Since increasing cefepime to TID dosing she reports continued improvement in nearly all previous abscess sites; she does however have one spot on her right hip that is still a little tender and firm; wondering if this would require drainage. She still has a few small bumps under breast and on backside near gluteal folds but tells me she feels as if this is getting better. The last abscess that was drained prior to initiating IV cefepime did not yield any growth after she contacted her dermatology team. She still has a few lingering areas on her lower extremities that are not quite the same in quality as the ones on her backside/underside of her breasts. Slightly painful and more red with nodular center. Only other thing she has noticed has been increasing "breaking out" on her chin/forehead. No change in make up, skin cleaners, moisturizers or other cosmetic products.   Humira still on hold.   Review of Systems  All other systems reviewed and are negative.  Patient Active Problem List   Diagnosis Date Noted  . PICC (peripherally inserted central catheter) in place 04/19/2018  . 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 Rheumatoid arthritis ([place]) 12/24/2017  . Trigger point of shoulder region, right  12/09/2017  . Lumbar paraspinal muscle spasm 11/13/2017  . Cervical radicular pain 09/18/2017  . Poor posture 08/14/2017  . Eustachian tube dysfunction 01/07/2017  . Chronic sinusitis 01/07/2017  . Vertigo 12/31/2016  . 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 Nonallopathic lesion of thoracic region 06/03/2016  . Nonallopathic lesion-rib cage 06/03/2016  . Nonallopathic lesion of cervical region 06/03/2016  . Folliculitis due to Pseudomonas aeruginosa 05/22/2016  . Acute left-sided thoracic back pain 05/22/2016  . Hyperglycemia 05/03/2016  . Preventative health care 05/04/2015  . Left hamstring injury 01/24/2015  . Major depressive disorder, recurrent episode, moderate ([place]) 12/02/2014  . Occipital neuralgia   . ANEMIA-NOS 12/11/2009  . Hyperlipidemia 11/16/2008  . Obesity 11/16/2008  . ANXIETY DEPRESSION 11/16/2008  . Allergic rhinitis 11/16/2008  . Esophageal reflux 11/16/2008  . Constipation 11/16/2008  . Palpitations 11/16/2008   Past Medical History:  Diagnosis Date  . ALLERGIC RHINITIS   . ANXIETY DEPRESSION   . CONSTIPATION   . DEPRESSION   . Esophageal reflux   . HYPERCHOLESTEROLEMIA   . OBESITY   . Occipital neuralgia   . Palpitations   . Rheumatoid arthritis ([place]) 12/24/2017    Outpatient Medications Prior to Visit  Medication Sig Dispense Refill  . Adalimumab (HUMIRA PEN) 40 MG/0.4ML PNKT Inject 1 pen into the skin every 14 (fourteen) days. 2 each 2  . ascorbic acid (VITAMIN C) 1000 MG tablet Take 1,000 mg by mouth daily.    . busPIRone (BUSPAR) 15 MG tablet 1  bid 60 tablet 5  . cetirizine (ZYRTEC) 10 MG tablet Take 1 tablet (10 mg total) by mouth daily.  30 tablet 3  . cholecalciferol (VITAMIN D) 1000 units tablet Take 5,000 Units by mouth daily.    Marland Kitchen escitalopram (LEXAPRO) 20 MG tablet Take 1 tablet (20 mg total) by mouth daily. 30 tablet 6  . esomeprazole (NEXIUM) 40 MG capsule Take 1 capsule (40 mg total) by mouth daily. 90 capsule 3  . etonogestrel-ethinyl  estradiol (NUVARING) 0.12-0.015 MG/24HR vaginal ring Continuous use.  Remove and insert new ring every 4 weeks. 3 each 4  . gabapentin (NEURONTIN) 100 MG capsule Take 1 capsule (100 mg total) by mouth 2 (two) times daily. 60 capsule 3  . gentamicin cream (GARAMYCIN) 0.1 % Apply 1 application topically 3 (three) times daily. 30 g 2  . Ibuprofen-Famotidine (DUEXIS) 800-26.6 MG TABS Take 800 mg by mouth 3 (three) times daily. 90 tablet 3  . leflunomide (ARAVA) 20 MG tablet Take 20 mg by mouth daily.    . montelukast (SINGULAIR) 10 MG tablet Take 1 tablet (10 mg total) by mouth at bedtime. 90 tablet 3  . Multiple Vitamin (MULTIVITAMIN) tablet Take 1 tablet by mouth daily.      . Omega-3 Fatty Acids (FISH OIL) 1000 MG CAPS Take 2,000 mg by mouth 2 (two) times daily.     . Probiotic Product (SOLUBLE FIBER/PROBIOTICS PO) Take 2 capsules by mouth 2 (two) times daily.    . vitamin E 400 UNIT capsule Take 400 Units by mouth daily.    Marland Kitchen zolpidem (AMBIEN) 5 MG tablet Take 1 tablet (5 mg total) by mouth at bedtime as needed for sleep. 30 tablet 2  . amoxicillin-clavulanate (AUGMENTIN) 875-125 MG tablet Take 1 tablet by mouth 2 (two) times daily. 20 tablet 0  . Ashwagandha 500 MG CAPS Take 500 mg by mouth. Take 600 mg by mouth at bedtime.    . clonazePAM (KLONOPIN) 1 MG tablet Take 1 mg by mouth daily as needed for anxiety (take half a tablet as needed).    . Coenzyme Q10 (COQ-10) 200 MG CAPS Take 2 capsules by mouth daily. 400mg  daily.    . colchicine 0.6 MG tablet Take 1 tablet (0.6 mg total) by mouth 2 (two) times daily. (Patient not taking: Reported on 04/16/2018) 60 tablet 2  . Cranberry-Milk Thistle (LIVER & KIDNEY CLEANSER PO) Take by mouth 3 (three) times daily.    . cyclobenzaprine (FLEXERIL) 10 MG tablet Take 1 tablet (10 mg total) by mouth 3 (three) times daily as needed for muscle spasms. (Patient not taking: Reported on 04/16/2018) 30 tablet 0  . dextromethorphan-guaiFENesin (MUCINEX DM) 30-600 MG  12hr tablet Take 1 tablet by mouth 2 (two) times daily.    Marland Kitchen doxylamine, Sleep, (UNISOM) 25 MG tablet Take 25 mg by mouth at bedtime as needed. Taka half a tablet as needed    . gabapentin (NEURONTIN) 400 MG capsule Take 1 capsule (400 mg total) by mouth at bedtime. 30 capsule 3  . meclizine (ANTIVERT) 25 MG tablet Take 1 tablet (25 mg total) by mouth 3 (three) times daily as needed for dizziness. Every 4-6 hours as needed. 30 tablet 0  . nitroGLYCERIN (NITRODUR - DOSED IN MG/24 HR) 0.2 mg/hr patch 1/4 patch daily (Patient not taking: Reported on 04/16/2018) 30 patch 1  . ondansetron (ZOFRAN) 4 MG tablet Take 4 mg by mouth every 8 (eight) hours as needed for nausea or vomiting. 4-6 hours PRN    . predniSONE (DELTASONE) 20 MG tablet Take 2 tablets (40 mg total) by mouth daily with breakfast. 10 tablet 0  .  propranolol (INDERAL) 10 MG tablet 1 bid (Patient not taking: Reported on 04/16/2018) 60 tablet 5  . traZODone (DESYREL) 50 MG tablet Take 0.5-1 tablets (25-50 mg total) by mouth at bedtime as needed for sleep. (Patient not taking: Reported on 04/16/2018) 30 tablet 3   No facility-administered medications prior to visit.      Allergies  Allergen Reactions  . Ciprofloxacin     toxicity  . Levaquin [Levofloxacin] Anxiety    Possible CNS side effects. TOXICITY  . Moxifloxacin     TOXICITY    Social History   Tobacco Use  . Smoking status: Never Smoker  . Smokeless tobacco: Never Used  . Tobacco comment: Married, lives with spouse  Substance Use Topics  . Alcohol use: No  . Drug use: No    Family History  Problem Relation Age of Onset  . Hypertension Mother   . Diabetes Mother   . Hyperlipidemia Mother   . Breast cancer Mother 3  . Osteoarthritis Mother   . Hyperlipidemia Father   . Coronary artery disease Father        MI age 52  . Hyperlipidemia Brother     Objective:   Vitals:   04/16/18 1523  BP: (!) 148/97  Pulse: 86  Temp: 98.2 F (36.8 C)  TempSrc: Oral    Weight: 238 lb 4 oz (108.1 kg)  Height: 5\' 10"  (1.778 m)   Body mass index is 34.19 kg/m.  Physical Exam  Constitutional: She is oriented to person, place, and time. She appears well-developed and well-nourished.  Seated comfortably in chair. Her husband accompanies [place]. Very pleasant and engaged in conversation.   HENT:  Mouth/Throat: Mucous membranes are normal. No oral lesions. Normal dentition. No dental abscesses. No oropharyngeal exudate.  Primarily papular with some pustules on chin and forehead.    Cardiovascular: Normal rate.  Pulmonary/Chest: Effort normal. No respiratory distress.  Abdominal: She exhibits no distension.  Neurological: She is alert and oriented to person, place, and time.  Skin: Skin is warm and dry. No rash noted.     Psychiatric: She has a normal mood and affect. Judgment normal.          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       Relevant Medications   doxycycline (VIBRA-TABS) 100 MG tablet   Rheumatoid arthritis ([place]) - Primary    Would for now continue to hold Humira. ? If she has mild erythema nodosum on her lower legs related to agent. These lesions are not fluctuant and do not appear to have drainable quality to them. Seemingly more painful and different quality compared to other lesions. Will call to d/w dermatology team.       Folliculitis due to Pseudomonas aeruginosa    She has had good improvement with Cefepime for many of her abscess sites. Folliculitis that spans across her buttocks, gluteal clefts and posterior upper thighs are significantly improved - in general there is no texture and previous pustules are healing well.  Very rare pustules preset today and improved erythema. She does have a few areas that have erupted on on the lower right posterior flank and there are some  lesions on the lower extremities that appear a bit different than the other lesions. We discussed adding a short course of Doxycycline to cover MRSA. I would imagine there is high likelihood of polymicrobial component to her presentation. Also will have her re-do decontamination protocol with mupirocin to nares and under fingernails x 5d. Would also try 2-3x a week bleach baths to see if there is any help with regards to prevention now that her active infection appears under better control with treatment. She will send me an update via MyChart Monday - this will be her last dose of Cefepime.         Other   PICC (peripherally inserted central catheter) in place    Clean and dry. Demonstrating some contact dermatitis to the dressing material - awaiting hypoallergenic dressing supplies from [facility]. PICC can be pulled after Monday 6/3 dosing of antibiotics.           Janene Madeira, MSN, NP-C [facility] for Infectious [place] Pager: [phone] Office: [phone]  04/19/18  2:49 PM

## 2018-04-16 NOTE — Assessment & Plan Note (Signed)
Would for now continue to hold Humira. ? If she has mild erythema nodosum on her lower legs related to agent. These lesions are not fluctuant and do not appear to have drainable quality to them. Seemingly more painful and different quality compared to other lesions. Will call to d/w dermatology team.

## 2018-04-18 DIAGNOSIS — L739 Follicular disorder, unspecified: Secondary | ICD-10-CM | POA: Diagnosis not present

## 2018-04-18 DIAGNOSIS — R42 Dizziness and giddiness: Secondary | ICD-10-CM | POA: Diagnosis not present

## 2018-04-18 DIAGNOSIS — Z6834 Body mass index (BMI) 34.0-34.9, adult: Secondary | ICD-10-CM | POA: Diagnosis not present

## 2018-04-18 DIAGNOSIS — Z79899 Other long term (current) drug therapy: Secondary | ICD-10-CM | POA: Diagnosis not present

## 2018-04-18 DIAGNOSIS — E669 Obesity, unspecified: Secondary | ICD-10-CM | POA: Diagnosis not present

## 2018-04-19 DIAGNOSIS — Z452 Encounter for adjustment and management of vascular access device: Secondary | ICD-10-CM | POA: Insufficient documentation

## 2018-04-19 DIAGNOSIS — L700 Acne vulgaris: Secondary | ICD-10-CM | POA: Insufficient documentation

## 2018-04-19 NOTE — Assessment & Plan Note (Addendum)
She has had good improvement with Cefepime for many of her abscess sites. Folliculitis that spans across her buttocks, gluteal clefts and posterior upper thighs are significantly improved - in general there is no texture and previous pustules are healing well. Very rare pustules preset today and improved erythema. She does have a few areas that have erupted on on the lower right posterior flank and there are some lesions on the lower extremities that appear a bit different than the other lesions. We discussed adding a short course of Doxycycline to cover MRSA. I would imagine there is high likelihood of polymicrobial component to her presentation. Also will have her re-do decontamination protocol with mupirocin to nares and under fingernails x 5d. Would also try 2-3x a week bleach baths to see if there is any help with regards to prevention now that her active infection appears under better control with treatment. She will send me an update via MyChart Monday - this will be her last dose of Cefepime.

## 2018-04-19 NOTE — Assessment & Plan Note (Signed)
Clean and dry. Demonstrating some contact dermatitis to the dressing material - awaiting hypoallergenic dressing supplies from [facility]. PICC can be pulled after Monday 6/3 dosing of antibiotics.

## 2018-04-19 NOTE — Assessment & Plan Note (Signed)
Recommended benzyl peroxide face wash - also could discuss with dermatology team to help determine what would be best solution if this continues after D/C of antibiotic.

## 2018-04-20 DIAGNOSIS — G43719 Chronic migraine without aura, intractable, without status migrainosus: Secondary | ICD-10-CM | POA: Diagnosis not present

## 2018-04-20 DIAGNOSIS — R51 Headache: Secondary | ICD-10-CM | POA: Diagnosis not present

## 2018-04-21 DIAGNOSIS — Z6834 Body mass index (BMI) 34.0-34.9, adult: Secondary | ICD-10-CM | POA: Diagnosis not present

## 2018-04-21 DIAGNOSIS — Z79899 Other long term (current) drug therapy: Secondary | ICD-10-CM | POA: Diagnosis not present

## 2018-04-21 DIAGNOSIS — L739 Follicular disorder, unspecified: Secondary | ICD-10-CM | POA: Diagnosis not present

## 2018-04-21 DIAGNOSIS — R42 Dizziness and giddiness: Secondary | ICD-10-CM | POA: Diagnosis not present

## 2018-04-21 DIAGNOSIS — E669 Obesity, unspecified: Secondary | ICD-10-CM | POA: Diagnosis not present

## 2018-04-27 MED FILL — NUVARING VAGINAL RING: 0.12-0.015 | 84 days supply | Qty: 3 | Fill #4

## 2018-04-27 MED FILL — HUMIRA PEN 40 MG/0.4ML PNKT: 40 | 28 days supply | Qty: 2 | Fill #1

## 2018-04-29 MED ORDER — DEXTROSE 5 % IV SOLN: 2.0000 g | Freq: Once | INTRAVENOUS | Status: DC

## 2018-04-30 MED FILL — MONTELUKAST SOD 10 MG TAB: 10 | 90 days supply | Qty: 90 | Fill #3

## 2018-04-30 MED FILL — LEFLUNOMIDE 20 MG TABLET: 20 | 30 days supply | Qty: 30 | Fill #1

## 2018-05-08 ENCOUNTER — Encounter (INDEPENDENT_AMBULATORY_CARE_PROVIDER_SITE_OTHER): Payer: Self-pay

## 2018-05-12 DIAGNOSIS — L089 Local infection of the skin and subcutaneous tissue, unspecified: Secondary | ICD-10-CM | POA: Diagnosis not present

## 2018-05-12 DIAGNOSIS — L304 Erythema intertrigo: Secondary | ICD-10-CM | POA: Diagnosis not present

## 2018-05-12 MED FILL — CLINDAMYCIN PH 1% GEL: 1 | 14 days supply | Qty: 60 | Fill #0

## 2018-05-12 MED FILL — DOXYCYCLINE HYCLATE 100 MG: 100 | 30 days supply | Qty: 60 | Fill #0

## 2018-05-13 ENCOUNTER — Encounter: Payer: Self-pay | Admitting: Infectious Diseases

## 2018-05-14 ENCOUNTER — Ambulatory Visit: Payer: 59 | Admitting: Family Medicine

## 2018-05-20 ENCOUNTER — Encounter: Payer: Self-pay | Admitting: Internal Medicine

## 2018-05-20 MED FILL — PREVIDENT 5000 BOOSTER PLUS: 1.1 | 30 days supply | Qty: 100 | Fill #0

## 2018-05-22 ENCOUNTER — Ambulatory Visit (INDEPENDENT_AMBULATORY_CARE_PROVIDER_SITE_OTHER): Payer: 59 | Admitting: Internal Medicine

## 2018-05-22 ENCOUNTER — Encounter: Payer: Self-pay | Admitting: Internal Medicine

## 2018-05-22 ENCOUNTER — Other Ambulatory Visit (INDEPENDENT_AMBULATORY_CARE_PROVIDER_SITE_OTHER): Payer: 59

## 2018-05-22 VITALS — BP 118/82 | HR 86 | Temp 97.9°F | Ht 70.0 in | Wt 236.0 lb

## 2018-05-22 DIAGNOSIS — Z0000 Encounter for general adult medical examination without abnormal findings: Secondary | ICD-10-CM

## 2018-05-22 DIAGNOSIS — R739 Hyperglycemia, unspecified: Secondary | ICD-10-CM | POA: Diagnosis not present

## 2018-05-22 DIAGNOSIS — Z23 Encounter for immunization: Secondary | ICD-10-CM

## 2018-05-22 LAB — URINALYSIS, ROUTINE W REFLEX MICROSCOPIC
Bilirubin Urine: NEGATIVE
Hgb urine dipstick: NEGATIVE
KETONES UR: NEGATIVE
LEUKOCYTES UA: NEGATIVE
Nitrite: NEGATIVE
RBC / HPF: NONE SEEN (ref 0–?)
SPECIFIC GRAVITY, URINE: 1.025 (ref 1.000–1.030)
URINE GLUCOSE: NEGATIVE
UROBILINOGEN UA: 0.2 (ref 0.0–1.0)
pH: 6 (ref 5.0–8.0)

## 2018-05-22 LAB — CBC WITH DIFFERENTIAL/PLATELET
Basophils Absolute: 0.1 10*3/uL (ref 0.0–0.1)
Basophils Relative: 1.1 % (ref 0.0–3.0)
EOS PCT: 7.3 % — AB (ref 0.0–5.0)
Eosinophils Absolute: 0.5 10*3/uL (ref 0.0–0.7)
HCT: 39.1 % (ref 36.0–46.0)
Hemoglobin: 13.1 g/dL (ref 12.0–15.0)
LYMPHS ABS: 1.9 10*3/uL (ref 0.7–4.0)
Lymphocytes Relative: 29.1 % (ref 12.0–46.0)
MCHC: 33.6 g/dL (ref 30.0–36.0)
MCV: 82 fl (ref 78.0–100.0)
MONO ABS: 0.6 10*3/uL (ref 0.1–1.0)
MONOS PCT: 9 % (ref 3.0–12.0)
NEUTROS ABS: 3.5 10*3/uL (ref 1.4–7.7)
NEUTROS PCT: 53.5 % (ref 43.0–77.0)
PLATELETS: 358 10*3/uL (ref 150.0–400.0)
RBC: 4.77 Mil/uL (ref 3.87–5.11)
RDW: 14 % (ref 11.5–15.5)
WBC: 6.6 10*3/uL (ref 4.0–10.5)

## 2018-05-22 LAB — BASIC METABOLIC PANEL
BUN: 10 mg/dL (ref 6–23)
CALCIUM: 9.1 mg/dL (ref 8.4–10.5)
CHLORIDE: 106 meq/L (ref 96–112)
CO2: 25 meq/L (ref 19–32)
Creatinine, Ser: 0.65 mg/dL (ref 0.40–1.20)
GFR: 106.53 mL/min (ref >60.00)
Glucose, Bld: 87 mg/dL (ref 70–99)
POTASSIUM: 4 meq/L (ref 3.5–5.1)
SODIUM: 140 meq/L (ref 135–145)

## 2018-05-22 LAB — LIPID PANEL
CHOL/HDL RATIO: 3
Cholesterol: 202 mg/dL — ABNORMAL HIGH (ref 0–200)
HDL: 64.8 mg/dL (ref >39.00)
LDL CALC: 110 mg/dL — AB (ref 0–99)
NONHDL: 137.56
TRIGLYCERIDES: 136 mg/dL (ref 0.0–149.0)
VLDL: 27.2 mg/dL (ref 0.0–40.0)

## 2018-05-22 LAB — HEPATIC FUNCTION PANEL
ALT: 19 U/L (ref 0–35)
AST: 17 U/L (ref 0–37)
Albumin: 3.7 g/dL (ref 3.5–5.2)
Alkaline Phosphatase: 63 U/L (ref 39–117)
BILIRUBIN TOTAL: 0.3 mg/dL (ref 0.2–1.2)
Bilirubin, Direct: 0.1 mg/dL (ref 0.0–0.3)
Total Protein: 7.5 g/dL (ref 6.0–8.3)

## 2018-05-22 LAB — TSH: TSH: 0.83 u[IU]/mL (ref 0.35–4.50)

## 2018-05-22 LAB — HEMOGLOBIN A1C: Hgb A1c MFr Bld: 5.6 % (ref 4.6–6.5)

## 2018-05-22 NOTE — Assessment & Plan Note (Signed)

## 2018-05-22 NOTE — Progress Notes (Signed)
Subjective:    [name], female    DOB: 1976-02-25, 42 y.o.   MRN: [id number]  HPI  Here for wellness and f/u;  Overall doing ok;  Pt denies Chest pain, worsening SOB, DOE, wheezing, orthopnea, PND, worsening LE edema, palpitations, dizziness or syncope.  Pt denies neurological change such as new headache, facial or extremity weakness.  Pt denies polydipsia, polyuria, or low sugar symptoms. Pt states overall good compliance with treatment and medications, good tolerability, and has been trying to follow appropriate diet.  Pt denies worsening depressive symptoms, suicidal ideation or panic. No fever, night sweats, wt loss, loss of appetite, or other constitutional symptoms.  Pt states good ability with ADL's, has low fall risk, home safety reviewed and adequate, no other significant changes in hearing or vision, and only occasionally active with exercise. Wt Readings from Last 3 Encounters:  05/22/18 236 lb (107 kg)  04/16/18 238 lb 4 oz (108.1 kg)  03/24/18 241 lb (109.3 kg)  Carries recent dx RA, States humira seems to be working for her, has an unuaul presentation mostly back pain and Sternoclavicular and bilat shoulder pain, now all much improved.  Course complicated by abd wall abscess now resolved after 2 wks IV antibx Past Medical History:  Diagnosis Date  . ALLERGIC RHINITIS   . ANXIETY DEPRESSION   . CONSTIPATION   . DEPRESSION   . Esophageal reflux   . HYPERCHOLESTEROLEMIA   . OBESITY   . Occipital neuralgia   . Palpitations   . Rheumatoid arthritis ([place]) 12/24/2017   Past Surgical History:  Procedure Laterality Date  . NO PAST SURGERIES      reports that she has never smoked. She has never used smokeless tobacco. She reports that she does not drink alcohol or use drugs. family history includes Breast cancer (age of onset: 34) in her mother; Coronary artery disease in her father; Diabetes in her mother; Hyperlipidemia in her brother, father, and mother;  Hypertension in her mother; Osteoarthritis in her mother. Allergies  Allergen Reactions  . Ciprofloxacin     toxicity  . Levaquin [Levofloxacin] Anxiety    Possible CNS side effects. TOXICITY  . Moxifloxacin     TOXICITY   Current Outpatient Medications on File Prior to Visit  Medication Sig Dispense Refill  . Adalimumab (HUMIRA PEN) 40 MG/0.4ML PNKT Inject 1 pen into the skin every 14 (fourteen) days. 2 each 2  . ascorbic acid (VITAMIN C) 1000 MG tablet Take 1,000 mg by mouth daily.    . busPIRone (BUSPAR) 15 MG tablet 1  bid 60 tablet 5  . cetirizine (ZYRTEC) 10 MG tablet Take 1 tablet (10 mg total) by mouth daily. 30 tablet 3  . cholecalciferol (VITAMIN D) 1000 units tablet Take 5,000 Units by mouth daily.    . clonazePAM (KLONOPIN) 1 MG tablet Take 1 mg by mouth daily as needed for anxiety (take half a tablet as needed).    . colchicine 0.6 MG tablet Take 1 tablet (0.6 mg total) by mouth 2 (two) times daily. 60 tablet 2  . cyclobenzaprine (FLEXERIL) 10 MG tablet Take 1 tablet (10 mg total) by mouth 3 (three) times daily as needed for muscle spasms. 30 tablet 0  . escitalopram (LEXAPRO) 20 MG tablet Take 1 tablet (20 mg total) by mouth daily. 30 tablet 6  . esomeprazole (NEXIUM) 40 MG capsule Take 1 capsule (40 mg total) by mouth daily. 90 capsule 3  . etonogestrel-ethinyl estradiol (NUVARING) 0.12-0.015 MG/24HR  vaginal ring Continuous use.  Remove and insert new ring every 4 weeks. 3 each 4  . gabapentin (NEURONTIN) 300 MG capsule Take 300 mg by mouth 3 (three) times daily.    Marland Kitchen gentamicin cream (GARAMYCIN) 0.1 % Apply 1 application topically 3 (three) times daily. 30 g 2  . Ibuprofen-Famotidine (DUEXIS) 800-26.6 MG TABS Take 800 mg by mouth 3 (three) times daily. 90 tablet 3  . leflunomide (ARAVA) 20 MG tablet Take 20 mg by mouth daily.    . meclizine (ANTIVERT) 25 MG tablet Take 1 tablet (25 mg total) by mouth 3 (three) times daily as needed for dizziness. Every 4-6 hours as needed.  30 tablet 0  . montelukast (SINGULAIR) 10 MG tablet Take 1 tablet (10 mg total) by mouth at bedtime. 90 tablet 3  . Multiple Vitamin (MULTIVITAMIN) tablet Take 1 tablet by mouth daily.      . nitroGLYCERIN (NITRODUR - DOSED IN MG/24 HR) 0.2 mg/hr patch 1/4 patch daily 30 patch 1  . Omega-3 Fatty Acids (FISH OIL) 1000 MG CAPS Take 2,000 mg by mouth 2 (two) times daily.     . Probiotic Product (SOLUBLE FIBER/PROBIOTICS PO) Take 2 capsules by mouth 2 (two) times daily.    . traZODone (DESYREL) 50 MG tablet Take 0.5-1 tablets (25-50 mg total) by mouth at bedtime as needed for sleep. 30 tablet 3  . zolpidem (AMBIEN) 5 MG tablet Take 1 tablet (5 mg total) by mouth at bedtime as needed for sleep. 30 tablet 2   Current Facility-Administered Medications on File Prior to Visit  Medication Dose Route Frequency Provider Last Rate Last Dose  . ceFEPIme (MAXIPIME) 2 g in dextrose 5 % 50 mL IVPB  2 g Intravenous Once [place] Callas, NP       Review of Systems Constitutional: Negative for other unusual diaphoresis, sweats, appetite or weight changes HENT: Negative for other worsening hearing loss, ear pain, facial swelling, mouth sores or neck stiffness.   Eyes: Negative for other worsening pain, redness or other visual disturbance.  Respiratory: Negative for other stridor or swelling Cardiovascular: Negative for other palpitations or other chest pain  Gastrointestinal: Negative for worsening diarrhea or loose stools, blood in stool, distention or other pain Genitourinary: Negative for hematuria, flank pain or other change in urine volume.  Musculoskeletal: Negative for myalgias or other joint swelling.  Skin: Negative for other color change, or other wound or worsening drainage.  Neurological: Negative for other syncope or numbness. Hematological: Negative for other adenopathy or swelling Psychiatric/Behavioral: Negative for hallucinations, other worsening agitation, SI, self-injury, or new decreased  concentration ALl other system neg per pt    Objective:   Physical Exam BP 118/82   Pulse 86   Temp 97.9 F (36.6 C) (Oral)   Ht 5\' 10"  (1.778 m)   Wt 236 lb (107 kg)   SpO2 97%   BMI 33.86 kg/m  VS noted,  Constitutional: Pt is oriented to person, place, and time. Appears well-developed and well-nourished, in no significant distress and comfortable Head: Normocephalic and atraumatic  Eyes: Conjunctivae and EOM are normal. Pupils are equal, round, and reactive to light Right Ear: External ear normal without discharge Left Ear: External ear normal without discharge Nose: Nose without discharge or deformity Mouth/Throat: Oropharynx is without other ulcerations and moist  Neck: Normal range of motion. Neck supple. No JVD present. No tracheal deviation present or significant neck LA or mass Cardiovascular: Normal rate, regular rhythm, normal heart sounds and intact distal  pulses. Pulmonary/Chest: WOB normal and breath sounds without rales or wheezing  Abdominal: Soft. Bowel sounds are normal. NT. No HSM  Musculoskeletal: Normal range of motion. Exhibits no edema Lymphadenopathy: Has no other cervical adenopathy.  Neurological: Pt is alert and oriented to person, place, and time. Pt has normal reflexes. No cranial nerve deficit. Motor grossly intact, Gait intact Skin: Skin is warm and dry. No rash noted or new ulcerations Psychiatric:  Has normal mood and affect. Behavior is normal without agitation No other exam findings Lab Results  Component Value Date   WBC 6.8 05/06/2017   HGB 13.1 05/06/2017   HCT 39.6 05/06/2017   PLT 339.0 05/06/2017   GLUCOSE 107 (H) 05/06/2017   CHOL 197 05/06/2017   TRIG 208.0 (H) 05/06/2017   HDL 48.20 05/06/2017   LDLDIRECT 114.0 05/06/2017   LDLCALC 69 05/03/2016   ALT 13 05/06/2017   AST 12 05/06/2017   NA 140 05/06/2017   K 4.3 05/06/2017   CL 106 05/06/2017   CREATININE 0.69 05/06/2017   BUN 8 05/06/2017   CO2 27 05/06/2017   TSH 1.45  05/06/2017   HGBA1C 5.8 05/06/2017       Assessment & Plan:

## 2018-05-22 NOTE — Patient Instructions (Addendum)
You had the Tdap tetanus shot today  Please continue all other medications as before, and refills have been done if requested.  Please have the pharmacy call with any other refills you may need.  Please continue your efforts at being more active, low cholesterol diet, and weight control.  You are otherwise up to date with prevention measures today.  Please keep your appointments with your specialists as you may have planned Please go to the LAB in the Basement (turn left off the elevator) for the tests to be done today  You will be contacted by phone if any changes need to be made immediately.  Otherwise, you will receive a letter about your results with an explanation, but please check with MyChart first.  Please remember to sign up for MyChart if you have not done so, as this will be important to you in the future with finding out test results, communicating by private email, and scheduling acute appointments online when needed.  Please return in 1 year for your yearly visit, or sooner if needed, with Lab testing done 3-5 days before  

## 2018-05-22 NOTE — Assessment & Plan Note (Signed)
stable overall by history and exam, recent data reviewed with pt, and pt to continue medical treatment as before,  to f/u any worsening symptoms or concerns Lab Results  Component Value Date   HGBA1C 5.8 05/06/2017

## 2018-05-25 MED FILL — HUMIRA PEN 40 MG/0.4ML PNKT: 40 | 28 days supply | Qty: 2 | Fill #2

## 2018-05-27 ENCOUNTER — Encounter: Payer: Self-pay | Admitting: Internal Medicine

## 2018-05-27 ENCOUNTER — Other Ambulatory Visit: Payer: Self-pay | Admitting: Internal Medicine

## 2018-05-27 DIAGNOSIS — M0589 Other rheumatoid arthritis with rheumatoid factor of multiple sites: Secondary | ICD-10-CM | POA: Diagnosis not present

## 2018-05-27 DIAGNOSIS — E669 Obesity, unspecified: Secondary | ICD-10-CM | POA: Diagnosis not present

## 2018-05-27 DIAGNOSIS — Z8709 Personal history of other diseases of the respiratory system: Secondary | ICD-10-CM | POA: Diagnosis not present

## 2018-05-27 DIAGNOSIS — Z6834 Body mass index (BMI) 34.0-34.9, adult: Secondary | ICD-10-CM | POA: Diagnosis not present

## 2018-05-27 DIAGNOSIS — L0291 Cutaneous abscess, unspecified: Secondary | ICD-10-CM | POA: Diagnosis not present

## 2018-05-27 DIAGNOSIS — M2550 Pain in unspecified joint: Secondary | ICD-10-CM | POA: Diagnosis not present

## 2018-05-27 MED FILL — ESCITALOPRAM 20 MG TABLET: 20 | 30 days supply | Qty: 30 | Fill #2

## 2018-05-27 MED FILL — LEFLUNOMIDE 20 MG TABLET: 20 | 30 days supply | Qty: 30 | Fill #2

## 2018-05-27 MED FILL — ESOMEPRAZOLE MAG DR 40 MG C: 40 | 90 days supply | Qty: 90 | Fill #0

## 2018-06-05 NOTE — Progress Notes (Signed)
[name] [address] [place], [place] [address] Phone: [phone] Subjective:     CC: Back pain follow-up  NOB:[id number]  April [name] is a 42 y.o. female coming in with complaint of back pain. She is here for OMT. She is feeling well today. No change in her pain since last visit. Still does experiencing pain in the right scapula with fatigue.  Overall doing relatively well.  Feels like her rheumatoid arthritis is in good control as well.  Discussed icing regimen and home exercises.  Has been doing them occasionally    Past Medical History:  Diagnosis Date  . ALLERGIC RHINITIS   . ANXIETY DEPRESSION   . CONSTIPATION   . DEPRESSION   . Esophageal reflux   . HYPERCHOLESTEROLEMIA   . OBESITY   . Occipital neuralgia   . Palpitations   . Rheumatoid arthritis ([place]) 12/24/2017   Past Surgical History:  Procedure Laterality Date  . NO PAST SURGERIES     Social History   Socioeconomic History  . Marital status: Married    Spouse name: Not on file  . Number of children: Not on file  . Years of education: Not on file  . Highest education level: Not on file  Occupational History  . Not on file  Social Needs  . Financial resource strain: Not on file  . Food insecurity:    Worry: Not on file    Inability: Not on file  . Transportation needs:    Medical: Not on file    Non-medical: Not on file  Tobacco Use  . Smoking status: Never Smoker  . Smokeless tobacco: Never Used  . Tobacco comment: Married, lives with spouse  Substance and Sexual Activity  . Alcohol use: No  . Drug use: No  . Sexual activity: Yes    Partners: Male    Birth control/protection: Inserts    Comment: 1ST INTERCOURSE- 21, PARTNERS- 4   Lifestyle  . Physical activity:    Days per week: Not on file    Minutes per session: Not on file  . Stress: Not on file  Relationships  . Social connections:    Talks on phone: Not on file    Gets together: Not on file   Attends religious service: Not on file    Active member of club or organization: Not on file    Attends meetings of clubs or organizations: Not on file    Relationship status: Not on file  Other Topics Concern  . Not on file  Social History Narrative  . Not on file   Allergies  Allergen Reactions  . Ciprofloxacin     toxicity  . Levaquin [Levofloxacin] Anxiety    Possible CNS side effects. TOXICITY  . Moxifloxacin     TOXICITY   Family History  Problem Relation Age of Onset  . Hypertension Mother   . Diabetes Mother   . Hyperlipidemia Mother   . Breast cancer Mother 22  . Osteoarthritis Mother   . Hyperlipidemia Father   . Coronary artery disease Father        MI age 35  . Hyperlipidemia Brother      Past medical history, social, surgical and family history all reviewed in electronic medical record.  No pertanent information unless stated regarding to the chief complaint.   Review of Systems:Review of systems updated and as accurate as of 06/05/18  No headache, visual changes, nausea, vomiting, diarrhea, constipation, dizziness, abdominal pain, skin  rash, fevers, chills, night sweats, weight loss, swollen lymph nodes, body aches, joint swelling, muscle aches, chest pain, shortness of breath, mood changes.   Objective  There were no vitals taken for this visit. Systems examined below as of 06/05/18   General: No apparent distress alert and oriented x3 mood and affect normal, dressed appropriately.  HEENT: Pupils equal, extraocular movements intact  Respiratory: Patient's speak in full sentences and does not appear short of breath  Cardiovascular: No lower extremity edema, non tender, no erythema  Skin: Warm dry intact with no signs of infection or rash on extremities or on axial skeleton.  Abdomen: Soft nontender  Neuro: Cranial nerves II through XII are intact, neurovascularly intact in all extremities with 2+ DTRs and 2+ pulses.  Lymph: No lymphadenopathy of posterior  or anterior cervical chain or axillae bilaterally.  Gait normal with good balance and coordination.  MSK:  Non tender with full range of motion and good stability and symmetric strength and tone of shoulders, elbows, wrist, hip, knee and ankles bilaterally.  Back Exam:  Inspection: Mild loss of lordosis Motion: Flexion 35 deg, Extension 25 deg, Side Bending to 35 deg bilaterally,  Rotation to 35 deg bilaterally  SLR laying: Negative  XSLR laying: Negative  Palpable tenderness: Tender to palpation the paraspinal musculature lumbar spine left greater than right.  Mild pain in the right scapular region as well FABER: negative. Sensory change: Gross sensation intact to all lumbar and sacral dermatomes.  Reflexes: 2+ at both patellar tendons, 2+ at achilles tendons, Babinski's downgoing.  Strength at foot  Plantar-flexion: 5/5 Dorsi-flexion: 5/5 Eversion: 5/5 Inversion: 5/5  Leg strength  Quad: 5/5 Hamstring: 5/5 Hip flexor: 5/5 Hip abductors: 5/5  Gait unremarkable.  Osteopathic findings C2 flexed rotated and side bent right T3 extended rotated and side bent right inhaled third rib T6 extended rotated and side bent left L1 flexed rotated and side bent right Sacrum right on right    Impression and Recommendations:     This case required medical decision making of moderate complexity.      Note: This dictation was prepared with Dragon dictation along with smaller phrase technology. Any transcriptional errors that result from this process are unintentional.

## 2018-06-08 ENCOUNTER — Encounter: Payer: Self-pay | Admitting: Family Medicine

## 2018-06-08 ENCOUNTER — Ambulatory Visit (INDEPENDENT_AMBULATORY_CARE_PROVIDER_SITE_OTHER): Payer: 59 | Admitting: Family Medicine

## 2018-06-08 VITALS — BP 118/86 | HR 83 | Ht 70.0 in | Wt 239.0 lb

## 2018-06-08 DIAGNOSIS — M940 Chondrocostal junction syndrome [Tietze]: Secondary | ICD-10-CM | POA: Diagnosis not present

## 2018-06-08 DIAGNOSIS — M999 Biomechanical lesion, unspecified: Secondary | ICD-10-CM | POA: Insufficient documentation

## 2018-06-08 NOTE — Patient Instructions (Signed)
3 months you all star!

## 2018-06-08 NOTE — Assessment & Plan Note (Signed)
Stable.  Patient has been responding fairly well overall.  We discussed icing regimen and home exercises.  We discussed with activities of doing which wants to avoid.  Discussed with patient about icing regimen and home exercises.  Discussed posture and ergonomics.  Patient will follow-up with me again in 3 months

## 2018-06-08 NOTE — Assessment & Plan Note (Signed)
Decision today to treat with OMT was based on Physical Exam  After verbal consent patient was treated with HVLA, ME, FPR techniques in cervical, thoracic, rib, lumbar and sacral areas  Patient tolerated the procedure well with improvement in symptoms  Patient given exercises, stretches and lifestyle modifications  See medications in patient instructions if given  Patient will follow up in 12 weeks 

## 2018-06-22 ENCOUNTER — Other Ambulatory Visit: Payer: Self-pay | Admitting: Pharmacist

## 2018-06-22 MED ORDER — ADALIMUMAB 40 MG/0.4ML ~~LOC~~ AJKT: 1.0000 "pen " | AUTO-INJECTOR | SUBCUTANEOUS | 2 refills | Status: DC

## 2018-06-22 MED FILL — HUMIRA PEN 40 MG/0.4ML PNKT: 40 | 28 days supply | Qty: 2 | Fill #0

## 2018-06-24 ENCOUNTER — Encounter: Payer: 59 | Admitting: Obstetrics & Gynecology

## 2018-06-25 ENCOUNTER — Encounter: Payer: 59 | Admitting: Obstetrics & Gynecology

## 2018-07-05 NOTE — Progress Notes (Signed)
Subjective:  [name], female    DOB: Apr 22, 1976,  MRN: [id number]  Chief Complaint  Patient presents with  . Plantar Fasciitis    Right, 3 wk f/u - pt stated doing much better, only has pain when walking barefoot     42 y.o. female presents with the above complaint.  Doing well. Past Medical History:  Diagnosis Date  . ALLERGIC RHINITIS   . ANXIETY DEPRESSION   . CONSTIPATION   . DEPRESSION   . Esophageal reflux   . HYPERCHOLESTEROLEMIA   . OBESITY   . Occipital neuralgia   . Palpitations   . Rheumatoid arthritis ([place]) 12/24/2017   Past Surgical History:  Procedure Laterality Date  . NO PAST SURGERIES      Current Outpatient Medications:  .  Adalimumab (HUMIRA PEN) 40 MG/0.4ML PNKT, Inject 1 pen into the skin every 14 (fourteen) days. Inject as directed under the skin every other week, Disp: 2 each, Rfl: 2 .  ascorbic acid (VITAMIN C) 1000 MG tablet, Take 1,000 mg by mouth daily., Disp: , Rfl:  .  busPIRone (BUSPAR) 15 MG tablet, 1  bid, Disp: 60 tablet, Rfl: 5 .  cetirizine (ZYRTEC) 10 MG tablet, Take 1 tablet (10 mg total) by mouth daily., Disp: 30 tablet, Rfl: 3 .  cholecalciferol (VITAMIN D) 1000 units tablet, Take 5,000 Units by mouth daily., Disp: , Rfl:  .  clonazePAM (KLONOPIN) 1 MG tablet, Take 1 mg by mouth daily as needed for anxiety (take half a tablet as needed)., Disp: , Rfl:  .  colchicine 0.6 MG tablet, Take 1 tablet (0.6 mg total) by mouth 2 (two) times daily., Disp: 60 tablet, Rfl: 2 .  cyclobenzaprine (FLEXERIL) 10 MG tablet, Take 1 tablet (10 mg total) by mouth 3 (three) times daily as needed for muscle spasms., Disp: 30 tablet, Rfl: 0 .  escitalopram (LEXAPRO) 20 MG tablet, Take 1 tablet (20 mg total) by mouth daily., Disp: 30 tablet, Rfl: 6 .  esomeprazole (NEXIUM) 40 MG capsule, TAKE 1 CAPSULE BY MOUTH DAILY., Disp: 90 capsule, Rfl: 3 .  etonogestrel-ethinyl estradiol (NUVARING) 0.12-0.015 MG/24HR vaginal ring, Continuous use.   Remove and insert new ring every 4 weeks., Disp: 3 each, Rfl: 4 .  gabapentin (NEURONTIN) 300 MG capsule, Take 300 mg by mouth 3 (three) times daily., Disp: , Rfl:  .  gentamicin cream (GARAMYCIN) 0.1 %, Apply 1 application topically 3 (three) times daily., Disp: 30 g, Rfl: 2 .  Ibuprofen-Famotidine (DUEXIS) 800-26.6 MG TABS, Take 800 mg by mouth 3 (three) times daily., Disp: 90 tablet, Rfl: 3 .  leflunomide (ARAVA) 20 MG tablet, Take 20 mg by mouth daily., Disp: , Rfl:  .  meclizine (ANTIVERT) 25 MG tablet, Take 1 tablet (25 mg total) by mouth 3 (three) times daily as needed for dizziness. Every 4-6 hours as needed., Disp: 30 tablet, Rfl: 0 .  montelukast (SINGULAIR) 10 MG tablet, Take 1 tablet (10 mg total) by mouth at bedtime., Disp: 90 tablet, Rfl: 3 .  Multiple Vitamin (MULTIVITAMIN) tablet, Take 1 tablet by mouth daily.  , Disp: , Rfl:  .  nitroGLYCERIN (NITRODUR - DOSED IN MG/24 HR) 0.2 mg/hr patch, 1/4 patch daily, Disp: 30 patch, Rfl: 1 .  Omega-3 Fatty Acids (FISH OIL) 1000 MG CAPS, Take 2,000 mg by mouth 2 (two) times daily. , Disp: , Rfl:  .  Probiotic Product (SOLUBLE FIBER/PROBIOTICS PO), Take 2 capsules by mouth 2 (two) times daily., Disp: , Rfl:  .  traZODone (DESYREL) 50 MG tablet, Take 0.5-1 tablets (25-50 mg total) by mouth at bedtime as needed for sleep., Disp: 30 tablet, Rfl: 3 .  zolpidem (AMBIEN) 5 MG tablet, Take 1 tablet (5 mg total) by mouth at bedtime as needed for sleep., Disp: 30 tablet, Rfl: 2  Current Facility-Administered Medications:  .  ceFEPIme (MAXIPIME) 2 g in dextrose 5 % 50 mL IVPB, 2 g, Intravenous, Once, Dixon, Melton Krebs, NP  Allergies  Allergen Reactions  . Ciprofloxacin     toxicity  . Levaquin [Levofloxacin] Anxiety    Possible CNS side effects. TOXICITY  . Moxifloxacin     TOXICITY   Review of Systems Objective:   There were no vitals filed for this visit. General AA&O x3. Normal mood and affect.  Vascular Dorsalis pedis and posterior  tibial pulses  present 2+ bilaterally  Capillary refill normal to all digits. Pedal hair growth normal.  Neurologic Epicritic sensation grossly present.  Dermatologic No open lesions. Interspaces clear of maceration. Nails well groomed and normal in appearance.  Orthopedic: MMT 5/5 in dorsiflexion, plantarflexion, inversion, and eversion. Normal joint ROM without pain or crepitus. Palpation right medial calcaneal tuber  Radio graphs taken and reviewed no acute fractures dislocations  Assessment & Plan:  Patient was evaluated and treated and all questions answered.  Plantar Fasciitis, right - Improving. - No injection today. - Continue stretching and icing.  Return in about 4 weeks (around 12/18/2017), or if symptoms worsen or fail to improve.

## 2018-07-08 MED FILL — ESCITALOPRAM 20 MG TABLET: 20 | 30 days supply | Qty: 30 | Fill #3

## 2018-07-08 MED FILL — LEFLUNOMIDE 20 MG TABLET: 20 | 30 days supply | Qty: 30 | Fill #3

## 2018-07-27 ENCOUNTER — Other Ambulatory Visit: Payer: Self-pay | Admitting: Obstetrics & Gynecology

## 2018-07-27 MED FILL — NUVARING VAGINAL RING: 0.12-0.015 | 84 days supply | Qty: 3 | Fill #0

## 2018-07-27 MED FILL — HUMIRA PEN 40 MG/0.4ML PNKT: 40 | 28 days supply | Qty: 2 | Fill #1

## 2018-07-27 NOTE — Telephone Encounter (Signed)
CE scheduled 10/14/18.

## 2018-08-03 ENCOUNTER — Other Ambulatory Visit: Payer: Self-pay | Admitting: Internal Medicine

## 2018-08-03 MED FILL — MONTELUKAST SOD 10 MG TAB: 10 | 90 days supply | Qty: 90 | Fill #0

## 2018-08-03 MED FILL — LEFLUNOMIDE 20 MG TABLET: 20 | 30 days supply | Qty: 30 | Fill #0

## 2018-08-03 MED FILL — ESCITALOPRAM 20 MG TABLET: 20 | 30 days supply | Qty: 30 | Fill #4

## 2018-08-10 DIAGNOSIS — H18821 Corneal disorder due to contact lens, right eye: Secondary | ICD-10-CM | POA: Diagnosis not present

## 2018-08-10 MED FILL — LOTEMAX SM 0.38 % GEL: 0.38 | 30 days supply | Qty: 5 | Fill #0

## 2018-08-27 ENCOUNTER — Other Ambulatory Visit: Payer: Self-pay | Admitting: Pharmacist

## 2018-08-27 DIAGNOSIS — M0589 Other rheumatoid arthritis with rheumatoid factor of multiple sites: Secondary | ICD-10-CM | POA: Diagnosis not present

## 2018-08-27 DIAGNOSIS — R21 Rash and other nonspecific skin eruption: Secondary | ICD-10-CM | POA: Diagnosis not present

## 2018-08-27 DIAGNOSIS — Z6836 Body mass index (BMI) 36.0-36.9, adult: Secondary | ICD-10-CM | POA: Diagnosis not present

## 2018-08-27 DIAGNOSIS — E669 Obesity, unspecified: Secondary | ICD-10-CM | POA: Diagnosis not present

## 2018-08-27 DIAGNOSIS — M2550 Pain in unspecified joint: Secondary | ICD-10-CM | POA: Diagnosis not present

## 2018-08-27 DIAGNOSIS — L0291 Cutaneous abscess, unspecified: Secondary | ICD-10-CM | POA: Diagnosis not present

## 2018-08-27 DIAGNOSIS — Z8709 Personal history of other diseases of the respiratory system: Secondary | ICD-10-CM | POA: Diagnosis not present

## 2018-08-27 MED ORDER — ETANERCEPT 50 MG/ML ~~LOC~~ SOCT: 1.0000 mL | SUBCUTANEOUS | 5 refills | Status: DC

## 2018-08-27 MED FILL — CLOBETASOL PROPIONATE 0.05: 0.05 | 10 days supply | Qty: 15 | Fill #0

## 2018-08-31 ENCOUNTER — Telehealth: Payer: Self-pay | Admitting: Pharmacist

## 2018-08-31 MED FILL — LEFLUNOMIDE 20 MG TABLET: 20 | 30 days supply | Qty: 30 | Fill #1

## 2018-08-31 MED FILL — ESOMEPRAZOLE MAG DR 40 MG C: 40 | 90 days supply | Qty: 90 | Fill #1

## 2018-08-31 MED FILL — ESCITALOPRAM 20 MG TABLET: 20 | 30 days supply | Qty: 30 | Fill #5

## 2018-08-31 NOTE — Telephone Encounter (Signed)
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 Reviewed Enbrel with her, including possible adverse effects. No recommendations for any changes.

## 2018-09-07 NOTE — Progress Notes (Signed)
[name] [address], [place] [address] Phone: [phone] Subjective:     CC: Neck and back pain follow-up  TMH:[id number]  April [name] is a 42 y.o. female coming in with complaint of neck and back pain.  Patient has had significant difficulty recently.  Increasing discomfort.  Does have autoimmune disease that is switching medication from Humira to Enbrel.  Supposed to start next week but has been off of it for some time and is feeling that she is having increasing joint pain.  Notices some swelling of her hands recently as well.  Increasing in headaches as well.     Past Medical History:  Diagnosis Date  . ALLERGIC RHINITIS   . ANXIETY DEPRESSION   . CONSTIPATION   . DEPRESSION   . Esophageal reflux   . HYPERCHOLESTEROLEMIA   . OBESITY   . Occipital neuralgia   . Palpitations   . Rheumatoid arthritis ([place]) 12/24/2017   Past Surgical History:  Procedure Laterality Date  . NO PAST SURGERIES     Social History   Socioeconomic History  . Marital status: Married    Spouse name: Not on file  . Number of children: Not on file  . Years of education: Not on file  . Highest education level: Not on file  Occupational History  . Not on file  Social Needs  . Financial resource strain: Not on file  . Food insecurity:    Worry: Not on file    Inability: Not on file  . Transportation needs:    Medical: Not on file    Non-medical: Not on file  Tobacco Use  . Smoking status: Never Smoker  . Smokeless tobacco: Never Used  . Tobacco comment: Married, lives with spouse  Substance and Sexual Activity  . Alcohol use: No  . Drug use: No  . Sexual activity: Yes    Partners: Male    Birth control/protection: Inserts    Comment: 1ST INTERCOURSE- 21, PARTNERS- 4   Lifestyle  . Physical activity:    Days per week: Not on file    Minutes per session: Not on file  . Stress: Not on file  Relationships  . Social connections:   Talks on phone: Not on file    Gets together: Not on file    Attends religious service: Not on file    Active member of club or organization: Not on file    Attends meetings of clubs or organizations: Not on file    Relationship status: Not on file  Other Topics Concern  . Not on file  Social History Narrative  . Not on file   Allergies  Allergen Reactions  . Ciprofloxacin     toxicity  . Levaquin [Levofloxacin] Anxiety    Possible CNS side effects. TOXICITY  . Moxifloxacin     TOXICITY   Family History  Problem Relation Age of Onset  . Hypertension Mother   . Diabetes Mother   . Hyperlipidemia Mother   . Breast cancer Mother 29  . Osteoarthritis Mother   . Hyperlipidemia Father   . Coronary artery disease Father        MI age 37  . Hyperlipidemia Brother     Current Outpatient Medications (Endocrine & Metabolic):  .  etonogestrel-ethinyl estradiol (NUVARING) 0.12-0.015 MG/24HR vaginal ring, CONTINUOUS USE. REMOVE AND INSERT NEW RING EVERY 4 WEEKS.     Current Outpatient Medications (Respiratory):  .  cetirizine (ZYRTEC) 10 MG tablet,  Take 1 tablet (10 mg total) by mouth daily. .  montelukast (SINGULAIR) 10 MG tablet, TAKE 1 TABLET BY MOUTH AT BEDTIME.   Current Outpatient Medications (Analgesics):  Marland Kitchen  Etanercept (ENBREL MINI) 50 MG/ML SOCT, Inject 1 mL into the skin once a week. .  Ibuprofen-Famotidine (DUEXIS) 800-26.6 MG TABS, Take 800 mg by mouth 3 (three) times daily. Marland Kitchen  leflunomide (ARAVA) 20 MG tablet, Take 20 mg by mouth daily.     Current Outpatient Medications (Other):  .  ascorbic acid (VITAMIN C) 1000 MG tablet, Take 1,000 mg by mouth daily. .  busPIRone (BUSPAR) 15 MG tablet, 1  bid .  cholecalciferol (VITAMIN D) 1000 units tablet, Take 5,000 Units by mouth daily. .  clonazePAM (KLONOPIN) 1 MG tablet, Take 1 mg by mouth daily as needed for anxiety (take half a tablet as needed). Marland Kitchen  escitalopram (LEXAPRO) 20 MG tablet, Take 1 tablet (20 mg total)  by mouth daily. Marland Kitchen  esomeprazole (NEXIUM) 40 MG capsule, TAKE 1 CAPSULE BY MOUTH DAILY. Marland Kitchen  gabapentin (NEURONTIN) 300 MG capsule, Take 300 mg by mouth 3 (three) times daily. .  Multiple Vitamin (MULTIVITAMIN) tablet, Take 1 tablet by mouth daily.   .  Omega-3 Fatty Acids (FISH OIL) 1000 MG CAPS, Take 2,000 mg by mouth 2 (two) times daily.  .  Probiotic Product (SOLUBLE FIBER/PROBIOTICS PO), Take 2 capsules by mouth 2 (two) times daily. Marland Kitchen  zolpidem (AMBIEN) 5 MG tablet, Take 1 tablet (5 mg total) by mouth at bedtime as needed for sleep.  Current Facility-Administered Medications (Other):  .  ceFEPIme (MAXIPIME) 2 g in dextrose 5 % 50 mL IVPB    Past medical history, social, surgical and family history all reviewed in electronic medical record.  No pertanent information unless stated regarding to the chief complaint.   Review of Systems:  No headache, visual changes, nausea, vomiting, diarrhea, constipation, dizziness, abdominal pain, skin rash, fevers, chills, night sweats, weight loss, swollen lymph nodes, body aches, joint swelling,chest pain, shortness of breath, mood changes.  Positive muscle aches  Objective  Blood pressure 116/82, pulse 76, height 5\' 10"  (1.778 m), weight 243 lb (110.2 kg), SpO2 96 %.   General: No apparent distress alert and oriented x3 mood and affect normal, dressed appropriately.  HEENT: Pupils equal, extraocular movements intact  Respiratory: Patient's speak in full sentences and does not appear short of breath  Cardiovascular: No lower extremity edema, non tender, no erythema  Skin: Warm dry intact with no signs of infection or rash on extremities or on axial skeleton.  Abdomen: Soft nontender  Neuro: Cranial nerves II through XII are intact, neurovascularly intact in all extremities with 2+ DTRs and 2+ pulses.  Lymph: No lymphadenopathy of posterior or anterior cervical chain or axillae bilaterally.  Gait normal with good balance and coordination.  MSK:  Non  tender with full range of motion and good stability and symmetric strength and tone of shoulders, elbows, wrist, hip, knee and ankles bilaterally.  Neck: Inspection loss of lordosis. No palpable stepoffs. Negative Spurling's maneuver. Mild lack of sidebending bilaterally right and left Grip strength and sensation normal in bilateral hands Strength good C4 to T1 distribution No sensory change to C4 to T1 Negative Hoffman sign bilaterally Reflexes normal Severe tightness of the trapezius bilaterally right greater than left  Osteopathic findings C2 flexed rotated and side bent right C6 flexed rotated and side bent left T3 extended rotated and side bent right inhaled third rib T6 extended rotated and  side bent left L4 flexed rotated and side bent right Sacrum right on right     Impression and Recommendations:     This case required medical decision making of moderate complexity. The above documentation has been reviewed and is accurate and complete Lyndal Pulley, DO       Note: This dictation was prepared with Dragon dictation along with smaller phrase technology. Any transcriptional errors that result from this process are unintentional.

## 2018-09-08 ENCOUNTER — Ambulatory Visit (INDEPENDENT_AMBULATORY_CARE_PROVIDER_SITE_OTHER): Payer: 59 | Admitting: Family Medicine

## 2018-09-08 ENCOUNTER — Encounter: Payer: Self-pay | Admitting: Family Medicine

## 2018-09-08 VITALS — BP 116/82 | HR 76 | Ht 70.0 in | Wt 243.0 lb

## 2018-09-08 DIAGNOSIS — M9908 Segmental and somatic dysfunction of rib cage: Secondary | ICD-10-CM

## 2018-09-08 DIAGNOSIS — M9902 Segmental and somatic dysfunction of thoracic region: Secondary | ICD-10-CM

## 2018-09-08 DIAGNOSIS — M999 Biomechanical lesion, unspecified: Secondary | ICD-10-CM | POA: Diagnosis not present

## 2018-09-08 DIAGNOSIS — M940 Chondrocostal junction syndrome [Tietze]: Secondary | ICD-10-CM | POA: Diagnosis not present

## 2018-09-08 DIAGNOSIS — M9903 Segmental and somatic dysfunction of lumbar region: Secondary | ICD-10-CM

## 2018-09-08 DIAGNOSIS — L409 Psoriasis, unspecified: Secondary | ICD-10-CM | POA: Diagnosis not present

## 2018-09-08 DIAGNOSIS — L304 Erythema intertrigo: Secondary | ICD-10-CM | POA: Diagnosis not present

## 2018-09-08 DIAGNOSIS — M9904 Segmental and somatic dysfunction of sacral region: Secondary | ICD-10-CM

## 2018-09-08 DIAGNOSIS — M9901 Segmental and somatic dysfunction of cervical region: Secondary | ICD-10-CM | POA: Diagnosis not present

## 2018-09-08 NOTE — Assessment & Plan Note (Signed)
Decision today to treat with OMT was based on Physical Exam  After verbal consent patient was treated with HVLA, ME, FPR techniques in cervical, thoracic, rib,  lumbar and sacral areas  Patient tolerated the procedure well with improvement in symptoms  Patient given exercises, stretches and lifestyle modifications  See medications in patient instructions if given  Patient will follow up in 4-8 weeks 

## 2018-09-08 NOTE — Patient Instructions (Signed)
Good to see you as always Ice is your friend  Continue the vitamins Stay active See me again in 4-6 weeks just with all the changes

## 2018-09-08 NOTE — Assessment & Plan Note (Signed)
Patient is a more of a slipped rib syndrome.  Discussed icing regimen and home exercise.  Discussed which activities to do which wants to avoid.  We discussed posture and ergonomics.  Patient did not want to make any medication changes.  Follow-up with me again in 4 to 8 weeks

## 2018-09-09 ENCOUNTER — Other Ambulatory Visit: Payer: Self-pay | Admitting: Pharmacist

## 2018-09-09 MED ORDER — ETANERCEPT 50 MG/ML ~~LOC~~ SOCT: 1.0000 mL | SUBCUTANEOUS | 5 refills | Status: DC

## 2018-09-09 MED FILL — MINOCYCLINE 100 MG CAPSULE: 100 | 30 days supply | Qty: 60 | Fill #0

## 2018-09-10 MED FILL — ENBREL MINI 50 MG/ML SOCT: 50 | 28 days supply | Qty: 4 | Fill #0

## 2018-09-24 ENCOUNTER — Encounter: Payer: Self-pay | Admitting: Obstetrics & Gynecology

## 2018-09-24 ENCOUNTER — Ambulatory Visit (INDEPENDENT_AMBULATORY_CARE_PROVIDER_SITE_OTHER): Payer: 59 | Admitting: Obstetrics & Gynecology

## 2018-09-24 VITALS — BP 160/94 | Ht 70.25 in | Wt 250.0 lb

## 2018-09-24 DIAGNOSIS — E6609 Other obesity due to excess calories: Secondary | ICD-10-CM | POA: Diagnosis not present

## 2018-09-24 DIAGNOSIS — Z01419 Encounter for gynecological examination (general) (routine) without abnormal findings: Secondary | ICD-10-CM | POA: Diagnosis not present

## 2018-09-24 DIAGNOSIS — Z6835 Body mass index (BMI) 35.0-35.9, adult: Secondary | ICD-10-CM | POA: Diagnosis not present

## 2018-09-24 DIAGNOSIS — L739 Follicular disorder, unspecified: Secondary | ICD-10-CM

## 2018-09-24 DIAGNOSIS — Z3044 Encounter for surveillance of vaginal ring hormonal contraceptive device: Secondary | ICD-10-CM | POA: Diagnosis not present

## 2018-09-24 MED ORDER — ETONOGESTREL-ETHINYL ESTRADIOL 0.12-0.015 MG/24HR VA RING: VAGINAL_RING | VAGINAL | 4 refills | Status: DC

## 2018-09-24 NOTE — Progress Notes (Signed)
April [name] 03-22-1976 [id number]   History:    42 y.o. G0 Married  RP:  Established patient presenting for annual gyn exam   HPI: Well on Nuvaring continuous use.  Having mild menses last 3 months, spotting occasionally before.  No pelvic pain.  No pain with IC.  Breasts wnl.  Recent vulvar itching/redness. Seen by Dermato, Psoriasis Dxed.  BMI 35.62.  Not exercising regularly x last 3 months.  Moving soon, remodeling the house they just bought.  Health labs with Fam MD.  Anxiety/Depression stable on treatment.  Past medical history,surgical history, family history and social history were all reviewed and documented in the EPIC chart.  Gynecologic History Patient's last menstrual period was 09/23/2018. Contraception: NuvaRing vaginal inserts Last Pap: 04/2017. Results were: Negative/HPV HR neg, but insufficient TZ cells Last mammogram: 10/2017. Results were: Negative Bone Density: Never Colonoscopy: Never  Obstetric History OB History  Gravida Para Term Preterm AB Living  0 0 0 0 0 0  SAB TAB Ectopic Multiple Live Births  0 0 0 0 0     ROS: A ROS was performed and pertinent positives and negatives are included in the history.  GENERAL: No fevers or chills. HEENT: No change in vision, no earache, sore throat or sinus congestion. NECK: No pain or stiffness. CARDIOVASCULAR: No chest pain or pressure. No palpitations. PULMONARY: No shortness of breath, cough or wheeze. GASTROINTESTINAL: No abdominal pain, nausea, vomiting or diarrhea, melena or bright red blood per rectum. GENITOURINARY: No urinary frequency, urgency, hesitancy or dysuria. MUSCULOSKELETAL: No joint or muscle pain, no back pain, no recent trauma. DERMATOLOGIC: No rash, no itching, no lesions. ENDOCRINE: No polyuria, polydipsia, no heat or cold intolerance. No recent change in weight. HEMATOLOGICAL: No anemia or easy bruising or bleeding. NEUROLOGIC: No headache, seizures, numbness, tingling or weakness.  PSYCHIATRIC: No depression, no loss of interest in normal activity or change in sleep pattern.     Exam:   BP (!) 160/94   Ht 5' 10.25" (1.784 m)   Wt 250 lb (113.4 kg)   LMP 09/23/2018 Comment: nuvaring  BMI 35.62 kg/m   Body mass index is 35.62 kg/m.  General appearance : Well developed well nourished female. No acute distress HEENT: Eyes: no retinal hemorrhage or exudates,  Neck supple, trachea midline, no carotid bruits, no thyroidmegaly Lungs: Clear to auscultation, no rhonchi or wheezes, or rib retractions  Heart: Regular rate and rhythm, no murmurs or gallops Breast:Examined in sitting and supine position were symmetrical in appearance, no palpable masses or tenderness,  no skin retraction, no nipple inversion, no nipple discharge, no skin discoloration, no axillary or supraclavicular lymphadenopathy Abdomen: no palpable masses or tenderness, no rebound or guarding Extremities: no edema or skin discoloration or tenderness  Pelvic: Vulva: Mild erythema at hair follicles             Vagina: No gross lesions or discharge  Cervix: No gross lesions or discharge.  Pap reflex done  Uterus  AV, normal size, shape and consistency, non-tender and mobile  Adnexa  Without masses or tenderness  Anus: Normal  Wet prep negative   Assessment/Plan:  42 y.o. female for annual exam   1. Encounter for routine gynecological examination with Papanicolaou smear of cervix Normal gynecologic exam except for superficial folliculitis at the suprapubic and vulvar areas.  Pap reflex done today.  Breast exam normal.  Will schedule next screening mammogram December 2019.  Health labs with family physician. - Pap IG w/ reflex  to HPV when ASC-U  2. Encounter for surveillance of vaginal ring hormonal contraceptive device Well on continuous NuvaRing.  No contraindication to estrogens/progestins.  Will continue on NuvaRing, prescription sent to pharmacy.  3. Superficial folliculitis Followed by  dermatology.  Prescribed different strengths of corticosteroid ointments.  Will use as needed for inflammation at the suprapubic and vulvar areas.  Informed of the importance to apply corticosteroids only to affected skin.  Will call back for reevaluation if worsening. - WET PREP FOR TRICH, YEAST, CLUE  4. Class 2 obesity due to excess calories without serious comorbidity with body mass index (BMI) of 35.0 to 35.9 in adult Body mass index at 35.62.  Recommend decreased calories/carbs diet such as [employer].  Aerobic physical activity 5 times a week and weightlifting every 2 days.  Other orders - etonogestrel-ethinyl estradiol (NUVARING) 0.12-0.015 MG/24HR vaginal ring; CONTINUOUS USE. REMOVE AND INSERT NEW RING EVERY 4 WEEKS.  Counseling on above issues and coordination of care more than 50% for 10 minutes.  Princess Bruins MD, 4:34 PM 09/24/2018

## 2018-09-25 ENCOUNTER — Encounter: Payer: Self-pay | Admitting: Obstetrics & Gynecology

## 2018-09-25 ENCOUNTER — Ambulatory Visit: Payer: 59 | Admitting: Podiatry

## 2018-09-25 DIAGNOSIS — M2142 Flat foot [pes planus] (acquired), left foot: Secondary | ICD-10-CM

## 2018-09-25 DIAGNOSIS — M722 Plantar fascial fibromatosis: Secondary | ICD-10-CM

## 2018-09-25 DIAGNOSIS — M2141 Flat foot [pes planus] (acquired), right foot: Secondary | ICD-10-CM

## 2018-09-25 DIAGNOSIS — M216X9 Other acquired deformities of unspecified foot: Secondary | ICD-10-CM

## 2018-09-25 DIAGNOSIS — L739 Follicular disorder, unspecified: Secondary | ICD-10-CM | POA: Diagnosis not present

## 2018-09-25 DIAGNOSIS — Z01419 Encounter for gynecological examination (general) (routine) without abnormal findings: Secondary | ICD-10-CM | POA: Diagnosis not present

## 2018-09-25 LAB — WET PREP FOR TRICH, YEAST, CLUE

## 2018-09-25 NOTE — Patient Instructions (Signed)

## 2018-09-25 NOTE — Patient Instructions (Signed)
1. Encounter for routine gynecological examination with Papanicolaou smear of cervix Normal gynecologic exam except for superficial folliculitis at the suprapubic and vulvar areas.  Pap reflex done today.  Breast exam normal.  Will schedule next screening mammogram December 2019.  Health labs with family physician. - Pap IG w/ reflex to HPV when ASC-U  2. Encounter for surveillance of vaginal ring hormonal contraceptive device Well on continuous NuvaRing.  No contraindication to estrogens/progestins.  Will continue on NuvaRing, prescription sent to pharmacy.  3. Superficial folliculitis Followed by dermatology.  Prescribed different strengths of corticosteroid ointments.  Will use as needed for inflammation at the suprapubic and vulvar areas.  Informed of the importance to apply corticosteroids only to affected skin.  Will call back for reevaluation if worsening. - WET PREP FOR TRICH, YEAST, CLUE  4. Class 2 obesity due to excess calories without serious comorbidity with body mass index (BMI) of 35.0 to 35.9 in adult Body mass index at 35.62.  Recommend decreased calories/carbs diet such as [employer].  Aerobic physical activity 5 times a week and weightlifting every 2 days.  Other orders - etonogestrel-ethinyl estradiol (NUVARING) 0.12-0.015 MG/24HR vaginal ring; CONTINUOUS USE. REMOVE AND INSERT NEW RING EVERY 4 WEEKS.  [name], it was a pleasure seeing you today!  I will inform you of your results as soon as they are available.

## 2018-09-28 LAB — PAP IG W/ RFLX HPV ASCU

## 2018-10-02 MED FILL — NUVARING VAGINAL RING: 0.12-0.015 | 84 days supply | Qty: 3 | Fill #0

## 2018-10-05 MED FILL — GABAPENTIN 300 MG CAPSULE: 300 | 30 days supply | Qty: 30 | Fill #0

## 2018-10-05 MED FILL — LEFLUNOMIDE 20 MG TABLET: 20 | 30 days supply | Qty: 30 | Fill #2

## 2018-10-05 MED FILL — ESCITALOPRAM 20 MG TABLET: 20 | 30 days supply | Qty: 30 | Fill #6

## 2018-10-12 MED FILL — ENBREL MINI 50 MG/ML SOCT: 50 | 28 days supply | Qty: 4 | Fill #1

## 2018-10-12 NOTE — Progress Notes (Signed)
Subjective:  [name], female    DOB: 04-Jun-1976,  MRN: [id number]  Chief Complaint  Patient presents with  . Plantar Fasciitis    right    42 y.o. female presents with the above complaint. Reports recurrence of right foot pain.   Review of Systems: Negative except as noted in the HPI. Denies N/V/F/Ch.  Past Medical History:  Diagnosis Date  . ALLERGIC RHINITIS   . ANXIETY DEPRESSION   . CONSTIPATION   . DEPRESSION   . Esophageal reflux   . HYPERCHOLESTEROLEMIA   . OBESITY   . Occipital neuralgia   . Palpitations   . Rheumatoid arthritis ([place]) 12/24/2017    Current Outpatient Medications:  .  ascorbic acid (VITAMIN C) 1000 MG tablet, Take 1,000 mg by mouth daily., Disp: , Rfl:  .  busPIRone (BUSPAR) 15 MG tablet, 1  bid, Disp: 60 tablet, Rfl: 5 .  cetirizine (ZYRTEC) 10 MG tablet, Take 1 tablet (10 mg total) by mouth daily., Disp: 30 tablet, Rfl: 3 .  cholecalciferol (VITAMIN D) 1000 units tablet, Take 5,000 Units by mouth daily., Disp: , Rfl:  .  clonazePAM (KLONOPIN) 1 MG tablet, Take 1 mg by mouth daily as needed for anxiety (take half a tablet as needed)., Disp: , Rfl:  .  escitalopram (LEXAPRO) 20 MG tablet, Take 1 tablet (20 mg total) by mouth daily., Disp: 30 tablet, Rfl: 6 .  esomeprazole (NEXIUM) 40 MG capsule, TAKE 1 CAPSULE BY MOUTH DAILY., Disp: 90 capsule, Rfl: 3 .  Etanercept (ENBREL MINI) 50 MG/ML SOCT, Inject 1 mL into the skin once a week., Disp: 4 mL, Rfl: 5 .  etonogestrel-ethinyl estradiol (NUVARING) 0.12-0.015 MG/24HR vaginal ring, CONTINUOUS USE. REMOVE AND INSERT NEW RING EVERY 4 WEEKS., Disp: 3 each, Rfl: 4 .  gabapentin (NEURONTIN) 300 MG capsule, Take 300 mg by mouth 3 (three) times daily., Disp: , Rfl:  .  Ibuprofen-Famotidine (DUEXIS) 800-26.6 MG TABS, Take 800 mg by mouth 3 (three) times daily., Disp: 90 tablet, Rfl: 3 .  leflunomide (ARAVA) 20 MG tablet, Take 20 mg by mouth daily., Disp: , Rfl:  .  montelukast (SINGULAIR) 10  MG tablet, TAKE 1 TABLET BY MOUTH AT BEDTIME., Disp: 90 tablet, Rfl: 3 .  Multiple Vitamin (MULTIVITAMIN) tablet, Take 1 tablet by mouth daily.  , Disp: , Rfl:  .  Omega-3 Fatty Acids (FISH OIL) 1000 MG CAPS, Take 2,000 mg by mouth 2 (two) times daily. , Disp: , Rfl:  .  Probiotic Product (SOLUBLE FIBER/PROBIOTICS PO), Take 2 capsules by mouth 2 (two) times daily., Disp: , Rfl:  .  zolpidem (AMBIEN) 5 MG tablet, Take 1 tablet (5 mg total) by mouth at bedtime as needed for sleep., Disp: 30 tablet, Rfl: 2  Current Facility-Administered Medications:  .  ceFEPIme (MAXIPIME) 2 g in dextrose 5 % 50 mL IVPB, 2 g, Intravenous, Once, Dixon, Melton Krebs, NP  Social History   Tobacco Use  Smoking Status Never Smoker  Smokeless Tobacco Never Used  Tobacco Comment   Married, lives with spouse    Allergies  Allergen Reactions  . Ciprofloxacin     toxicity  . Levaquin [Levofloxacin] Anxiety    Possible CNS side effects. TOXICITY  . Moxifloxacin     TOXICITY   Objective:  There were no vitals filed for this visit. There is no height or weight on file to calculate BMI. Constitutional Well developed. Well nourished.  Vascular Dorsalis pedis pulses palpable bilaterally. Posterior tibial pulses palpable  bilaterally. Capillary refill normal to all digits.  No cyanosis or clubbing noted. Pedal hair growth normal.  Neurologic Normal speech. Oriented to person, place, and time. Epicritic sensation to light touch grossly present bilaterally.  Dermatologic Nails well groomed and normal in appearance. No open wounds. No skin lesions.  Orthopedic: Normal joint ROM without pain or crepitus bilaterally. No visible deformities. Tender to palpation at the calcaneal tuber right. No pain with calcaneal squeeze right. Ankle ROM diminished range of motion right. Silfverskiold Test: positive right.   Radiographs: None  Assessment:   1. Plantar fasciitis, right   2. Equinus deformity of foot   3.  Pes planus of both feet    Plan:  Patient was evaluated and treated and all questions answered.  Plantar Fasciitis, right - XR reviewed as above.  - Educated on icing and stretching. Instructions given.  - Injection delivered to the plantar fascia as below. - Would benefit from CMOs  Procedure: Injection Tendon/Ligament Location: Right plantar fascia at the glabrous junction; medial approach. Skin Prep: alcohol Injectate: 1 cc 0.5% marcaine plain, 1 cc dexamethasone phosphate, 0.5 cc kenalog 10. Disposition: Patient tolerated procedure well. Injection site dressed with a band-aid.  Return in about 3 weeks (around 10/16/2018) for Plantar fasciitis, Right.

## 2018-10-20 NOTE — Progress Notes (Signed)
[name] [address], [place] [address] Phone: [phone] Subjective:   April [name], am serving as a scribe for Dr. Hulan Saas.  I'm seeing this patient by the request  of:    CC: Back pain  EXN:[id number]  April [name] is a 42 y.o. female coming in with complaint of back pain.  Patient has been doing relatively well.  Patient now feels like she is having a rheumatoid flare at the moment.  Some more tightness than usual.  Some increasing stress.  Had difficulty moving into her new house and has not done with the transition at this time.  Patient has been doing boxes and has moved literally 2-3 times in the course of the last 3 months which probably exacerbated it.  Doing more manual labor at work as well.     Past Medical History:  Diagnosis Date  . ALLERGIC RHINITIS   . ANXIETY DEPRESSION   . CONSTIPATION   . DEPRESSION   . Esophageal reflux   . HYPERCHOLESTEROLEMIA   . OBESITY   . Occipital neuralgia   . Palpitations   . Rheumatoid arthritis ([place]) 12/24/2017   Past Surgical History:  Procedure Laterality Date  . [name] PAST SURGERIES     Social History   Socioeconomic History  . Marital status: Married    Spouse name: Not on file  . Number of children: Not on file  . Years of education: Not on file  . Highest education level: Not on file  Occupational History  . Not on file  Social Needs  . Financial resource strain: Not on file  . Food insecurity:    Worry: Not on file    Inability: Not on file  . Transportation needs:    Medical: Not on file    Non-medical: Not on file  Tobacco Use  . Smoking status: Never Smoker  . Smokeless tobacco: Never Used  . Tobacco comment: Married, lives with spouse  Substance and Sexual Activity  . Alcohol use: [name]  . Drug use: [name]  . Sexual activity: Yes    Partners: Male    Birth control/protection: Inserts    Comment: 1ST INTERCOURSE- 21, PARTNERS- 4   Lifestyle  .  Physical activity:    Days per week: Not on file    Minutes per session: Not on file  . Stress: Not on file  Relationships  . Social connections:    Talks on phone: Not on file    Gets together: Not on file    Attends religious service: Not on file    Active member of club or organization: Not on file    Attends meetings of clubs or organizations: Not on file    Relationship status: Not on file  Other Topics Concern  . Not on file  Social History Narrative  . Not on file   Allergies  Allergen Reactions  . Ciprofloxacin     toxicity  . Levaquin [Levofloxacin] Anxiety    Possible CNS side effects. TOXICITY  . Moxifloxacin     TOXICITY   Family History  Problem Relation Age of Onset  . Hypertension Mother   . Diabetes Mother   . Hyperlipidemia Mother   . Breast cancer Mother 53  . Osteoarthritis Mother   . Hyperlipidemia Father   . Coronary artery disease Father        MI age 25  . Hyperlipidemia Brother     Current Outpatient Medications (Endocrine &  Metabolic):  .  etonogestrel-ethinyl estradiol (NUVARING) 0.12-0.015 MG/24HR vaginal ring, CONTINUOUS USE. REMOVE AND INSERT NEW RING EVERY 4 WEEKS.     Current Outpatient Medications (Respiratory):  .  cetirizine (ZYRTEC) 10 MG tablet, Take 1 tablet (10 mg total) by mouth daily. .  montelukast (SINGULAIR) 10 MG tablet, TAKE 1 TABLET BY MOUTH AT BEDTIME.   Current Outpatient Medications (Analgesics):  Marland Kitchen  Etanercept (ENBREL MINI) 50 MG/ML SOCT, Inject 1 mL into the skin once a week. .  Ibuprofen-Famotidine (DUEXIS) 800-26.6 MG TABS, Take 800 mg by mouth 3 (three) times daily. Marland Kitchen  leflunomide (ARAVA) 20 MG tablet, Take 20 mg by mouth daily.     Current Outpatient Medications (Other):  .  ascorbic acid (VITAMIN C) 1000 MG tablet, Take 1,000 mg by mouth daily. .  busPIRone (BUSPAR) 15 MG tablet, 1  bid .  cholecalciferol (VITAMIN D) 1000 units tablet, Take 5,000 Units by mouth daily. .  clonazePAM (KLONOPIN) 1 MG  tablet, Take 1 mg by mouth daily as needed for anxiety (take half a tablet as needed). Marland Kitchen  escitalopram (LEXAPRO) 20 MG tablet, Take 1 tablet (20 mg total) by mouth daily. Marland Kitchen  esomeprazole (NEXIUM) 40 MG capsule, TAKE 1 CAPSULE BY MOUTH DAILY. Marland Kitchen  gabapentin (NEURONTIN) 300 MG capsule, Take 300 mg by mouth 3 (three) times daily. .  Multiple Vitamin (MULTIVITAMIN) tablet, Take 1 tablet by mouth daily.   .  Omega-3 Fatty Acids (FISH OIL) 1000 MG CAPS, Take 2,000 mg by mouth 2 (two) times daily.  .  Probiotic Product (SOLUBLE FIBER/PROBIOTICS PO), Take 2 capsules by mouth 2 (two) times daily. Marland Kitchen  zolpidem (AMBIEN) 5 MG tablet, Take 1 tablet (5 mg total) by mouth at bedtime as needed for sleep.  Current Facility-Administered Medications (Other):  .  ceFEPIme (MAXIPIME) 2 g in dextrose 5 % 50 mL IVPB    Past medical history, social, surgical and family history all reviewed in electronic medical record.  [name] pertanent information unless stated regarding to the chief complaint.   Review of Systems:  [name] , visual changes, nausea, vomiting, diarrhea, constipation, dizziness, abdominal pain, skin rash, fevers, chills, night sweats, weight loss, swollen lymph nodes, b chest pain, shortness of breath, mood changes.  Positive muscle aches body aches, headaches  Objective  Blood pressure 118/88, pulse (!) 103, height 5\' 10"  (1.778 m), weight 252 lb (114.3 kg), last menstrual period 09/23/2018, SpO2 98 %.   General: [name] apparent distress alert and oriented x3 mood and affect normal, dressed appropriately.  HEENT: Pupils equal, extraocular movements intact  Respiratory: Patient's speak in full sentences and does not appear short of breath  Cardiovascular: [name] lower extremity edema, non tender, [name] erythema  Skin: Warm dry intact with [name] signs of infection or rash on extremities or on axial skeleton.  Abdomen: Soft nontender  Neuro: Cranial nerves II through XII are intact, neurovascularly intact in all  extremities with 2+ DTRs and 2+ pulses.  Lymph: [name] lymphadenopathy of posterior or anterior cervical chain or axillae bilaterally.  Gait normal with good balance and coordination.  MSK:  Non tender with full range of motion and good stability and symmetric strength and tone of shoulders, elbows, wrist, hip, knee and ankles bilaterally.  Neck: Inspection loss of lordosis. [name] palpable stepoffs. Negative Spurling's maneuver. Mild limitation in range of motion lacking last 10 to 15 degrees. Grip strength and sensation normal in bilateral hands Strength good C4 to T1 distribution [name] sensory change to C4 to T1  Negative Hoffman sign bilaterally Reflexes normal Tightness of the trapezius bilaterally. \ Osteopathic findings C2 flexed rotated and side bent right C4 flexed rotated and side bent left C6 flexed rotated and side bent left T3 extended rotated and side bent right inhaled third rib T9 extended rotated and side bent left L2 flexed rotated and side bent right Sacrum right on right    Impression and Recommendations:     This case required medical decision making of moderate complexity. The above documentation has been reviewed and is accurate and complete Lyndal Pulley, DO       Note: This dictation was prepared with Dragon dictation along with smaller phrase technology. Any transcriptional errors that result from this process are unintentional.

## 2018-10-21 ENCOUNTER — Ambulatory Visit (INDEPENDENT_AMBULATORY_CARE_PROVIDER_SITE_OTHER): Payer: 59 | Admitting: Family Medicine

## 2018-10-21 ENCOUNTER — Encounter: Payer: Self-pay | Admitting: Family Medicine

## 2018-10-21 VITALS — BP 118/88 | HR 103 | Ht 70.0 in | Wt 252.0 lb

## 2018-10-21 DIAGNOSIS — M9902 Segmental and somatic dysfunction of thoracic region: Secondary | ICD-10-CM | POA: Diagnosis not present

## 2018-10-21 DIAGNOSIS — M940 Chondrocostal junction syndrome [Tietze]: Secondary | ICD-10-CM

## 2018-10-21 DIAGNOSIS — M9908 Segmental and somatic dysfunction of rib cage: Secondary | ICD-10-CM | POA: Diagnosis not present

## 2018-10-21 DIAGNOSIS — M999 Biomechanical lesion, unspecified: Secondary | ICD-10-CM

## 2018-10-21 DIAGNOSIS — M9901 Segmental and somatic dysfunction of cervical region: Secondary | ICD-10-CM | POA: Diagnosis not present

## 2018-10-21 DIAGNOSIS — M9903 Segmental and somatic dysfunction of lumbar region: Secondary | ICD-10-CM

## 2018-10-21 DIAGNOSIS — M9904 Segmental and somatic dysfunction of sacral region: Secondary | ICD-10-CM

## 2018-10-21 NOTE — Assessment & Plan Note (Signed)
Decision today to treat with OMT was based on Physical Exam  After verbal consent patient was treated with HVLA, ME, FPR techniques in cervical, thoracic, rib lumbar and sacral areas  Patient tolerated the procedure well with improvement in symptoms  Patient given exercises, stretches and lifestyle modifications  See medications in patient instructions if given  Patient will follow up in 6-8 weeks 

## 2018-10-21 NOTE — Patient Instructions (Signed)
Good to see you  Happy holidays!  You are doing great overall  Stay active Wishing you for smooth transition in your house See me again in 6-8 weeks

## 2018-10-21 NOTE — Assessment & Plan Note (Signed)
Patient is doing relatively well.  Manipulation done again today.  Discussed icing regimen and home exercises, ergonomics and proper lifting mechanics.  Discussed avoiding certain activities.  Follow-up with me again in 4 to 8 weeks

## 2018-10-22 MED FILL — MAGIC MOUTHWASH W/KAO/LID: 8 days supply | Qty: 300 | Fill #0

## 2018-10-22 MED FILL — FOLIC ACID 1 MG TABS: 1 | 30 days supply | Qty: 90 | Fill #0

## 2018-10-22 MED FILL — predniSONE 5 MG TABS: 5 | 30 days supply | Qty: 60 | Fill #0

## 2018-10-23 ENCOUNTER — Ambulatory Visit: Payer: 59 | Admitting: Orthotics

## 2018-10-23 ENCOUNTER — Ambulatory Visit: Payer: 59 | Admitting: Podiatry

## 2018-10-23 ENCOUNTER — Encounter: Payer: Self-pay | Admitting: Podiatry

## 2018-10-23 DIAGNOSIS — M722 Plantar fascial fibromatosis: Secondary | ICD-10-CM | POA: Diagnosis not present

## 2018-10-23 DIAGNOSIS — M216X9 Other acquired deformities of unspecified foot: Secondary | ICD-10-CM

## 2018-10-23 DIAGNOSIS — M2141 Flat foot [pes planus] (acquired), right foot: Secondary | ICD-10-CM | POA: Diagnosis not present

## 2018-10-23 DIAGNOSIS — M2142 Flat foot [pes planus] (acquired), left foot: Secondary | ICD-10-CM

## 2018-10-23 NOTE — Progress Notes (Signed)
Patient came in today to pick up custom made foot orthotics.  The goals were accomplished and the patient reported no dissatisfaction with said orthotics.  Patient was advised of breakin period and how to report any issues. 

## 2018-10-27 ENCOUNTER — Telehealth: Payer: Self-pay | Admitting: Pharmacist

## 2018-10-27 ENCOUNTER — Ambulatory Visit (INDEPENDENT_AMBULATORY_CARE_PROVIDER_SITE_OTHER): Payer: 59 | Admitting: Pharmacist

## 2018-10-27 ENCOUNTER — Encounter: Payer: Self-pay | Admitting: Pharmacist

## 2018-10-27 DIAGNOSIS — Z79899 Other long term (current) drug therapy: Secondary | ICD-10-CM

## 2018-10-27 MED ORDER — ABATACEPT 125 MG/ML ~~LOC~~ SOAJ: 1.0000 mL | SUBCUTANEOUS | 5 refills | Status: DC

## 2018-10-27 NOTE — Telephone Encounter (Signed)
Called patient to schedule an appointment for the [facility]. I was unable to reach the patient so I left a HIPAA-compliant message requesting that the patient return my call.   

## 2018-10-27 NOTE — Progress Notes (Signed)
   S: Patient presents to [facility] for review of their specialty medication therapy.  Patient is currently taking Orencia for rheumatoid arthritis. Patient is managed by Marella Chimes for this.   Patient failed Humira and was on Enbrel. Had injection site reactions that would not heal so she is transitioning to [place].   Adherence: she has not started it yet.  SubQ: 125 mg subQ once weekly.   Dose adjustments: Renal impairment: none Hepatic impairment: none Toxicity: discontinue if serious infection develops  Screenings: TB screening: completed per patient Hepatitis Screening: completed per patient Blood glucose: Orencia contains maltose which make falsely elevate glucose levels  Monitoring: S/sx of infection: denies S/sx of hypersensitivity: has not started yet Other adverse effects: has not started yet  O:     Lab Results  Component Value Date   WBC 6.6 05/22/2018   HGB 13.1 05/22/2018   HCT 39.1 05/22/2018   MCV 82.0 05/22/2018   PLT 358.0 05/22/2018      Chemistry      Component Value Date/Time   NA 140 05/22/2018 0910   NA 141 02/21/2014   K 4.0 05/22/2018 0910   CL 106 05/22/2018 0910   CO2 25 05/22/2018 0910   BUN 10 05/22/2018 0910   BUN 13 02/21/2014   CREATININE 0.65 05/22/2018 0910   GLU 93 02/21/2014      Component Value Date/Time   CALCIUM 9.1 05/22/2018 0910   ALKPHOS 63 05/22/2018 0910   AST 17 05/22/2018 0910   ALT 19 05/22/2018 0910   BILITOT 0.3 05/22/2018 0910       A/P: 1. Medication review: Patient currently prescribed Orencia for the treatment of rheumatoid arthritis but she has not started it yet. Reviewed the medication with the patient, including the following: Orencia is a selective T-cell costimulation blocker indicated for rheumatoid arthritis. Patient educated on purpose, proper use and potential adverse effects of Orencia. The most common adverse effects are infections, headache, and injection site reactions. There  is a possible adverse effect of increased risk of malignancy but it is not fully understood if this is due to the drug or the disease state itself. The patient was instructed to avoid use of live vaccinations without the approval of a physician.    Christella Hartigan, PharmD, BCPS, BCACP, CPP Clinical Pharmacist Practitioner  [phone]

## 2018-10-29 DIAGNOSIS — R51 Headache: Secondary | ICD-10-CM | POA: Diagnosis not present

## 2018-10-29 DIAGNOSIS — Z1231 Encounter for screening mammogram for malignant neoplasm of breast: Secondary | ICD-10-CM | POA: Diagnosis not present

## 2018-10-29 DIAGNOSIS — Z803 Family history of malignant neoplasm of breast: Secondary | ICD-10-CM | POA: Diagnosis not present

## 2018-10-29 DIAGNOSIS — G43719 Chronic migraine without aura, intractable, without status migrainosus: Secondary | ICD-10-CM | POA: Diagnosis not present

## 2018-10-29 MED FILL — GABAPENTIN 300 MG CAPSULE: 300 | 30 days supply | Qty: 30 | Fill #0

## 2018-10-30 MED FILL — ORENCIA CLICKJECT 125 MG/ML: 125 | 28 days supply | Qty: 4 | Fill #0

## 2018-11-02 MED FILL — ESCITALOPRAM 20 MG TABLET: 20 | 30 days supply | Qty: 30 | Fill #1

## 2018-11-02 MED FILL — LEFLUNOMIDE 20 MG TABLET: 20 | 30 days supply | Qty: 30 | Fill #0

## 2018-11-02 MED FILL — MONTELUKAST SOD 10 MG TAB: 10 | 90 days supply | Qty: 90 | Fill #1

## 2018-11-12 ENCOUNTER — Ambulatory Visit (HOSPITAL_COMMUNITY): Payer: Self-pay | Admitting: Psychiatry

## 2018-11-24 DIAGNOSIS — L0291 Cutaneous abscess, unspecified: Secondary | ICD-10-CM | POA: Diagnosis not present

## 2018-11-24 DIAGNOSIS — M0589 Other rheumatoid arthritis with rheumatoid factor of multiple sites: Secondary | ICD-10-CM | POA: Diagnosis not present

## 2018-11-24 DIAGNOSIS — Z8709 Personal history of other diseases of the respiratory system: Secondary | ICD-10-CM | POA: Diagnosis not present

## 2018-11-24 DIAGNOSIS — R21 Rash and other nonspecific skin eruption: Secondary | ICD-10-CM | POA: Diagnosis not present

## 2018-11-24 DIAGNOSIS — E669 Obesity, unspecified: Secondary | ICD-10-CM | POA: Diagnosis not present

## 2018-11-24 DIAGNOSIS — M2550 Pain in unspecified joint: Secondary | ICD-10-CM | POA: Diagnosis not present

## 2018-11-24 DIAGNOSIS — Z6836 Body mass index (BMI) 36.0-36.9, adult: Secondary | ICD-10-CM | POA: Diagnosis not present

## 2018-11-24 MED FILL — CELECOXIB 200 MG CAP: 200 | 30 days supply | Qty: 30 | Fill #0

## 2018-11-25 DIAGNOSIS — H52222 Regular astigmatism, left eye: Secondary | ICD-10-CM | POA: Diagnosis not present

## 2018-11-25 DIAGNOSIS — H5212 Myopia, left eye: Secondary | ICD-10-CM | POA: Diagnosis not present

## 2018-11-25 DIAGNOSIS — H5211 Myopia, right eye: Secondary | ICD-10-CM | POA: Diagnosis not present

## 2018-11-25 MED FILL — TOBRAMYCIN-DEXAMETH OPHTH S: 0.3-0.1 | 14 days supply | Qty: 3 | Fill #0

## 2018-11-27 MED FILL — LEFLUNOMIDE 20 MG TABLET: 20 | 30 days supply | Qty: 30 | Fill #1

## 2018-11-27 MED FILL — ESCITALOPRAM 20 MG TABLET: 20 | 30 days supply | Qty: 30 | Fill #2

## 2018-11-27 MED FILL — ESOMEPRAZOLE MAG DR 40 MG C: 40 | 90 days supply | Qty: 90 | Fill #2

## 2018-11-27 MED FILL — ORENCIA CLICKJECT 125 MG/ML: 125 | 28 days supply | Qty: 4 | Fill #1

## 2018-11-27 MED FILL — GABAPENTIN 300 MG CAPSULE: 300 | 30 days supply | Qty: 30 | Fill #1

## 2018-12-02 ENCOUNTER — Other Ambulatory Visit: Payer: Self-pay

## 2018-12-02 ENCOUNTER — Ambulatory Visit: Payer: 59 | Admitting: Family Medicine

## 2018-12-03 ENCOUNTER — Ambulatory Visit: Payer: 59 | Admitting: Podiatry

## 2018-12-03 DIAGNOSIS — M722 Plantar fascial fibromatosis: Secondary | ICD-10-CM | POA: Diagnosis not present

## 2018-12-03 DIAGNOSIS — M2142 Flat foot [pes planus] (acquired), left foot: Secondary | ICD-10-CM

## 2018-12-03 DIAGNOSIS — L409 Psoriasis, unspecified: Secondary | ICD-10-CM

## 2018-12-03 DIAGNOSIS — M2141 Flat foot [pes planus] (acquired), right foot: Secondary | ICD-10-CM | POA: Diagnosis not present

## 2018-12-03 DIAGNOSIS — M216X9 Other acquired deformities of unspecified foot: Secondary | ICD-10-CM | POA: Diagnosis not present

## 2018-12-03 NOTE — Progress Notes (Signed)
Subjective:  [name], female    DOB: 1976/02/20,  MRN: [id number]  Chief Complaint  Patient presents with  . Plantar Fasciitis    6 week foolow up right foot - doing a little better, exercises help    43 y.o. female presents with the above complaint.  States that the pain is doing better but is not all the way resolved states that exercises are helping her but she does not do them all the time.  Has not been wearing the plantar fascial brace.  Simple question today about left hallux nail issue and whether or not she has a fungal nail infection or other than nail infections from her psoriasis.  Review of Systems: Negative except as noted in the HPI. Denies N/V/F/Ch.  Past Medical History:  Diagnosis Date  . ALLERGIC RHINITIS   . ANXIETY DEPRESSION   . CONSTIPATION   . DEPRESSION   . Esophageal reflux   . HYPERCHOLESTEROLEMIA   . OBESITY   . Occipital neuralgia   . Palpitations   . Rheumatoid arthritis ([place]) 12/24/2017    Current Outpatient Medications:  .  Abatacept (ORENCIA CLICKJECT) 166 MG/ML SOAJ, Inject 1 mL into the skin once a week., Disp: 4 mL, Rfl: 5 .  ascorbic acid (VITAMIN C) 1000 MG tablet, Take 1,000 mg by mouth daily., Disp: , Rfl:  .  busPIRone (BUSPAR) 15 MG tablet, 1  bid, Disp: 60 tablet, Rfl: 5 .  celecoxib (CELEBREX) 200 MG capsule, , Disp: , Rfl:  .  cetirizine (ZYRTEC) 10 MG tablet, Take 1 tablet (10 mg total) by mouth daily., Disp: 30 tablet, Rfl: 3 .  cholecalciferol (VITAMIN D) 1000 units tablet, Take 5,000 Units by mouth daily., Disp: , Rfl:  .  clobetasol ointment (TEMOVATE) 0.05 %, APPLY TO AFFECTED AREA TWICE DAILY FOR 10 DAYS, Disp: , Rfl: 0 .  escitalopram (LEXAPRO) 20 MG tablet, Take 1 tablet (20 mg total) by mouth daily., Disp: 30 tablet, Rfl: 6 .  esomeprazole (NEXIUM) 40 MG capsule, TAKE 1 CAPSULE BY MOUTH DAILY., Disp: 90 capsule, Rfl: 3 .  etonogestrel-ethinyl estradiol (NUVARING) 0.12-0.015 MG/24HR vaginal ring,  CONTINUOUS USE. REMOVE AND INSERT NEW RING EVERY 4 WEEKS., Disp: 3 each, Rfl: 4 .  folic acid (FOLVITE) 1 MG tablet, , Disp: , Rfl: 2 .  gabapentin (NEURONTIN) 300 MG capsule, Take 300 mg by mouth 3 (three) times daily., Disp: , Rfl:  .  Ibuprofen-Famotidine (DUEXIS) 800-26.6 MG TABS, Take 800 mg by mouth 3 (three) times daily., Disp: 90 tablet, Rfl: 3 .  leflunomide (ARAVA) 20 MG tablet, Take 20 mg by mouth daily., Disp: , Rfl:  .  LOTEMAX SM 0.38 % GEL, INSTILL 1 DROP IN RIGHT EYE 3 TIMES DAILY FOR 5 DAYS, THEN 1 DROP IN THE RIGHT EYE TWICE DAILY FOR 5 DAYS, Disp: , Rfl: 0 .  minocycline (MINOCIN,DYNACIN) 100 MG capsule, , Disp: , Rfl: 3 .  montelukast (SINGULAIR) 10 MG tablet, TAKE 1 TABLET BY MOUTH AT BEDTIME., Disp: 90 tablet, Rfl: 3 .  Multiple Vitamin (MULTIVITAMIN) tablet, Take 1 tablet by mouth daily.  , Disp: , Rfl:  .  nitroGLYCERIN (NITRODUR - DOSED IN MG/24 HR) 0.2 mg/hr patch, 1/4 patch daily, Disp: , Rfl:  .  Omega-3 Fatty Acids (FISH OIL) 1000 MG CAPS, Take 2,000 mg by mouth 2 (two) times daily. , Disp: , Rfl:  .  predniSONE (DELTASONE) 5 MG tablet, , Disp: , Rfl: 1 .  Probiotic Product (SOLUBLE FIBER/PROBIOTICS  PO), Take 2 capsules by mouth 2 (two) times daily., Disp: , Rfl:  .  tobramycin-dexamethasone (TOBRADEX) ophthalmic solution, , Disp: , Rfl:  .  zolpidem (AMBIEN) 5 MG tablet, Take 1 tablet (5 mg total) by mouth at bedtime as needed for sleep., Disp: 30 tablet, Rfl: 2  Current Facility-Administered Medications:  .  ceFEPIme (MAXIPIME) 2 g in dextrose 5 % 50 mL IVPB, 2 g, Intravenous, Once, Dixon, Melton Krebs, NP  Social History   Tobacco Use  Smoking Status Never Smoker  Smokeless Tobacco Never Used  Tobacco Comment   Married, lives with spouse    Allergies  Allergen Reactions  . Ciprofloxacin     toxicity  . Levaquin [Levofloxacin] Anxiety    Possible CNS side effects. TOXICITY  . Moxifloxacin     TOXICITY   Objective:  There were no vitals filed for  this visit. There is no height or weight on file to calculate BMI. Constitutional Well developed. Well nourished.  Vascular Dorsalis pedis pulses palpable bilaterally. Posterior tibial pulses palpable bilaterally. Capillary refill normal to all digits.  No cyanosis or clubbing noted. Pedal hair growth normal.  Neurologic Normal speech. Oriented to person, place, and time. Epicritic sensation to light touch grossly present bilaterally.  Dermatologic Nails well groomed and normal in appearance. No open wounds. No skin lesions.  Orthopedic: Normal joint ROM without pain or crepitus bilaterally. No visible deformities. Tender to palpation at the calcaneal tuber right. No pain with calcaneal squeeze right. Ankle ROM diminished range of motion right. Silfverskiold Test: positive right.   Radiographs: None  Assessment:   1. Plantar fasciitis, right   2. Psoriasis of nail   3. Pes planus of both feet   4. Equinus deformity of foot    Plan:  Patient was evaluated and treated and all questions answered.  Plantar Fasciitis, right -Hold off injection today would consider repeat injection in a month or 2.  Continue use of plantar fascial brace or orthotics.  Would consider PT referral should pain persist would consider surgical intervention the patient states that her pain is not severe enough at this point to consider that.  Psoriatic nail disease -Offered nail matrix injection patient declined -Discussed etiology   No follow-ups on file.

## 2018-12-04 ENCOUNTER — Ambulatory Visit: Payer: 59 | Admitting: Podiatry

## 2018-12-05 DIAGNOSIS — M940 Chondrocostal junction syndrome [Tietze]: Secondary | ICD-10-CM | POA: Insufficient documentation

## 2018-12-09 MED FILL — CELECOXIB 200 MG CAP: 200 | 30 days supply | Qty: 60 | Fill #0

## 2018-12-09 MED FILL — FOLIC ACID 1 MG TABS: 1 | 30 days supply | Qty: 90 | Fill #1

## 2018-12-10 NOTE — Progress Notes (Signed)
[name] [address] [place], [place] [address] Phone: [phone] Subjective:   [name], am serving as a scribe for Dr. Hulan Saas.   CC: Back pain follow-up  [id number]  April [name] is a 43 y.o. female coming in with complaint of back and neck pain. Notes one week ago that she did have sternum pain. Took colchicine and this helped to alleviate her pain. Is here for OMT today to manage her back pain.  Patient states some mild tightness overall.  Nothing as severe as what she has had.  Feels that the rib is in place.      Past Medical History:  Diagnosis Date  . ALLERGIC RHINITIS   . ANXIETY DEPRESSION   . CONSTIPATION   . DEPRESSION   . Esophageal reflux   . HYPERCHOLESTEROLEMIA   . OBESITY   . Occipital neuralgia   . Palpitations   . Rheumatoid arthritis ([place]) 12/24/2017   Past Surgical History:  Procedure Laterality Date  . NO PAST SURGERIES     Social History   Socioeconomic History  . Marital status: Married    Spouse name: Not on file  . Number of children: Not on file  . Years of education: Not on file  . Highest education level: Not on file  Occupational History  . Not on file  Social Needs  . Financial resource strain: Not on file  . Food insecurity:    Worry: Not on file    Inability: Not on file  . Transportation needs:    Medical: Not on file    Non-medical: Not on file  Tobacco Use  . Smoking status: Never Smoker  . Smokeless tobacco: Never Used  . Tobacco comment: Married, lives with spouse  Substance and Sexual Activity  . Alcohol use: No  . Drug use: No  . Sexual activity: Yes    Partners: Male    Birth control/protection: Inserts    Comment: 1ST INTERCOURSE- 21, PARTNERS- 3   Lifestyle  . Physical activity:    Days per week: Not on file    Minutes per session: Not on file  . Stress: Not on file  Relationships  . Social connections:    Talks on phone: Not on file    Gets  together: Not on file    Attends religious service: Not on file    Active member of club or organization: Not on file    Attends meetings of clubs or organizations: Not on file    Relationship status: Not on file  Other Topics Concern  . Not on file  Social History Narrative  . Not on file   Allergies  Allergen Reactions  . Ciprofloxacin     toxicity  . Levaquin [Levofloxacin] Anxiety    Possible CNS side effects. TOXICITY  . Moxifloxacin     TOXICITY   Family History  Problem Relation Age of Onset  . Hypertension Mother   . Diabetes Mother   . Hyperlipidemia Mother   . Breast cancer Mother 72  . Osteoarthritis Mother   . Hyperlipidemia Father   . Coronary artery disease Father        MI age 47  . Hyperlipidemia Brother     Current Outpatient Medications (Endocrine & Metabolic):  .  etonogestrel-ethinyl estradiol (NUVARING) 0.12-0.015 MG/24HR vaginal ring, CONTINUOUS USE. REMOVE AND INSERT NEW RING EVERY 4 WEEKS. .  predniSONE (DELTASONE) 5 MG tablet,    Current Outpatient Medications (Cardiovascular):  .  nitroGLYCERIN (NITRODUR - DOSED IN MG/24 HR) 0.2 mg/hr patch, 1/4 patch daily   Current Outpatient Medications (Respiratory):  .  cetirizine (ZYRTEC) 10 MG tablet, Take 1 tablet (10 mg total) by mouth daily. .  montelukast (SINGULAIR) 10 MG tablet, TAKE 1 TABLET BY MOUTH AT BEDTIME.   Current Outpatient Medications (Analgesics):  Marland Kitchen  Abatacept (ORENCIA CLICKJECT) 185 MG/ML SOAJ, Inject 1 mL into the skin once a week. .  celecoxib (CELEBREX) 200 MG capsule,  .  Ibuprofen-Famotidine (DUEXIS) 800-26.6 MG TABS, Take 800 mg by mouth 3 (three) times daily. Marland Kitchen  leflunomide (ARAVA) 20 MG tablet, Take 20 mg by mouth daily.   Current Outpatient Medications (Hematological):  .  folic acid (FOLVITE) 1 MG tablet,    Current Outpatient Medications (Other):  .  ascorbic acid (VITAMIN C) 1000 MG tablet, Take 1,000 mg by mouth daily. .  busPIRone (BUSPAR) 15 MG tablet, 1   bid .  cholecalciferol (VITAMIN D) 1000 units tablet, Take 5,000 Units by mouth daily. .  clobetasol ointment (TEMOVATE) 0.05 %, APPLY TO AFFECTED AREA TWICE DAILY FOR 10 DAYS .  escitalopram (LEXAPRO) 20 MG tablet, Take 1 tablet (20 mg total) by mouth daily. Marland Kitchen  esomeprazole (NEXIUM) 40 MG capsule, TAKE 1 CAPSULE BY MOUTH DAILY. Marland Kitchen  gabapentin (NEURONTIN) 300 MG capsule, Take 300 mg by mouth 3 (three) times daily. Marland Kitchen  LOTEMAX SM 0.38 % GEL, INSTILL 1 DROP IN RIGHT EYE 3 TIMES DAILY FOR 5 DAYS, THEN 1 DROP IN THE RIGHT EYE TWICE DAILY FOR 5 DAYS .  minocycline (MINOCIN,DYNACIN) 100 MG capsule,  .  Multiple Vitamin (MULTIVITAMIN) tablet, Take 1 tablet by mouth daily.   .  Omega-3 Fatty Acids (FISH OIL) 1000 MG CAPS, Take 2,000 mg by mouth 2 (two) times daily.  .  Probiotic Product (SOLUBLE FIBER/PROBIOTICS PO), Take 2 capsules by mouth 2 (two) times daily. Marland Kitchen  tobramycin-dexamethasone (TOBRADEX) ophthalmic solution,  .  zolpidem (AMBIEN) 5 MG tablet, Take 1 tablet (5 mg total) by mouth at bedtime as needed for sleep.  Current Facility-Administered Medications (Other):  .  ceFEPIme (MAXIPIME) 2 g in dextrose 5 % 50 mL IVPB    Past medical history, social, surgical and family history all reviewed in electronic medical record.  No pertanent information unless stated regarding to the chief complaint.   Review of Systems:  No headache, visual changes, nausea, vomiting, diarrhea, constipation, dizziness, abdominal pain, skin rash, fevers, chills, night sweats, weight loss, swollen lymph nodes, body aches, joint swelling, muscle aches, chest pain, shortness of breath, mood changes.   Objective  Blood pressure 132/78, pulse 88, height 5\' 10"  (1.778 m), weight 245 lb (111.1 kg), SpO2 98 %.    General: No apparent distress alert and oriented x3 mood and affect normal, dressed appropriately.  HEENT: Pupils equal, extraocular movements intact  Respiratory: Patient's speak in full sentences and does not  appear short of breath  Cardiovascular: No lower extremity edema, non tender, no erythema  Skin: Warm dry intact with no signs of infection or rash on extremities or on axial skeleton.  Abdomen: Soft nontender  Neuro: Cranial nerves II through XII are intact, neurovascularly intact in all extremities with 2+ DTRs and 2+ pulses.  Lymph: No lymphadenopathy of posterior or anterior cervical chain or axillae bilaterally.  Gait normal with good balance and coordination.  MSK:  Non tender with full range of motion and good stability and symmetric strength and tone of shoulders, elbows, wrist, hip, knee and ankles  bilaterally.  Neck: Inspection mild loss of lordosis. No palpable stepoffs. Negative Spurling's maneuver. Limited range of motion/sidebending Grip strength and sensation normal in bilateral hands Strength good C4 to T1 distribution No sensory change to C4 to T1 Negative Hoffman sign bilaterally Reflexes normal  Osteopathic findings C6 flexed rotated and side bent left T3 extended rotated and side bent right inhaled third rib T7 extended rotated and side bent left L3 flexed rotated and side bent right Sacrum right on right    Impression and Recommendations:     This case required medical decision making of moderate complexity. The above documentation has been reviewed and is accurate and complete Lyndal Pulley, DO       Note: This dictation was prepared with Dragon dictation along with smaller phrase technology. Any transcriptional errors that result from this process are unintentional.

## 2018-12-11 ENCOUNTER — Encounter: Payer: Self-pay | Admitting: Family Medicine

## 2018-12-11 ENCOUNTER — Ambulatory Visit (INDEPENDENT_AMBULATORY_CARE_PROVIDER_SITE_OTHER): Payer: 59 | Admitting: Family Medicine

## 2018-12-11 VITALS — BP 132/78 | HR 88 | Ht 70.0 in | Wt 245.0 lb

## 2018-12-11 DIAGNOSIS — M5412 Radiculopathy, cervical region: Secondary | ICD-10-CM

## 2018-12-11 DIAGNOSIS — H10413 Chronic giant papillary conjunctivitis, bilateral: Secondary | ICD-10-CM | POA: Diagnosis not present

## 2018-12-11 DIAGNOSIS — M999 Biomechanical lesion, unspecified: Secondary | ICD-10-CM | POA: Diagnosis not present

## 2018-12-11 DIAGNOSIS — H04123 Dry eye syndrome of bilateral lacrimal glands: Secondary | ICD-10-CM | POA: Diagnosis not present

## 2018-12-11 NOTE — Assessment & Plan Note (Signed)
Decision today to treat with OMT was based on Physical Exam  After verbal consent patient was treated with HVLA, ME, FPR techniques in cervical, thoracic, rib lumbar and sacral areas  Patient tolerated the procedure well with improvement in symptoms  Patient given exercises, stretches and lifestyle modifications  See medications in patient instructions if given  Patient will follow up in 4-8 weeks 

## 2018-12-11 NOTE — Patient Instructions (Signed)
Good to see you  Ice is your friend Stay active I am here if you need me See me again in 6 weeks

## 2018-12-11 NOTE — Assessment & Plan Note (Signed)
Cervical radicular pain seems to be stable at the moment.  No significant radicular pain but does have some tightness of the neck.  Mild limited range of motion especially with sidebending.  Patient has responded well to osteopathic manipulation.  Discussed core stability and posture ergonomics.  Follow-up again in 4 to 8 weeks

## 2018-12-13 NOTE — Progress Notes (Signed)
Subjective:  [name], female    DOB: [birth year],  MRN: [id number]  Chief Complaint  Patient presents with  . Plantar Fasciitis    right - injection helped for about a day  - exercises help, brace helps a little   (she thinks it might be too small)    43 y.o. female presents with the above complaint. History above confirmed with patient.  Review of Systems: Negative except as noted in the HPI. Denies N/V/F/Ch.  Past Medical History:  Diagnosis Date  . ALLERGIC RHINITIS   . ANXIETY DEPRESSION   . CONSTIPATION   . DEPRESSION   . Esophageal reflux   . HYPERCHOLESTEROLEMIA   . OBESITY   . Occipital neuralgia   . Palpitations   . Rheumatoid arthritis ([place]) 12/24/2017    Current Outpatient Medications:  .  nitroGLYCERIN (NITRODUR - DOSED IN MG/24 HR) 0.2 mg/hr patch, 1/4 patch daily, Disp: , Rfl:  .  Abatacept (ORENCIA CLICKJECT) 458 MG/ML SOAJ, Inject 1 mL into the skin once a week., Disp: 4 mL, Rfl: 5 .  ascorbic acid (VITAMIN C) 1000 MG tablet, Take 1,000 mg by mouth daily., Disp: , Rfl:  .  busPIRone (BUSPAR) 15 MG tablet, 1  bid, Disp: 60 tablet, Rfl: 5 .  celecoxib (CELEBREX) 200 MG capsule, , Disp: , Rfl:  .  cetirizine (ZYRTEC) 10 MG tablet, Take 1 tablet (10 mg total) by mouth daily., Disp: 30 tablet, Rfl: 3 .  cholecalciferol (VITAMIN D) 1000 units tablet, Take 5,000 Units by mouth daily., Disp: , Rfl:  .  clobetasol ointment (TEMOVATE) 0.05 %, APPLY TO AFFECTED AREA TWICE DAILY FOR 10 DAYS, Disp: , Rfl: 0 .  escitalopram (LEXAPRO) 20 MG tablet, Take 1 tablet (20 mg total) by mouth daily., Disp: 30 tablet, Rfl: 6 .  esomeprazole (NEXIUM) 40 MG capsule, TAKE 1 CAPSULE BY MOUTH DAILY., Disp: 90 capsule, Rfl: 3 .  etonogestrel-ethinyl estradiol (NUVARING) 0.12-0.015 MG/24HR vaginal ring, CONTINUOUS USE. REMOVE AND INSERT NEW RING EVERY 4 WEEKS., Disp: 3 each, Rfl: 4 .  folic acid (FOLVITE) 1 MG tablet, , Disp: , Rfl: 2 .  gabapentin (NEURONTIN) 300 MG  capsule, Take 300 mg by mouth 3 (three) times daily., Disp: , Rfl:  .  Ibuprofen-Famotidine (DUEXIS) 800-26.6 MG TABS, Take 800 mg by mouth 3 (three) times daily., Disp: 90 tablet, Rfl: 3 .  leflunomide (ARAVA) 20 MG tablet, Take 20 mg by mouth daily., Disp: , Rfl:  .  LOTEMAX SM 0.38 % GEL, INSTILL 1 DROP IN RIGHT EYE 3 TIMES DAILY FOR 5 DAYS, THEN 1 DROP IN THE RIGHT EYE TWICE DAILY FOR 5 DAYS, Disp: , Rfl: 0 .  minocycline (MINOCIN,DYNACIN) 100 MG capsule, , Disp: , Rfl: 3 .  montelukast (SINGULAIR) 10 MG tablet, TAKE 1 TABLET BY MOUTH AT BEDTIME., Disp: 90 tablet, Rfl: 3 .  Multiple Vitamin (MULTIVITAMIN) tablet, Take 1 tablet by mouth daily.  , Disp: , Rfl:  .  Omega-3 Fatty Acids (FISH OIL) 1000 MG CAPS, Take 2,000 mg by mouth 2 (two) times daily. , Disp: , Rfl:  .  predniSONE (DELTASONE) 5 MG tablet, , Disp: , Rfl: 1 .  Probiotic Product (SOLUBLE FIBER/PROBIOTICS PO), Take 2 capsules by mouth 2 (two) times daily., Disp: , Rfl:  .  tobramycin-dexamethasone (TOBRADEX) ophthalmic solution, , Disp: , Rfl:  .  zolpidem (AMBIEN) 5 MG tablet, Take 1 tablet (5 mg total) by mouth at bedtime as needed for sleep., Disp: 30 tablet,  Rfl: 2  Current Facility-Administered Medications:  .  ceFEPIme (MAXIPIME) 2 g in dextrose 5 % 50 mL IVPB, 2 g, Intravenous, Once, Dixon, Melton Krebs, NP  Social History   Tobacco Use  Smoking Status Never Smoker  Smokeless Tobacco Never Used  Tobacco Comment   Married, lives with spouse    Allergies  Allergen Reactions  . Ciprofloxacin     toxicity  . Levaquin [Levofloxacin] Anxiety    Possible CNS side effects. TOXICITY  . Moxifloxacin     TOXICITY   Objective:  There were no vitals filed for this visit. There is no height or weight on file to calculate BMI. Constitutional Well developed. Well nourished.  Vascular Dorsalis pedis pulses palpable bilaterally. Posterior tibial pulses palpable bilaterally. Capillary refill normal to all digits.  No  cyanosis or clubbing noted. Pedal hair growth normal.  Neurologic Normal speech. Oriented to person, place, and time. Epicritic sensation to light touch grossly present bilaterally.  Dermatologic Nails well groomed and normal in appearance. No open wounds. No skin lesions.  Orthopedic: Normal joint ROM without pain or crepitus bilaterally. No visible deformities. Tender to palpation at the calcaneal tuber right. No pain with calcaneal squeeze right. Ankle ROM diminished range of motion right. Silfverskiold Test: positive right.   Radiographs: None  Assessment:   1. Plantar fasciitis, right   2. Equinus deformity of foot   3. Pes planus of both feet    Plan:  Patient was evaluated and treated and all questions answered.  Plantar Fasciitis, right - Continue stretching and icing. - Would benefit from CMOs. Casted today - Plantar fascial brace dispensed today.  Return in about 6 weeks (around 12/04/2018) for Plantar fasciitis.

## 2018-12-15 ENCOUNTER — Ambulatory Visit: Payer: Self-pay

## 2018-12-15 ENCOUNTER — Emergency Department (HOSPITAL_BASED_OUTPATIENT_CLINIC_OR_DEPARTMENT_OTHER): Payer: 59

## 2018-12-15 ENCOUNTER — Encounter: Payer: Self-pay | Admitting: Family Medicine

## 2018-12-15 ENCOUNTER — Other Ambulatory Visit: Payer: Self-pay

## 2018-12-15 ENCOUNTER — Emergency Department (HOSPITAL_BASED_OUTPATIENT_CLINIC_OR_DEPARTMENT_OTHER)
Admission: EM | Admit: 2018-12-15 | Discharge: 2018-12-15 | Disposition: A | Discharge status: Home / Self Care | Payer: 59 | Attending: Emergency Medicine | Admitting: Emergency Medicine

## 2018-12-15 ENCOUNTER — Ambulatory Visit: Payer: 59 | Admitting: Family Medicine

## 2018-12-15 ENCOUNTER — Encounter (HOSPITAL_BASED_OUTPATIENT_CLINIC_OR_DEPARTMENT_OTHER): Payer: Self-pay | Admitting: *Deleted

## 2018-12-15 VITALS — BP 138/80 | HR 135 | Temp 97.7°F | Ht 70.0 in

## 2018-12-15 DIAGNOSIS — Z79899 Other long term (current) drug therapy: Secondary | ICD-10-CM | POA: Diagnosis not present

## 2018-12-15 DIAGNOSIS — R000 Tachycardia, unspecified: Secondary | ICD-10-CM | POA: Diagnosis not present

## 2018-12-15 DIAGNOSIS — R0600 Dyspnea, unspecified: Secondary | ICD-10-CM | POA: Insufficient documentation

## 2018-12-15 DIAGNOSIS — R0602 Shortness of breath: Secondary | ICD-10-CM

## 2018-12-15 DIAGNOSIS — R0609 Other forms of dyspnea: Secondary | ICD-10-CM

## 2018-12-15 DIAGNOSIS — R61 Generalized hyperhidrosis: Secondary | ICD-10-CM | POA: Diagnosis not present

## 2018-12-15 DIAGNOSIS — R079 Chest pain, unspecified: Secondary | ICD-10-CM | POA: Diagnosis not present

## 2018-12-15 DIAGNOSIS — R0789 Other chest pain: Secondary | ICD-10-CM

## 2018-12-15 LAB — CBC WITH DIFFERENTIAL/PLATELET
Abs Immature Granulocytes: 0.02 10*3/uL (ref 0.00–0.07)
Basophils Absolute: 0 10*3/uL (ref 0.0–0.1)
Basophils Relative: 0 %
Eosinophils Absolute: 0 10*3/uL (ref 0.0–0.5)
Eosinophils Relative: 0 %
HEMATOCRIT: 41.5 % (ref 36.0–46.0)
HEMOGLOBIN: 13 g/dL (ref 12.0–15.0)
Immature Granulocytes: 0 %
LYMPHS PCT: 10 %
Lymphs Abs: 0.9 10*3/uL (ref 0.7–4.0)
MCH: 26.5 pg (ref 26.0–34.0)
MCHC: 31.3 g/dL (ref 30.0–36.0)
MCV: 84.7 fL (ref 80.0–100.0)
Monocytes Absolute: 0.3 10*3/uL (ref 0.1–1.0)
Monocytes Relative: 3 %
Neutro Abs: 7.9 10*3/uL — ABNORMAL HIGH (ref 1.7–7.7)
Neutrophils Relative %: 87 %
Platelets: 463 10*3/uL — ABNORMAL HIGH (ref 150–400)
RBC: 4.9 MIL/uL (ref 3.87–5.11)
RDW: 14.4 % (ref 11.5–15.5)
WBC: 9.2 10*3/uL (ref 4.0–10.5)
nRBC: 0 % (ref 0.0–0.2)

## 2018-12-15 LAB — COMPREHENSIVE METABOLIC PANEL
ALK PHOS: 92 U/L (ref 38–126)
ALT: 21 U/L (ref 0–44)
AST: 25 U/L (ref 15–41)
Albumin: 3.5 g/dL (ref 3.5–5.0)
Anion gap: 12 (ref 5–15)
BUN: 11 mg/dL (ref 6–20)
CO2: 18 mmol/L — ABNORMAL LOW (ref 22–32)
Calcium: 9.4 mg/dL (ref 8.9–10.3)
Chloride: 106 mmol/L (ref 98–111)
Creatinine, Ser: 0.76 mg/dL (ref 0.44–1.00)
GFR calc Af Amer: >60 mL/min (ref >60)
GFR calc non Af Amer: >60 mL/min (ref >60)
Glucose, Bld: 127 mg/dL — ABNORMAL HIGH (ref 70–99)
Potassium: 3.5 mmol/L (ref 3.5–5.1)
Sodium: 136 mmol/L (ref 135–145)
Total Bilirubin: 0.3 mg/dL (ref 0.3–1.2)
Total Protein: 8.7 g/dL — ABNORMAL HIGH (ref 6.5–8.1)

## 2018-12-15 LAB — TROPONIN I
Troponin I: <0.03 ng/mL (ref <0.03)
Troponin I: <0.03 ng/mL (ref <0.03)

## 2018-12-15 LAB — D-DIMER, QUANTITATIVE: D-Dimer, Quant: 0.5 ug/mL-FEU (ref 0.00–0.50)

## 2018-12-15 LAB — PREGNANCY, URINE: Preg Test, Ur: NEGATIVE

## 2018-12-15 MED ORDER — IOPAMIDOL (ISOVUE-370) INJECTION 76%
100.0000 mL | Freq: Once | INTRAVENOUS | Status: AC | PRN
  Administered 2018-12-15: 76 mL via INTRAVENOUS

## 2018-12-15 MED ORDER — LIDOCAINE VISCOUS HCL 2 % MT SOLN
15.0000 mL | Freq: Once | OROMUCOSAL | Status: AC
  Administered 2018-12-15: 15 mL via OROMUCOSAL
  Filled 2018-12-15: qty 15

## 2018-12-15 MED ORDER — SODIUM CHLORIDE 0.9 % IV BOLUS
1000.0000 mL | Freq: Once | INTRAVENOUS | Status: AC
  Administered 2018-12-15: 1000 mL via INTRAVENOUS

## 2018-12-15 NOTE — ED Provider Notes (Signed)
Assumed care from Dr Ralene Bathe at change of shift. CTa w/o evidence of PE or other concerning pathology. Repeat troponin remains normal. She is still having some discomfort although improved. She is declining any additional meds. She is followed by rheumatology. She is already on Nexium.   It has been determined that no acute conditions requiring further emergency intervention are present at this time. The patient has been advised of the diagnosis and plan. I reviewed any labs and imaging including any potential incidental findings. I have reviewed nursing notes and appropriate previous records. We have discussed signs and symptoms that warrant return to the ED and they are listed in the discharge instructions.       Virgel Manifold, MD 12/15/18 1827

## 2018-12-15 NOTE — Telephone Encounter (Signed)
Pt. Called to report pain upon deep breath since approx. 11:00 AM 1/27.  Reported she has Rheumatoid Arthritis that is not very well controlled at this time.  Stated the pain is located in the mid sternal region with pain radiating to bilateral shoulders, bilateral neck, and back.  Stated the pain intensifies with deep breath; rated @ 7/10.  Denied shortness of breath.  Denied fever/ chills.  Stated she recently completed a steroid for costochondritis.  Voiced concern about having pleuritic pain. No avail. Appts. With PCP office.  Appt. Sched. At 1:00 PM with [facility] at [facility] for eval.  Care advice given per protocol.  Encouraged to go to ER, if her symptoms worsen, prior to appt. at 1:00 PM.  Agreed with plan.      Reason for Disposition . Taking a deep breath makes pain worse  Answer Assessment - Initial Assessment Questions 1. LOCATION: "Where does it hurt?"       Mid-sternal pain  2. RADIATION: "Does the pain go anywhere else?" (e.g., into neck, jaw, arms, back)     Radiates to bilat shoulders, bilat neck and back 3. ONSET: "When did the chest pain begin?" (Minutes, hours or days)      11:15 AM  4. PATTERN "Does the pain come and go, or has it been constant since it started?"  "Does it get worse with exertion?"      Constant; pain intensifies with deep breath 5. DURATION: "How long does it last" (e.g., seconds, minutes, hours)     Constant  6. SEVERITY: "How bad is the pain?"  (e.g., Scale 1-10; mild, moderate, or severe)    - MILD (1-3): doesn't interfere with normal activities     - MODERATE (4-7): interferes with normal activities or awakens from sleep    - SEVERE (8-10): excruciating pain, unable to do any normal activities       7/10 7. CARDIAC RISK FACTORS: "Do you have any history of heart problems or risk factors for heart disease?" (e.g., prior heart attack, angina; high blood pressure, diabetes, being overweight, high cholesterol, smoking, or strong family history of heart  disease)     Denied heart hx. ; denied DM, HTN, Hyperlipidemia; is non smoker; father with hx of heart disease  8. PULMONARY RISK FACTORS: "Do you have any history of lung disease?"  (e.g., blood clots in lung, asthma, emphysema, birth control pills)    Denied PE hx. ; denied asthma or emphysema 9. CAUSE: "What do you think is causing the chest pain?"     Rheumatoid arthritis with poss. Pleuritic pain  10. OTHER SYMPTOMS: "Do you have any other symptoms?" (e.g., dizziness, nausea, vomiting, sweating, fever, difficulty breathing, cough)       Denied shortness of breath; c/o pain upon deep breath; denied fever/ chills  11. PREGNANCY: "Is there any chance you are pregnant?" "When was your last menstrual period?"       Has vaginal ring; does not have reg. periods  Protocols used: CHEST PAIN-A-AH

## 2018-12-15 NOTE — Progress Notes (Signed)
Established Patient Office Visit  Subjective:  [name], female    DOB: 13-Jun-1976  Age: 43 y.o. MRN: [id number]  CC:  Chief Complaint  Patient presents with  . Chest Pain    HPI [name] presents for evaluation treatment of a 1 day history of pain in her anterior chest with deep inspiration.  There has been no injury.  She has a history of rheumatoid arthritis that does affect her sterno clavicular joints.  She denies, cough productive of phlegm or hemoptysis.  Pain has radiated up her neck into both jaws.  There is been an exertional component to it.  Slight nausea.  No history of DVT.  Just prior to arriving at the clinic she has developed a tachycardia.  Status post recent 4-hour car drive to and fro without a break.  Denies calf pain.  Lipid profile in July of this past year showed an LDL of 110 and an HDL of 60.  TSH was 0.83.  Father diagnosed with CAD stress test at age 40.  Patient does not smoke, drink alcohol or use illicit drugs. She has been taking Abatacept and Arvava for over a year.   Past Medical History:  Diagnosis Date  . ALLERGIC RHINITIS   . ANXIETY DEPRESSION   . CONSTIPATION   . DEPRESSION   . Esophageal reflux   . HYPERCHOLESTEROLEMIA   . OBESITY   . Occipital neuralgia   . Palpitations   . Rheumatoid arthritis ([place]) 12/24/2017    Past Surgical History:  Procedure Laterality Date  . NO PAST SURGERIES      Family History  Problem Relation Age of Onset  . Hypertension Mother   . Diabetes Mother   . Hyperlipidemia Mother   . Breast cancer Mother 37  . Osteoarthritis Mother   . Hyperlipidemia Father   . Coronary artery disease Father        MI age 24  . Hyperlipidemia Brother     Social History   Socioeconomic History  . Marital status: Married    Spouse name: Not on file  . Number of children: Not on file  . Years of education: Not on file  . Highest education level: Not on file  Occupational History  . Not  on file  Social Needs  . Financial resource strain: Not on file  . Food insecurity:    Worry: Not on file    Inability: Not on file  . Transportation needs:    Medical: Not on file    Non-medical: Not on file  Tobacco Use  . Smoking status: Never Smoker  . Smokeless tobacco: Never Used  . Tobacco comment: Married, lives with spouse  Substance and Sexual Activity  . Alcohol use: No  . Drug use: No  . Sexual activity: Yes    Partners: Male    Birth control/protection: Inserts    Comment: 1ST INTERCOURSE- 21, PARTNERS- 4   Lifestyle  . Physical activity:    Days per week: Not on file    Minutes per session: Not on file  . Stress: Not on file  Relationships  . Social connections:    Talks on phone: Not on file    Gets together: Not on file    Attends religious service: Not on file    Active member of club or organization: Not on file    Attends meetings of clubs or organizations: Not on file    Relationship status: Not on file  .  Intimate partner violence:    Fear of current or ex partner: Not on file    Emotionally abused: Not on file    Physically abused: Not on file    Forced sexual activity: Not on file  Other Topics Concern  . Not on file  Social History Narrative  . Not on file    Outpatient Medications Prior to Visit  Medication Sig Dispense Refill  . Abatacept (ORENCIA CLICKJECT) 710 MG/ML SOAJ Inject 1 mL into the skin once a week. 4 mL 5  . ascorbic acid (VITAMIN C) 1000 MG tablet Take 1,000 mg by mouth daily.    . celecoxib (CELEBREX) 200 MG capsule     . cetirizine (ZYRTEC) 10 MG tablet Take 1 tablet (10 mg total) by mouth daily. 30 tablet 3  . cholecalciferol (VITAMIN D) 1000 units tablet Take 5,000 Units by mouth daily.    . clobetasol ointment (TEMOVATE) 0.05 % APPLY TO AFFECTED AREA TWICE DAILY FOR 10 DAYS  0  . escitalopram (LEXAPRO) 20 MG tablet Take 1 tablet (20 mg total) by mouth daily. 30 tablet 6  . esomeprazole (NEXIUM) 40 MG capsule TAKE 1  CAPSULE BY MOUTH DAILY. 90 capsule 3  . etonogestrel-ethinyl estradiol (NUVARING) 0.12-0.015 MG/24HR vaginal ring CONTINUOUS USE. REMOVE AND INSERT NEW RING EVERY 4 WEEKS. 3 each 4  . folic acid (FOLVITE) 1 MG tablet Take 2 mg by mouth daily.    . Ibuprofen-Famotidine (DUEXIS) 800-26.6 MG TABS Take 800 mg by mouth 3 (three) times daily. 90 tablet 3  . leflunomide (ARAVA) 20 MG tablet Take 20 mg by mouth daily.    . montelukast (SINGULAIR) 10 MG tablet TAKE 1 TABLET BY MOUTH AT BEDTIME. 90 tablet 3  . Multiple Vitamin (MULTIVITAMIN) tablet Take 1 tablet by mouth daily.      . Omega-3 Fatty Acids (FISH OIL) 1000 MG CAPS Take 2,000 mg by mouth 2 (two) times daily.     . predniSONE (DELTASONE) 5 MG tablet   1  . Probiotic Product (SOLUBLE FIBER/PROBIOTICS PO) Take 2 capsules by mouth 2 (two) times daily.    Marland Kitchen zolpidem (AMBIEN) 5 MG tablet Take 1 tablet (5 mg total) by mouth at bedtime as needed for sleep. 30 tablet 2  . busPIRone (BUSPAR) 15 MG tablet 1  bid 60 tablet 5  . folic acid (FOLVITE) 1 MG tablet   2  . gabapentin (NEURONTIN) 300 MG capsule Take 300 mg by mouth 3 (three) times daily.    Marland Kitchen LOTEMAX SM 0.38 % GEL INSTILL 1 DROP IN RIGHT EYE 3 TIMES DAILY FOR 5 DAYS, THEN 1 DROP IN THE RIGHT EYE TWICE DAILY FOR 5 DAYS  0  . minocycline (MINOCIN,DYNACIN) 100 MG capsule   3  . nitroGLYCERIN (NITRODUR - DOSED IN MG/24 HR) 0.2 mg/hr patch 1/4 patch daily    . tobramycin-dexamethasone (TOBRADEX) ophthalmic solution      Facility-Administered Medications Prior to Visit  Medication Dose Route Frequency Provider Last Rate Last Dose  . ceFEPIme (MAXIPIME) 2 g in dextrose 5 % 50 mL IVPB  2 g Intravenous Once [place] Callas, NP        Allergies  Allergen Reactions  . Ciprofloxacin     toxicity  . Levaquin [Levofloxacin] Anxiety    Possible CNS side effects. TOXICITY  . Moxifloxacin     TOXICITY    ROS Review of Systems  Constitutional: Negative for diaphoresis, fatigue, fever and  unexpected weight change.  HENT: Negative.  Eyes: Negative for photophobia and visual disturbance.  Respiratory: Positive for chest tightness and shortness of breath. Negative for wheezing.   Cardiovascular: Positive for chest pain and palpitations.  Gastrointestinal: Positive for nausea. Negative for vomiting.  Endocrine: Negative for polyphagia and polyuria.  Musculoskeletal: Positive for arthralgias.      Objective:    Physical Exam  Constitutional: She is oriented to person, place, and time. She appears well-developed and well-nourished. She appears distressed (mild).  HENT:  Head: Normocephalic and atraumatic.  Right Ear: External ear normal.  Left Ear: External ear normal.  Mouth/Throat: Oropharynx is clear and moist. No oropharyngeal exudate.  Eyes: Pupils are equal, round, and reactive to light. Conjunctivae are normal. Right eye exhibits no discharge. Left eye exhibits no discharge. No scleral icterus.  Neck: Neck supple. No JVD present. No tracheal deviation present. No thyromegaly present.  Cardiovascular: Regular rhythm and normal heart sounds. Tachycardia present.  Pulmonary/Chest: Effort normal and breath sounds normal.  Lymphadenopathy:    She has no cervical adenopathy.  Neurological: She is alert and oriented to person, place, and time.  Skin: Skin is warm and dry. She is not diaphoretic.  Psychiatric: She has a normal mood and affect. Her behavior is normal.    BP 138/80   Pulse (!) 135   Temp 97.7 F (36.5 C) (Oral)   Ht 5\' 10"  (1.778 m)   SpO2 97%   BMI 35.15 kg/m  Wt Readings from Last 3 Encounters:  12/11/18 245 lb (111.1 kg)  10/21/18 252 lb (114.3 kg)  09/24/18 250 lb (113.4 kg)   BP Readings from Last 3 Encounters:  12/15/18 138/80  12/11/18 132/78  10/21/18 118/88   Guideline developer:  UpToDate (see UpToDate for funding source) Date Released: June 2014  Health Maintenance Due  Topic Date Due  . INFLUENZA VACCINE  06/18/2018     There are no preventive care reminders to display for this patient.  Lab Results  Component Value Date   TSH 0.83 05/22/2018   Lab Results  Component Value Date   WBC 6.6 05/22/2018   HGB 13.1 05/22/2018   HCT 39.1 05/22/2018   MCV 82.0 05/22/2018   PLT 358.0 05/22/2018   Lab Results  Component Value Date   NA 140 05/22/2018   K 4.0 05/22/2018   CO2 25 05/22/2018   GLUCOSE 87 05/22/2018   BUN 10 05/22/2018   CREATININE 0.65 05/22/2018   BILITOT 0.3 05/22/2018   ALKPHOS 63 05/22/2018   AST 17 05/22/2018   ALT 19 05/22/2018   PROT 7.5 05/22/2018   ALBUMIN 3.7 05/22/2018   CALCIUM 9.1 05/22/2018   ANIONGAP 8 12/20/2016   GFR 106.53 05/22/2018   Lab Results  Component Value Date   CHOL 202 (H) 05/22/2018   Lab Results  Component Value Date   HDL 64.80 05/22/2018   Lab Results  Component Value Date   LDLCALC 110 (H) 05/22/2018   Lab Results  Component Value Date   TRIG 136.0 05/22/2018   Lab Results  Component Value Date   CHOLHDL 3 05/22/2018   Lab Results  Component Value Date   HGBA1C 5.6 05/22/2018      Assessment & Plan:   Problem List Items Addressed This Visit      Other   Tachycardia - Primary   Relevant Orders   EKG 12-Lead (Completed)   Other chest pain   DOE (dyspnea on exertion)      No orders of the defined types were placed  in this encounter.   Follow-up: No follow-ups on file.   Patient with chest pain, pleurodynia, shortness of breath and dyspnea on exertion.  Question rheumatoid associated pericarditis, PE, pleurisy?   EKG does show tachycardia without ST segment elevation.  Patient will go directly to ER for evaluation.

## 2018-12-15 NOTE — ED Notes (Signed)
NAD at this time. Pt is stable and going home.  

## 2018-12-15 NOTE — ED Notes (Addendum)
Pt c/o central CP with radiation to left jaw since yesterday, jaw pain has resolved per pt.. Pt denies arm radiation pain. Pt also c/o dizziness. Pt states she feels relief when lying back on stretcher. Pt rates pain 5/10. Pt denies vomiting, but is having mild nausea.

## 2018-12-15 NOTE — ED Provider Notes (Signed)
[facility] EMERGENCY DEPARTMENT Provider Note   CSN: [id number] Arrival date & time: 12/15/18  1338     History   Chief Complaint Chief Complaint  Patient presents with  . Shortness of Breath    HPI April [name] is a 43 y.o. female.  ph  The history is provided by the patient. No language interpreter was used.  Shortness of Breath   April [name] is a 43 y.o. female who presents to the Emergency Department complaining of chest pain. She presents to the emergency department complaining of severe central chest pain that began yesterday. She has associated shortness of breath. Her pain is worse with sitting and leaning forward. She feels like she cannot take a deep breath secondary to pain. Pain is throughout her central sternum and radiates up to her jaw bilaterally. She has associated diaphoresis secondary to pain. She denies any fevers, cough, abdominal pain, vomiting, diarrhea. She has a history of rheumatoid arthritis. No history of DVT or PE. She does use a nuvaring and recently had a four hour trip to [place]. No lower extremity swelling or pain.  Past Medical History:  Diagnosis Date  . ALLERGIC RHINITIS   . ANXIETY DEPRESSION   . CONSTIPATION   . DEPRESSION   . Esophageal reflux   . HYPERCHOLESTEROLEMIA   . OBESITY   . Occipital neuralgia   . Palpitations   . Rheumatoid arthritis ([place]) 12/24/2017    Patient Active Problem List   Diagnosis Date Noted  . Tachycardia 12/15/2018  . Other chest pain 12/15/2018  . DOE (dyspnea on exertion) 12/15/2018  . Nonallopathic lesion of lumbosacral region 06/08/2018  . Nonallopathic lesion of sacral region 06/08/2018  . PICC (peripherally inserted central catheter) in place 04/19/2018  . 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 Acute nasopharyngitis 02/20/2018  . Allergic rhinitis due to animal hair and dander 02/20/2018  . Non-recurrent acute serous otitis media of  right ear 02/20/2018  . Rheumatoid arthritis ([place]) 12/24/2017  . Trigger point of shoulder region, right 12/09/2017  . Lumbar paraspinal muscle spasm 11/13/2017  . Cervical radicular pain 09/18/2017  . Poor posture 08/14/2017  . Eustachian tube dysfunction 01/07/2017  . Chronic sinusitis 01/07/2017  . Vertigo 12/31/2016  . 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 Nonallopathic lesion of thoracic region 06/03/2016  . Nonallopathic lesion-rib cage 06/03/2016  . Nonallopathic lesion of cervical region 06/03/2016  . Folliculitis due to Pseudomonas aeruginosa 05/22/2016  . Acute left-sided thoracic back pain 05/22/2016  . Hyperglycemia 05/03/2016  . Preventative health care 05/04/2015  . Left hamstring injury 01/24/2015  . Major depressive disorder, recurrent episode, moderate ([place]) 12/02/2014  . Occipital neuralgia   . ANEMIA-NOS 12/11/2009  . Hyperlipidemia 11/16/2008  . Obesity 11/16/2008  . ANXIETY DEPRESSION 11/16/2008  . Allergic rhinitis 11/16/2008  . Esophageal reflux 11/16/2008  . Constipation 11/16/2008  . Palpitations 11/16/2008    Past Surgical History:  Procedure Laterality Date  . NO PAST SURGERIES       OB History    Gravida  0   Para  0   Term  0   Preterm  0   AB  0   Living  0     SAB  0   TAB  0   Ectopic  0   Multiple  0   Live Births  0            Home Medications    Prior to Admission medications  Medication Sig Start Date End Date Taking? Authorizing Provider  Abatacept (ORENCIA CLICKJECT) 956 MG/ML SOAJ Inject 1 mL into the skin once a week. 10/27/18   Tresa Garter, MD  ascorbic acid (VITAMIN C) 1000 MG tablet Take 1,000 mg by mouth daily.    [provider]  celecoxib (CELEBREX) 200 MG capsule  11/24/18   [provider]  cetirizine (ZYRTEC) 10 MG tablet Take 1 tablet (10 mg total) by mouth daily. 05/03/16   Biagio Borg, MD  cholecalciferol (VITAMIN D) 1000 units tablet Take 5,000 Units by mouth daily.     [provider]  clobetasol ointment (TEMOVATE) 0.05 % APPLY TO AFFECTED AREA TWICE DAILY FOR 10 DAYS 08/27/18   [provider]  escitalopram (LEXAPRO) 20 MG tablet Take 1 tablet (20 mg total) by mouth daily. 03/13/18   Plovsky, Berneta Sages, MD  esomeprazole (NEXIUM) 40 MG capsule TAKE 1 CAPSULE BY MOUTH DAILY. 05/27/18   Biagio Borg, MD  etonogestrel-ethinyl estradiol (NUVARING) 0.12-0.015 MG/24HR vaginal ring CONTINUOUS USE. REMOVE AND INSERT NEW RING EVERY 4 WEEKS. 09/24/18   Princess Bruins, MD  folic acid (FOLVITE) 1 MG tablet Take 2 mg by mouth daily.    [provider]  Ibuprofen-Famotidine (DUEXIS) 800-26.6 MG TABS Take 800 mg by mouth 3 (three) times daily. 10/22/17   Lyndal Pulley, DO  leflunomide (ARAVA) 20 MG tablet Take 20 mg by mouth daily.    [provider]  montelukast (SINGULAIR) 10 MG tablet TAKE 1 TABLET BY MOUTH AT BEDTIME. 08/03/18   Biagio Borg, MD  Multiple Vitamin (MULTIVITAMIN) tablet Take 1 tablet by mouth daily.      [provider]  Omega-3 Fatty Acids (FISH OIL) 1000 MG CAPS Take 2,000 mg by mouth 2 (two) times daily.     [provider]  predniSONE (DELTASONE) 5 MG tablet  10/22/18   [provider]  Probiotic Product (SOLUBLE FIBER/PROBIOTICS PO) Take 2 capsules by mouth 2 (two) times daily.    [provider]  zolpidem (AMBIEN) 5 MG tablet Take 1 tablet (5 mg total) by mouth at bedtime as needed for sleep. 05/07/17   Norma Fredrickson, MD    Family History Family History  Problem Relation Age of Onset  . Hypertension Mother   . Diabetes Mother   . Hyperlipidemia Mother   . Breast cancer Mother 52  . Osteoarthritis Mother   . Hyperlipidemia Father   . Coronary artery disease Father        MI age 38  . Hyperlipidemia Brother     Social History Social History   Tobacco Use  . Smoking status: Never Smoker  . Smokeless tobacco: Never Used  . Tobacco comment: Married, lives with spouse    Substance Use Topics  . Alcohol use: No  . Drug use: No     Allergies   Ciprofloxacin; Levaquin [levofloxacin]; and Moxifloxacin   Review of Systems Review of Systems  Respiratory: Positive for shortness of breath.   All other systems reviewed and are negative.    Physical Exam Updated Vital Signs BP (!) 164/109   Pulse (!) 129   Temp 98.2 F (36.8 C) (Oral)   Resp (!) 30   Ht 5\' 10"  (1.778 m)   Wt 111.1 kg   SpO2 100%   BMI 35.14 kg/m   Physical Exam Vitals signs and nursing note reviewed.  Constitutional:      Appearance: She is well-developed.  HENT:  Head: Normocephalic and atraumatic.  Cardiovascular:     Rate and Rhythm: Regular rhythm.     Heart sounds: No murmur.     Comments: tachycardic Pulmonary:     Effort: No respiratory distress.     Breath sounds: Normal breath sounds.     Comments: tachypnea Abdominal:     Palpations: Abdomen is soft.     Tenderness: There is no abdominal tenderness. There is no guarding or rebound.  Musculoskeletal:        General: No tenderness.  Skin:    General: Skin is warm.     Coloration: Skin is pale.     Comments: clammy  Neurological:     Mental Status: She is alert and oriented to person, place, and time.  Psychiatric:        Behavior: Behavior normal.      ED Treatments / Results  Labs (all labs ordered are listed, but only abnormal results are displayed) Labs Reviewed  COMPREHENSIVE METABOLIC PANEL  TROPONIN I  CBC WITH DIFFERENTIAL/PLATELET  D-DIMER, QUANTITATIVE (NOT AT [facility])  PREGNANCY, URINE    EKG None  Radiology No results found.  Procedures Procedures (including critical care time)  Medications Ordered in ED Medications  sodium chloride 0.9 % bolus 1,000 mL (has no administration in time range)     Initial Impression / Assessment and Plan / ED Course  I have reviewed the triage vital signs and the nursing notes.  Pertinent labs & imaging results that were available  during my care of the patient were reviewed by me and considered in my medical decision making (see chart for details).    Patient with history of rheumatoid arthritis, reflux here for evaluation of positional chest pain as well as pleuritic chest pain. She is markedly tachycardic on initial evaluation uncomfortable appearing. On repeat assessment heart rate improved with IV fluid hydration. Initial labs are reassuring. Patient care transferred pending CT chest, repeat troponin. Final Clinical Impressions(s) / ED Diagnoses   Final diagnoses:  None    ED Discharge Orders    None       Quintella Reichert, MD 12/16/18 [phone]

## 2018-12-15 NOTE — ED Triage Notes (Signed)
Sob since yesterday. She has pain in her jaw. This am she was seen by her MD and had a rapid heart rate. On arrival to the ED she is pale, she appears anxious and is hyperventilating with heart rate 130. EKG done at triage.

## 2018-12-23 MED FILL — COLCHICINE 0.6 MG TABS: 0.6 | 30 days supply | Qty: 60 | Fill #1

## 2018-12-23 MED FILL — ESCITALOPRAM 20 MG TABLET: 20 | 30 days supply | Qty: 30 | Fill #3

## 2018-12-23 MED FILL — GABAPENTIN 300 MG CAPSULE: 300 | 30 days supply | Qty: 30 | Fill #2

## 2018-12-23 MED FILL — predniSONE 5 MG TABS: 5 | 30 days supply | Qty: 60 | Fill #1

## 2018-12-23 MED FILL — LEFLUNOMIDE 20 MG TABLET: 20 | 30 days supply | Qty: 30 | Fill #0

## 2018-12-23 MED FILL — ORENCIA CLICKJECT 125 MG/ML: 125 | 28 days supply | Qty: 4 | Fill #2

## 2018-12-25 MED FILL — ETONOGESTREL-ETHINYL ESTRAD: 0.12-0.015 | 84 days supply | Qty: 3 | Fill #1 | Status: TO

## 2019-01-15 ENCOUNTER — Encounter: Payer: Self-pay | Admitting: Family Medicine

## 2019-01-15 ENCOUNTER — Other Ambulatory Visit: Payer: Self-pay

## 2019-01-15 MED ORDER — IBUPROFEN-FAMOTIDINE 800-26.6 MG PO TABS: 1.0000 | ORAL_TABLET | Freq: Three times a day (TID) | ORAL | 3 refills | Status: DC | PRN

## 2019-01-21 ENCOUNTER — Other Ambulatory Visit (HOSPITAL_COMMUNITY): Payer: Self-pay | Admitting: Psychiatry

## 2019-01-21 MED FILL — GABAPENTIN 300 MG CAPSULE: 300 | 30 days supply | Qty: 30 | Fill #3 | Status: TO

## 2019-01-21 MED FILL — LEFLUNOMIDE 20 MG TABLET: 20 | 30 days supply | Qty: 30 | Fill #1 | Status: TO

## 2019-01-21 MED FILL — MONTELUKAST SOD 10 MG TAB: 10 | 90 days supply | Qty: 90 | Fill #2

## 2019-01-22 DIAGNOSIS — M0589 Other rheumatoid arthritis with rheumatoid factor of multiple sites: Secondary | ICD-10-CM | POA: Diagnosis not present

## 2019-01-22 DIAGNOSIS — E669 Obesity, unspecified: Secondary | ICD-10-CM | POA: Diagnosis not present

## 2019-01-22 DIAGNOSIS — M2550 Pain in unspecified joint: Secondary | ICD-10-CM | POA: Diagnosis not present

## 2019-01-22 DIAGNOSIS — Z8709 Personal history of other diseases of the respiratory system: Secondary | ICD-10-CM | POA: Diagnosis not present

## 2019-01-22 DIAGNOSIS — R21 Rash and other nonspecific skin eruption: Secondary | ICD-10-CM | POA: Diagnosis not present

## 2019-01-22 DIAGNOSIS — L0291 Cutaneous abscess, unspecified: Secondary | ICD-10-CM | POA: Diagnosis not present

## 2019-01-22 DIAGNOSIS — K121 Other forms of stomatitis: Secondary | ICD-10-CM | POA: Diagnosis not present

## 2019-01-22 DIAGNOSIS — Z6836 Body mass index (BMI) 36.0-36.9, adult: Secondary | ICD-10-CM | POA: Diagnosis not present

## 2019-01-22 DIAGNOSIS — L409 Psoriasis, unspecified: Secondary | ICD-10-CM | POA: Diagnosis not present

## 2019-01-25 ENCOUNTER — Encounter: Payer: Self-pay | Admitting: Internal Medicine

## 2019-01-25 ENCOUNTER — Ambulatory Visit: Payer: 59 | Admitting: Family Medicine

## 2019-01-25 MED ORDER — ESCITALOPRAM OXALATE 20 MG PO TABS: 20.0000 mg | ORAL_TABLET | Freq: Every day | ORAL | 1 refills | Status: DC

## 2019-01-25 NOTE — Telephone Encounter (Signed)
Done erx 

## 2019-01-26 MED FILL — ESCITALOPRAM 20 MG TABLET: 20 | 90 days supply | Qty: 90 | Fill #0

## 2019-01-28 ENCOUNTER — Encounter: Payer: Self-pay | Admitting: Family Medicine

## 2019-01-28 ENCOUNTER — Ambulatory Visit (INDEPENDENT_AMBULATORY_CARE_PROVIDER_SITE_OTHER): Payer: 59 | Admitting: Family Medicine

## 2019-01-28 ENCOUNTER — Other Ambulatory Visit: Payer: Self-pay

## 2019-01-28 VITALS — BP 126/82 | HR 88 | Ht 70.0 in | Wt 251.0 lb

## 2019-01-28 DIAGNOSIS — M940 Chondrocostal junction syndrome [Tietze]: Secondary | ICD-10-CM

## 2019-01-28 DIAGNOSIS — M999 Biomechanical lesion, unspecified: Secondary | ICD-10-CM | POA: Diagnosis not present

## 2019-01-28 NOTE — Progress Notes (Signed)
[name] [address] [place], [place] [address] Phone: [phone] Subjective:   I [name] am serving as a Education administrator for Dr. Hulan Saas.   CC: Back pain follow-up  [id number]  April [name] is a 43 y.o. female coming in with complaint of back pain. States that she is feeling good. Sternum is painful. Ribs doing great. Ended up in emergency room due to costochondritis pain.  Patient states still some mild discomfort in this area.  Was not responding to colchicine and needed the prednisone.  Patient states that the back pain is stable overall.  Not affecting daily activities on a regular basis.      Past Medical History:  Diagnosis Date  . ALLERGIC RHINITIS   . ANXIETY DEPRESSION   . CONSTIPATION   . DEPRESSION   . Esophageal reflux   . HYPERCHOLESTEROLEMIA   . OBESITY   . Occipital neuralgia   . Palpitations   . Rheumatoid arthritis ([place]) 12/24/2017   Past Surgical History:  Procedure Laterality Date  . NO PAST SURGERIES     Social History   Socioeconomic History  . Marital status: Married    Spouse name: Not on file  . Number of children: Not on file  . Years of education: Not on file  . Highest education level: Not on file  Occupational History  . Not on file  Social Needs  . Financial resource strain: Not on file  . Food insecurity:    Worry: Not on file    Inability: Not on file  . Transportation needs:    Medical: Not on file    Non-medical: Not on file  Tobacco Use  . Smoking status: Never Smoker  . Smokeless tobacco: Never Used  . Tobacco comment: Married, lives with spouse  Substance and Sexual Activity  . Alcohol use: No  . Drug use: No  . Sexual activity: Yes    Partners: Male    Birth control/protection: Inserts    Comment: 1ST INTERCOURSE- 21, PARTNERS- 4   Lifestyle  . Physical activity:    Days per week: Not on file    Minutes per session: Not on file  . Stress: Not on file  Relationships   . Social connections:    Talks on phone: Not on file    Gets together: Not on file    Attends religious service: Not on file    Active member of club or organization: Not on file    Attends meetings of clubs or organizations: Not on file    Relationship status: Not on file  Other Topics Concern  . Not on file  Social History Narrative  . Not on file   Allergies  Allergen Reactions  . Ciprofloxacin     toxicity  . Levaquin [Levofloxacin] Anxiety    Possible CNS side effects. TOXICITY  . Moxifloxacin     TOXICITY   Family History  Problem Relation Age of Onset  . Hypertension Mother   . Diabetes Mother   . Hyperlipidemia Mother   . Breast cancer Mother 41  . Osteoarthritis Mother   . Hyperlipidemia Father   . Coronary artery disease Father        MI age 78  . Hyperlipidemia Brother     Current Outpatient Medications (Endocrine & Metabolic):  .  etonogestrel-ethinyl estradiol (NUVARING) 0.12-0.015 MG/24HR vaginal ring, CONTINUOUS USE. REMOVE AND INSERT NEW RING EVERY 4 WEEKS. .  predniSONE (DELTASONE) 5 MG tablet,  Current Outpatient Medications (Respiratory):  .  cetirizine (ZYRTEC) 10 MG tablet, Take 1 tablet (10 mg total) by mouth daily. .  montelukast (SINGULAIR) 10 MG tablet, TAKE 1 TABLET BY MOUTH AT BEDTIME.   Current Outpatient Medications (Analgesics):  .  celecoxib (CELEBREX) 200 MG capsule,  .  Ibuprofen-Famotidine (DUEXIS) 800-26.6 MG TABS, Take 800 mg by mouth 3 (three) times daily. .  Ibuprofen-Famotidine (DUEXIS) 800-26.6 MG TABS, Take 1 tablet by mouth 3 (three) times daily as needed. .  leflunomide (ARAVA) 20 MG tablet, Take 20 mg by mouth daily.   Current Outpatient Medications (Hematological):  .  folic acid (FOLVITE) 1 MG tablet, Take 2 mg by mouth daily.   Current Outpatient Medications (Other):  .  ascorbic acid (VITAMIN C) 1000 MG tablet, Take 1,000 mg by mouth daily. .  cholecalciferol (VITAMIN D) 1000 units tablet, Take 5,000 Units  by mouth daily. .  clobetasol ointment (TEMOVATE) 0.05 %, APPLY TO AFFECTED AREA TWICE DAILY FOR 10 DAYS .  escitalopram (LEXAPRO) 20 MG tablet, Take 1 tablet (20 mg total) by mouth daily. Marland Kitchen  esomeprazole (NEXIUM) 40 MG capsule, TAKE 1 CAPSULE BY MOUTH DAILY. .  Multiple Vitamin (MULTIVITAMIN) tablet, Take 1 tablet by mouth daily.   .  Omega-3 Fatty Acids (FISH OIL) 1000 MG CAPS, Take 2,000 mg by mouth 2 (two) times daily.  .  Probiotic Product (SOLUBLE FIBER/PROBIOTICS PO), Take 2 capsules by mouth 2 (two) times daily. Marland Kitchen  zolpidem (AMBIEN) 5 MG tablet, Take 1 tablet (5 mg total) by mouth at bedtime as needed for sleep.  Current Facility-Administered Medications (Other):  .  ceFEPIme (MAXIPIME) 2 g in dextrose 5 % 50 mL IVPB    Past medical history, social, surgical and family history all reviewed in electronic medical record.  No pertanent information unless stated regarding to the chief complaint.   Review of Systems:  No headache, visual changes, nausea, vomiting, diarrhea, constipation, dizziness, abdominal pain, skin rash, fevers, chills, night sweats, weight loss, swollen lymph nodes, body aches, joint swelling,  chest pain, shortness of breath, mood changes.  Positive muscle aches  Objective  Blood pressure 126/82, pulse 88, height 5\' 10"  (1.778 m), weight 251 lb (113.9 kg), SpO2 97 %. Systems examined below as of    General: No apparent distress alert and oriented x3 mood and affect normal, dressed appropriately.  HEENT: Pupils equal, extraocular movements intact  Respiratory: Patient's speak in full sentences and does not appear short of breath  Cardiovascular: No lower extremity edema, non tender, no erythema minimal pain on the right side of the sternum Skin: Warm dry intact with no signs of infection or rash on extremities or on axial skeleton.  Abdomen: Soft nontender  Neuro: Cranial nerves II through XII are intact, neurovascularly intact in all extremities with 2+ DTRs and  2+ pulses.  Lymph: No lymphadenopathy of posterior or anterior cervical chain or axillae bilaterally.  Gait normal with good balance and coordination.  MSK:  tender with full range of motion and good stability and symmetric strength and tone of shoulders, elbows, wrist, hip, knee and ankles bilaterally.   Osteopathic findings C2 flexed rotated and side bent right T3 extended rotated and side bent right inhaled third rib T7 extended rotated and side bent left L3 flexed rotated and side bent left  Sacrum right on right     Impression and Recommendations:     This case required medical decision making of moderate complexity. The above documentation has been reviewed  and is accurate and complete Lyndal Pulley, DO       Note: This dictation was prepared with Dragon dictation along with smaller phrase technology. Any transcriptional errors that result from this process are unintentional.

## 2019-01-28 NOTE — Assessment & Plan Note (Signed)
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  I do think that that can contribute to some of the sternum pain.  Concern for more on the reflux aspect and we discussed over-the-counter medications to add to her prescription medications.  We discussed icing regimen and home exercises.  We discussed core strengthening and stability exercises.  Follow-up again in 4 to 8 weeks.

## 2019-01-28 NOTE — Patient Instructions (Signed)
Good to see you  Ice is your friend Ice 20 minutes 2 times daily. Usually after activity and before bed. Add prilosec at night for 2 weeks See me again in 6 weeks

## 2019-01-28 NOTE — Assessment & Plan Note (Signed)
Decision today to treat with OMT was based on Physical Exam  After verbal consent patient was treated with HVLA, ME, FPR techniques in cervical, thoracic, rib,  lumbar and sacral areas  Patient tolerated the procedure well with improvement in symptoms  Patient given exercises, stretches and lifestyle modifications  See medications in patient instructions if given  Patient will follow up in 4-8 weeks 

## 2019-02-03 DIAGNOSIS — M0589 Other rheumatoid arthritis with rheumatoid factor of multiple sites: Secondary | ICD-10-CM | POA: Diagnosis not present

## 2019-02-16 MED FILL — ESOMEPRAZOLE MAG DR 40 MG C: 40 | 90 days supply | Qty: 90 | Fill #0

## 2019-02-16 MED FILL — GABAPENTIN 300 MG CAPSULE: 300 | 30 days supply | Qty: 30 | Fill #0

## 2019-02-16 MED FILL — LEFLUNOMIDE 20 MG TABLET: 20 | 30 days supply | Qty: 30 | Fill #0

## 2019-02-20 IMAGING — CT CT MAXILLOFACIAL W/O CM
4 of 5 series · 16 of 47 positions shown, 18 images · non-contrast
Comparison: 12/20/2016

CLINICAL DATA: Chronic sinusitis. Right ear congestion for the last
several weeks.

EXAM:
CT MAXILLOFACIAL WITHOUT CONTRAST
TECHNIQUE: Multidetector CT images of the paranasal sinuses were obtained using
the standard protocol without intravenous contrast.

[Series 2: sinus 2.00 hr60 s3 ax · axial · 0.26mm/px · z∈[-823,-725]mm · 8 of 64 slices shown, 10 images]
[im 8/64  brain]
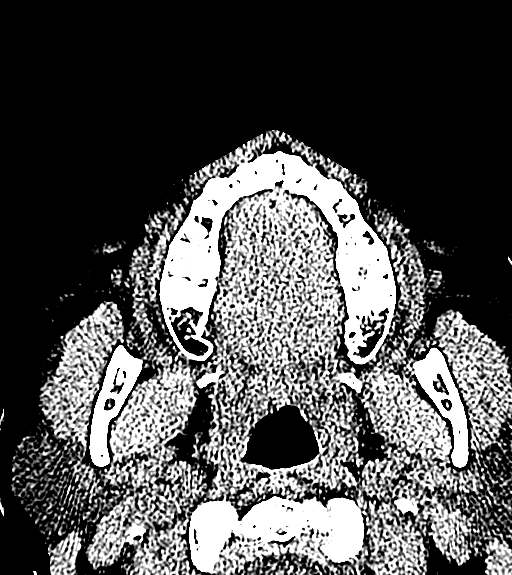
[im 8/64  bone]
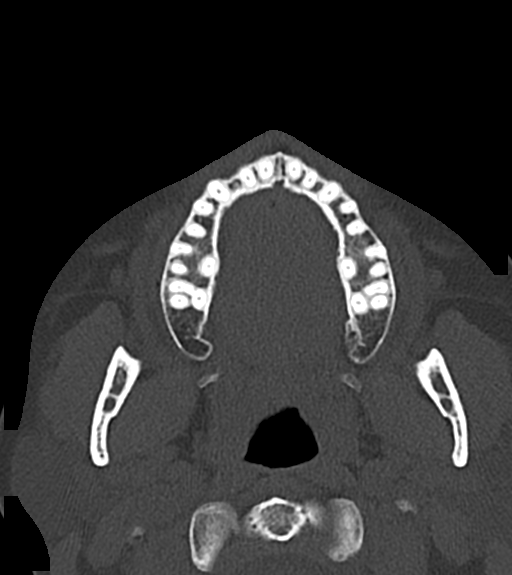
[im 15/64  bone]
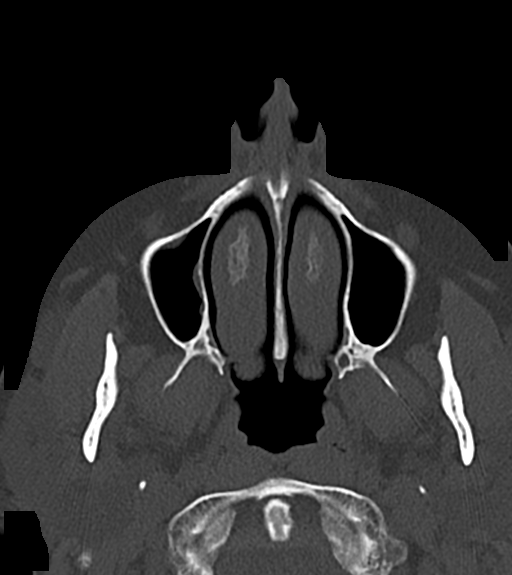
[im 22/64  bone]
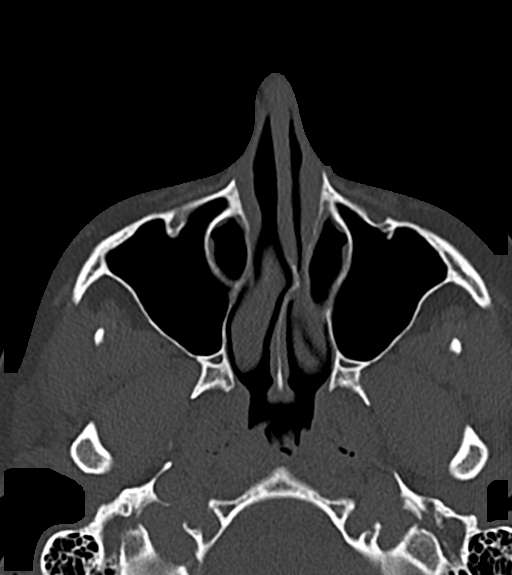
[im 29/64  bone]
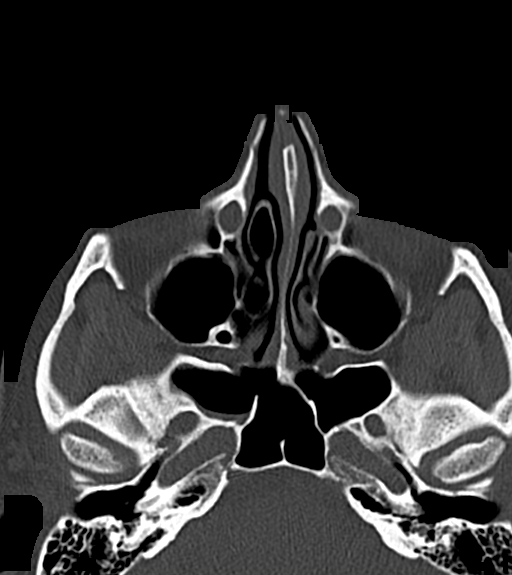
[im 36/64  brain]
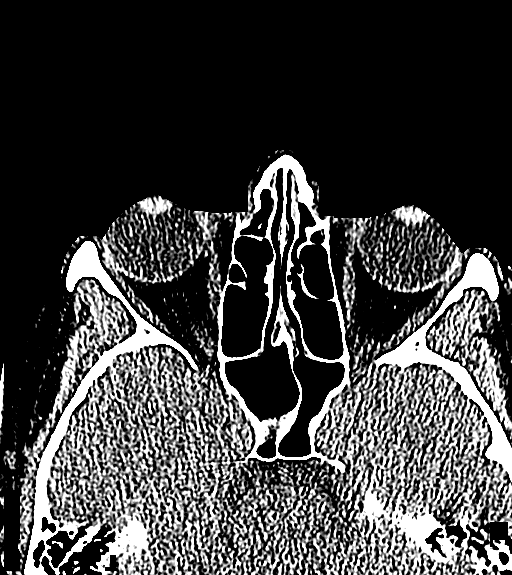
[im 36/64  bone]
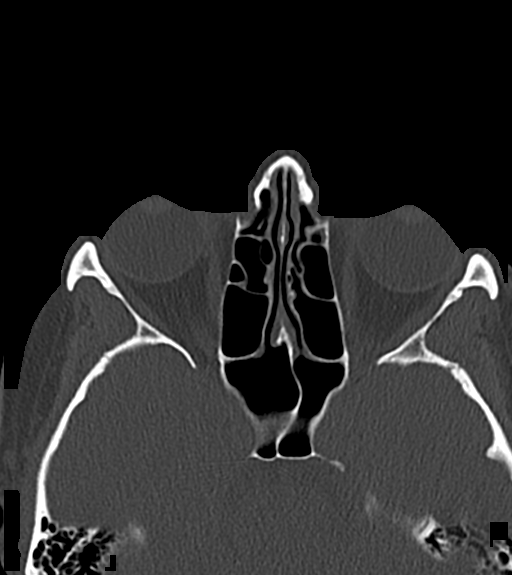
[im 43/64  bone]
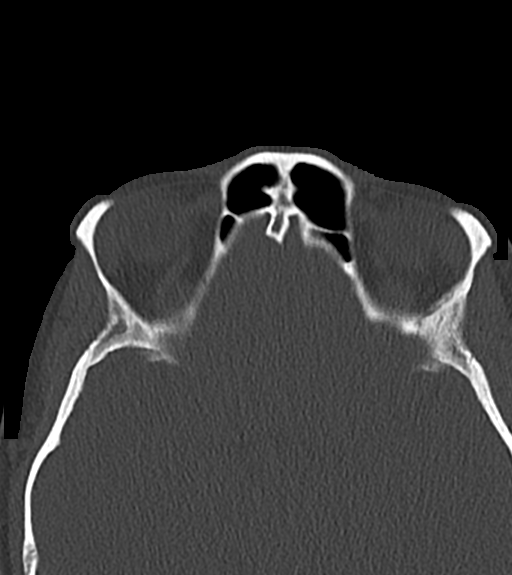
[im 50/64  bone]
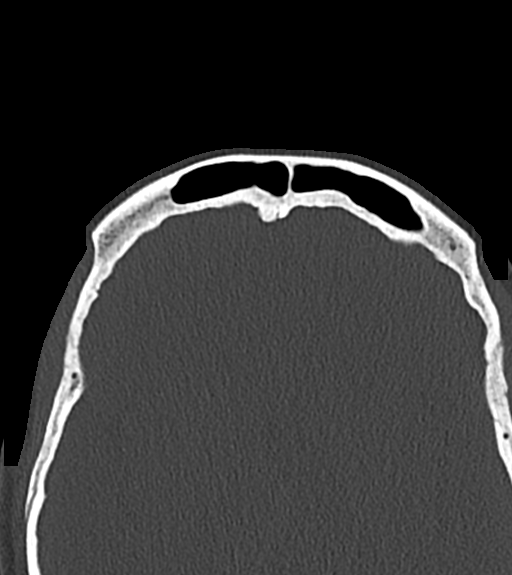
[im 57/64  bone]
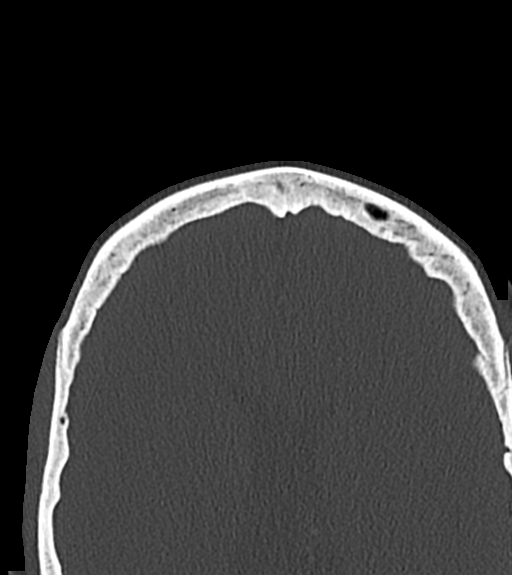

[Series 4: sinus 2.00 hr60 s3 cor · coronal · 0.25mm/px · 3 of 74 slices shown]
[im 25/74  bone]
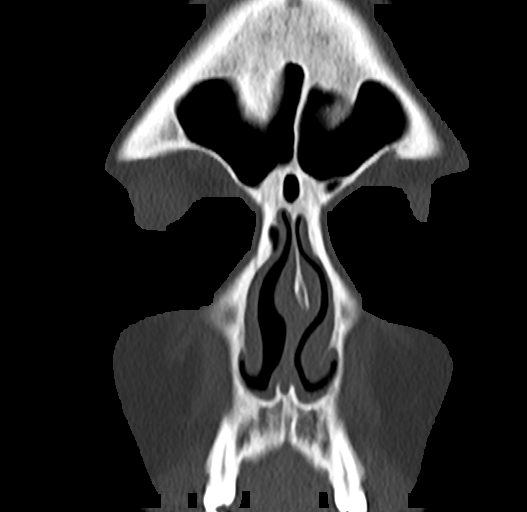
[im 33/74  bone]
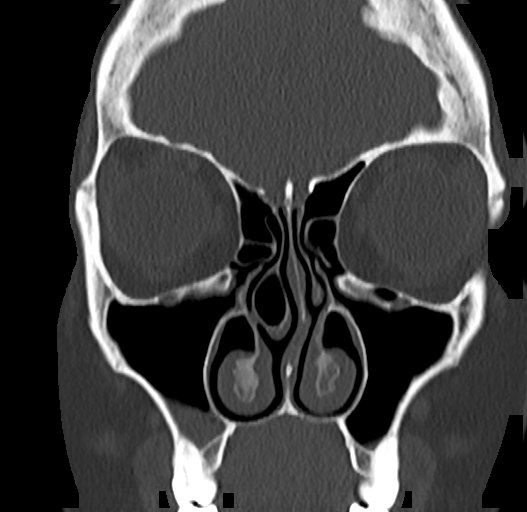
[im 41/74  bone]
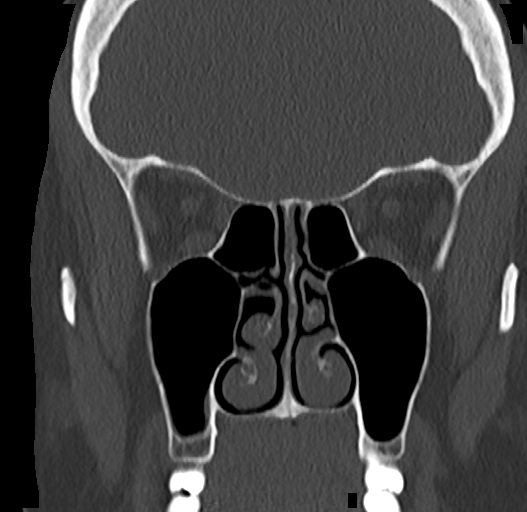

[Series 6: sinus 2.00 hr60 s3 sag · sagittal · 0.25mm/px · 3 of 66 slices shown]
[im 22/66  bone]
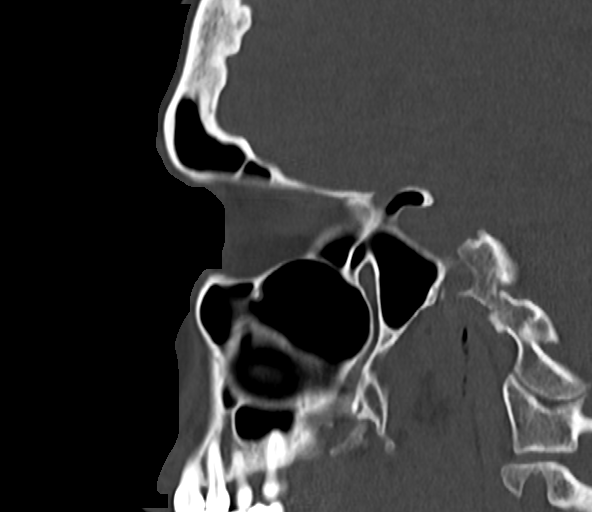
[im 33/66  bone]
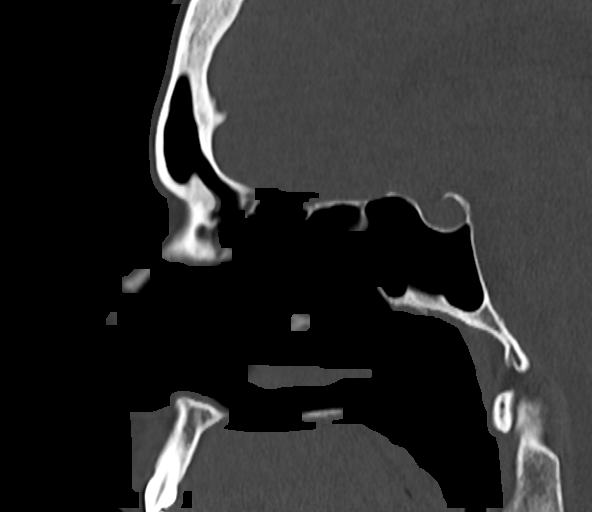
[im 44/66  bone]
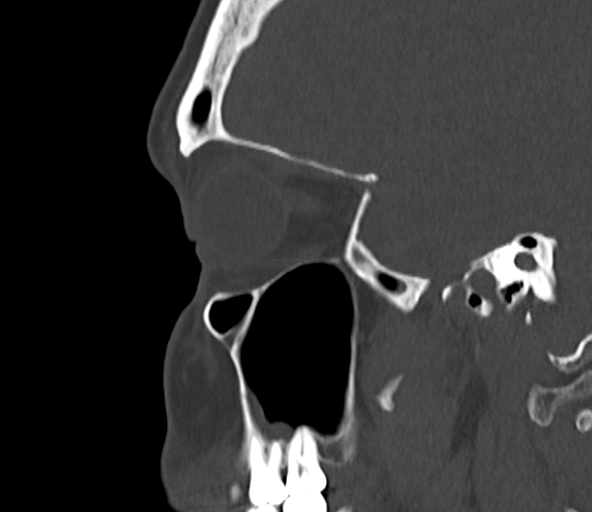

[Series 8: sinus 2.00 br40 s3 ax soft tissue · axial · 0.26mm/px · z∈[-822,-808]mm · 2 of 62 slices shown]
[im 7/62  brain]
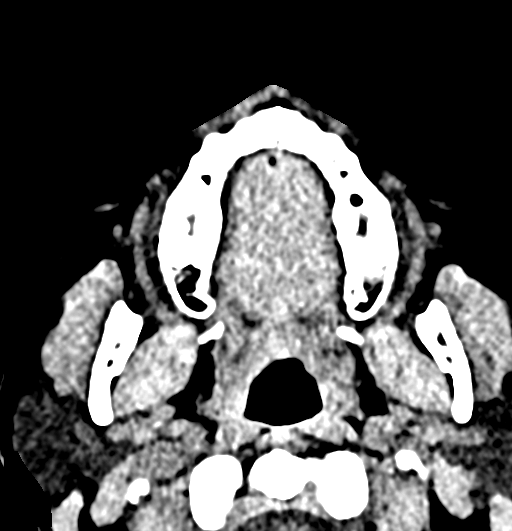
[im 14/62  brain]
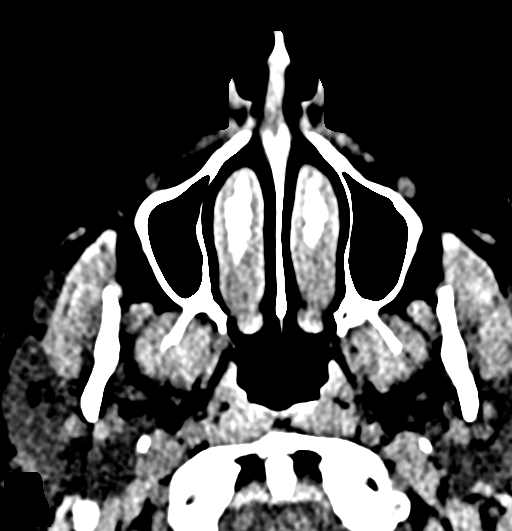

[16 of 47 positions shown; findings below may reference images not displayed]

FINDINGS: Paranasal sinuses:

Frontal: Normally aerated. Patent frontal sinus drainage pathways.

Ethmoid: Normally aerated.

Maxillary: Minimal mucosal thickening along the maxillary sinus
floors. No layering fluid.

Sphenoid: Minimal mucosal thickening along the floor of the right
division.

Right ostiomeatal unit: Infundibulum widely patent. Mild mucosal
thickening. Concha bullosa of the middle turbinate but without
significant encroachment.

Left ostiomeatal unit: Infundibulum widely patent.

Nasal passages: Patent. Nasal septum bows 3 mm towards the left.

Anatomy: No pneumatization superior to anterior ethmoid notches.
Symmetric and intact olfactory grooves and fovea ethmoidalis, Keros
II (4-7mm) Sellar sphenoid pneumatization pattern. No dehiscence of
carotid or optic canals. No onodi cell.

Other: None significant
IMPRESSION: No significant inflammatory changes presently. Mild/minimal mucosal
thickening along the maxillary sinus floors and along the floor of
the right division of the sphenoid sinus.

## 2019-03-03 DIAGNOSIS — M0589 Other rheumatoid arthritis with rheumatoid factor of multiple sites: Secondary | ICD-10-CM | POA: Diagnosis not present

## 2019-03-15 MED FILL — LEFLUNOMIDE 20 MG TABS: 20 | 30 days supply | Qty: 30 | Fill #0

## 2019-03-15 MED FILL — GABAPENTIN 300 MG CAPSULE: 300 | 30 days supply | Qty: 30 | Fill #1

## 2019-03-20 MED FILL — ETONOGESTREL-ETHINYL ESTRAD: 0.12-0.015 | 84 days supply | Qty: 3 | Fill #0

## 2019-04-01 IMAGING — US IR REPLACE PICC
1 series · 1 of 1 positions shown · non-contrast
Comparison: none

INDICATION: Rheumatoid arthritis

[Series 1: ir replace picc · 1 of 1 slices shown]
[im 1/1]
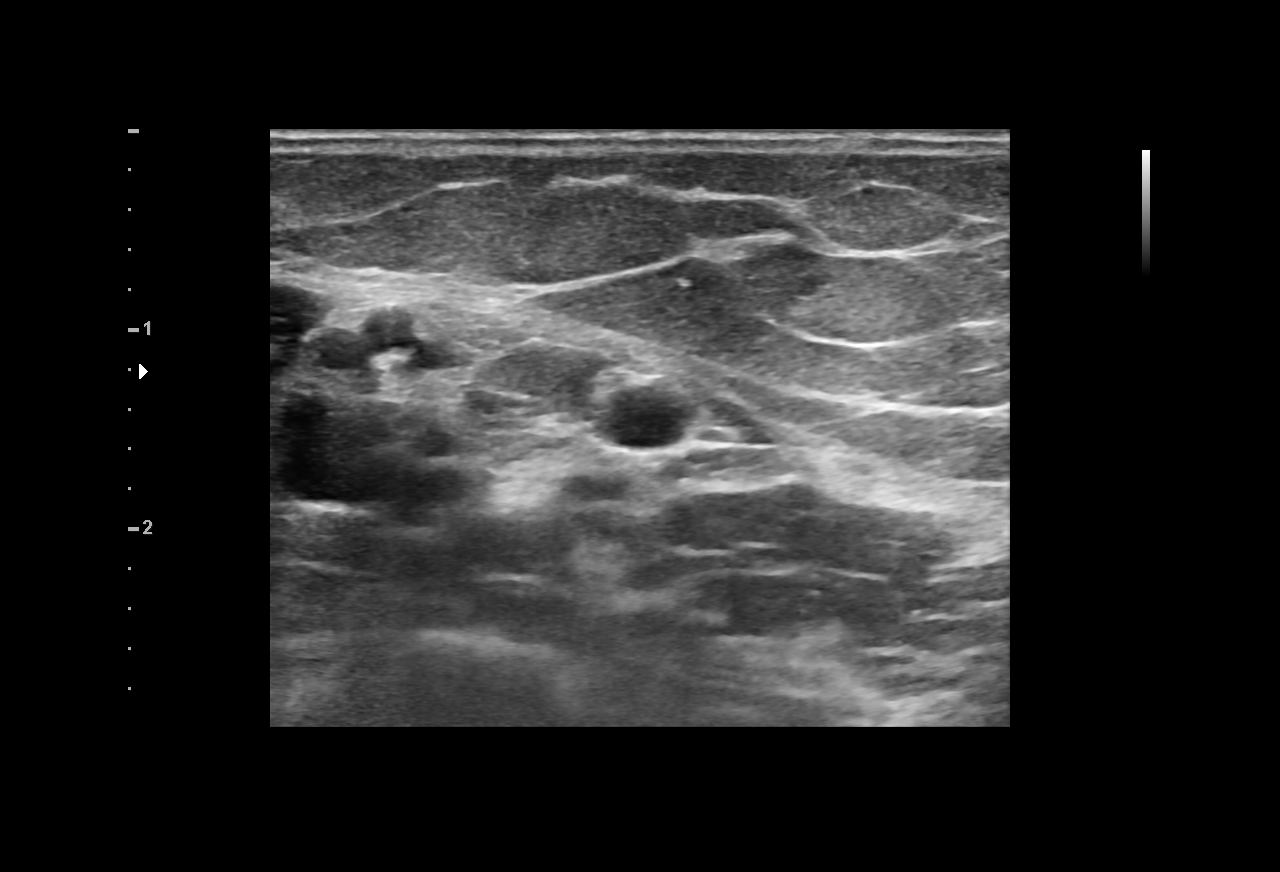

[1 of 1 positions shown; findings below may reference images not displayed]

EXAM:
PICC LINE PLACEMENT WITH ULTRASOUND AND FLUOROSCOPIC GUIDANCE

MEDICATIONS:
None

ANESTHESIA/SEDATION:
None

FLUOROSCOPY TIME:  Fluoroscopy Time:  minutes 18 seconds (1 mGy).

COMPLICATIONS:
None immediate.

PROCEDURE:
The patient was advised of the possible risks and complications and
agreed to undergo the procedure. The patient was then brought to the
angiographic suite for the procedure.

The right arm was prepped with chlorhexidine, draped in the usual
sterile fashion using maximum barrier technique (cap and mask,
sterile gown, sterile gloves, large sterile sheet, hand hygiene and
cutaneous antiseptic). Local anesthesia was attained by infiltration
with 1% lidocaine.

Ultrasound demonstrated patency of the basilic vein, and this was
documented with an image. Under real-time ultrasound guidance, this
vein was accessed with a 21 gauge micropuncture needle and image
documentation was performed. The needle was exchanged over a
guidewire for a peel-away sheath through which a 42 cm 5 French
single lumen power injectable PICC was advanced, and positioned with
its tip at the lower SVC/right atrial junction. Fluoroscopy during
the procedure and fluoro spot radiograph confirms appropriate
catheter position. The catheter was flushed, secured to the skin
with Prolene sutures, and covered with a sterile dressing.
IMPRESSION: Successful placement of a right arm PICC with sonographic and
fluoroscopic guidance. The catheter is ready for use.

## 2019-04-01 MED FILL — CLOBETASOL 0.05% GEL: 0.05 | 10 days supply | Qty: 30 | Fill #0

## 2019-04-09 ENCOUNTER — Other Ambulatory Visit: Payer: Self-pay

## 2019-04-09 ENCOUNTER — Ambulatory Visit: Payer: 59 | Admitting: Podiatry

## 2019-04-09 ENCOUNTER — Encounter: Payer: Self-pay | Admitting: Podiatry

## 2019-04-09 VITALS — Temp 96.6°F

## 2019-04-09 DIAGNOSIS — M722 Plantar fascial fibromatosis: Secondary | ICD-10-CM | POA: Diagnosis not present

## 2019-04-13 MED FILL — GABAPENTIN 300 MG CAPSULE: 300 | 30 days supply | Qty: 30 | Fill #0

## 2019-04-18 NOTE — Progress Notes (Signed)
Subjective:  [name], female    DOB: 08-Mar-1976,  MRN: [id number]  Chief Complaint  Patient presents with  . Plantar Fasciitis    Follow up right heel   "The heel pain has been really bad lately"    43 y.o. female presents with the above complaint.  Was doing well with the heel pain for a while but states that the heel pain is now hurting very much recently.  Was working from home for a while to the pandemic.  Now not able to tolerate bearing weight to the right foot.  Has been wearing her orthotics and still having pain to the foot  Review of Systems: Negative except as noted in the HPI. Denies N/V/F/Ch.  Past Medical History:  Diagnosis Date  . ALLERGIC RHINITIS   . ANXIETY DEPRESSION   . CONSTIPATION   . DEPRESSION   . Esophageal reflux   . HYPERCHOLESTEROLEMIA   . OBESITY   . Occipital neuralgia   . Palpitations   . Rheumatoid arthritis ([place]) 12/24/2017    Current Outpatient Medications:  .  ascorbic acid (VITAMIN C) 1000 MG tablet, Take 1,000 mg by mouth daily., Disp: , Rfl:  .  cetirizine (ZYRTEC) 10 MG tablet, Take 1 tablet (10 mg total) by mouth daily., Disp: 30 tablet, Rfl: 3 .  cholecalciferol (VITAMIN D) 1000 units tablet, Take 5,000 Units by mouth daily., Disp: , Rfl:  .  clobetasol ointment (TEMOVATE) 0.05 %, APPLY TO AFFECTED AREA TWICE DAILY FOR 10 DAYS, Disp: , Rfl: 0 .  escitalopram (LEXAPRO) 20 MG tablet, Take 1 tablet (20 mg total) by mouth daily., Disp: 90 tablet, Rfl: 1 .  esomeprazole (NEXIUM) 40 MG capsule, TAKE 1 CAPSULE BY MOUTH DAILY., Disp: 90 capsule, Rfl: 3 .  etonogestrel-ethinyl estradiol (NUVARING) 0.12-0.015 MG/24HR vaginal ring, CONTINUOUS USE. REMOVE AND INSERT NEW RING EVERY 4 WEEKS., Disp: 3 each, Rfl: 4 .  gabapentin (NEURONTIN) 300 MG capsule, , Disp: , Rfl:  .  Ibuprofen-Famotidine (DUEXIS) 800-26.6 MG TABS, Take 800 mg by mouth 3 (three) times daily., Disp: 90 tablet, Rfl: 3 .  Ibuprofen-Famotidine (DUEXIS) 800-26.6  MG TABS, Take 1 tablet by mouth 3 (three) times daily as needed., Disp: 90 tablet, Rfl: 3 .  leflunomide (ARAVA) 20 MG tablet, Take 20 mg by mouth daily., Disp: , Rfl:  .  montelukast (SINGULAIR) 10 MG tablet, TAKE 1 TABLET BY MOUTH AT BEDTIME., Disp: 90 tablet, Rfl: 3 .  Multiple Vitamin (MULTIVITAMIN) tablet, Take 1 tablet by mouth daily.  , Disp: , Rfl:  .  Omega-3 Fatty Acids (FISH OIL) 1000 MG CAPS, Take 2,000 mg by mouth 2 (two) times daily. , Disp: , Rfl:  .  Probiotic Product (SOLUBLE FIBER/PROBIOTICS PO), Take 2 capsules by mouth 2 (two) times daily., Disp: , Rfl:   Current Facility-Administered Medications:  .  ceFEPIme (MAXIPIME) 2 g in dextrose 5 % 50 mL IVPB, 2 g, Intravenous, Once, Dixon, Melton Krebs, NP  Social History   Tobacco Use  Smoking Status Never Smoker  Smokeless Tobacco Never Used  Tobacco Comment   Married, lives with spouse    Allergies  Allergen Reactions  . Ciprofloxacin     toxicity  . Levaquin [Levofloxacin] Anxiety    Possible CNS side effects. TOXICITY  . Moxifloxacin     TOXICITY   Objective:   Vitals:   04/09/19 1237  Temp: (!) 96.6 F (35.9 C)   There is no height or weight on file to calculate  BMI. Constitutional Well developed. Well nourished.  Vascular Dorsalis pedis pulses palpable bilaterally. Posterior tibial pulses palpable bilaterally. Capillary refill normal to all digits.  No cyanosis or clubbing noted. Pedal hair growth normal.  Neurologic Normal speech. Oriented to person, place, and time. Epicritic sensation to light touch grossly present bilaterally.  Dermatologic Nails well groomed and normal in appearance. No open wounds. No skin lesions.  Orthopedic: Normal joint ROM without pain or crepitus bilaterally. No visible deformities. Tender to palpation at the calcaneal tuber right. No pain with calcaneal squeeze right. Ankle ROM diminished range of motion right. Silfverskiold Test: positive right.   Radiographs:  None  Assessment:   1. Plantar fasciitis, right    Plan:  Patient was evaluated and treated and all questions answered.  Plantar Fasciitis, right -Final injection delivered as below -Discussed that if her pain is not improved we will consider possible surgical intervention versus proceeding with CPAP therapy  Procedure: Injection Tendon/Ligament Consent: Verbal consent obtained. Location: Right plantar fascia at the glabrous junction; medial approach. Skin Prep: Alcohol. Injectate: 1 cc 0.5% marcaine plain, 1 cc dexamethasone phosphate, 0.5 cc kenalog 10. Disposition: Patient tolerated procedure well. Injection site dressed with a band-aid.     Return in about 2 weeks (around 04/23/2019) for Plantar fasciitis f/u, discuss possible surgery vs EPAT .

## 2019-04-22 ENCOUNTER — Ambulatory Visit: Payer: 59 | Admitting: Podiatry

## 2019-04-22 ENCOUNTER — Telehealth: Payer: Self-pay | Admitting: *Deleted

## 2019-04-22 ENCOUNTER — Other Ambulatory Visit: Payer: Self-pay

## 2019-04-22 VITALS — Temp 97.5°F

## 2019-04-22 DIAGNOSIS — M722 Plantar fascial fibromatosis: Secondary | ICD-10-CM

## 2019-04-22 DIAGNOSIS — M216X9 Other acquired deformities of unspecified foot: Secondary | ICD-10-CM | POA: Diagnosis not present

## 2019-04-22 NOTE — Addendum Note (Signed)
Addended by: Harriett Sine D on: 04/22/2019 02:36 PM   Modules accepted: Orders

## 2019-04-22 NOTE — Progress Notes (Signed)
Subjective:  [name], female    DOB: 01/17/1976,  MRN: [id number]  Chief Complaint  Patient presents with  . Plantar Fasciitis    Pt states very mild improvement, has been wearing her brace, and states she is still having severe pain.    43 y.o. female presents with the above complaint.  Still having severe pain states injection did not sufficiently resolve the pain.  Would like to discuss further options  Review of Systems: Negative except as noted in the HPI. Denies N/V/F/Ch.  Past Medical History:  Diagnosis Date  . ALLERGIC RHINITIS   . ANXIETY DEPRESSION   . CONSTIPATION   . DEPRESSION   . Esophageal reflux   . HYPERCHOLESTEROLEMIA   . OBESITY   . Occipital neuralgia   . Palpitations   . Rheumatoid arthritis ([place]) 12/24/2017    Current Outpatient Medications:  .  ascorbic acid (VITAMIN C) 1000 MG tablet, Take 1,000 mg by mouth daily., Disp: , Rfl:  .  cetirizine (ZYRTEC) 10 MG tablet, Take 1 tablet (10 mg total) by mouth daily., Disp: 30 tablet, Rfl: 3 .  cholecalciferol (VITAMIN D) 1000 units tablet, Take 5,000 Units by mouth daily., Disp: , Rfl:  .  clobetasol ointment (TEMOVATE) 0.05 %, APPLY TO AFFECTED AREA TWICE DAILY FOR 10 DAYS, Disp: , Rfl: 0 .  escitalopram (LEXAPRO) 20 MG tablet, Take 1 tablet (20 mg total) by mouth daily., Disp: 90 tablet, Rfl: 1 .  esomeprazole (NEXIUM) 40 MG capsule, TAKE 1 CAPSULE BY MOUTH DAILY., Disp: 90 capsule, Rfl: 3 .  etonogestrel-ethinyl estradiol (NUVARING) 0.12-0.015 MG/24HR vaginal ring, CONTINUOUS USE. REMOVE AND INSERT NEW RING EVERY 4 WEEKS., Disp: 3 each, Rfl: 4 .  gabapentin (NEURONTIN) 300 MG capsule, , Disp: , Rfl:  .  Ibuprofen-Famotidine (DUEXIS) 800-26.6 MG TABS, Take 800 mg by mouth 3 (three) times daily., Disp: 90 tablet, Rfl: 3 .  Ibuprofen-Famotidine (DUEXIS) 800-26.6 MG TABS, Take 1 tablet by mouth 3 (three) times daily as needed., Disp: 90 tablet, Rfl: 3 .  leflunomide (ARAVA) 20 MG tablet,  Take 20 mg by mouth daily., Disp: , Rfl:  .  montelukast (SINGULAIR) 10 MG tablet, TAKE 1 TABLET BY MOUTH AT BEDTIME., Disp: 90 tablet, Rfl: 3 .  Multiple Vitamin (MULTIVITAMIN) tablet, Take 1 tablet by mouth daily.  , Disp: , Rfl:  .  Omega-3 Fatty Acids (FISH OIL) 1000 MG CAPS, Take 2,000 mg by mouth 2 (two) times daily. , Disp: , Rfl:  .  Probiotic Product (SOLUBLE FIBER/PROBIOTICS PO), Take 2 capsules by mouth 2 (two) times daily., Disp: , Rfl:   Current Facility-Administered Medications:  .  ceFEPIme (MAXIPIME) 2 g in dextrose 5 % 50 mL IVPB, 2 g, Intravenous, Once, Dixon, Melton Krebs, NP  Social History   Tobacco Use  Smoking Status Never Smoker  Smokeless Tobacco Never Used  Tobacco Comment   Married, lives with spouse    Allergies  Allergen Reactions  . Ciprofloxacin     toxicity  . Levaquin [Levofloxacin] Anxiety    Possible CNS side effects. TOXICITY  . Moxifloxacin     TOXICITY   Objective:   Vitals:   04/22/19 1004  Temp: (!) 97.5 F (36.4 C)   There is no height or weight on file to calculate BMI. Constitutional Well developed. Well nourished.  Vascular Dorsalis pedis pulses palpable bilaterally. Posterior tibial pulses palpable bilaterally. Capillary refill normal to all digits.  No cyanosis or clubbing noted. Pedal hair growth  normal.  Neurologic Normal speech. Oriented to person, place, and time. Epicritic sensation to light touch grossly present bilaterally.  Dermatologic Nails well groomed and normal in appearance. No open wounds. No skin lesions.  Orthopedic: Normal joint ROM without pain or crepitus bilaterally. No visible deformities. Tender to palpation at the calcaneal tuber right. No pain with calcaneal squeeze right. Ankle ROM diminished range of motion right. Silfverskiold Test: positive right.   Radiographs: None  Assessment:   1. Plantar fasciitis, right   2. Equinus deformity of foot    Plan:  Patient was evaluated and  treated and all questions answered.  Plantar Fasciitis, right -Discussed at this point that we have maximized injection therapy.  Discussed possible further treatments including physical therapy with possible dry needling.  Discussed possible EPAP therapy.  Discussed surgical invention consisting of plantar fasciotomy and gastrocnemius recession.  Patient would like to consider more conservative options prior to considering.  Will have patient trial 6 weeks physical therapy.  Advised that should her pain worsen she was further surgery to have her follow-up in the office.  20 minutes of face to face time were spent with the patient. >50% of this was spent on counseling and coordination of care. Specifically discussed with patient the above diagnoses and overall treatment plan.  No follow-ups on file.

## 2019-04-23 DIAGNOSIS — R21 Rash and other nonspecific skin eruption: Secondary | ICD-10-CM | POA: Diagnosis not present

## 2019-04-23 DIAGNOSIS — L409 Psoriasis, unspecified: Secondary | ICD-10-CM | POA: Diagnosis not present

## 2019-04-23 DIAGNOSIS — Z8709 Personal history of other diseases of the respiratory system: Secondary | ICD-10-CM | POA: Diagnosis not present

## 2019-04-23 DIAGNOSIS — K121 Other forms of stomatitis: Secondary | ICD-10-CM | POA: Diagnosis not present

## 2019-04-23 DIAGNOSIS — M0589 Other rheumatoid arthritis with rheumatoid factor of multiple sites: Secondary | ICD-10-CM | POA: Diagnosis not present

## 2019-04-23 DIAGNOSIS — Z7189 Other specified counseling: Secondary | ICD-10-CM | POA: Diagnosis not present

## 2019-04-23 DIAGNOSIS — M2550 Pain in unspecified joint: Secondary | ICD-10-CM | POA: Diagnosis not present

## 2019-04-23 DIAGNOSIS — L0291 Cutaneous abscess, unspecified: Secondary | ICD-10-CM | POA: Diagnosis not present

## 2019-04-27 MED FILL — LEFLUNOMIDE 20 MG TABLET: 20 | 30 days supply | Qty: 30 | Fill #0

## 2019-04-28 DIAGNOSIS — R269 Unspecified abnormalities of gait and mobility: Secondary | ICD-10-CM | POA: Diagnosis not present

## 2019-04-28 DIAGNOSIS — M25674 Stiffness of right foot, not elsewhere classified: Secondary | ICD-10-CM | POA: Diagnosis not present

## 2019-04-28 DIAGNOSIS — M79671 Pain in right foot: Secondary | ICD-10-CM | POA: Diagnosis not present

## 2019-04-28 DIAGNOSIS — M0589 Other rheumatoid arthritis with rheumatoid factor of multiple sites: Secondary | ICD-10-CM | POA: Diagnosis not present

## 2019-04-28 DIAGNOSIS — M62571 Muscle wasting and atrophy, not elsewhere classified, right ankle and foot: Secondary | ICD-10-CM | POA: Diagnosis not present

## 2019-04-28 DIAGNOSIS — M25671 Stiffness of right ankle, not elsewhere classified: Secondary | ICD-10-CM | POA: Diagnosis not present

## 2019-04-28 MED FILL — MONTELUKAST SOD 10 MG TAB: 10 | 90 days supply | Qty: 90 | Fill #3

## 2019-04-28 MED FILL — ESCITALOPRAM 20 MG TABLET: 20 | 90 days supply | Qty: 90 | Fill #1

## 2019-04-30 DIAGNOSIS — M62571 Muscle wasting and atrophy, not elsewhere classified, right ankle and foot: Secondary | ICD-10-CM | POA: Diagnosis not present

## 2019-04-30 DIAGNOSIS — M25674 Stiffness of right foot, not elsewhere classified: Secondary | ICD-10-CM | POA: Diagnosis not present

## 2019-04-30 DIAGNOSIS — R269 Unspecified abnormalities of gait and mobility: Secondary | ICD-10-CM | POA: Diagnosis not present

## 2019-04-30 DIAGNOSIS — M79671 Pain in right foot: Secondary | ICD-10-CM | POA: Diagnosis not present

## 2019-04-30 DIAGNOSIS — M25671 Stiffness of right ankle, not elsewhere classified: Secondary | ICD-10-CM | POA: Diagnosis not present

## 2019-05-03 DIAGNOSIS — M79671 Pain in right foot: Secondary | ICD-10-CM | POA: Diagnosis not present

## 2019-05-03 DIAGNOSIS — M62571 Muscle wasting and atrophy, not elsewhere classified, right ankle and foot: Secondary | ICD-10-CM | POA: Diagnosis not present

## 2019-05-03 DIAGNOSIS — R269 Unspecified abnormalities of gait and mobility: Secondary | ICD-10-CM | POA: Diagnosis not present

## 2019-05-03 DIAGNOSIS — M25674 Stiffness of right foot, not elsewhere classified: Secondary | ICD-10-CM | POA: Diagnosis not present

## 2019-05-03 DIAGNOSIS — M25671 Stiffness of right ankle, not elsewhere classified: Secondary | ICD-10-CM | POA: Diagnosis not present

## 2019-05-05 DIAGNOSIS — R269 Unspecified abnormalities of gait and mobility: Secondary | ICD-10-CM | POA: Diagnosis not present

## 2019-05-05 DIAGNOSIS — M62571 Muscle wasting and atrophy, not elsewhere classified, right ankle and foot: Secondary | ICD-10-CM | POA: Diagnosis not present

## 2019-05-05 DIAGNOSIS — M25674 Stiffness of right foot, not elsewhere classified: Secondary | ICD-10-CM | POA: Diagnosis not present

## 2019-05-05 DIAGNOSIS — M25671 Stiffness of right ankle, not elsewhere classified: Secondary | ICD-10-CM | POA: Diagnosis not present

## 2019-05-05 DIAGNOSIS — M79671 Pain in right foot: Secondary | ICD-10-CM | POA: Diagnosis not present

## 2019-05-07 DIAGNOSIS — R51 Headache: Secondary | ICD-10-CM | POA: Diagnosis not present

## 2019-05-07 DIAGNOSIS — G43719 Chronic migraine without aura, intractable, without status migrainosus: Secondary | ICD-10-CM | POA: Diagnosis not present

## 2019-05-07 MED FILL — GABAPENTIN 300 MG CAPSULE: 300 | 30 days supply | Qty: 30 | Fill #0

## 2019-05-11 DIAGNOSIS — R269 Unspecified abnormalities of gait and mobility: Secondary | ICD-10-CM | POA: Diagnosis not present

## 2019-05-11 DIAGNOSIS — M25674 Stiffness of right foot, not elsewhere classified: Secondary | ICD-10-CM | POA: Diagnosis not present

## 2019-05-11 DIAGNOSIS — M62571 Muscle wasting and atrophy, not elsewhere classified, right ankle and foot: Secondary | ICD-10-CM | POA: Diagnosis not present

## 2019-05-11 DIAGNOSIS — M79671 Pain in right foot: Secondary | ICD-10-CM | POA: Diagnosis not present

## 2019-05-11 DIAGNOSIS — M25671 Stiffness of right ankle, not elsewhere classified: Secondary | ICD-10-CM | POA: Diagnosis not present

## 2019-05-12 ENCOUNTER — Other Ambulatory Visit: Payer: Self-pay | Admitting: Internal Medicine

## 2019-05-12 MED FILL — ESOMEPRAZOLE MAG DR 40 MG C: 40 | 90 days supply | Qty: 90 | Fill #0

## 2019-05-14 DIAGNOSIS — M25674 Stiffness of right foot, not elsewhere classified: Secondary | ICD-10-CM | POA: Diagnosis not present

## 2019-05-14 DIAGNOSIS — R269 Unspecified abnormalities of gait and mobility: Secondary | ICD-10-CM | POA: Diagnosis not present

## 2019-05-14 DIAGNOSIS — M79671 Pain in right foot: Secondary | ICD-10-CM | POA: Diagnosis not present

## 2019-05-14 DIAGNOSIS — M25671 Stiffness of right ankle, not elsewhere classified: Secondary | ICD-10-CM | POA: Diagnosis not present

## 2019-05-14 DIAGNOSIS — M62571 Muscle wasting and atrophy, not elsewhere classified, right ankle and foot: Secondary | ICD-10-CM | POA: Diagnosis not present

## 2019-05-18 DIAGNOSIS — M79671 Pain in right foot: Secondary | ICD-10-CM | POA: Diagnosis not present

## 2019-05-18 DIAGNOSIS — M25674 Stiffness of right foot, not elsewhere classified: Secondary | ICD-10-CM | POA: Diagnosis not present

## 2019-05-18 DIAGNOSIS — M62571 Muscle wasting and atrophy, not elsewhere classified, right ankle and foot: Secondary | ICD-10-CM | POA: Diagnosis not present

## 2019-05-18 DIAGNOSIS — M25671 Stiffness of right ankle, not elsewhere classified: Secondary | ICD-10-CM | POA: Diagnosis not present

## 2019-05-18 DIAGNOSIS — R269 Unspecified abnormalities of gait and mobility: Secondary | ICD-10-CM | POA: Diagnosis not present

## 2019-05-20 DIAGNOSIS — M79671 Pain in right foot: Secondary | ICD-10-CM | POA: Diagnosis not present

## 2019-05-20 DIAGNOSIS — M25671 Stiffness of right ankle, not elsewhere classified: Secondary | ICD-10-CM | POA: Diagnosis not present

## 2019-05-20 DIAGNOSIS — M62571 Muscle wasting and atrophy, not elsewhere classified, right ankle and foot: Secondary | ICD-10-CM | POA: Diagnosis not present

## 2019-05-20 DIAGNOSIS — R269 Unspecified abnormalities of gait and mobility: Secondary | ICD-10-CM | POA: Diagnosis not present

## 2019-05-20 DIAGNOSIS — M25674 Stiffness of right foot, not elsewhere classified: Secondary | ICD-10-CM | POA: Diagnosis not present

## 2019-05-26 ENCOUNTER — Ambulatory Visit (INDEPENDENT_AMBULATORY_CARE_PROVIDER_SITE_OTHER): Payer: 59 | Admitting: Internal Medicine

## 2019-05-26 ENCOUNTER — Encounter: Payer: Self-pay | Admitting: Internal Medicine

## 2019-05-26 ENCOUNTER — Other Ambulatory Visit (INDEPENDENT_AMBULATORY_CARE_PROVIDER_SITE_OTHER): Payer: 59

## 2019-05-26 ENCOUNTER — Other Ambulatory Visit: Payer: Self-pay

## 2019-05-26 VITALS — BP 134/88 | HR 97 | Temp 98.3°F | Ht 70.0 in | Wt 253.0 lb

## 2019-05-26 DIAGNOSIS — Z0000 Encounter for general adult medical examination without abnormal findings: Secondary | ICD-10-CM

## 2019-05-26 DIAGNOSIS — R739 Hyperglycemia, unspecified: Secondary | ICD-10-CM

## 2019-05-26 LAB — LIPID PANEL
Cholesterol: 205 mg/dL — ABNORMAL HIGH (ref 0–200)
HDL: 47.5 mg/dL (ref >39.00)
NonHDL: 157.46
Total CHOL/HDL Ratio: 4
Triglycerides: 391 mg/dL — ABNORMAL HIGH (ref 0.0–149.0)
VLDL: 78.2 mg/dL — ABNORMAL HIGH (ref 0.0–40.0)

## 2019-05-26 LAB — URINALYSIS, ROUTINE W REFLEX MICROSCOPIC
Bilirubin Urine: NEGATIVE
Hgb urine dipstick: NEGATIVE
Ketones, ur: NEGATIVE
Leukocytes,Ua: NEGATIVE
Nitrite: NEGATIVE
RBC / HPF: NONE SEEN (ref 0–?)
Specific Gravity, Urine: 1.025 (ref 1.000–1.030)
Total Protein, Urine: NEGATIVE
Urine Glucose: NEGATIVE
Urobilinogen, UA: 0.2 (ref 0.0–1.0)
pH: 6 (ref 5.0–8.0)

## 2019-05-26 LAB — LDL CHOLESTEROL, DIRECT: Direct LDL: 96 mg/dL

## 2019-05-26 LAB — TSH: TSH: 1.28 u[IU]/mL (ref 0.35–4.50)

## 2019-05-26 LAB — HEMOGLOBIN A1C: Hgb A1c MFr Bld: 5.5 % (ref 4.6–6.5)

## 2019-05-26 MED ORDER — ESOMEPRAZOLE MAGNESIUM 40 MG PO CPDR: 40.0000 mg | DELAYED_RELEASE_CAPSULE | Freq: Every day | ORAL | 3 refills | Status: DC

## 2019-05-26 MED ORDER — ESCITALOPRAM OXALATE 20 MG PO TABS: 20.0000 mg | ORAL_TABLET | Freq: Every day | ORAL | 3 refills | Status: DC

## 2019-05-26 MED ORDER — MONTELUKAST SODIUM 10 MG PO TABS: 10.0000 mg | ORAL_TABLET | Freq: Every day | ORAL | 3 refills | Status: DC

## 2019-05-26 MED FILL — ESOMEPRAZOLE MAG DR 40 MG C: 40 | 90 days supply | Qty: 90 | Fill #0

## 2019-05-26 NOTE — Progress Notes (Signed)
Subjective:    [name], female    DOB: 1976/08/31, 43 y.o.   MRN: [id number]  HPI  Here for wellness and f/u;  Overall doing ok;  Pt denies Chest pain, worsening SOB, DOE, wheezing, orthopnea, PND, worsening LE edema, palpitations, dizziness or syncope.  Pt denies neurological change such as new headache, facial or extremity weakness.  Pt denies polydipsia, polyuria, or low sugar symptoms. Pt states overall good compliance with treatment and medications, good tolerability, and has been trying to follow appropriate diet.  Pt denies worsening depressive symptoms, suicidal ideation or panic. No fever, night sweats, wt loss, loss of appetite, or other constitutional symptoms.  Pt states good ability with ADL's, has low fall risk, home safety reviewed and adequate, no other significant changes in hearing or vision, and only occasionally active with exercise.  Has gained significant wt, has to have recurring episodes of prednisone. Wt Readings from Last 3 Encounters:  05/26/19 253 lb (114.8 kg)  01/28/19 251 lb (113.9 kg)  12/15/18 244 lb 14.9 oz (111.1 kg)  Stopped the simvastatin 2 yrs ago she had taken since 43yo after an episode of flouroquinolone reaction.  No new complaints Past Medical History:  Diagnosis Date  . ALLERGIC RHINITIS   . ANXIETY DEPRESSION   . CONSTIPATION   . DEPRESSION   . Esophageal reflux   . HYPERCHOLESTEROLEMIA   . OBESITY   . Occipital neuralgia   . Palpitations   . Rheumatoid arthritis ([place]) 12/24/2017   Past Surgical History:  Procedure Laterality Date  . NO PAST SURGERIES      reports that she has never smoked. She has never used smokeless tobacco. She reports that she does not drink alcohol or use drugs. family history includes Breast cancer (age of onset: 57) in her mother; Coronary artery disease in her father; Diabetes in her mother; Hyperlipidemia in her brother, father, and mother; Hypertension in her mother; Osteoarthritis in her  mother. Allergies  Allergen Reactions  . Ciprofloxacin     toxicity  . Levaquin [Levofloxacin] Anxiety    Possible CNS side effects. TOXICITY  . Moxifloxacin     TOXICITY   Current Outpatient Medications on File Prior to Visit  Medication Sig Dispense Refill  . ascorbic acid (VITAMIN C) 1000 MG tablet Take 1,000 mg by mouth daily.    . cetirizine (ZYRTEC) 10 MG tablet Take 1 tablet (10 mg total) by mouth daily. 30 tablet 3  . cholecalciferol (VITAMIN D) 1000 units tablet Take 5,000 Units by mouth daily.    . clobetasol ointment (TEMOVATE) 0.05 % APPLY TO AFFECTED AREA TWICE DAILY FOR 10 DAYS  0  . etonogestrel-ethinyl estradiol (NUVARING) 0.12-0.015 MG/24HR vaginal ring CONTINUOUS USE. REMOVE AND INSERT NEW RING EVERY 4 WEEKS. 3 each 4  . gabapentin (NEURONTIN) 300 MG capsule     . Ibuprofen-Famotidine (DUEXIS) 800-26.6 MG TABS Take 800 mg by mouth 3 (three) times daily. 90 tablet 3  . leflunomide (ARAVA) 20 MG tablet Take 20 mg by mouth daily.    . Multiple Vitamin (MULTIVITAMIN) tablet Take 1 tablet by mouth daily.      . Omega-3 Fatty Acids (FISH OIL) 1000 MG CAPS Take 2,000 mg by mouth 2 (two) times daily.     . Probiotic Product (SOLUBLE FIBER/PROBIOTICS PO) Take 2 capsules by mouth 2 (two) times daily.     No current facility-administered medications on file prior to visit.    Review of Systems Constitutional: Negative for other unusual  diaphoresis, sweats, appetite or weight changes HENT: Negative for other worsening hearing loss, ear pain, facial swelling, mouth sores or neck stiffness.   Eyes: Negative for other worsening pain, redness or other visual disturbance.  Respiratory: Negative for other stridor or swelling Cardiovascular: Negative for other palpitations or other chest pain  Gastrointestinal: Negative for worsening diarrhea or loose stools, blood in stool, distention or other pain Genitourinary: Negative for hematuria, flank pain or other change in urine volume.   Musculoskeletal: Negative for myalgias or other joint swelling.  Skin: Negative for other color change, or other wound or worsening drainage.  Neurological: Negative for other syncope or numbness. Hematological: Negative for other adenopathy or swelling Psychiatric/Behavioral: Negative for hallucinations, other worsening agitation, SI, self-injury, or new decreased concentration All other system neg per pt    Objective:   Physical Exam BP 134/88   Pulse 97   Temp 98.3 F (36.8 C) (Oral)   Ht 5\' 10"  (1.778 m)   Wt 253 lb (114.8 kg)   SpO2 96%   BMI 36.30 kg/m  VS noted,  Constitutional: Pt is oriented to person, place, and time. Appears well-developed and well-nourished, in no significant distress and comfortable Head: Normocephalic and atraumatic  Eyes: Conjunctivae and EOM are normal. Pupils are equal, round, and reactive to light Right Ear: External ear normal without discharge Left Ear: External ear normal without discharge Nose: Nose without discharge or deformity Mouth/Throat: Oropharynx is without other ulcerations and moist  Neck: Normal range of motion. Neck supple. No JVD present. No tracheal deviation present or significant neck LA or mass Cardiovascular: Normal rate, regular rhythm, normal heart sounds and intact distal pulses.   Pulmonary/Chest: WOB normal and breath sounds without rales or wheezing  Abdominal: Soft. Bowel sounds are normal. NT. No HSM  Musculoskeletal: Normal range of motion. Exhibits no edema Lymphadenopathy: Has no other cervical adenopathy.  Neurological: Pt is alert and oriented to person, place, and time. Pt has normal reflexes. No cranial nerve deficit. Motor grossly intact, Gait intact Skin: Skin is warm and dry. No rash noted or new ulcerations Psychiatric:  Has normal mood and affect. Behavior is normal without agitation No other exam findings Lab Results  Component Value Date   WBC 9.2 12/15/2018   HGB 13.0 12/15/2018   HCT 41.5  12/15/2018   PLT 463 (H) 12/15/2018   GLUCOSE 127 (H) 12/15/2018   CHOL 205 (H) 05/26/2019   TRIG 391.0 (H) 05/26/2019   HDL 47.50 05/26/2019   LDLDIRECT 96.0 05/26/2019   LDLCALC 110 (H) 05/22/2018   ALT 21 12/15/2018   AST 25 12/15/2018   NA 136 12/15/2018   K 3.5 12/15/2018   CL 106 12/15/2018   CREATININE 0.76 12/15/2018   BUN 11 12/15/2018   CO2 18 (L) 12/15/2018   TSH 1.28 05/26/2019   HGBA1C 5.5 05/26/2019      Assessment & Plan:

## 2019-05-26 NOTE — Patient Instructions (Signed)
Please continue all other medications as before, and refills have been done if requested.  Please have the pharmacy call with any other refills you may need.  Please continue your efforts at being more active, low cholesterol diet, and weight control.  You are otherwise up to date with prevention measures today.  Please keep your appointments with your specialists as you may have planned  Please go to the LAB in the Basement (turn left off the elevator) for the tests to be done today  You will be contacted by phone if any changes need to be made immediately.  Otherwise, you will receive a letter about your results with an explanation, but please check with MyChart first.  Please remember to sign up for MyChart if you have not done so, as this will be important to you in the future with finding out test results, communicating by private email, and scheduling acute appointments online when needed.  Please go to the LAB in the Basement (turn left off the elevator) for the A1c to be done at 6 months as well  Please return in 1 year for your yearly visit, or sooner if needed, with Lab testing done 3-5 days before

## 2019-05-28 NOTE — Telephone Encounter (Signed)
Entered in error

## 2019-05-29 ENCOUNTER — Encounter: Payer: Self-pay | Admitting: Internal Medicine

## 2019-05-31 MED FILL — LEFLUNOMIDE 20 MG TABLET: 20 | 30 days supply | Qty: 30 | Fill #1

## 2019-06-03 ENCOUNTER — Other Ambulatory Visit: Payer: Self-pay

## 2019-06-03 ENCOUNTER — Ambulatory Visit: Payer: 59 | Admitting: Podiatry

## 2019-06-03 VITALS — Temp 97.9°F

## 2019-06-03 DIAGNOSIS — M722 Plantar fascial fibromatosis: Secondary | ICD-10-CM

## 2019-06-03 DIAGNOSIS — M216X9 Other acquired deformities of unspecified foot: Secondary | ICD-10-CM

## 2019-06-04 ENCOUNTER — Encounter: Payer: Self-pay | Admitting: Internal Medicine

## 2019-06-04 DIAGNOSIS — R109 Unspecified abdominal pain: Secondary | ICD-10-CM

## 2019-06-07 MED FILL — ETONOGESTREL-ETHINYL ESTRAD: 0.12-0.015 | 84 days supply | Qty: 3 | Fill #1

## 2019-06-11 MED FILL — ETONOGESTREL-ETHINYL ESTRAD: 0.12-0.015 | 84 days supply | Qty: 3 | Fill #0

## 2019-06-15 ENCOUNTER — Encounter: Payer: Self-pay | Admitting: Internal Medicine

## 2019-06-21 NOTE — Progress Notes (Signed)
Subjective:  [name], female    DOB: 1976/03/25,  MRN: [id number]  Chief Complaint  Patient presents with  . Plantar Fasciitis    Pt states 4 weeks of physical therapy have been very beneficial, states her pain is reduced however it is still there and pronounced.    43 y.o. female presents with the above complaint.  History above confirmed with patient  Review of Systems: Negative except as noted in the HPI. Denies N/V/F/Ch.  Past Medical History:  Diagnosis Date  . ALLERGIC RHINITIS   . ANXIETY DEPRESSION   . CONSTIPATION   . DEPRESSION   . Esophageal reflux   . HYPERCHOLESTEROLEMIA   . OBESITY   . Occipital neuralgia   . Palpitations   . Rheumatoid arthritis ([place]) 12/24/2017    Current Outpatient Medications:  .  ascorbic acid (VITAMIN C) 1000 MG tablet, Take 1,000 mg by mouth daily., Disp: , Rfl:  .  cetirizine (ZYRTEC) 10 MG tablet, Take 1 tablet (10 mg total) by mouth daily., Disp: 30 tablet, Rfl: 3 .  cholecalciferol (VITAMIN D) 1000 units tablet, Take 5,000 Units by mouth daily., Disp: , Rfl:  .  clobetasol ointment (TEMOVATE) 0.05 %, APPLY TO AFFECTED AREA TWICE DAILY FOR 10 DAYS, Disp: , Rfl: 0 .  escitalopram (LEXAPRO) 20 MG tablet, Take 1 tablet (20 mg total) by mouth daily., Disp: 90 tablet, Rfl: 3 .  esomeprazole (NEXIUM) 40 MG capsule, Take 1 capsule (40 mg total) by mouth daily., Disp: 90 capsule, Rfl: 3 .  etonogestrel-ethinyl estradiol (NUVARING) 0.12-0.015 MG/24HR vaginal ring, CONTINUOUS USE. REMOVE AND INSERT NEW RING EVERY 4 WEEKS., Disp: 3 each, Rfl: 4 .  gabapentin (NEURONTIN) 300 MG capsule, , Disp: , Rfl:  .  Ibuprofen-Famotidine (DUEXIS) 800-26.6 MG TABS, Take 800 mg by mouth 3 (three) times daily., Disp: 90 tablet, Rfl: 3 .  leflunomide (ARAVA) 20 MG tablet, Take 20 mg by mouth daily., Disp: , Rfl:  .  montelukast (SINGULAIR) 10 MG tablet, Take 1 tablet (10 mg total) by mouth at bedtime., Disp: 90 tablet, Rfl: 3 .  Multiple  Vitamin (MULTIVITAMIN) tablet, Take 1 tablet by mouth daily.  , Disp: , Rfl:  .  Omega-3 Fatty Acids (FISH OIL) 1000 MG CAPS, Take 2,000 mg by mouth 2 (two) times daily. , Disp: , Rfl:  .  Probiotic Product (SOLUBLE FIBER/PROBIOTICS PO), Take 2 capsules by mouth 2 (two) times daily., Disp: , Rfl:   Social History   Tobacco Use  Smoking Status Never Smoker  Smokeless Tobacco Never Used  Tobacco Comment   Married, lives with spouse    Allergies  Allergen Reactions  . Ciprofloxacin     toxicity  . Levaquin [Levofloxacin] Anxiety    Possible CNS side effects. TOXICITY  . Moxifloxacin     TOXICITY   Objective:   Vitals:   06/03/19 0919  Temp: 97.9 F (36.6 C)   There is no height or weight on file to calculate BMI. Constitutional Well developed. Well nourished.  Vascular Dorsalis pedis pulses palpable bilaterally. Posterior tibial pulses palpable bilaterally. Capillary refill normal to all digits.  No cyanosis or clubbing noted. Pedal hair growth normal.  Neurologic Normal speech. Oriented to person, place, and time. Epicritic sensation to light touch grossly present bilaterally.  Dermatologic Nails well groomed and normal in appearance. No open wounds. No skin lesions.  Orthopedic: Normal joint ROM without pain or crepitus bilaterally. No visible deformities. Tender to palpation at the calcaneal  tuber right. No pain with calcaneal squeeze right. Ankle ROM diminished range of motion right. Silfverskiold Test: positive right.   Radiographs: None  Assessment:   1. Plantar fasciitis, right   2. Equinus deformity of foot    Plan:  Patient was evaluated and treated and all questions answered.  Plantar Fasciitis, right -States she is doing better therapy is helping her pain is reduced but she is still having some.  She is not ready for surgery.  We discussed other alternatives including shockwave or EPAT or prolotherapy.  Patient wishes to think these are and wishes  to follow-up in a few weeks to discuss.  Ideally we can refill her meloxicam as needed should her pain persist.  Follow-up in 6 weeks for further discussion possible surgical planning  15 minutes of face to face time were spent with the patient. >50% of this was spent on counseling and coordination of care. Specifically discussed with patient the above diagnoses and overall treatment plan.   Return in about 6 weeks (around 07/15/2019).

## 2019-06-23 ENCOUNTER — Encounter: Payer: Self-pay | Admitting: Internal Medicine

## 2019-06-23 DIAGNOSIS — M0589 Other rheumatoid arthritis with rheumatoid factor of multiple sites: Secondary | ICD-10-CM | POA: Diagnosis not present

## 2019-06-23 DIAGNOSIS — E785 Hyperlipidemia, unspecified: Secondary | ICD-10-CM

## 2019-06-23 DIAGNOSIS — Z79899 Other long term (current) drug therapy: Secondary | ICD-10-CM | POA: Diagnosis not present

## 2019-06-24 MED ORDER — FENOFIBRATE 160 MG PO TABS: 160.0000 mg | ORAL_TABLET | Freq: Every day | ORAL | 3 refills | Status: DC

## 2019-06-24 MED FILL — FENOFIBRATE 160 MG TABLET: 160 | 90 days supply | Qty: 90 | Fill #0

## 2019-06-30 MED FILL — GABAPENTIN 300 MG CAPSULE: 300 | 30 days supply | Qty: 30 | Fill #1

## 2019-06-30 MED FILL — LEFLUNOMIDE 20 MG TABLET: 20 | 30 days supply | Qty: 30 | Fill #2

## 2019-07-07 ENCOUNTER — Ambulatory Visit: Payer: 59 | Admitting: Physician Assistant

## 2019-07-07 ENCOUNTER — Encounter: Payer: Self-pay | Admitting: Physician Assistant

## 2019-07-07 VITALS — BP 154/90 | HR 100 | Temp 98.6°F | Ht 70.5 in | Wt 251.2 lb

## 2019-07-07 DIAGNOSIS — R112 Nausea with vomiting, unspecified: Secondary | ICD-10-CM | POA: Diagnosis not present

## 2019-07-07 DIAGNOSIS — K219 Gastro-esophageal reflux disease without esophagitis: Secondary | ICD-10-CM | POA: Diagnosis not present

## 2019-07-07 DIAGNOSIS — R1013 Epigastric pain: Secondary | ICD-10-CM

## 2019-07-07 MED ORDER — FAMOTIDINE 40 MG PO TABS: 40.0000 mg | ORAL_TABLET | Freq: Every evening | ORAL | 3 refills | Status: DC

## 2019-07-07 MED FILL — FAMOTIDINE 40 MG TABLET: 40 | 30 days supply | Qty: 30 | Fill #0

## 2019-07-07 NOTE — Patient Instructions (Signed)
If you are age 42 or older, your body mass index should be between 23-30. Your Body mass index is 35.54 kg/m. If this is out of the aforementioned range listed, please consider follow up with your Primary Care Provider.  If you are age 51 or younger, your body mass index should be between 19-25. Your Body mass index is 35.54 kg/m. If this is out of the aformentioned range listed, please consider follow up with your Primary Care Provider.   You have been scheduled for an endoscopy. Please follow written instructions given to you at your visit today. If you use inhalers (even only as needed), please bring them with you on the day of your procedure. Your physician has requested that you go to www.startemmi.com and enter the access code given to you at your visit today. This web site gives a general overview about your procedure. However, you should still follow specific instructions given to you by our office regarding your preparation for the procedure.  We have sent the following medications to your pharmacy for you to pick up at your convenience: Famotidine 40 mg  Continue Nexium 40 mg every morning.  Thank you for choosing me and [facility].   Amy Esterwood, PA-C

## 2019-07-07 NOTE — Progress Notes (Signed)
Subjective:    [name], female    DOB: 1976/01/05, 43 y.o.   MRN: [id number]  HPI April [name] is a pleasant 43 year old white female, new to GI today referred by Dr. Cathlean Cower for evaluation of progressive GERD.  Patient known remotely to Dr. Deatra Ina and had endoscopic evaluation in 2003. Patient has history of rheumatoid arthritis for which she is now on Simponi, obesity and anxiety/depression. She relates that she has long history of GERD dating back to her childhood.  She was initially treated with H2 blockers and then eventually progressed to PPI therapy.  She says she has been on a PPI over the past 15 years or so.  She did have a EGD in 2003 per Dr. Deatra Ina which was normal exam. She relates that Nexium 40 mg p.o. daily had generally controlled her symptoms fairly well but over the past couple of months has had increase in acid reflux symptoms heartburn and indigestion as well as onset of epigastric pain and nausea.  She says generally symptoms are exacerbated by eating and her reflux worsens after dinner as well.  She has added OTC famotidine at bedtime which has been helpful.  However she is still having some problems with nausea and at times is having epigastric pain which she says "burns like fire".  She denies any dysphagia or odynophagia.  She is not taking any regular NSAIDs.  She does have a prescription for Duexis that she has she only takes this rarely. No other new medications.  It also noted some recent changes in her bowel habits but decreased her probiotic and is now having more regular bowel movements.  Review of Systems.Pertinent positive and negative review of systems were noted in the above HPI section.  All other review of systems was otherwise negative.  Outpatient Encounter Medications as of 07/07/2019  Medication Sig  . ascorbic acid (VITAMIN C) 1000 MG tablet Take 1,000 mg by mouth daily.  . cetirizine (ZYRTEC) 10 MG tablet Take 1 tablet (10 mg total)  by mouth daily.  . Cholecalciferol (VITAMIN D3) 125 MCG (5000 UT) CAPS Take 1 capsule by mouth daily.  . clobetasol ointment (TEMOVATE) 0.05 % APPLY TO AFFECTED AREA TWICE DAILY FOR 10 DAYS  . escitalopram (LEXAPRO) 20 MG tablet Take 1 tablet (20 mg total) by mouth daily.  Marland Kitchen esomeprazole (NEXIUM) 40 MG capsule Take 1 capsule (40 mg total) by mouth daily.  Marland Kitchen etonogestrel-ethinyl estradiol (NUVARING) 0.12-0.015 MG/24HR vaginal ring CONTINUOUS USE. REMOVE AND INSERT NEW RING EVERY 4 WEEKS.  . fenofibrate 160 MG tablet Take 1 tablet (160 mg total) by mouth daily.  Marland Kitchen gabapentin (NEURONTIN) 300 MG capsule Take 300 mg by mouth at bedtime.   . Ibuprofen-Famotidine (DUEXIS) 800-26.6 MG TABS Take 800 mg by mouth 3 (three) times daily.  Marland Kitchen leflunomide (ARAVA) 20 MG tablet Take 20 mg by mouth daily.  Marland Kitchen MAGNESIUM MALATE PO Take 1 tablet by mouth at bedtime.  . montelukast (SINGULAIR) 10 MG tablet Take 1 tablet (10 mg total) by mouth at bedtime.  . Multiple Vitamin (MULTIVITAMIN) tablet Take 1 tablet by mouth daily.    . Omega-3 Fatty Acids (FISH OIL) 1000 MG CAPS Take 2 capsules by mouth 2 (two) times daily.   . Probiotic Product (SOLUBLE FIBER/PROBIOTICS PO) Take 1 capsule by mouth daily.   . Turmeric 500 MG TABS Take 1 tablet by mouth 2 (two) times daily.  . [DISCONTINUED] famotidine (PEPCID) 20 MG tablet Take 20 mg by mouth  at bedtime.  . famotidine (PEPCID) 40 MG tablet Take 1 tablet (40 mg total) by mouth every evening.  . [DISCONTINUED] cholecalciferol (VITAMIN D) 1000 units tablet Take 5,000 Units by mouth daily.   No facility-administered encounter medications on file as of 07/07/2019.    Allergies  Allergen Reactions  . Ciprofloxacin     toxicity  . Levaquin [Levofloxacin] Anxiety    Possible CNS side effects. TOXICITY  . Moxifloxacin     TOXICITY   Patient Active Problem List   Diagnosis Date Noted  . Tachycardia 12/15/2018  . Other chest pain 12/15/2018  . DOE (dyspnea on exertion)  12/15/2018  . Nonallopathic lesion of lumbosacral region 06/08/2018  . Nonallopathic lesion of sacral region 06/08/2018  . PICC (peripherally inserted central catheter) in place 04/19/2018  . 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 Acute nasopharyngitis 02/20/2018  . Allergic rhinitis due to animal hair and dander 02/20/2018  . Non-recurrent acute serous otitis media of right ear 02/20/2018  . Rheumatoid arthritis ([place]) 12/24/2017  . Trigger point of shoulder region, right 12/09/2017  . Lumbar paraspinal muscle spasm 11/13/2017  . Cervical radicular pain 09/18/2017  . Poor posture 08/14/2017  . Eustachian tube dysfunction 01/07/2017  . Chronic sinusitis 01/07/2017  . Vertigo 12/31/2016  . 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 Nonallopathic lesion of thoracic region 06/03/2016  . Nonallopathic lesion-rib cage 06/03/2016  . Nonallopathic lesion of cervical region 06/03/2016  . Folliculitis due to Pseudomonas aeruginosa 05/22/2016  . Acute left-sided thoracic back pain 05/22/2016  . Hyperglycemia 05/03/2016  . Preventative health care 05/04/2015  . Left hamstring injury 01/24/2015  . Major depressive disorder, recurrent episode, moderate ([place]) 12/02/2014  . Occipital neuralgia   . ANEMIA-NOS 12/11/2009  . Hyperlipidemia 11/16/2008  . Obesity 11/16/2008  . ANXIETY DEPRESSION 11/16/2008  . Allergic rhinitis 11/16/2008  . Esophageal reflux 11/16/2008  . Constipation 11/16/2008  . Palpitations 11/16/2008   Social History   Socioeconomic History  . Marital status: Married    Spouse name: Not on file  . Number of children: 0  . Years of education: Not on file  . Highest education level: Not on file  Occupational History  . Occupation: Software engineer  Social Needs  . Financial resource strain: Not on file  . Food insecurity    Worry: Not on file    Inability: Not on file  . Transportation needs    Medical: Not on file    Non-medical: Not on file   Tobacco Use  . Smoking status: Never Smoker  . Smokeless tobacco: Never Used  . Tobacco comment: Married, lives with spouse  Substance and Sexual Activity  . Alcohol use: No  . Drug use: No  . Sexual activity: Yes    Partners: Male    Birth control/protection: Inserts    Comment: 1ST INTERCOURSE- 21, PARTNERS- 4   Lifestyle  . Physical activity    Days per week: Not on file    Minutes per session: Not on file  . Stress: Not on file  Relationships  . Social Herbalist on phone: Not on file    Gets together: Not on file    Attends religious service: Not on file    Active member of club or organization: Not on file    Attends meetings of clubs or organizations: Not on file    Relationship status: Not on file  . Intimate partner violence    Fear of current or ex  partner: Not on file    Emotionally abused: Not on file    Physically abused: Not on file    Forced sexual activity: Not on file  Other Topics Concern  . Not on file  Social History Narrative  . Not on file    April [name]'s family history includes Breast cancer (age of onset: 50) in her mother; Colon polyps in her father and mother; Coronary artery disease in her father; Diabetes in her mother; Hyperlipidemia in her brother, father, and mother; Hypertension in her mother; Osteoarthritis in her mother; Prostate cancer in her father; Stomach cancer in her paternal grandmother.      Objective:    Vitals:   07/07/19 1508  BP: (!) 154/90  Pulse: 100  Temp: 98.6 F (37 C)    Physical Exam Well-developed well-nourished white female in no acute distress.  Pleasant height, Weight 251, BMI 35.5  HEENT; nontraumatic normocephalic, EOMI, PE R LA, sclera anicteric. Oropharynx; not examined/wearing mass/COVID Neck; supple, no JVD Cardiovascular; regular rate and rhythm with S1-S2, no murmur rub or gallop Pulmonary; Clear bilaterally Abdomen; soft, mildly tender in the epigastrium, nondistended, no palpable  mass or hepatosplenomegaly, bowel sounds are active Rectal; not done today Skin; benign exam, no jaundice rash or appreciable lesions Extremities; no clubbing cyanosis or edema skin warm and dry Neuro/Psych; alert and oriented x4, grossly nonfocal mood and affect appropriate       Assessment & Plan:   #62 43 year old female with long history of chronic GERD maintained on Nexium 40 mg daily with 2 to 11-month history of increase in heartburn indigestion and acid reflux as well as nausea, postprandial abdominal discomfort, epigastric burning. She has had some improvement in symptoms with addition of famotidine at bedtime. No regular NSAID use Rule out gastritis, peptic ulcer disease, reflux esophagitis, rule out H. pylori gastropathy Doubt gallbladder disease but cannot rule out.  #2 rheumatoid arthritis on Simponi #3 anxiety/depression  Plan; tinea Nexium 40 mg p.o. every morning Add famotidine 40 mg p.o. with evening meal, prescription sent Tighten antireflux regimen Avoid all NSAIDs Patient will be scheduled for upper endoscopy with Dr. Ardis Hughs.  Procedure was discussed in detail with the patient including indications risks and benefits and she is agreeable to proceed. If EGD is unremarkable and symptoms are persisting will need upper abdominal ultrasound.  Danean Marner Genia Harold PA-C 07/07/2019   Cc: Biagio Borg, MD

## 2019-07-08 NOTE — Progress Notes (Signed)
I agree with the above note, plan 

## 2019-07-14 DIAGNOSIS — L089 Local infection of the skin and subcutaneous tissue, unspecified: Secondary | ICD-10-CM | POA: Diagnosis not present

## 2019-07-14 DIAGNOSIS — L409 Psoriasis, unspecified: Secondary | ICD-10-CM | POA: Diagnosis not present

## 2019-07-14 DIAGNOSIS — L080 Pyoderma: Secondary | ICD-10-CM | POA: Diagnosis not present

## 2019-07-14 MED FILL — METHOTREXATE 25 MG/ML VIAL: 50 | 90 days supply | Qty: 8 | Fill #0

## 2019-07-14 MED FILL — FOLIC ACID 1 MG TABS: 1 | 90 days supply | Qty: 90 | Fill #0

## 2019-07-15 ENCOUNTER — Ambulatory Visit: Payer: 59 | Admitting: Podiatry

## 2019-07-19 ENCOUNTER — Encounter: Payer: Self-pay | Admitting: Gastroenterology

## 2019-07-22 ENCOUNTER — Telehealth: Payer: Self-pay

## 2019-07-22 NOTE — Telephone Encounter (Signed)
Covid-19 screening questions   Do you now or have you had a fever in the last 14 days?  Do you have any respiratory symptoms of shortness of breath or cough now or in the last 14 days?  Do you have any family members or close contacts with diagnosed or suspected Covid-19 in the past 14 days?  Have you been tested for Covid-19 and found to be positive?       

## 2019-07-23 ENCOUNTER — Other Ambulatory Visit: Payer: Self-pay

## 2019-07-23 ENCOUNTER — Encounter: Payer: Self-pay | Admitting: Gastroenterology

## 2019-07-23 ENCOUNTER — Ambulatory Visit (AMBULATORY_SURGERY_CENTER): Payer: 59 | Admitting: Gastroenterology

## 2019-07-23 VITALS — BP 157/91 | HR 84 | Temp 98.1°F | Resp 18 | Ht 70.0 in | Wt 251.0 lb

## 2019-07-23 DIAGNOSIS — K2970 Gastritis, unspecified, without bleeding: Secondary | ICD-10-CM

## 2019-07-23 DIAGNOSIS — M069 Rheumatoid arthritis, unspecified: Secondary | ICD-10-CM | POA: Diagnosis not present

## 2019-07-23 DIAGNOSIS — R112 Nausea with vomiting, unspecified: Secondary | ICD-10-CM | POA: Diagnosis not present

## 2019-07-23 DIAGNOSIS — R1013 Epigastric pain: Secondary | ICD-10-CM | POA: Diagnosis not present

## 2019-07-23 DIAGNOSIS — K219 Gastro-esophageal reflux disease without esophagitis: Secondary | ICD-10-CM

## 2019-07-23 DIAGNOSIS — K2950 Unspecified chronic gastritis without bleeding: Secondary | ICD-10-CM | POA: Diagnosis not present

## 2019-07-23 MED ORDER — SODIUM CHLORIDE 0.9 % IV SOLN: 500.0000 mL | Freq: Once | INTRAVENOUS | Status: DC

## 2019-07-23 NOTE — Progress Notes (Signed)
Called to room to assist during endoscopic procedure.  Patient ID and intended procedure confirmed with present staff. Received instructions for my participation in the procedure from the performing physician.  

## 2019-07-23 NOTE — Progress Notes (Signed)
Report to PACU, RN, vss, BBS= Clear.  

## 2019-07-23 NOTE — Patient Instructions (Signed)
Thank you for allowing [place] to care for you today!  Await pathology results by mail, approximately 2 weeks.    Continue present medications including Nexium before breakfast and Pepcid at bedtime.  Resume previous diet today. Return to your normal activities tomorrow.     YOU HAD AN ENDOSCOPIC PROCEDURE TODAY AT [facility]:   Refer to the procedure report that was given to you for any specific questions about what was found during the examination.  If the procedure report does not answer your questions, please call your gastroenterologist to clarify.  If you requested that your care partner not be given the details of your procedure findings, then the procedure report has been included in a sealed envelope for you to review at your convenience later.  YOU SHOULD EXPECT: Some feelings of bloating in the abdomen. Passage of more gas than usual.  Walking can help get rid of the air that was put into your GI tract during the procedure and reduce the bloating. If you had a lower endoscopy (such as a colonoscopy or flexible sigmoidoscopy) you may notice spotting of blood in your stool or on the toilet paper. If you underwent a bowel prep for your procedure, you may not have a normal bowel movement for a few days.  Please Note:  You might notice some irritation and congestion in your nose or some drainage.  This is from the oxygen used during your procedure.  There is no need for concern and it should clear up in a day or so.  SYMPTOMS TO REPORT IMMEDIATELY:    Following upper endoscopy (EGD)  Vomiting of blood or coffee ground material  New chest pain or pain under the shoulder blades  Painful or persistently difficult swallowing  New shortness of breath  Fever of 100F or higher  Black, tarry-looking stools  For urgent or emergent issues, a gastroenterologist can be reached at any hour by calling [phone].   DIET:  We do recommend a small meal at first, but then you may  proceed to your regular diet.  Drink plenty of fluids but you should avoid alcoholic beverages for 24 hours.  ACTIVITY:  You should plan to take it easy for the rest of today and you should NOT DRIVE or use heavy machinery until tomorrow (because of the sedation medicines used during the test).    FOLLOW UP: Our staff will call the number listed on your records 48-72 hours following your procedure to check on you and address any questions or concerns that you may have regarding the information given to you following your procedure. If we do not reach you, we will leave a message.  We will attempt to reach you two times.  During this call, we will ask if you have developed any symptoms of COVID 19. If you develop any symptoms (ie: fever, flu-like symptoms, shortness of breath, cough etc.) before then, please call [phone].  If you test positive for Covid 19 in the 2 weeks post procedure, please call and report this information to [place].    If any biopsies were taken you will be contacted by phone or by letter within the next 1-3 weeks.  Please call us at [phone] if you have not heard about the biopsies in 3 weeks.    SIGNATURES/CONFIDENTIALITY: You and/or your care partner have signed paperwork which will be entered into your electronic medical record.  These signatures attest to the fact that that the information above on  your After Visit Summary has been reviewed and is understood.  Full responsibility of the confidentiality of this discharge information lies with you and/or your care-partner. 

## 2019-07-23 NOTE — Op Note (Signed)
[place] Patient Name: April [name] Procedure Date: 07/23/2019 1:05 PM MRN: [id number] Endoscopist: Milus Banister , MD Age: 43 Referring MD:  Date of Birth: 1976/02/17 Gender: Female Account #: [id number] Procedure:                Upper GI endoscopy Indications:              Epigastric abdominal pain, Heartburn Medicines:                Monitored Anesthesia Care Procedure:                Pre-Anesthesia Assessment:                           - Prior to the procedure, a History and Physical                            was performed, and patient medications and                            allergies were reviewed. The patient's tolerance of                            previous anesthesia was also reviewed. The risks                            and benefits of the procedure and the sedation                            options and risks were discussed with the patient.                            All questions were answered, and informed consent                            was obtained. Prior Anticoagulants: The patient has                            taken no previous anticoagulant or antiplatelet                            agents. ASA Grade Assessment: II - A patient with                            mild systemic disease. After reviewing the risks                            and benefits, the patient was deemed in                            satisfactory condition to undergo the procedure.                           After obtaining informed consent, the endoscope was  passed under direct vision. Throughout the                            procedure, the patient's blood pressure, pulse, and                            oxygen saturations were monitored continuously. The                            Endoscope was introduced through the mouth, and                            advanced to the second part of duodenum. The upper                            GI endoscopy  was accomplished without difficulty.                            The patient tolerated the procedure well. Scope In: Scope Out: Findings:                 Mild inflammation characterized by erythema and                            friability was found in the gastric antrum.                            Biopsies were taken with a cold forceps for                            histology.                           The exam was otherwise without abnormality. Complications:            No immediate complications. Estimated blood loss:                            None. Estimated Blood Loss:     Estimated blood loss: none. Impression:               - Mild gastritis, biospied to check for H. pylori.                           - The examination was otherwise normal. Recommendation:           - Patient has a contact number available for                            emergencies. The signs and symptoms of potential                            delayed complications were discussed with the                            patient. Return to normal activities tomorrow.  Written discharge instructions were provided to the                            patient.                           - Resume previous diet.                           - Continue present medications. For now stay on                            nexium before breakfast and pepcid (famotidine) at                            bedtime. This seems to be really helping.                           - Await pathology results. Milus Banister, MD 07/23/2019 1:43:42 PM This report has been signed electronically.

## 2019-07-28 ENCOUNTER — Telehealth: Payer: Self-pay | Admitting: *Deleted

## 2019-07-28 NOTE — Telephone Encounter (Signed)
  Follow up Call-  Call back number 07/23/2019  Post procedure Call Back phone  # [phone]  Permission to leave phone message Yes  Some recent data might be hidden     Patient questions:  Do you have a fever, pain , or abdominal swelling? No. Pain Score  0 *  Have you tolerated food without any problems? Yes.    Have you been able to return to your normal activities? Yes.    Do you have any questions about your discharge instructions: Diet   No. Medications  No. Follow up visit  No.  Do you have questions or concerns about your Care? No.  Actions: * If pain score is 4 or above: No action needed, pain <4.  1. Have you developed a fever since your procedure? no  2.   Have you had an respiratory symptoms (SOB or cough) since your procedure? no  3.   Have you tested positive for COVID 19 since your procedure no  4.   Have you had any family members/close contacts diagnosed with the COVID 19 since your procedure?  no   If yes to any of these questions please route to Joylene John, RN and Alphonsa Gin, Therapist, sports.

## 2019-07-29 MED FILL — CEPHALEXIN 500 MG CAPSULE: 500 | 14 days supply | Qty: 28 | Fill #0

## 2019-07-30 ENCOUNTER — Encounter: Payer: Self-pay | Admitting: Gastroenterology

## 2019-07-30 DIAGNOSIS — H04123 Dry eye syndrome of bilateral lacrimal glands: Secondary | ICD-10-CM | POA: Diagnosis not present

## 2019-07-30 DIAGNOSIS — H10413 Chronic giant papillary conjunctivitis, bilateral: Secondary | ICD-10-CM | POA: Diagnosis not present

## 2019-07-30 MED FILL — TOBRAMYCIN-DEXAMETH OPHTH S: 0.3-0.1 | 14 days supply | Qty: 3 | Fill #0

## 2019-08-02 MED FILL — ESCITALOPRAM 20 MG TABLET: 20 | 90 days supply | Qty: 90 | Fill #0

## 2019-08-02 MED FILL — LEFLUNOMIDE 20 MG TABLET: 20 | 30 days supply | Qty: 30 | Fill #3

## 2019-08-02 MED FILL — MONTELUKAST SOD 10 MG TAB: 10 | 90 days supply | Qty: 90 | Fill #0

## 2019-08-02 MED FILL — GABAPENTIN 300 MG CAPSULE: 300 | 30 days supply | Qty: 30 | Fill #2

## 2019-08-12 DIAGNOSIS — H18459 Nodular corneal degeneration, unspecified eye: Secondary | ICD-10-CM | POA: Diagnosis not present

## 2019-08-18 DIAGNOSIS — Z79899 Other long term (current) drug therapy: Secondary | ICD-10-CM | POA: Diagnosis not present

## 2019-08-18 DIAGNOSIS — M0589 Other rheumatoid arthritis with rheumatoid factor of multiple sites: Secondary | ICD-10-CM | POA: Diagnosis not present

## 2019-08-26 DIAGNOSIS — M2550 Pain in unspecified joint: Secondary | ICD-10-CM | POA: Diagnosis not present

## 2019-08-26 DIAGNOSIS — R21 Rash and other nonspecific skin eruption: Secondary | ICD-10-CM | POA: Diagnosis not present

## 2019-08-26 DIAGNOSIS — M0589 Other rheumatoid arthritis with rheumatoid factor of multiple sites: Secondary | ICD-10-CM | POA: Diagnosis not present

## 2019-08-26 DIAGNOSIS — Z6837 Body mass index (BMI) 37.0-37.9, adult: Secondary | ICD-10-CM | POA: Diagnosis not present

## 2019-08-26 DIAGNOSIS — Z7189 Other specified counseling: Secondary | ICD-10-CM | POA: Diagnosis not present

## 2019-08-26 DIAGNOSIS — E669 Obesity, unspecified: Secondary | ICD-10-CM | POA: Diagnosis not present

## 2019-08-26 DIAGNOSIS — Z8709 Personal history of other diseases of the respiratory system: Secondary | ICD-10-CM | POA: Diagnosis not present

## 2019-08-26 DIAGNOSIS — K121 Other forms of stomatitis: Secondary | ICD-10-CM | POA: Diagnosis not present

## 2019-08-26 DIAGNOSIS — L409 Psoriasis, unspecified: Secondary | ICD-10-CM | POA: Diagnosis not present

## 2019-08-26 MED FILL — ESOMEPRAZOLE MAG DR 40 MG C: 40 | 90 days supply | Qty: 90 | Fill #1

## 2019-09-17 DIAGNOSIS — L409 Psoriasis, unspecified: Secondary | ICD-10-CM | POA: Diagnosis not present

## 2019-09-17 DIAGNOSIS — L400 Psoriasis vulgaris: Secondary | ICD-10-CM | POA: Diagnosis not present

## 2019-09-17 DIAGNOSIS — Z79899 Other long term (current) drug therapy: Secondary | ICD-10-CM | POA: Diagnosis not present

## 2019-09-20 MED FILL — GABAPENTIN 300 MG CAPSULE: 300 | 30 days supply | Qty: 30 | Fill #3

## 2019-09-20 MED FILL — LEFLUNOMIDE 20 MG TABLET: 20 | 30 days supply | Qty: 30 | Fill #0

## 2019-09-20 MED FILL — FENOFIBRATE 160 MG TABLET: 160 | 90 days supply | Qty: 90 | Fill #1

## 2019-09-29 ENCOUNTER — Encounter: Payer: 59 | Admitting: Obstetrics & Gynecology

## 2019-09-30 ENCOUNTER — Other Ambulatory Visit: Payer: Self-pay | Admitting: Obstetrics & Gynecology

## 2019-09-30 MED FILL — ETONOGESTREL-ETHINYL ESTRAD: 0.12-0.015 | 84 days supply | Qty: 3 | Fill #0

## 2019-10-11 MED FILL — FOLIC ACID 1 MG TABS: 1 | 90 days supply | Qty: 90 | Fill #1

## 2019-10-13 DIAGNOSIS — L080 Pyoderma: Secondary | ICD-10-CM | POA: Diagnosis not present

## 2019-10-13 DIAGNOSIS — L089 Local infection of the skin and subcutaneous tissue, unspecified: Secondary | ICD-10-CM | POA: Diagnosis not present

## 2019-10-13 DIAGNOSIS — M0589 Other rheumatoid arthritis with rheumatoid factor of multiple sites: Secondary | ICD-10-CM | POA: Diagnosis not present

## 2019-10-13 DIAGNOSIS — Z79899 Other long term (current) drug therapy: Secondary | ICD-10-CM | POA: Diagnosis not present

## 2019-10-13 DIAGNOSIS — L409 Psoriasis, unspecified: Secondary | ICD-10-CM | POA: Diagnosis not present

## 2019-10-13 DIAGNOSIS — L0291 Cutaneous abscess, unspecified: Secondary | ICD-10-CM | POA: Diagnosis not present

## 2019-10-13 MED FILL — METHOTREXATE 25 MG/ML VIAL: 50 | 28 days supply | Qty: 4 | Fill #0

## 2019-10-15 MED FILL — LEFLUNOMIDE 20 MG TABLET: 20 | 30 days supply | Qty: 30 | Fill #0

## 2019-10-20 ENCOUNTER — Encounter: Payer: 59 | Admitting: Obstetrics & Gynecology

## 2019-10-26 ENCOUNTER — Other Ambulatory Visit: Payer: Self-pay

## 2019-10-27 ENCOUNTER — Encounter: Payer: Self-pay | Admitting: Obstetrics & Gynecology

## 2019-10-27 ENCOUNTER — Ambulatory Visit (INDEPENDENT_AMBULATORY_CARE_PROVIDER_SITE_OTHER): Payer: 59 | Admitting: Obstetrics & Gynecology

## 2019-10-27 VITALS — BP 126/82 | Ht 70.0 in | Wt 256.0 lb

## 2019-10-27 DIAGNOSIS — Z01419 Encounter for gynecological examination (general) (routine) without abnormal findings: Secondary | ICD-10-CM | POA: Diagnosis not present

## 2019-10-27 DIAGNOSIS — L732 Hidradenitis suppurativa: Secondary | ICD-10-CM

## 2019-10-27 DIAGNOSIS — E6609 Other obesity due to excess calories: Secondary | ICD-10-CM

## 2019-10-27 DIAGNOSIS — Z6836 Body mass index (BMI) 36.0-36.9, adult: Secondary | ICD-10-CM

## 2019-10-27 DIAGNOSIS — Z3044 Encounter for surveillance of vaginal ring hormonal contraceptive device: Secondary | ICD-10-CM

## 2019-10-27 MED ORDER — ETONOGESTREL-ETHINYL ESTRADIOL 0.12-0.015 MG/24HR VA RING: VAGINAL_RING | VAGINAL | 4 refills | Status: DC

## 2019-10-27 NOTE — Progress Notes (Signed)
April [name] 06/08/1976 [id number]   History:    43 y.o. G0 Married. Moved into a new house.  RP:  Established patient presenting for annual gyn exam   HPI: Well on Nuvaring continuous use.  No BTB.  No pelvic pain.  Frequent sebaceous gland cysts/abcesses at vulva/upper inner legs and breast/axilla.  No pain with IC.  Breasts wnl.  Psoriasis followed by Dermato.  BMI increased to 36.73  Not exercising regularly.  Health labs with Fam MD.  Anxiety/Depression stable on treatment.  Past medical history,surgical history, family history and social history were all reviewed and documented in the EPIC chart.  Gynecologic History No LMP recorded. (Menstrual status: Other).  Obstetric History OB History  Gravida Para Term Preterm AB Living  0 0 0 0 0 0  SAB TAB Ectopic Multiple Live Births  0 0 0 0 0     ROS: A ROS was performed and pertinent positives and negatives are included in the history.  GENERAL: No fevers or chills. HEENT: No change in vision, no earache, sore throat or sinus congestion. NECK: No pain or stiffness. CARDIOVASCULAR: No chest pain or pressure. No palpitations. PULMONARY: No shortness of breath, cough or wheeze. GASTROINTESTINAL: No abdominal pain, nausea, vomiting or diarrhea, melena or bright red blood per rectum. GENITOURINARY: No urinary frequency, urgency, hesitancy or dysuria. MUSCULOSKELETAL: No joint or muscle pain, no back pain, no recent trauma. DERMATOLOGIC: No rash, no itching, no lesions. ENDOCRINE: No polyuria, polydipsia, no heat or cold intolerance. No recent change in weight. HEMATOLOGICAL: No anemia or easy bruising or bleeding. NEUROLOGIC: No headache, seizures, numbness, tingling or weakness. PSYCHIATRIC: No depression, no loss of interest in normal activity or change in sleep pattern.     Exam:   BP 126/82 (BP Location: Right Arm, Patient Position: Sitting, Cuff Size: Large)   Ht 5\' 10"  (1.778 m)   Wt 256 lb (116.1 kg)   BMI 36.73 kg/m    Body mass index is 36.73 kg/m.  General appearance : Well developed well nourished female. No acute distress HEENT: Eyes: no retinal hemorrhage or exudates,  Neck supple, trachea midline, no carotid bruits, no thyroidmegaly Lungs: Clear to auscultation, no rhonchi or wheezes, or rib retractions  Heart: Regular rate and rhythm, no murmurs or gallops Breast:Examined in sitting and supine position were symmetrical in appearance, no palpable masses or tenderness,  no skin retraction, no nipple inversion, no nipple discharge, no skin discoloration, no axillary or supraclavicular lymphadenopathy Abdomen: no palpable masses or tenderness, no rebound or guarding Extremities: no edema or skin discoloration or tenderness  Pelvic: Vulva: Normal             Vagina: No gross lesions or discharge  Cervix: No gross lesions or discharge  Uterus  AV, normal size, shape and consistency, non-tender and mobile  Adnexa  Without masses or tenderness  Anus: Normal   Assessment/Plan:  43 y.o. female for annual exam   1. Well female exam with routine gynecological exam Normal gynecologic exam except for hidradenitis suppurativa.  Pap test negative 09/2018, no indication to repeat this year.  Breasts normal.  Mammo 10/2018 negative.  Health labs with Fam MD.  2. Encounter for surveillance of vaginal ring hormonal contraceptive device Well on continuous Nuvaring.  No contraindication to continue.  Prescription sent to pharmacy.  3. Hidradenitis suppurativa Currently minimal active infections, but small scars present.  Precautions and preventive measures discussed.  Will continue with diluted Clorox bathing.    4.  Class 2 obesity due to excess calories without serious comorbidity with body mass index (BMI) of 36.0 to 36.9 in adult Recommend a lower calorie/carb diet such as [employer].  Aerobic physical activities 5 times a week and light weightlifting every 2 days.  Other orders - methotrexate 50  MG/2ML injection; as directed. 20mg  once weekly - golimumab 2 mg/kg in sodium chloride 0.9 %; Inject into the vein. Once every 2 months - etonogestrel-ethinyl estradiol (NUVARING) 0.12-0.015 MG/24HR vaginal ring; REMOVE AND INSERT A NEW RING INTO THE VAGINA EVERY 4 WEEKS *USE CONTINUOUSLY*  Princess Bruins MD, 2:42 PM 10/27/2019

## 2019-10-27 NOTE — Patient Instructions (Signed)
1. Well female exam with routine gynecological exam Normal gynecologic exam except for hidradenitis suppurativa.  Pap test negative 09/2018, no indication to repeat this year.  Breasts normal.  Mammo 10/2018 negative.  Health labs with Fam MD.  2. Encounter for surveillance of vaginal ring hormonal contraceptive device Well on continuous Nuvaring.  No contraindication to continue.  Prescription sent to pharmacy.  3. Hidradenitis suppurativa Currently minimal active infections, but small scars present.  Precautions and preventive measures discussed.  Will continue with diluted Clorox bathing.    4. Class 2 obesity due to excess calories without serious comorbidity with body mass index (BMI) of 36.0 to 36.9 in adult Recommend a lower calorie/carb diet such as [employer].  Aerobic physical activities 5 times a week and light weightlifting every 2 days.  Other orders - methotrexate 50 MG/2ML injection; as directed. 20mg  once weekly - golimumab 2 mg/kg in sodium chloride 0.9 %; Inject into the vein. Once every 2 months - etonogestrel-ethinyl estradiol (NUVARING) 0.12-0.015 MG/24HR vaginal ring; REMOVE AND INSERT A NEW RING INTO THE VAGINA EVERY 4 WEEKS *USE CONTINUOUSLY*  Sharon, it was a pleasure seeing you today!

## 2019-11-03 MED FILL — MONTELUKAST SOD 10 MG TAB: 10 | 90 days supply | Qty: 90 | Fill #1

## 2019-11-03 MED FILL — ESCITALOPRAM 20 MG TABLET: 20 | 90 days supply | Qty: 90 | Fill #1

## 2019-11-08 MED FILL — METHOTREXATE 25 MG/ML VIAL: 50 | 28 days supply | Qty: 4 | Fill #1

## 2019-11-15 ENCOUNTER — Encounter: Payer: Self-pay | Admitting: Obstetrics & Gynecology

## 2019-11-15 DIAGNOSIS — Z1231 Encounter for screening mammogram for malignant neoplasm of breast: Secondary | ICD-10-CM | POA: Diagnosis not present

## 2019-11-15 DIAGNOSIS — Z803 Family history of malignant neoplasm of breast: Secondary | ICD-10-CM | POA: Diagnosis not present

## 2019-11-22 ENCOUNTER — Encounter: Payer: Self-pay | Admitting: Internal Medicine

## 2019-11-24 MED FILL — LEFLUNOMIDE 20 MG TABLET: 20 | 30 days supply | Qty: 30 | Fill #1

## 2019-11-24 MED FILL — ESOMEPRAZOLE MAG DR 40 MG C: 40 | 90 days supply | Qty: 90 | Fill #2

## 2019-12-10 DIAGNOSIS — Z79899 Other long term (current) drug therapy: Secondary | ICD-10-CM | POA: Diagnosis not present

## 2019-12-10 DIAGNOSIS — M0589 Other rheumatoid arthritis with rheumatoid factor of multiple sites: Secondary | ICD-10-CM | POA: Diagnosis not present

## 2019-12-10 IMAGING — DX DG CHEST 1V PORT
1 series · 1 of 1 positions shown · non-contrast
Comparison: 12/20/2016

CLINICAL DATA: Shortness of breath since yesterday. Some chest
pain.

EXAM:
PORTABLE CHEST 1 VIEW

[chest ap]
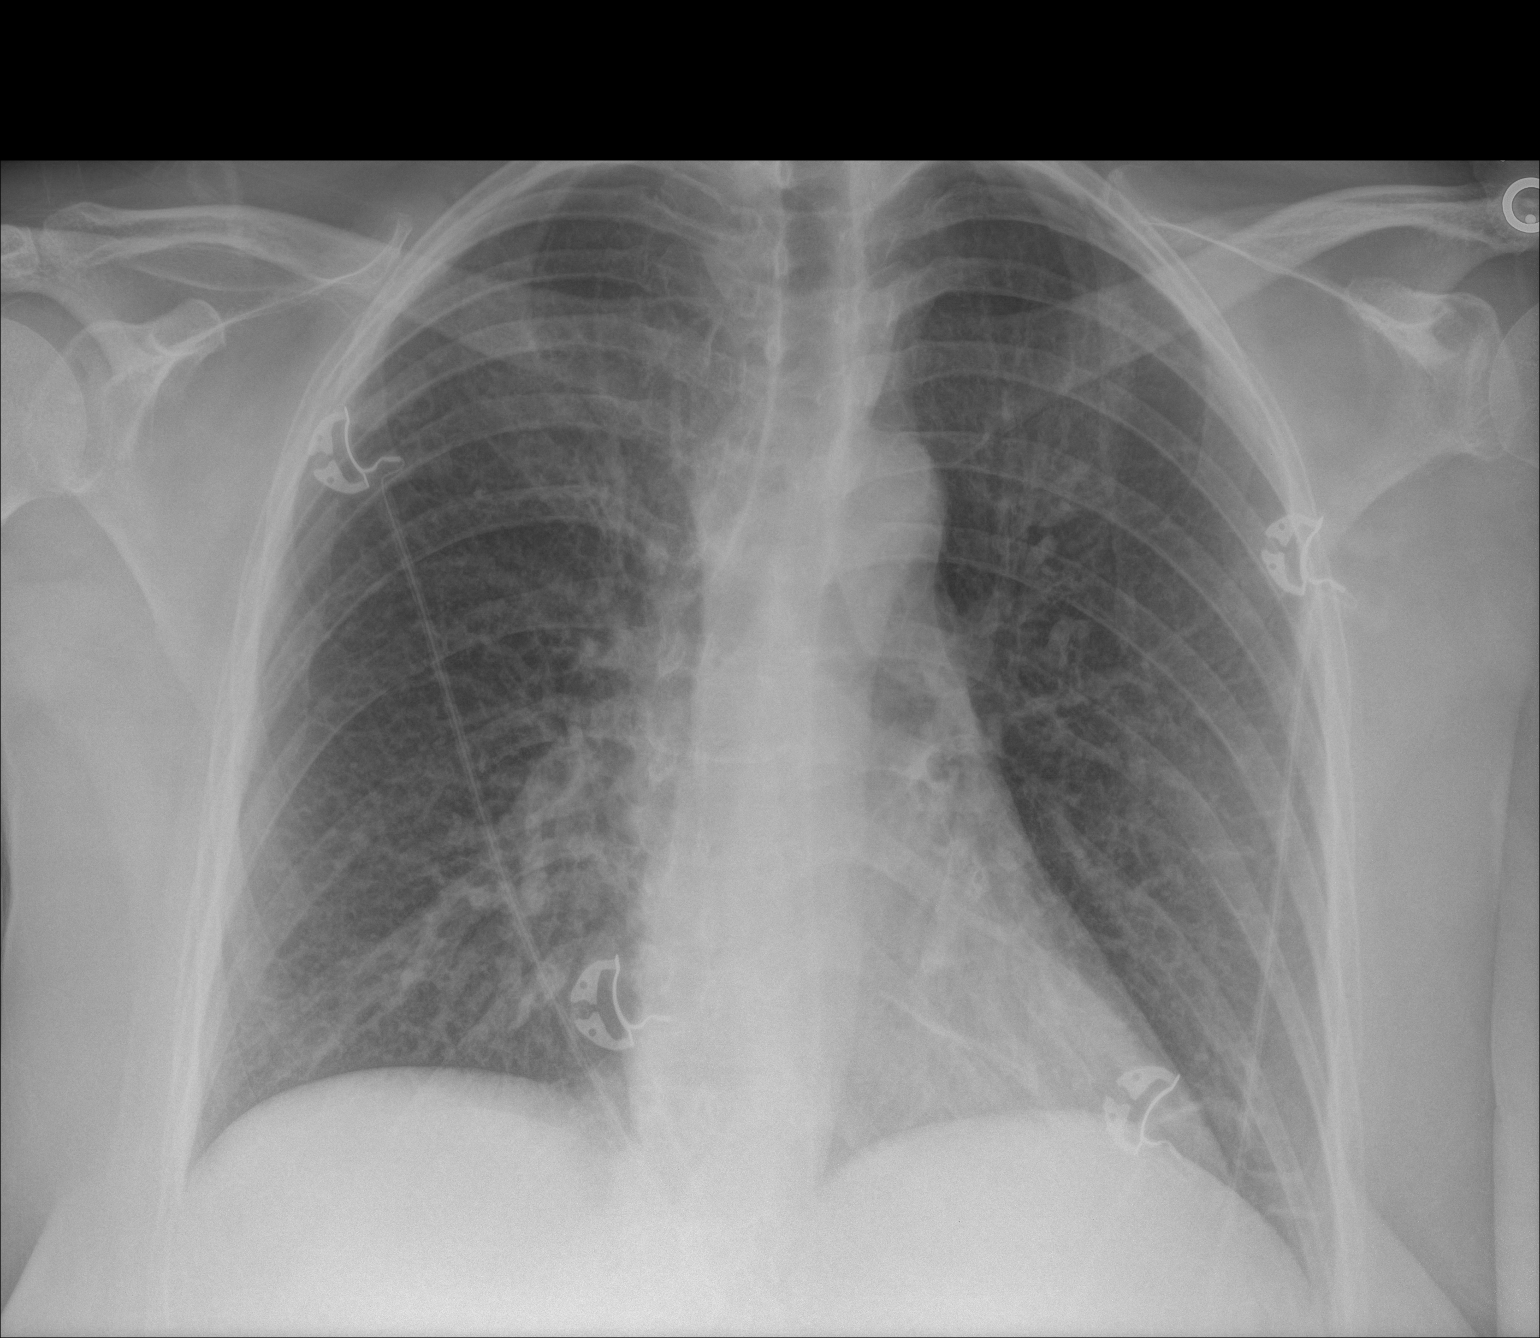

[1 of 1 positions shown; findings below may reference images not displayed]

FINDINGS: Cardiac silhouette is normal in size. No mediastinal or hilar
masses. There is no evidence of adenopathy.

Prominent bronchovascular markings noted bilaterally. Lungs
otherwise clear. No pleural effusion or pneumothorax.

Skeletal structures are grossly intact.
IMPRESSION: No active disease.

## 2019-12-14 ENCOUNTER — Encounter: Payer: Self-pay | Admitting: Internal Medicine

## 2019-12-14 ENCOUNTER — Other Ambulatory Visit: Payer: Self-pay

## 2019-12-14 ENCOUNTER — Ambulatory Visit (INDEPENDENT_AMBULATORY_CARE_PROVIDER_SITE_OTHER): Payer: 59 | Admitting: Internal Medicine

## 2019-12-14 VITALS — BP 154/82 | HR 91 | Temp 99.0°F | Ht 70.0 in | Wt 260.0 lb

## 2019-12-14 DIAGNOSIS — N611 Abscess of the breast and nipple: Secondary | ICD-10-CM | POA: Diagnosis not present

## 2019-12-14 DIAGNOSIS — R739 Hyperglycemia, unspecified: Secondary | ICD-10-CM

## 2019-12-14 DIAGNOSIS — I10 Essential (primary) hypertension: Secondary | ICD-10-CM | POA: Diagnosis not present

## 2019-12-14 MED ORDER — LOSARTAN POTASSIUM 50 MG PO TABS: 50.0000 mg | ORAL_TABLET | Freq: Every day | ORAL | 3 refills | Status: DC

## 2019-12-14 MED ORDER — DOXYCYCLINE HYCLATE 100 MG PO TABS: 100.0000 mg | ORAL_TABLET | Freq: Two times a day (BID) | ORAL | 0 refills | Status: DC

## 2019-12-14 MED FILL — DOXYCYCLINE HYCLATE 100 MG: 100 | 10 days supply | Qty: 20 | Fill #0

## 2019-12-14 MED FILL — LOSARTAN POTASSIUM 50 MG TA: 50 | 90 days supply | Qty: 90 | Fill #0

## 2019-12-14 NOTE — Progress Notes (Addendum)
Subjective:    [name], female    DOB: 1976/09/07, 44 y.o.   MRN: [id number]  HPI  Here with c/o left lateral breast red/tender/swelling abscess that drained yesterday, but has red streaks towards the medial breast and the left axilla with tender soreness. .BP has been elevated at several recent visit assoc with wt gain. Pt denies chest pain, increased sob or doe, wheezing, orthopnea, PND, increased LE swelling, palpitations, dizziness or syncope.  Pt denies new neurological symptoms such as new headache, or facial or extremity weakness or numbness   Pt denies polydipsia, polyuria,  Wt Readings from Last 3 Encounters:  12/14/19 260 lb (117.9 kg)  10/27/19 256 lb (116.1 kg)  07/23/19 251 lb (113.9 kg)    BP Readings from Last 3 Encounters:  12/14/19 (!) 154/82  10/27/19 126/82  07/23/19 (!) 157/91   Past Medical History:  Diagnosis Date  . ALLERGIC RHINITIS   . Anal fissure   . Anxiety   . ANXIETY DEPRESSION   . CONSTIPATION   . DEPRESSION   . Esophageal reflux   . GERD (gastroesophageal reflux disease)   . HYPERCHOLESTEROLEMIA   . OBESITY   . Occipital neuralgia   . Palpitations   . Rheumatoid arthritis ([place]) 12/24/2017   Past Surgical History:  Procedure Laterality Date  . NO PAST SURGERIES      reports that she has never smoked. She has never used smokeless tobacco. She reports that she does not drink alcohol or use drugs. family history includes Breast cancer (age of onset: 72) in her mother; Colon polyps in her father and mother; Coronary artery disease in her father; Diabetes in her mother; Hyperlipidemia in her brother, father, and mother; Hypertension in her mother; Osteoarthritis in her mother; Prostate cancer in her father; Stomach cancer in her paternal grandmother. Allergies  Allergen Reactions  . Ciprofloxacin     toxicity  . Levaquin [Levofloxacin] Anxiety    Possible CNS side effects. TOXICITY  . Moxifloxacin     TOXICITY   Current  Outpatient Medications on File Prior to Visit  Medication Sig Dispense Refill  . ascorbic acid (VITAMIN C) 1000 MG tablet Take 1,000 mg by mouth daily.    . cetirizine (ZYRTEC) 10 MG tablet Take 1 tablet (10 mg total) by mouth daily. 30 tablet 3  . Cholecalciferol (VITAMIN D3) 125 MCG (5000 UT) CAPS Take 1 capsule by mouth daily.    . clobetasol ointment (TEMOVATE) 0.05 % APPLY TO AFFECTED AREA TWICE DAILY FOR 10 DAYS  0  . escitalopram (LEXAPRO) 20 MG tablet Take 1 tablet (20 mg total) by mouth daily. 90 tablet 3  . esomeprazole (NEXIUM) 40 MG capsule Take 1 capsule (40 mg total) by mouth daily. 90 capsule 3  . etonogestrel-ethinyl estradiol (NUVARING) 0.12-0.015 MG/24HR vaginal ring REMOVE AND INSERT A NEW RING INTO THE VAGINA EVERY 4 WEEKS *USE CONTINUOUSLY* 3 each 4  . famotidine (PEPCID) 40 MG tablet Take 1 tablet (40 mg total) by mouth every evening. 30 tablet 3  . fenofibrate 160 MG tablet Take 1 tablet (160 mg total) by mouth daily. 90 tablet 3  . golimumab 2 mg/kg in sodium chloride 0.9 % Inject into the vein. Once every 2 months    . Ibuprofen-Famotidine (DUEXIS) 800-26.6 MG TABS Take 800 mg by mouth 3 (three) times daily. 90 tablet 3  . leflunomide (ARAVA) 20 MG tablet Take 20 mg by mouth daily.    Marland Kitchen MAGNESIUM MALATE PO Take 1  tablet by mouth at bedtime.    . Methotrexate 25 MG/ML SOSY once a week.    . montelukast (SINGULAIR) 10 MG tablet Take 1 tablet (10 mg total) by mouth at bedtime. 90 tablet 3  . Multiple Vitamin (MULTIVITAMIN) tablet Take 1 tablet by mouth daily.      . Omega-3 Fatty Acids (FISH OIL) 1000 MG CAPS Take 2 capsules by mouth 2 (two) times daily.     . Probiotic Product (SOLUBLE FIBER/PROBIOTICS PO) Take 1 capsule by mouth daily.     . Turmeric 500 MG TABS Take 1 tablet by mouth 2 (two) times daily.     No current facility-administered medications on file prior to visit.   Review of Systems All otherwise neg per pt     Objective:   Physical Exam BP (!)  154/82 (BP Location: Right Arm, Patient Position: Sitting, Cuff Size: Large)   Pulse 91   Temp 99 F (37.2 C) (Oral)   Ht 5\' 10"  (1.778 m)   Wt 260 lb (117.9 kg)   SpO2 99%   BMI 37.31 kg/m  VS noted,  Constitutional: Pt appears in NAD HENT: Head: NCAT.  Right Ear: External ear normal.  Left Ear: External ear normal.  Eyes: . Pupils are equal, round, and reactive to light. Conjunctivae and EOM are normal Nose: without d/c or deformity Neck: Neck supple. Gross normal ROM Left breast with lateral scabbed over 1 cm area mild tender non fluctuant already drained but with red streak towards medial breast and tender vague swelling to the left axilla Cardiovascular: Normal rate and regular rhythm.   Pulmonary/Chest: Effort normal and breath sounds without rales or wheezing.  Abd:  Soft, NT, ND, + BS, no organomegaly Neurological: Pt is alert. At baseline orientation, motor grossly intact Skin: Skin is warm. No rashes, other new lesions, no LE edema Psychiatric: Pt behavior is normal without agitation  All otherwise neg per pt Lab Results  Component Value Date   WBC 9.2 12/15/2018   HGB 13.0 12/15/2018   HCT 41.5 12/15/2018   PLT 463 (H) 12/15/2018   GLUCOSE 127 (H) 12/15/2018   CHOL 205 (H) 05/26/2019   TRIG 391.0 (H) 05/26/2019   HDL 47.50 05/26/2019   LDLDIRECT 96.0 05/26/2019   LDLCALC 110 (H) 05/22/2018   ALT 21 12/15/2018   AST 25 12/15/2018   NA 136 12/15/2018   K 3.5 12/15/2018   CL 106 12/15/2018   CREATININE 0.76 12/15/2018   BUN 11 12/15/2018   CO2 18 (L) 12/15/2018   TSH 1.28 05/26/2019   HGBA1C 5.5 05/26/2019      Assessment & Plan:

## 2019-12-14 NOTE — Patient Instructions (Signed)
Please take all new medication as prescribed - the antibiotic, and losartan for blood pressure  Please call in 1-2 wks if the blood pressure is not more often less than 140/90  Please continue all other medications as before, and refills have been done if requested.  Please have the pharmacy call with any other refills you may need.  Please continue your efforts at being more active, low cholesterol diet, and weight control.  Please keep your appointments with your specialists as you may have planned

## 2019-12-14 NOTE — Assessment & Plan Note (Signed)
stable overall by history and exam, recent data reviewed with pt, and pt to continue medical treatment as before,  to f/u any worsening symptoms or concerns  

## 2019-12-14 NOTE — Assessment & Plan Note (Signed)
New onset, possibly related to wt gain, for losartan 50 qd, f/u BP at work and home

## 2019-12-14 NOTE — Assessment & Plan Note (Addendum)
Mild to mod, for antibx course,  to f/u any worsening symptoms or concerns  I spent 31 minutes preparing to see the patient by review of recent labs, imaging and procedures, obtaining and reviewing separately obtained history, communicating with the patient and family or caregiver, ordering medications, tests or procedures, and documenting clinical information in the EHR including the differential Dx, treatment, and any further evaluation and other management of left breast abscess with left axillary spread, HTN, and hyperglycemia

## 2019-12-24 MED FILL — FENOFIBRATE 160 MG TABLET: 160 | 90 days supply | Qty: 90 | Fill #2

## 2019-12-24 MED FILL — METHOTREXATE 25 MG/ML VIAL: 50 | 28 days supply | Qty: 4 | Fill #2

## 2019-12-24 MED FILL — ETONOGESTREL-ETHINYL ESTRAD: 0.12-0.015 | 84 days supply | Qty: 3 | Fill #0

## 2019-12-27 DIAGNOSIS — L409 Psoriasis, unspecified: Secondary | ICD-10-CM | POA: Diagnosis not present

## 2019-12-27 DIAGNOSIS — M0589 Other rheumatoid arthritis with rheumatoid factor of multiple sites: Secondary | ICD-10-CM | POA: Diagnosis not present

## 2019-12-27 DIAGNOSIS — K121 Other forms of stomatitis: Secondary | ICD-10-CM | POA: Diagnosis not present

## 2019-12-27 DIAGNOSIS — R21 Rash and other nonspecific skin eruption: Secondary | ICD-10-CM | POA: Diagnosis not present

## 2019-12-27 DIAGNOSIS — Z8709 Personal history of other diseases of the respiratory system: Secondary | ICD-10-CM | POA: Diagnosis not present

## 2019-12-27 DIAGNOSIS — Z7189 Other specified counseling: Secondary | ICD-10-CM | POA: Diagnosis not present

## 2019-12-27 DIAGNOSIS — Z6838 Body mass index (BMI) 38.0-38.9, adult: Secondary | ICD-10-CM | POA: Diagnosis not present

## 2019-12-27 DIAGNOSIS — E669 Obesity, unspecified: Secondary | ICD-10-CM | POA: Diagnosis not present

## 2019-12-27 DIAGNOSIS — M2550 Pain in unspecified joint: Secondary | ICD-10-CM | POA: Diagnosis not present

## 2020-01-17 MED FILL — METHOTREXATE 25 MG/ML VIAL: 50 | 90 days supply | Qty: 12 | Fill #0

## 2020-01-20 DIAGNOSIS — L409 Psoriasis, unspecified: Secondary | ICD-10-CM | POA: Diagnosis not present

## 2020-01-24 MED FILL — CLOBETASOL PROPIONATE 0.05: 0.05 | 25 days supply | Qty: 60 | Fill #0

## 2020-01-27 ENCOUNTER — Encounter: Payer: Self-pay | Admitting: Family Medicine

## 2020-01-27 ENCOUNTER — Ambulatory Visit: Payer: 59 | Admitting: Family Medicine

## 2020-01-27 ENCOUNTER — Other Ambulatory Visit: Payer: Self-pay

## 2020-01-27 VITALS — BP 112/74 | HR 84 | Ht 70.0 in | Wt 260.0 lb

## 2020-01-27 DIAGNOSIS — M999 Biomechanical lesion, unspecified: Secondary | ICD-10-CM

## 2020-01-27 DIAGNOSIS — M533 Sacrococcygeal disorders, not elsewhere classified: Secondary | ICD-10-CM | POA: Insufficient documentation

## 2020-01-27 NOTE — Assessment & Plan Note (Signed)
New problem sacroiliac dysfunction.  Discussed with patient in great length about home exercise, icing regimen, which activities to doing which wants to avoid.  Discussed posture and ergonomics.  Follow-up again in 4 to 6 weeks

## 2020-01-27 NOTE — Patient Instructions (Signed)
Planks or mountain climbers for core See me back in 4-5 weeks

## 2020-01-27 NOTE — Assessment & Plan Note (Signed)
Decision today to treat with OMT was based on Physical Exam  After verbal consent patient was treated with HVLA, ME, FPR techniques in cervical, thoracic, rib,  lumbar and sacral areas  Patient tolerated the procedure well with improvement in symptoms  Patient given exercises, stretches and lifestyle modifications  See medications in patient instructions if given  Patient will follow up in 4-8 weeks 

## 2020-01-27 NOTE — Progress Notes (Signed)
[facility] [place] Phone: [phone] Subjective:   [name], am serving as a scribe for Dr. Hulan Saas.\This visit occurred during the SARS-CoV-2 public health emergency.  Safety protocols were in place, including screening questions prior to the visit, additional usage of staff PPE, and extensive cleaning of exam room while observing appropriate contact time as indicated for disinfecting solutions.   I'm seeing this patient by the request  of:  Biagio Borg, MD  CC: Low back pain  [id number]  April [name] is a 44 y.o. female coming in with complaint of back pain. Last seen on 01/28/2019 for OMT. Patient states that she has been having an increase in low back pain for 4 weeks. Pain located in glutes.  Is unsure as to why her pain increased. Patient fell in October down the stairs. Has been trying abdominal exercises. Yoga does help ease her pain.  Patient states that it is dull, throbbing aching pain.  Not quite as severe as some of her other low back but now more nagging.  Wants to workout on a regular basis but finds it difficult anytime she works core she has increasing discomfort and pain      Past Medical History:  Diagnosis Date  . ALLERGIC RHINITIS   . Anal fissure   . Anxiety   . ANXIETY DEPRESSION   . CONSTIPATION   . DEPRESSION   . Esophageal reflux   . GERD (gastroesophageal reflux disease)   . HYPERCHOLESTEROLEMIA   . OBESITY   . Occipital neuralgia   . Palpitations   . Rheumatoid arthritis ([place]) 12/24/2017   Past Surgical History:  Procedure Laterality Date  . NO PAST SURGERIES     Social History   Socioeconomic History  . Marital status: Married    Spouse name: Not on file  . Number of children: 0  . Years of education: Not on file  . Highest education level: Not on file  Occupational History  . Occupation: Pharmacist  Tobacco Use  . Smoking status: Never Smoker  .  Smokeless tobacco: Never Used  . Tobacco comment: Married, lives with spouse  Substance and Sexual Activity  . Alcohol use: No  . Drug use: No  . Sexual activity: Yes    Partners: Male    Birth control/protection: Inserts, None    Comment: 1ST INTERCOURSE- 21, PARTNERS- 3   Other Topics Concern  . Not on file  Social History Narrative  . Not on file   Social Determinants of Health   Financial Resource Strain:   . Difficulty of Paying Living Expenses:   Food Insecurity:   . Worried About Charity fundraiser in the Last Year:   . Arboriculturist in the Last Year:   Transportation Needs:   . Film/video editor (Medical):   Marland Kitchen Lack of Transportation (Non-Medical):   Physical Activity:   . Days of Exercise per Week:   . Minutes of Exercise per Session:   Stress:   . Feeling of Stress :   Social Connections:   . Frequency of Communication with Friends and Family:   . Frequency of Social Gatherings with Friends and Family:   . Attends Religious Services:   . Active Member of Clubs or Organizations:   . Attends Archivist Meetings:   Marland Kitchen Marital Status:    Allergies  Allergen Reactions  . Ciprofloxacin     toxicity  .  Levaquin [Levofloxacin] Anxiety    Possible CNS side effects. TOXICITY  . Moxifloxacin     TOXICITY   Family History  Problem Relation Age of Onset  . Hypertension Mother   . Diabetes Mother   . Hyperlipidemia Mother   . Breast cancer Mother 14  . Osteoarthritis Mother   . Colon polyps Mother   . Hyperlipidemia Father   . Coronary artery disease Father        MI age 49  . Prostate cancer Father   . Colon polyps Father   . Hyperlipidemia Brother   . Stomach cancer Paternal Grandmother     Current Outpatient Medications (Endocrine & Metabolic):  .  etonogestrel-ethinyl estradiol (NUVARING) 0.12-0.015 MG/24HR vaginal ring, REMOVE AND INSERT A NEW RING INTO THE VAGINA EVERY 4 WEEKS *USE CONTINUOUSLY*  Current Outpatient Medications  (Cardiovascular):  .  fenofibrate 160 MG tablet, Take 1 tablet (160 mg total) by mouth daily. Marland Kitchen  losartan (COZAAR) 50 MG tablet, Take 1 tablet (50 mg total) by mouth daily.  Current Outpatient Medications (Respiratory):  .  cetirizine (ZYRTEC) 10 MG tablet, Take 1 tablet (10 mg total) by mouth daily. .  montelukast (SINGULAIR) 10 MG tablet, Take 1 tablet (10 mg total) by mouth at bedtime.  Current Outpatient Medications (Analgesics):  .  golimumab 2 mg/kg in sodium chloride 0.9 %, Inject into the vein. Once every 2 months .  Ibuprofen-Famotidine (DUEXIS) 800-26.6 MG TABS, Take 800 mg by mouth 3 (three) times daily. .  Methotrexate 25 MG/ML SOSY, once a week. .  leflunomide (ARAVA) 20 MG tablet, Take 20 mg by mouth daily.   Current Outpatient Medications (Other):  .  ascorbic acid (VITAMIN C) 1000 MG tablet, Take 1,000 mg by mouth daily. .  Cholecalciferol (VITAMIN D3) 125 MCG (5000 UT) CAPS, Take 1 capsule by mouth daily. .  clobetasol ointment (TEMOVATE) 0.05 %, APPLY TO AFFECTED AREA TWICE DAILY FOR 10 DAYS .  escitalopram (LEXAPRO) 20 MG tablet, Take 1 tablet (20 mg total) by mouth daily. Marland Kitchen  esomeprazole (NEXIUM) 40 MG capsule, Take 1 capsule (40 mg total) by mouth daily. .  famotidine (PEPCID) 40 MG tablet, Take 1 tablet (40 mg total) by mouth every evening. Marland Kitchen  MAGNESIUM MALATE PO, Take 1 tablet by mouth at bedtime. .  Multiple Vitamin (MULTIVITAMIN) tablet, Take 1 tablet by mouth daily.   .  Omega-3 Fatty Acids (FISH OIL) 1000 MG CAPS, Take 2 capsules by mouth 2 (two) times daily.  .  Probiotic Product (SOLUBLE FIBER/PROBIOTICS PO), Take 1 capsule by mouth daily.  .  Turmeric 500 MG TABS, Take 1 tablet by mouth 2 (two) times daily. Marland Kitchen  doxycycline (VIBRA-TABS) 100 MG tablet, Take 1 tablet (100 mg total) by mouth 2 (two) times daily.   Reviewed prior external information including notes and imaging from  primary care provider As well as notes that were available from care  everywhere and other healthcare systems.  Past medical history, social, surgical and family history all reviewed in electronic medical record.  No pertanent information unless stated regarding to the chief complaint.   Review of Systems:  No headache, visual changes, nausea, vomiting, diarrhea, constipation, dizziness, abdominal pain, skin rash, fevers, chills, night sweats, weight loss, swollen lymph nodes, body aches, joint swelling, chest pain, shortness of breath, mood changes. POSITIVE muscle aches  Objective  Blood pressure 112/74, pulse 84, height 5\' 10"  (1.778 m), weight 260 lb (117.9 kg), SpO2 99 %.   General: No apparent distress  alert and oriented x3 mood and affect normal, dressed appropriately.  HEENT: Pupils equal, extraocular movements intact  Respiratory: Patient's speak in full sentences and does not appear short of breath  Cardiovascular: No lower extremity edema, non tender, no erythema  Skin: Warm dry intact with no signs of infection or rash on extremities or on axial skeleton.  Abdomen: Soft nontender  Neuro: Cranial nerves II through XII are intact, neurovascularly intact in all extremities with 2+ DTRs and 2+ pulses.  Lymph: No lymphadenopathy of posterior or anterior cervical chain or axillae bilaterally.  Gait normal with good balance and coordination.  MSK:  tender with mild limited range of motion and good stability and symmetric strength and tone of shoulders, elbows, wrist, hip, knee and ankles bilaterally.   Low back exam does show some tightness noted to the sacroiliac joint right greater than left.  Positive FABER test.  Negative straight leg test.  Patient does have actually increasing lordosis of the L4-L5 area can.  To previous exams.  5-5 strength in lower extremities.  Osteopathic findings  C2 flexed rotated and side bent right C4 flexed rotated and side bent left C6 flexed rotated and side bent left T3 extended rotated and side bent right inhaled  third rib T9 extended rotated and side bent left L2 flexed rotated and side bent right Sacrum right on right     Impression and Recommendations:     This case required medical decision making of moderate complexity. The above documentation has been reviewed and is accurate and complete Lyndal Pulley, DO       Note: This dictation was prepared with Dragon dictation along with smaller phrase technology. Any transcriptional errors that result from this process are unintentional.

## 2020-02-01 MED FILL — ESCITALOPRAM 20 MG TABLET: 20 | 90 days supply | Qty: 90 | Fill #2

## 2020-02-01 MED FILL — FOLIC ACID 1 MG TABS: 1 | 90 days supply | Qty: 90 | Fill #2

## 2020-02-01 MED FILL — MONTELUKAST SOD 10 MG TAB: 10 | 90 days supply | Qty: 90 | Fill #2

## 2020-02-02 MED FILL — CLOBETASOL PROPIONATE 0.05: 0.05 | 25 days supply | Qty: 60 | Fill #0

## 2020-02-09 DIAGNOSIS — Z79899 Other long term (current) drug therapy: Secondary | ICD-10-CM | POA: Diagnosis not present

## 2020-02-09 DIAGNOSIS — M0589 Other rheumatoid arthritis with rheumatoid factor of multiple sites: Secondary | ICD-10-CM | POA: Diagnosis not present

## 2020-02-21 MED FILL — ESOMEPRAZOLE MAG DR 40 MG C: 40 | 90 days supply | Qty: 90 | Fill #3

## 2020-02-22 ENCOUNTER — Ambulatory Visit: Payer: 59 | Admitting: Family Medicine

## 2020-02-28 MED FILL — GABAPENTIN 300 MG CAPSULE: 300 | 30 days supply | Qty: 30 | Fill #4

## 2020-03-07 ENCOUNTER — Encounter: Payer: Self-pay | Admitting: Internal Medicine

## 2020-03-07 NOTE — Telephone Encounter (Signed)
I do not normally do this, but perhaps another MD would be able today, if not, pt should consider Urgent Care

## 2020-03-08 DIAGNOSIS — H5213 Myopia, bilateral: Secondary | ICD-10-CM | POA: Diagnosis not present

## 2020-03-08 NOTE — Progress Notes (Signed)
[name] [place] Phone: [phone] Subjective:   Rito Ehrlich, am serving as a scribe for Dr. Hulan Saas.  This visit occurred during the SARS-CoV-2 public health emergency.  Safety protocols were in place, including screening questions prior to the visit, additional usage of staff PPE, and extensive cleaning of exam room while observing appropriate contact time as indicated for disinfecting solutions.   I'm seeing this patient by the request  of:  Biagio Borg, MD  CC: Low back pain follow-up  [id number]  April [name] is a 44 y.o. female coming in with complaint of back pain. Last seen on 01/27/2020 for OMT. Patient states there is some improvement in R SI joint but not as happy as she would like it to be.  Patient continues to have some inflammation and hurts around the sacroiliac joint.  Mild intermittent radiation down the leg but nothing severe.      Past Medical History:  Diagnosis Date  . ALLERGIC RHINITIS   . Anal fissure   . Anxiety   . ANXIETY DEPRESSION   . CONSTIPATION   . DEPRESSION   . Esophageal reflux   . GERD (gastroesophageal reflux disease)   . HYPERCHOLESTEROLEMIA   . OBESITY   . Occipital neuralgia   . Palpitations   . Rheumatoid arthritis ([place]) 12/24/2017   Past Surgical History:  Procedure Laterality Date  . NO PAST SURGERIES     Social History   Socioeconomic History  . Marital status: Married    Spouse name: Not on file  . Number of children: 0  . Years of education: Not on file  . Highest education level: Not on file  Occupational History  . Occupation: Pharmacist  Tobacco Use  . Smoking status: Never Smoker  . Smokeless tobacco: Never Used  . Tobacco comment: Married, lives with spouse  Substance and Sexual Activity  . Alcohol use: No  . Drug use: No  . Sexual activity: Yes    Partners: Male    Birth control/protection: Inserts, None    Comment: 1ST  INTERCOURSE- 21, PARTNERS- 3   Other Topics Concern  . Not on file  Social History Narrative  . Not on file   Social Determinants of Health   Financial Resource Strain:   . Difficulty of Paying Living Expenses:   Food Insecurity:   . Worried About Charity fundraiser in the Last Year:   . Arboriculturist in the Last Year:   Transportation Needs:   . Film/video editor (Medical):   Marland Kitchen Lack of Transportation (Non-Medical):   Physical Activity:   . Days of Exercise per Week:   . Minutes of Exercise per Session:   Stress:   . Feeling of Stress :   Social Connections:   . Frequency of Communication with Friends and Family:   . Frequency of Social Gatherings with Friends and Family:   . Attends Religious Services:   . Active Member of Clubs or Organizations:   . Attends Archivist Meetings:   Marland Kitchen Marital Status:    Allergies  Allergen Reactions  . Ciprofloxacin     toxicity  . Levaquin [Levofloxacin] Anxiety    Possible CNS side effects. TOXICITY  . Moxifloxacin     TOXICITY   Family History  Problem Relation Age of Onset  . Hypertension Mother   . Diabetes Mother   . Hyperlipidemia Mother   . Breast cancer  Mother 67  . Osteoarthritis Mother   . Colon polyps Mother   . Hyperlipidemia Father   . Coronary artery disease Father        MI age 11  . Prostate cancer Father   . Colon polyps Father   . Hyperlipidemia Brother   . Stomach cancer Paternal Grandmother     Current Outpatient Medications (Endocrine & Metabolic):  .  etonogestrel-ethinyl estradiol (NUVARING) 0.12-0.015 MG/24HR vaginal ring, REMOVE AND INSERT A NEW RING INTO THE VAGINA EVERY 4 WEEKS *USE CONTINUOUSLY* .  predniSONE (DELTASONE) 50 MG tablet, Take one tablet daily for the next 5 days.  Current Outpatient Medications (Cardiovascular):  .  fenofibrate 160 MG tablet, Take 1 tablet (160 mg total) by mouth daily. Marland Kitchen  losartan (COZAAR) 50 MG tablet, Take 1 tablet (50 mg total) by mouth  daily.  Current Outpatient Medications (Respiratory):  .  cetirizine (ZYRTEC) 10 MG tablet, Take 1 tablet (10 mg total) by mouth daily. .  montelukast (SINGULAIR) 10 MG tablet, Take 1 tablet (10 mg total) by mouth at bedtime.  Current Outpatient Medications (Analgesics):  .  golimumab 2 mg/kg in sodium chloride 0.9 %, Inject into the vein. Once every 2 months .  Ibuprofen-Famotidine (DUEXIS) 800-26.6 MG TABS, Take 800 mg by mouth 3 (three) times daily. .  Methotrexate 25 MG/ML SOSY, once a week. .  leflunomide (ARAVA) 20 MG tablet, Take 20 mg by mouth daily.   Current Outpatient Medications (Other):  .  ascorbic acid (VITAMIN C) 1000 MG tablet, Take 1,000 mg by mouth daily. .  Cholecalciferol (VITAMIN D3) 125 MCG (5000 UT) CAPS, Take 1 capsule by mouth daily. .  clobetasol ointment (TEMOVATE) 0.05 %, APPLY TO AFFECTED AREA TWICE DAILY FOR 10 DAYS .  escitalopram (LEXAPRO) 20 MG tablet, Take 1 tablet (20 mg total) by mouth daily. Marland Kitchen  esomeprazole (NEXIUM) 40 MG capsule, Take 1 capsule (40 mg total) by mouth daily. .  famotidine (PEPCID) 40 MG tablet, Take 1 tablet (40 mg total) by mouth every evening. Marland Kitchen  MAGNESIUM MALATE PO, Take 1 tablet by mouth at bedtime. .  Multiple Vitamin (MULTIVITAMIN) tablet, Take 1 tablet by mouth daily.   .  Omega-3 Fatty Acids (FISH OIL) 1000 MG CAPS, Take 2 capsules by mouth 2 (two) times daily.  .  Probiotic Product (SOLUBLE FIBER/PROBIOTICS PO), Take 1 capsule by mouth daily.  .  Turmeric 500 MG TABS, Take 1 tablet by mouth 2 (two) times daily. Marland Kitchen  doxycycline (VIBRA-TABS) 100 MG tablet, Take 1 tablet (100 mg total) by mouth 2 (two) times daily.   Reviewed prior external information including notes and imaging from  primary care provider As well as notes that were available from care everywhere and other healthcare systems.  Past medical history, social, surgical and family history all reviewed in electronic medical record.  No pertanent information unless  stated regarding to the chief complaint.   Review of Systems:  No headache, visual changes, nausea, vomiting, diarrhea, constipation, dizziness, abdominal pain, skin rash, fevers, chills, night sweats, weight loss, swollen lymph nodes, body aches, joint swelling, chest pain, shortness of breath, mood changes. POSITIVE muscle aches  Objective  Blood pressure 126/76, pulse 91, height 5\' 10"  (1.778 m), weight 262 lb (118.8 kg), SpO2 97 %.   General: No apparent distress alert and oriented x3 mood and affect normal, dressed appropriately.  HEENT: Pupils equal, extraocular movements intact  Respiratory: Patient's speak in full sentences and does not appear short of breath  Cardiovascular: No lower extremity edema, non tender, no erythema  Neuro: Cranial nerves II through XII are intact, neurovascularly intact in all extremities with 2+ DTRs and 2+ pulses.  Gait normal with good balance and coordination.  MSK:  Non tender with full range of motion and good stability and symmetric strength and tone of shoulders, elbows, wrist, hip, knee and ankles bilaterally.  Back exam shows the patient does have loss of lordosis.  Patient does have more tightness than usual.  Patient has some mild limited range of motion in flexion and extension.  Tightness with Corky Sox test.  Osteopathic findings  C2 flexed rotated and side bent right C4 flexed rotated and side bent left C6 flexed rotated and side bent left T3 extended rotated and side bent right inhaled third rib T9 extended rotated and side bent left L2 flexed rotated and side bent right Sacrum right on right    Impression and Recommendations:     This case required medical decision making of moderate complexity. The above documentation has been reviewed and is accurate and complete Lyndal Pulley, DO       Note: This dictation was prepared with Dragon dictation along with smaller phrase technology. Any transcriptional errors that result from this  process are unintentional.

## 2020-03-09 ENCOUNTER — Ambulatory Visit: Payer: 59 | Admitting: Family Medicine

## 2020-03-09 ENCOUNTER — Other Ambulatory Visit: Payer: Self-pay

## 2020-03-09 ENCOUNTER — Encounter: Payer: Self-pay | Admitting: Family Medicine

## 2020-03-09 VITALS — BP 126/76 | HR 91 | Ht 70.0 in | Wt 262.0 lb

## 2020-03-09 DIAGNOSIS — M62830 Muscle spasm of back: Secondary | ICD-10-CM | POA: Diagnosis not present

## 2020-03-09 DIAGNOSIS — M999 Biomechanical lesion, unspecified: Secondary | ICD-10-CM

## 2020-03-09 MED ORDER — PREDNISONE 50 MG PO TABS: ORAL_TABLET | ORAL | 0 refills | Status: DC

## 2020-03-09 MED FILL — predniSONE 50 MG TABS: 50 | 5 days supply | Qty: 5 | Fill #0

## 2020-03-09 NOTE — Assessment & Plan Note (Signed)
Chronic problem with mild exacerbation.  Social determinants health includes patient with autoimmune disease and does make treatment somewhat difficult especially when it comes to medications.  Patient has been responding fairly well to osteopathic manipulation.  Discussed posture at work and ergonomics when things are appropriate.  Increase activity slowly again.  Medication management discussed including potential transition of antidepressant to Cymbalta or Effexor.  Patient has had mild difficulty with this previously.  Patient is on Humira.  Follow-up again 6 to 8 weeks

## 2020-03-09 NOTE — Patient Instructions (Signed)
See me again in 5 weeks Prednisone 5 days

## 2020-03-09 NOTE — Assessment & Plan Note (Signed)

## 2020-03-13 ENCOUNTER — Encounter: Payer: Self-pay | Admitting: Physician Assistant

## 2020-03-13 MED FILL — ETONOGESTREL-ETHINYL ESTRAD: 0.12-0.015 | 84 days supply | Qty: 3 | Fill #1

## 2020-03-13 MED FILL — FENOFIBRATE 160 MG TABLET: 160 | 90 days supply | Qty: 90 | Fill #3

## 2020-03-13 MED FILL — LOSARTAN POTASSIUM 50 MG TA: 50 | 90 days supply | Qty: 90 | Fill #1

## 2020-03-14 ENCOUNTER — Ambulatory Visit: Payer: 59 | Admitting: Physician Assistant

## 2020-03-22 DIAGNOSIS — R519 Headache, unspecified: Secondary | ICD-10-CM | POA: Diagnosis not present

## 2020-03-22 DIAGNOSIS — G43719 Chronic migraine without aura, intractable, without status migrainosus: Secondary | ICD-10-CM | POA: Diagnosis not present

## 2020-04-05 DIAGNOSIS — Z79899 Other long term (current) drug therapy: Secondary | ICD-10-CM | POA: Diagnosis not present

## 2020-04-05 DIAGNOSIS — M0589 Other rheumatoid arthritis with rheumatoid factor of multiple sites: Secondary | ICD-10-CM | POA: Diagnosis not present

## 2020-04-13 ENCOUNTER — Encounter: Payer: Self-pay | Admitting: Family Medicine

## 2020-04-14 ENCOUNTER — Other Ambulatory Visit: Payer: Self-pay

## 2020-04-14 ENCOUNTER — Ambulatory Visit: Payer: 59 | Admitting: Family Medicine

## 2020-04-14 ENCOUNTER — Encounter: Payer: Self-pay | Admitting: Family Medicine

## 2020-04-14 ENCOUNTER — Ambulatory Visit (INDEPENDENT_AMBULATORY_CARE_PROVIDER_SITE_OTHER): Payer: 59

## 2020-04-14 VITALS — BP 128/76 | HR 97 | Ht 70.0 in | Wt 263.0 lb

## 2020-04-14 DIAGNOSIS — G8929 Other chronic pain: Secondary | ICD-10-CM

## 2020-04-14 DIAGNOSIS — M533 Sacrococcygeal disorders, not elsewhere classified: Secondary | ICD-10-CM

## 2020-04-14 NOTE — Patient Instructions (Addendum)
Injected right SI joint today Continue exercises See me in 4-6 weeks for manipulation

## 2020-04-14 NOTE — Progress Notes (Signed)
[facility] [place] Phone: [phone] Subjective:   April [name], am serving as a scribe for Dr. Hulan Saas. This visit occurred during the SARS-CoV-2 public health emergency.  Safety protocols were in place, including screening questions prior to the visit, additional usage of staff PPE, and extensive cleaning of exam room while observing appropriate contact time as indicated for disinfecting solutions.    I'm seeing this patient by the request  of:  Biagio Borg, MD  CC: Right lower back pain  [id number]  April [name] is a 44 y.o. female coming in with complaint of back pain. Last seen on 03/09/2020 for OMT. Patient would like an SI joint injection today. Patient states right SI joint continues to bother her. Pain relieved for 2 days with prednisone. Denies any radiating pain. Constant pain worse at night when trying to sleep.         Past Medical History:  Diagnosis Date  . ALLERGIC RHINITIS   . Anal fissure   . Anxiety   . ANXIETY DEPRESSION   . CONSTIPATION   . DEPRESSION   . Esophageal reflux   . GERD (gastroesophageal reflux disease)   . HYPERCHOLESTEROLEMIA   . OBESITY   . Occipital neuralgia   . Palpitations   . Rheumatoid arthritis ([place]) 12/24/2017   Past Surgical History:  Procedure Laterality Date  . [name] PAST SURGERIES     Social History   Socioeconomic History  . Marital status: Married    Spouse name: Not on file  . Number of children: 0  . Years of education: Not on file  . Highest education level: Not on file  Occupational History  . Occupation: Pharmacist  Tobacco Use  . Smoking status: Never Smoker  . Smokeless tobacco: Never Used  . Tobacco comment: Married, lives with spouse  Substance and Sexual Activity  . Alcohol use: [name]  . Drug use: [name]  . Sexual activity: Yes    Partners: Male    Birth control/protection: Inserts, None    Comment: 1ST INTERCOURSE- 21,  PARTNERS- 3   Other Topics Concern  . Not on file  Social History Narrative  . Not on file   Social Determinants of Health   Financial Resource Strain:   . Difficulty of Paying Living Expenses:   Food Insecurity:   . Worried About Charity fundraiser in the Last Year:   . Arboriculturist in the Last Year:   Transportation Needs:   . Film/video editor (Medical):   Marland Kitchen Lack of Transportation (Non-Medical):   Physical Activity:   . Days of Exercise per Week:   . Minutes of Exercise per Session:   Stress:   . Feeling of Stress :   Social Connections:   . Frequency of Communication with Friends and Family:   . Frequency of Social Gatherings with Friends and Family:   . Attends Religious Services:   . Active Member of Clubs or Organizations:   . Attends Archivist Meetings:   Marland Kitchen Marital Status:    Allergies  Allergen Reactions  . Ciprofloxacin     toxicity  . Levaquin [Levofloxacin] Anxiety    Possible CNS side effects. TOXICITY  . Moxifloxacin     TOXICITY   Family History  Problem Relation Age of Onset  . Hypertension Mother   . Diabetes Mother   . Hyperlipidemia Mother   . Breast cancer Mother 63  .  Osteoarthritis Mother   . Colon polyps Mother   . Hyperlipidemia Father   . Coronary artery disease Father        MI age 9  . Prostate cancer Father   . Colon polyps Father   . Hyperlipidemia Brother   . Stomach cancer Paternal Grandmother     Current Outpatient Medications (Endocrine & Metabolic):  .  etonogestrel-ethinyl estradiol (NUVARING) 0.12-0.015 MG/24HR vaginal ring, REMOVE AND INSERT A NEW RING INTO THE VAGINA EVERY 4 WEEKS *USE CONTINUOUSLY* .  predniSONE (DELTASONE) 50 MG tablet, Take one tablet daily for the next 5 days.  Current Outpatient Medications (Cardiovascular):  .  fenofibrate 160 MG tablet, Take 1 tablet (160 mg total) by mouth daily. Marland Kitchen  losartan (COZAAR) 50 MG tablet, Take 1 tablet (50 mg total) by mouth daily.  Current  Outpatient Medications (Respiratory):  .  cetirizine (ZYRTEC) 10 MG tablet, Take 1 tablet (10 mg total) by mouth daily. .  montelukast (SINGULAIR) 10 MG tablet, Take 1 tablet (10 mg total) by mouth at bedtime.  Current Outpatient Medications (Analgesics):  .  golimumab 2 mg/kg in sodium chloride 0.9 %, Inject into the vein. Once every 2 months .  Ibuprofen-Famotidine (DUEXIS) 800-26.6 MG TABS, Take 800 mg by mouth 3 (three) times daily. .  Methotrexate 25 MG/ML SOSY, once a week. .  leflunomide (ARAVA) 20 MG tablet, Take 20 mg by mouth daily.   Current Outpatient Medications (Other):  .  ascorbic acid (VITAMIN C) 1000 MG tablet, Take 1,000 mg by mouth daily. .  Cholecalciferol (VITAMIN D3) 125 MCG (5000 UT) CAPS, Take 1 capsule by mouth daily. .  clobetasol ointment (TEMOVATE) 0.05 %, APPLY TO AFFECTED AREA TWICE DAILY FOR 10 DAYS .  escitalopram (LEXAPRO) 20 MG tablet, Take 1 tablet (20 mg total) by mouth daily. Marland Kitchen  esomeprazole (NEXIUM) 40 MG capsule, Take 1 capsule (40 mg total) by mouth daily. .  famotidine (PEPCID) 40 MG tablet, Take 1 tablet (40 mg total) by mouth every evening. Marland Kitchen  MAGNESIUM MALATE PO, Take 1 tablet by mouth at bedtime. .  Multiple Vitamin (MULTIVITAMIN) tablet, Take 1 tablet by mouth daily.   .  Omega-3 Fatty Acids (FISH OIL) 1000 MG CAPS, Take 2 capsules by mouth 2 (two) times daily.  .  Probiotic Product (SOLUBLE FIBER/PROBIOTICS PO), Take 1 capsule by mouth daily.  .  Turmeric 500 MG TABS, Take 1 tablet by mouth 2 (two) times daily. Marland Kitchen  doxycycline (VIBRA-TABS) 100 MG tablet, Take 1 tablet (100 mg total) by mouth 2 (two) times daily.   Reviewed prior external information including notes and imaging from  primary care provider As well as notes that were available from care everywhere and other healthcare systems.  Past medical history, social, surgical and family history all reviewed in electronic medical record.  [name] pertanent information unless stated regarding  to the chief complaint.   Review of Systems:  [name] headache, visual changes, nausea, vomiting, diarrhea, constipation, dizziness, abdominal pain, skin rash, fevers, chills, night sweats, weight loss, swollen lymph nodes, body aches, joint swelling, chest pain, shortness of breath, mood changes. POSITIVE muscle aches  Objective  Blood pressure 128/76, pulse 97, height 5\' 10"  (1.778 m), weight 263 lb (119.3 kg), SpO2 99 %.   General: [name] apparent distress alert and oriented x3 mood and affect normal, dressed appropriately.  HEENT: Pupils equal, extraocular movements intact  Respiratory: Patient's speak in full sentences and does not appear short of breath  Cardiovascular: [name] lower extremity  edema, non tender, [name] erythema  Neuro: Cranial nerves II through XII are intact, neurovascularly intact in all extremities with 2+ DTRs and 2+ pulses.  Gait normal with good balance and coordination.  MSK:  Non tender with full range of motion and good stability and symmetric strength and tone of shoulders, elbows, wrist, hip, knee and ankles bilaterally.  Low back exam shows severe tenderness to palpation over the sacroiliac joint right greater than left.  Positive FABER test.  Mild tightness with straight leg test.  Neurovascular intact distally.  Procedure: Real-time Ultrasound Guided Injection of right sacroiliac joint Device: GE Logiq Q7 Ultrasound guided injection is preferred based studies that show increased duration, increased effect, greater accuracy, decreased procedural pain, increased response rate, and decreased cost with ultrasound guided versus blind injection.  Verbal informed consent obtained.  Time-out conducted.  Noted [name] overlying erythema, induration, or other signs of local infection.  Skin prepped in a sterile fashion.  Local anesthesia: Topical Ethyl chloride.  With sterile technique and under real time ultrasound guidance: With a 22-gauge 3 inch needle injected with 2 cc of 0.5%  Marcaine and 1 cc of Kenalog 40 mg/mL into the right sacroiliac joint Completed without difficulty  Pain immediately resolved suggesting accurate placement of the medication.  Advised to call if fevers/chills, erythema, induration, drainage, or persistent bleeding.  Images permanently stored and available for review in the ultrasound unit.  Impression: Technically successful ultrasound guided injection.   Impression and Recommendations:     This case required medical decision making of moderate complexity. The above documentation has been reviewed and is accurate and complete Lyndal Pulley, DO       Note: This dictation was prepared with Dragon dictation along with smaller phrase technology. Any transcriptional errors that result from this process are unintentional.

## 2020-04-14 NOTE — Assessment & Plan Note (Signed)
Worsening pain at this time.  Chronic issue.  Attempted to do today a injection in the sacroiliac joint that apical be beneficial.  History of rheumatoid arthritis and could be some mild sacroiliitis.  Discussed medications including continuing the over-the-counter medications.  Patient is doing methotrexate weekly has Duexis for breakthrough pain.  Follow-up again in 4 to 6 weeks

## 2020-04-27 ENCOUNTER — Encounter: Payer: Self-pay | Admitting: Family Medicine

## 2020-04-27 ENCOUNTER — Other Ambulatory Visit: Payer: Self-pay

## 2020-04-27 MED ORDER — DUEXIS 800-26.6 MG PO TABS: 800.0000 mg | ORAL_TABLET | Freq: Three times a day (TID) | ORAL | 3 refills | Status: DC

## 2020-05-03 ENCOUNTER — Other Ambulatory Visit (HOSPITAL_COMMUNITY): Payer: Self-pay | Admitting: Physician Assistant

## 2020-05-03 DIAGNOSIS — R21 Rash and other nonspecific skin eruption: Secondary | ICD-10-CM | POA: Diagnosis not present

## 2020-05-03 DIAGNOSIS — Z7189 Other specified counseling: Secondary | ICD-10-CM | POA: Diagnosis not present

## 2020-05-03 DIAGNOSIS — K121 Other forms of stomatitis: Secondary | ICD-10-CM | POA: Diagnosis not present

## 2020-05-03 DIAGNOSIS — E669 Obesity, unspecified: Secondary | ICD-10-CM | POA: Diagnosis not present

## 2020-05-03 DIAGNOSIS — M0589 Other rheumatoid arthritis with rheumatoid factor of multiple sites: Secondary | ICD-10-CM | POA: Diagnosis not present

## 2020-05-03 DIAGNOSIS — L409 Psoriasis, unspecified: Secondary | ICD-10-CM | POA: Diagnosis not present

## 2020-05-03 DIAGNOSIS — M2550 Pain in unspecified joint: Secondary | ICD-10-CM | POA: Diagnosis not present

## 2020-05-03 DIAGNOSIS — Z6838 Body mass index (BMI) 38.0-38.9, adult: Secondary | ICD-10-CM | POA: Diagnosis not present

## 2020-05-03 DIAGNOSIS — Z8709 Personal history of other diseases of the respiratory system: Secondary | ICD-10-CM | POA: Diagnosis not present

## 2020-05-03 MED FILL — GABAPENTIN 300 MG CAPSULE: 300 | 30 days supply | Qty: 30 | Fill #0

## 2020-05-03 MED FILL — CELECOXIB 200 MG CAP: 200 | 30 days supply | Qty: 60 | Fill #0

## 2020-05-16 ENCOUNTER — Encounter: Payer: Self-pay | Admitting: Family Medicine

## 2020-05-16 ENCOUNTER — Other Ambulatory Visit: Payer: Self-pay

## 2020-05-16 ENCOUNTER — Ambulatory Visit: Payer: 59 | Admitting: Family Medicine

## 2020-05-16 VITALS — BP 124/82 | HR 100 | Ht 70.0 in | Wt 264.0 lb

## 2020-05-16 DIAGNOSIS — M533 Sacrococcygeal disorders, not elsewhere classified: Secondary | ICD-10-CM

## 2020-05-16 DIAGNOSIS — M069 Rheumatoid arthritis, unspecified: Secondary | ICD-10-CM

## 2020-05-16 DIAGNOSIS — M999 Biomechanical lesion, unspecified: Secondary | ICD-10-CM

## 2020-05-16 NOTE — Progress Notes (Signed)
April [name] Phone: [phone] Subjective:   [name], am serving as a scribe for Dr. Hulan Saas. This visit occurred during the SARS-CoV-2 public health emergency.  Safety protocols were in place, including screening questions prior to the visit, additional usage of staff PPE, and extensive cleaning of exam room while observing appropriate contact time as indicated for disinfecting solutions.   I'm seeing this patient by the request  of:  April Borg, MD  CC: Back pain follow-up  [id number]  April [name] is a 44 y.o. female coming in with complaint of back and neck pain Patient states that she did not have any relief from the SI joint injection last visit.  Patient continues to have more pain on the sacroiliac joint.  Not making significant strides at this time.  Past medical history is significant for the rheumatoid arthritis  Saw rheumatologist and was put on Celebrex which has helped her pain.           Reviewed prior external information including notes and imaging from previsou exam, outside providers and external EMR if available.   As well as notes that were available from care everywhere and other healthcare systems.  Past medical history, social, surgical and family history all reviewed in electronic medical record.  No pertanent information unless stated regarding to the chief complaint.   Past Medical History:  Diagnosis Date  . ALLERGIC RHINITIS   . Anal fissure   . Anxiety   . ANXIETY DEPRESSION   . CONSTIPATION   . DEPRESSION   . Esophageal reflux   . GERD (gastroesophageal reflux disease)   . HYPERCHOLESTEROLEMIA   . OBESITY   . Occipital neuralgia   . Palpitations   . Rheumatoid arthritis ([place]) 12/24/2017    Allergies  Allergen Reactions  . Ciprofloxacin     toxicity  . Levaquin [Levofloxacin] Anxiety    Possible CNS side effects. TOXICITY  . Moxifloxacin      TOXICITY     Review of Systems:  No headache, visual changes, nausea, vomiting, diarrhea, constipation, dizziness, abdominal pain, skin rash, fevers, chills, night sweats, weight loss, swollen lymph nodes, body aches, joint swelling, chest pain, shortness of breath, mood changes. POSITIVE muscle aches  Objective  There were no vitals taken for this visit.   General: No apparent distress alert and oriented x3 mood and affect normal, dressed appropriately.  HEENT: Pupils equal, extraocular movements intact  Respiratory: Patient's speak in full sentences and does not appear short of breath  Cardiovascular: No lower extremity edema, non tender, no erythema  Neuro: Cranial nerves II through XII are intact, neurovascularly intact in all extremities with 2+ DTRs and 2+ pulses.  Gait normal with good balance and coordination.  MSK:  Non tender with full range of motion and good stability and symmetric strength and tone of shoulders, elbows, wrist, hip, knee and ankles bilaterally.  Back -low back exam still has tightness noted with positive Corky Sox.  Patient has a negative straight leg test.  Patient is tender to palpation mostly over the right sacroiliac joint as well as the paraspinal musculature of the lumbar spine.  Mild tightness in the neck also noted.  Osteopathic findings  C3 flexed rotated and side bent right C6 flexed rotated and side bent left T3 extended rotated and side bent right inhaled rib T9 extended rotated and side bent left L2 flexed rotated and side bent right Sacrum right on  right       Assessment and Plan:  Sacroiliac dysfunction-patient does have the underlying rheumatoid arthritis that is likely contributing as well.  Responded better to manipulation today.  Do not think that the medication should make significant benefit.  Did not respond well to the epidural as well.  Discussed icing regimen and home exercises.  Increase activity slowly.  Follow-up again in 4 to 8  weeks  Nonallopathic problems  Decision today to treat with OMT was based on Physical Exam  After verbal consent patient was treated with HVLA, ME, FPR techniques in cervical, rib, thoracic, lumbar, and sacral  areas  Patient tolerated the procedure well with improvement in symptoms  Patient given exercises, stretches and lifestyle modifications  See medications in patient instructions if given  Patient will follow up in 4-8 weeks      The above documentation has been reviewed and is accurate and complete April Pulley, DO       Note: This dictation was prepared with Dragon dictation along with smaller phrase technology. Any transcriptional errors that result from this process are unintentional.

## 2020-05-16 NOTE — Patient Instructions (Signed)
Write me if you have questions See me again in 6 weeks

## 2020-05-22 ENCOUNTER — Other Ambulatory Visit: Payer: Self-pay | Admitting: Internal Medicine

## 2020-05-22 NOTE — Telephone Encounter (Signed)
Please refill as per office routine med refill policy (all routine meds refilled for 3 mo or monthly per pt preference up to one year from last visit, then month to month grace period for 3 mo, then further med refills will have to be denied)  

## 2020-05-24 ENCOUNTER — Other Ambulatory Visit: Payer: Self-pay | Admitting: Internal Medicine

## 2020-05-24 ENCOUNTER — Ambulatory Visit: Payer: 59 | Admitting: Internal Medicine

## 2020-05-24 ENCOUNTER — Other Ambulatory Visit: Payer: Self-pay

## 2020-05-24 ENCOUNTER — Encounter: Payer: Self-pay | Admitting: Internal Medicine

## 2020-05-24 VITALS — BP 140/86 | HR 90 | Temp 98.2°F | Resp 16 | Ht 70.0 in | Wt 263.0 lb

## 2020-05-24 DIAGNOSIS — L03011 Cellulitis of right finger: Secondary | ICD-10-CM

## 2020-05-24 MED ORDER — OXYCODONE HCL 5 MG PO TABS: 5.0000 mg | ORAL_TABLET | Freq: Four times a day (QID) | ORAL | 0 refills | Status: AC | PRN

## 2020-05-24 MED ORDER — AMOXICILLIN-POT CLAVULANATE 875-125 MG PO TABS: 1.0000 | ORAL_TABLET | Freq: Two times a day (BID) | ORAL | 0 refills | Status: DC

## 2020-05-24 MED ORDER — SULFAMETHOXAZOLE-TRIMETHOPRIM 800-160 MG PO TABS: 1.0000 | ORAL_TABLET | Freq: Two times a day (BID) | ORAL | 0 refills | Status: AC

## 2020-05-24 MED FILL — SULFAMETHOXAZOLE-TMP DS TAB: 800-160 | 7 days supply | Qty: 14 | Fill #0

## 2020-05-24 MED FILL — ESOMEPRAZOLE MAG DR 40 MG C: 40 | 90 days supply | Qty: 90 | Fill #0

## 2020-05-24 MED FILL — oxyCODONE HCL 5 MG TABS: 5 | 4 days supply | Qty: 15 | Fill #0

## 2020-05-24 MED FILL — AMOX-CLAV 875-125 MG TABLET: 875-125 | 10 days supply | Qty: 20 | Fill #0

## 2020-05-24 NOTE — Progress Notes (Signed)
   Subjective:    [name], female    DOB: 03/14/1976, 44 y.o.   MRN: [id number]  HPI    Review of Systems     Objective:   Physical Exam        Assessment & Plan:

## 2020-05-24 NOTE — Patient Instructions (Addendum)
Paronychia Paronychia is an infection of the skin that surrounds a nail. It usually affects the skin around a fingernail, but it may also occur near a toenail. It often causes pain and swelling around the nail. In some cases, a collection of pus (abscess) can form near or under the nail.  This condition may develop suddenly, or it may develop gradually over a longer period. In most cases, paronychia is not serious, and it will clear up with treatment. What are the causes? This condition may be caused by bacteria or a fungus. These germs can enter the body through an opening in the skin, such as a cut or a hangnail. What increases the risk? This condition is more likely to develop in people who:  Get their hands wet often, such as those who work as Designer, industrial/product, bartenders, or nurses.  Bite their fingernails or suck their thumbs.  Trim their nails very short.  Have hangnails or injured fingertips.  Get manicures.  Have diabetes. What are the signs or symptoms? Symptoms of this condition include:  Redness and swelling of the skin near the nail.  Tenderness around the nail when you touch the area.  Pus-filled bumps under the skin at the base and sides of the nail (cuticle).  Fluid or pus under the nail.  Throbbing pain in the area. How is this diagnosed? This condition is diagnosed with a physical exam. In some cases, a sample of pus may be tested to determine what type of bacteria or fungus is causing the condition. How is this treated? Treatment depends on the cause and severity of your condition. If your condition is mild, it may clear up on its own in a few days or after soaking in warm water. If needed, treatment may include:  Antibiotic medicine, if your infection is caused by bacteria.  Antifungal medicine, if your infection is caused by a fungus.  A procedure to drain pus from an abscess.  Anti-inflammatory medicine (corticosteroids). Follow these instructions at  home: Wound care  Keep the affected area clean.  Soak the affected area in warm water, if told to do so by your health care provider. You may be told to do this for 20 minutes, 2-3 times a day.  Keep the area dry when you are not soaking it.  Do not try to drain an abscess yourself.  Follow instructions from your health care provider about how to take care of the affected area. Make sure you: ? Wash your hands with soap and water before you change your bandage (dressing). If soap and water are not available, use hand sanitizer. ? Change your dressing as told by your health care provider.  If you had an abscess drained, check the area every day for signs of infection. Check for: ? Redness, swelling, or pain. ? Fluid or blood. ? Warmth. ? Pus or a bad smell. Medicines   Take over-the-counter and prescription medicines only as told by your health care provider.  If you were prescribed an antibiotic medicine, take it as told by your health care provider. Do not stop taking the antibiotic even if you start to feel better. General instructions  Avoid contact with harsh chemicals.  Do not pick at the affected area. Prevention  To prevent this condition from happening again: ? Wear rubber gloves when washing dishes or doing other tasks that require your hands to get wet. ? Wear gloves if your hands might come in contact with cleaners or other chemicals. ? Avoid  injuring your nails or fingertips. ? Do not bite your nails or tear hangnails. ? Do not cut your nails very short. ? Do not cut your cuticles. ? Use clean nail clippers or scissors when trimming nails. Contact a health care provider if:  Your symptoms get worse or do not improve with treatment.  You have continued or increased fluid, blood, or pus coming from the affected area.  Your finger or knuckle becomes swollen or difficult to move. Get help right away if you have:  A fever or chills.  Redness spreading away  from the affected area.  Joint or muscle pain. Summary  Paronychia is an infection of the skin that surrounds a nail. It often causes pain and swelling around the nail. In some cases, a collection of pus (abscess) can form near or under the nail.  This condition may be caused by bacteria or a fungus. These germs can enter the body through an opening in the skin, such as a cut or a hangnail.  If your condition is mild, it may clear up on its own in a few days. If needed, treatment may include medicine or a procedure to drain pus from an abscess.  To prevent this condition from happening again, wear gloves if doing tasks that require your hands to get wet or to come in contact with chemicals. Also avoid injuring your nails or fingertips. This information is not intended to replace advice given to you by your health care provider. Make sure you discuss any questions you have with your health care provider. Document Revised: 11/21/2017 Document Reviewed: 11/17/2017 Elsevier Patient Education  [employer]. There is no charge for me today  Please take all new medication as prescribed - the antibiotic  You have been seen per Dr Ronnald Ramp for the I&D of the right thumb paronychia  Please continue all other medications as before, and refills have been done if requested.  Please have the pharmacy call with any other refills you may need.  Please keep your appointments with your specialists as you may have planned  You are given the work note today

## 2020-05-24 NOTE — Progress Notes (Addendum)
Subjective:  [name], female    DOB: [birth year]  Age: 44 y.o. MRN: [id number]  CC: Hand Pain  This visit occurred during the SARS-CoV-2 public health emergency.  Safety protocols were in place, including screening questions prior to the visit, additional usage of staff PPE, and extensive cleaning of exam room while observing appropriate contact time as indicated for disinfecting solutions.    HPI April [name] presents for f/up - She complains of a 2-day history of worsening pain, redness, and swelling on the dorsum of her right thumb.  She denies trauma or injury.  Outpatient Medications Prior to Visit  Medication Sig Dispense Refill  . ascorbic acid (VITAMIN C) 1000 MG tablet Take 1,000 mg by mouth daily.    . celecoxib (CELEBREX) 200 MG capsule Take 200 mg by mouth 2 (two) times daily.    . cetirizine (ZYRTEC) 10 MG tablet Take 1 tablet (10 mg total) by mouth daily. 30 tablet 3  . Cholecalciferol (VITAMIN D3) 125 MCG (5000 UT) CAPS Take 1 capsule by mouth daily.    . clobetasol ointment (TEMOVATE) 0.05 % APPLY TO AFFECTED AREA TWICE DAILY FOR 10 DAYS  0  . escitalopram (LEXAPRO) 20 MG tablet Take 1 tablet (20 mg total) by mouth daily. 90 tablet 3  . esomeprazole (NEXIUM) 40 MG capsule TAKE 1 CAPSULE (40 MG TOTAL) BY MOUTH DAILY. 90 capsule 3  . etonogestrel-ethinyl estradiol (NUVARING) 0.12-0.015 MG/24HR vaginal ring REMOVE AND INSERT A NEW RING INTO THE VAGINA EVERY 4 WEEKS *USE CONTINUOUSLY* 3 each 4  . famotidine (PEPCID) 40 MG tablet Take 1 tablet (40 mg total) by mouth every evening. 30 tablet 3  . fenofibrate 160 MG tablet Take 1 tablet (160 mg total) by mouth daily. 90 tablet 3  . folic acid (FOLVITE) 1 MG tablet Take 1 mg by mouth daily.    Marland Kitchen gabapentin (NEURONTIN) 300 MG capsule Take 300 mg by mouth daily.    Marland Kitchen golimumab 2 mg/kg in sodium chloride 0.9 % Inject into the vein. Once every 2 months    . Ibuprofen-Famotidine (DUEXIS) 800-26.6 MG TABS  Take 800 mg by mouth 3 (three) times daily. 90 tablet 3  . losartan (COZAAR) 50 MG tablet Take 1 tablet (50 mg total) by mouth daily. 90 tablet 3  . MAGNESIUM MALATE PO Take 1 tablet by mouth at bedtime.    . Methotrexate 25 MG/ML SOSY once a week.    . methotrexate 50 MG/2ML injection SMARTSIG:1 Milliliter(s) SUB-Q Once a Week    . montelukast (SINGULAIR) 10 MG tablet Take 1 tablet (10 mg total) by mouth at bedtime. 90 tablet 3  . Multiple Vitamin (MULTIVITAMIN) tablet Take 1 tablet by mouth daily.      . Omega-3 Fatty Acids (FISH OIL) 1000 MG CAPS Take 2 capsules by mouth 2 (two) times daily.     . Probiotic Product (SOLUBLE FIBER/PROBIOTICS PO) Take 1 capsule by mouth daily.     . Turmeric 500 MG TABS Take 1 tablet by mouth 2 (two) times daily.    Marland Kitchen doxycycline (VIBRA-TABS) 100 MG tablet Take 1 tablet (100 mg total) by mouth 2 (two) times daily. 20 tablet 0  . leflunomide (ARAVA) 20 MG tablet Take 20 mg by mouth daily.    . predniSONE (DELTASONE) 50 MG tablet Take one tablet daily for the next 5 days. 5 tablet 0   No facility-administered medications prior to visit.    ROS Review of Systems  Constitutional:  Negative for chills, fatigue and fever.  HENT: Negative.   Eyes: Negative.   Respiratory: Negative for cough, chest tightness, shortness of breath and wheezing.   Cardiovascular: Negative for chest pain, palpitations and leg swelling.  Gastrointestinal: Negative for abdominal pain.  Endocrine: Negative.   Genitourinary: Negative.  Negative for difficulty urinating.  Musculoskeletal: Negative.   Skin: Positive for color change.  Hematological: Negative for adenopathy. Does not bruise/bleed easily.  Psychiatric/Behavioral: Negative.     Objective:  BP 140/86 (BP Location: Left Arm, Patient Position: Sitting, Cuff Size: Large)   Pulse 90   Temp 98.2 F (36.8 C) (Oral)   Resp 16   Ht 5\' 10"  (1.778 m)   Wt 263 lb (119.3 kg)   SpO2 97%   BMI 37.74 kg/m   BP Readings from  Last 3 Encounters:  05/26/20 130/90  05/24/20 140/86  05/16/20 124/82    Wt Readings from Last 3 Encounters:  05/26/20 263 lb (119.3 kg)  05/24/20 263 lb (119.3 kg)  05/16/20 264 lb (119.7 kg)    Physical Exam Musculoskeletal:     Right hand: Swelling present.     Comments: Rt thumb -dorsal/ulnar side there is an area of erythema, swelling, and pus that extends from the paronychium through the eponychium and just underneath the fingernail.  There is no evidence of felon.  Distally there is good sensation and capillary refill.     Lab Results  Component Value Date   WBC 5.0 05/26/2020   HGB 12.8 05/26/2020   HCT 39.6 05/26/2020   PLT 377 05/26/2020   GLUCOSE 88 05/26/2020   CHOL 216 (H) 05/26/2020   TRIG 195 (H) 05/26/2020   HDL 70 05/26/2020   LDLDIRECT 96.0 05/26/2019   LDLCALC 115 (H) 05/26/2020   ALT 14 05/26/2020   AST 13 05/26/2020   NA 139 05/26/2020   K 4.5 05/26/2020   CL 103 05/26/2020   CREATININE 0.87 05/26/2020   BUN 14 05/26/2020   CO2 24 05/26/2020   TSH 2.25 05/26/2020   HGBA1C 5.4 05/26/2020    CT Angio Chest PE W and/or Wo Contrast  Result Date: 12/15/2018 CLINICAL DATA:  Chest pain radiating to the left jaw. Dizziness and shortness of breath. Weakness for 2 days. EXAM: CT ANGIOGRAPHY CHEST WITH CONTRAST TECHNIQUE: Multidetector CT imaging of the chest was performed using the standard protocol during bolus administration of intravenous contrast. Multiplanar CT image reconstructions and MIPs were obtained to evaluate the vascular anatomy. CONTRAST:  49mL ISOVUE-370 IOPAMIDOL (ISOVUE-370) INJECTION 76% COMPARISON:  Current chest radiograph. FINDINGS: Cardiovascular: There is satisfactory opacification of the pulmonary arteries to the segmental level. There is no evidence of a pulmonary embolism. Heart is normal in size and configuration. No coronary artery calcifications. No pericardial effusion. Great vessels are normal in caliber. No aortic dissection or  atherosclerosis. Aortic arch branch vessels are widely patent. Mediastinum/Nodes: No enlarged mediastinal, hilar, or axillary lymph nodes. Thyroid gland, trachea, and esophagus demonstrate no significant findings. Lungs/Pleura: Minimal dependent subsegmental atelectasis, most evident in the left lower lobe. Lungs otherwise clear. No pleural effusion or pneumothorax. Upper Abdomen: Unremarkable. Musculoskeletal: No fracture or acute finding. No osteoblastic or osteolytic lesions. Mild midthoracic spine disc degenerative changes. Mild thoracic scoliosis. Review of the MIP images confirms the above findings. IMPRESSION: 1. No evidence of a pulmonary embolism. 2. No acute findings. Electronically Signed   By: Lajean Manes M.D.   On: 12/15/2018 17:45   DG Chest Port 1 View  Result Date: 12/15/2018  CLINICAL DATA:  Shortness of breath since yesterday. Some chest pain. EXAM: PORTABLE CHEST 1 VIEW COMPARISON:  12/20/2016 FINDINGS: Cardiac silhouette is normal in size. No mediastinal or hilar masses. There is no evidence of adenopathy. Prominent bronchovascular markings noted bilaterally. Lungs otherwise clear. No pleural effusion or pneumothorax. Skeletal structures are grossly intact. IMPRESSION: No active disease. Electronically Signed   By: Lajean Manes M.D.   On: 12/15/2018 14:39   After informed verbal consent was obtained. Using Betadine for cleansing over the entire thumb. 2% Lidocaine without epinephrine for anesthetic - a digital block was performed by injecting 1 cc at each quadrant at the base of the thumb.  After about 10 minutes anesthesia was confirmed.  With sterile technique an 11-blade was used to make a small incision.  A moderate amount of pus was extracted.  This was sent for culture.  The small cavity was cleaned with hydrogen peroxide on a Q-tip.  The pus underneath the fingernail has been evacuated.  Hemostasis was obtained. Antibiotic dressing is applied. The specimen is labeled and sent for  culture. The procedure was well tolerated without complications. There is excellent sensation and capillary refill postprocedure.   Assessment & Plan:   April [name] was seen today for hand pain.  Diagnoses and all orders for this visit:  Paronychia of right thumb- Incision and drainage was successfully completed.  Culture is pending - this is likely a staph infection so I recommended a 7-day course of Bactrim DS.  She will soak the finger in hydrogen peroxide and keep it elevated.  She was offered oxycodone as needed for pain.  She agrees to let me know if she develops any new or worsening symptoms. -     sulfamethoxazole-trimethoprim (BACTRIM DS) 800-160 MG tablet; Take 1 tablet by mouth 2 (two) times daily for 7 days. -     oxyCODONE (OXY IR/ROXICODONE) 5 MG immediate release tablet; Take 1 tablet (5 mg total) by mouth every 6 (six) hours as needed for up to 3 days for severe pain. -     Wound culture; Future -     Wound culture  Other orders -     Discontinue: amoxicillin-clavulanate (AUGMENTIN) 875-125 MG tablet; Take 1 tablet by mouth 2 (two) times daily.   I have discontinued April [name]'s leflunomide, doxycycline, predniSONE, and amoxicillin-clavulanate. I am also having her start on sulfamethoxazole-trimethoprim and oxyCODONE. Additionally, I am having her maintain her multivitamin, cetirizine, Fish Oil, Probiotic Product (SOLUBLE FIBER/PROBIOTICS PO), ascorbic acid, clobetasol ointment, escitalopram, montelukast, fenofibrate, Vitamin D3, Turmeric, MAGNESIUM MALATE PO, famotidine, golimumab 2 mg/kg in sodium chloride 0.9 %, etonogestrel-ethinyl estradiol, Methotrexate, losartan, Duexis, esomeprazole, celecoxib, folic acid, gabapentin, and methotrexate.  Meds ordered this encounter  Medications  . DISCONTD: amoxicillin-clavulanate (AUGMENTIN) 875-125 MG tablet    Sig: Take 1 tablet by mouth 2 (two) times daily.    Dispense:  20 tablet    Refill:  0  .  sulfamethoxazole-trimethoprim (BACTRIM DS) 800-160 MG tablet    Sig: Take 1 tablet by mouth 2 (two) times daily for 7 days.    Dispense:  14 tablet    Refill:  0  . oxyCODONE (OXY IR/ROXICODONE) 5 MG immediate release tablet    Sig: Take 1 tablet (5 mg total) by mouth every 6 (six) hours as needed for up to 3 days for severe pain.    Dispense:  15 tablet    Refill:  0     Follow-up: Return in about 1 week (around  05/31/2020).  Scarlette Calico, MD

## 2020-05-26 ENCOUNTER — Telehealth: Payer: Self-pay | Admitting: Emergency Medicine

## 2020-05-26 ENCOUNTER — Encounter: Payer: Self-pay | Admitting: Internal Medicine

## 2020-05-26 ENCOUNTER — Other Ambulatory Visit: Payer: Self-pay

## 2020-05-26 ENCOUNTER — Ambulatory Visit (INDEPENDENT_AMBULATORY_CARE_PROVIDER_SITE_OTHER): Payer: 59 | Admitting: Internal Medicine

## 2020-05-26 VITALS — BP 130/90 | HR 96 | Temp 99.0°F | Ht 70.0 in | Wt 263.0 lb

## 2020-05-26 DIAGNOSIS — R739 Hyperglycemia, unspecified: Secondary | ICD-10-CM

## 2020-05-26 DIAGNOSIS — Z0184 Encounter for antibody response examination: Secondary | ICD-10-CM

## 2020-05-26 DIAGNOSIS — E538 Deficiency of other specified B group vitamins: Secondary | ICD-10-CM | POA: Diagnosis not present

## 2020-05-26 DIAGNOSIS — Z0000 Encounter for general adult medical examination without abnormal findings: Secondary | ICD-10-CM

## 2020-05-26 DIAGNOSIS — Z1159 Encounter for screening for other viral diseases: Secondary | ICD-10-CM | POA: Diagnosis not present

## 2020-05-26 DIAGNOSIS — L03011 Cellulitis of right finger: Secondary | ICD-10-CM | POA: Diagnosis not present

## 2020-05-26 DIAGNOSIS — Z0001 Encounter for general adult medical examination with abnormal findings: Secondary | ICD-10-CM

## 2020-05-26 DIAGNOSIS — E559 Vitamin D deficiency, unspecified: Secondary | ICD-10-CM | POA: Diagnosis not present

## 2020-05-26 LAB — SARS-COV-2 IGG: SARS-COV-2 IgG: 0.39

## 2020-05-26 NOTE — Telephone Encounter (Signed)
Ok with me 

## 2020-05-26 NOTE — Assessment & Plan Note (Signed)
Ok for sars IgG testing

## 2020-05-26 NOTE — Telephone Encounter (Signed)
Pt is requesting to transfer care from Dr Jenny Reichmann to Dr Ronnald Ramp. Are you both ok with the switch?

## 2020-05-26 NOTE — Assessment & Plan Note (Signed)
stable overall by history and exam, recent data reviewed with pt, and pt to continue medical treatment as before,  to f/u any worsening symptoms or concerns  

## 2020-05-26 NOTE — Progress Notes (Signed)
Subjective:    [name], female    DOB: 08/17/1976, 44 y.o.   MRN: [id number]  HPI  Here for wellness and f/u;  Overall doing ok;  Pt denies Chest pain, worsening SOB, DOE, wheezing, orthopnea, PND, worsening LE edema, palpitations, dizziness or syncope.  Pt denies neurological change such as new headache, facial or extremity weakness.  Pt denies polydipsia, polyuria, or low sugar symptoms. Pt states overall good compliance with treatment and medications, good tolerability, and has been trying to follow appropriate diet.  Pt denies worsening depressive symptoms, suicidal ideation or panic. No fever, night sweats, wt loss, loss of appetite, or other constitutional symptoms.  Pt states good ability with ADL's, has low fall risk, home safety reviewed and adequate, no other significant changes in hearing or vision, and only occasionally active with exercise. S/p I&D right thumb paronychia x 2 days doing very well today.  Asks for sars2 Igg testing due to haing vaccine feb 2021 but also on immunotherapy and possible variable immune response Past Medical History:  Diagnosis Date   ALLERGIC RHINITIS    Anal fissure    Anxiety    ANXIETY DEPRESSION    CONSTIPATION    DEPRESSION    Esophageal reflux    GERD (gastroesophageal reflux disease)    HYPERCHOLESTEROLEMIA    OBESITY    Occipital neuralgia    Palpitations    Rheumatoid arthritis ([place]) 12/24/2017   Past Surgical History:  Procedure Laterality Date   NO PAST SURGERIES      reports that she has never smoked. She has never used smokeless tobacco. She reports that she does not drink alcohol and does not use drugs. family history includes Breast cancer (age of onset: 67) in her mother; Colon polyps in her father and mother; Coronary artery disease in her father; Diabetes in her mother; Hyperlipidemia in her brother, father, and mother; Hypertension in her mother; Osteoarthritis in her mother; Prostate cancer in  her father; Stomach cancer in her paternal grandmother. Allergies  Allergen Reactions   Ciprofloxacin     toxicity   Levaquin [Levofloxacin] Anxiety    Possible CNS side effects. TOXICITY   Moxifloxacin     TOXICITY   Current Outpatient Medications on File Prior to Visit  Medication Sig Dispense Refill   ascorbic acid (VITAMIN C) 1000 MG tablet Take 1,000 mg by mouth daily.     busPIRone (BUSPAR) 15 MG tablet Take 15 mg by mouth 2 (two) times daily.     celecoxib (CELEBREX) 200 MG capsule Take 200 mg by mouth 2 (two) times daily.     cetirizine (ZYRTEC) 10 MG tablet Take 1 tablet (10 mg total) by mouth daily. 30 tablet 3   Cholecalciferol (VITAMIN D3) 125 MCG (5000 UT) CAPS Take 1 capsule by mouth daily.     clobetasol ointment (TEMOVATE) 0.05 % APPLY TO AFFECTED AREA TWICE DAILY FOR 10 DAYS  0   escitalopram (LEXAPRO) 20 MG tablet Take 1 tablet (20 mg total) by mouth daily. 90 tablet 3   esomeprazole (NEXIUM) 40 MG capsule TAKE 1 CAPSULE (40 MG TOTAL) BY MOUTH DAILY. 90 capsule 3   etonogestrel-ethinyl estradiol (NUVARING) 0.12-0.015 MG/24HR vaginal ring REMOVE AND INSERT A NEW RING INTO THE VAGINA EVERY 4 WEEKS *USE CONTINUOUSLY* 3 each 4   famotidine (PEPCID) 40 MG tablet Take 1 tablet (40 mg total) by mouth every evening. 30 tablet 3   fenofibrate 160 MG tablet Take 1 tablet (160 mg total) by  mouth daily. 90 tablet 3   folic acid (FOLVITE) 1 MG tablet Take 1 mg by mouth daily.     gabapentin (NEURONTIN) 300 MG capsule Take 300 mg by mouth daily.     golimumab 2 mg/kg in sodium chloride 0.9 % Inject into the vein. Once every 2 months     Ibuprofen-Famotidine (DUEXIS) 800-26.6 MG TABS Take 800 mg by mouth 3 (three) times daily. 90 tablet 3   losartan (COZAAR) 50 MG tablet Take 1 tablet (50 mg total) by mouth daily. 90 tablet 3   MAGNESIUM MALATE PO Take 1 tablet by mouth at bedtime.     Methotrexate 25 MG/ML SOSY once a week.     methotrexate 50 MG/2ML injection  SMARTSIG:1 Milliliter(s) SUB-Q Once a Week     montelukast (SINGULAIR) 10 MG tablet Take 1 tablet (10 mg total) by mouth at bedtime. 90 tablet 3   Multiple Vitamin (MULTIVITAMIN) tablet Take 1 tablet by mouth daily.       Omega-3 Fatty Acids (FISH OIL) 1000 MG CAPS Take 2 capsules by mouth 2 (two) times daily.      oxyCODONE (OXY IR/ROXICODONE) 5 MG immediate release tablet Take 1 tablet (5 mg total) by mouth every 6 (six) hours as needed for up to 3 days for severe pain. 15 tablet 0   Probiotic Product (SOLUBLE FIBER/PROBIOTICS PO) Take 1 capsule by mouth daily.      sulfamethoxazole-trimethoprim (BACTRIM DS) 800-160 MG tablet Take 1 tablet by mouth 2 (two) times daily for 7 days. 14 tablet 0   Turmeric 500 MG TABS Take 1 tablet by mouth 2 (two) times daily.     No current facility-administered medications on file prior to visit.   Review of Systems All otherwise neg per pt     Objective:   Physical Exam BP 130/90 (BP Location: Left Arm, Patient Position: Sitting, Cuff Size: Large)    Pulse 96    Temp 99 F (37.2 C) (Oral)    Ht 5\' 10"  (1.778 m)    Wt 263 lb (119.3 kg)    SpO2 97%    BMI 37.74 kg/m  VS noted,  Constitutional: Pt appears in NAD HENT: Head: NCAT.  Right Ear: External ear normal.  Left Ear: External ear normal.  Eyes: . Pupils are equal, round, and reactive to light. Conjunctivae and EOM are normal Nose: without d/c or deformity Neck: Neck supple. Gross normal ROM Cardiovascular: Normal rate and regular rhythm.   Pulmonary/Chest: Effort normal and breath sounds without rales or wheezing.  Abd:  Soft, NT, ND, + BS, no organomegaly Neurological: Pt is alert. At baseline orientation, motor grossly intact Skin: Skin is warm. No rashes, other new lesions, no LE edema Psychiatric: Pt behavior is normal without agitation  ,All otherwise neg per pt Lab Results  Component Value Date   WBC 9.2 12/15/2018   HGB 13.0 12/15/2018   HCT 41.5 12/15/2018   PLT 463 (H)  12/15/2018   GLUCOSE 127 (H) 12/15/2018   CHOL 205 (H) 05/26/2019   TRIG 391.0 (H) 05/26/2019   HDL 47.50 05/26/2019   LDLDIRECT 96.0 05/26/2019   LDLCALC 110 (H) 05/22/2018   ALT 21 12/15/2018   AST 25 12/15/2018   NA 136 12/15/2018   K 3.5 12/15/2018   CL 106 12/15/2018   CREATININE 0.76 12/15/2018   BUN 11 12/15/2018   CO2 18 (L) 12/15/2018   TSH 1.28 05/26/2019   HGBA1C 5.5 05/26/2019      Assessment &  Plan:

## 2020-05-26 NOTE — Assessment & Plan Note (Signed)
Improved, cont same tx 

## 2020-05-26 NOTE — Patient Instructions (Signed)

## 2020-05-26 NOTE — Assessment & Plan Note (Signed)

## 2020-05-26 NOTE — Addendum Note (Signed)
Addended by: Cresenciano Lick on: 05/26/2020 08:40 AM   Modules accepted: Orders

## 2020-05-27 LAB — BASIC METABOLIC PANEL
BUN: 14 mg/dL (ref 7–25)
CO2: 24 mmol/L (ref 20–32)
Calcium: 10.1 mg/dL (ref 8.6–10.2)
Chloride: 103 mmol/L (ref 98–110)
Creat: 0.87 mg/dL (ref 0.50–1.10)
Glucose, Bld: 88 mg/dL (ref 65–99)
Potassium: 4.5 mmol/L (ref 3.5–5.3)
Sodium: 139 mmol/L (ref 135–146)

## 2020-05-27 LAB — CBC WITH DIFFERENTIAL/PLATELET
Absolute Monocytes: 700 cells/uL (ref 200–950)
Basophils Absolute: 30 cells/uL (ref 0–200)
Basophils Relative: 0.6 %
Eosinophils Absolute: 240 cells/uL (ref 15–500)
Eosinophils Relative: 4.8 %
HCT: 39.6 % (ref 35.0–45.0)
Hemoglobin: 12.8 g/dL (ref 11.7–15.5)
Lymphs Abs: 1010 cells/uL (ref 850–3900)
MCH: 29.2 pg (ref 27.0–33.0)
MCHC: 32.3 g/dL (ref 32.0–36.0)
MCV: 90.4 fL (ref 80.0–100.0)
MPV: 9.4 fL (ref 7.5–12.5)
Monocytes Relative: 14 %
Neutro Abs: 3020 cells/uL (ref 1500–7800)
Neutrophils Relative %: 60.4 %
Platelets: 377 10*3/uL (ref 140–400)
RBC: 4.38 10*6/uL (ref 3.80–5.10)
RDW: 13.1 % (ref 11.0–15.0)
Total Lymphocyte: 20.2 %
WBC: 5 10*3/uL (ref 3.8–10.8)

## 2020-05-27 LAB — URINALYSIS, ROUTINE W REFLEX MICROSCOPIC
Bilirubin Urine: NEGATIVE
Glucose, UA: NEGATIVE
Hgb urine dipstick: NEGATIVE
Ketones, ur: NEGATIVE
Leukocytes,Ua: NEGATIVE
Nitrite: NEGATIVE
Protein, ur: NEGATIVE
Specific Gravity, Urine: 1.021 (ref 1.001–1.03)
pH: 6.5 (ref 5.0–8.0)

## 2020-05-27 LAB — HEPATIC FUNCTION PANEL
AG Ratio: 1.1 (calc) (ref 1.0–2.5)
ALT: 14 U/L (ref 6–29)
AST: 13 U/L (ref 10–30)
Albumin: 4 g/dL (ref 3.6–5.1)
Alkaline phosphatase (APISO): 64 U/L (ref 31–125)
Bilirubin, Direct: 0.1 mg/dL (ref 0.0–0.2)
Globulin: 3.5 g/dL (calc) (ref 1.9–3.7)
Indirect Bilirubin: 0.2 mg/dL (calc) (ref 0.2–1.2)
Total Bilirubin: 0.3 mg/dL (ref 0.2–1.2)
Total Protein: 7.5 g/dL (ref 6.1–8.1)

## 2020-05-27 LAB — LIPID PANEL
Cholesterol: 216 mg/dL — ABNORMAL HIGH (ref <200)
HDL: 70 mg/dL (ref >=50)
LDL Cholesterol (Calc): 115 mg/dL (calc) — ABNORMAL HIGH
Non-HDL Cholesterol (Calc): 146 mg/dL (calc) — ABNORMAL HIGH (ref <130)
Total CHOL/HDL Ratio: 3.1 (calc) (ref <5.0)
Triglycerides: 195 mg/dL — ABNORMAL HIGH (ref <150)

## 2020-05-27 LAB — HEMOGLOBIN A1C
Hgb A1c MFr Bld: 5.4 % of total Hgb (ref <5.7)
Mean Plasma Glucose: 108 (calc)
eAG (mmol/L): 6 (calc)

## 2020-05-27 LAB — WOUND CULTURE: Gram Stain:: NONE SEEN

## 2020-05-27 LAB — TSH: TSH: 2.25 mIU/L

## 2020-05-27 LAB — VITAMIN D 25 HYDROXY (VIT D DEFICIENCY, FRACTURES): Vit D, 25-Hydroxy: 59 ng/mL (ref 30–100)

## 2020-05-27 LAB — VITAMIN B12: Vitamin B-12: 413 pg/mL (ref 200–1100)

## 2020-05-29 ENCOUNTER — Encounter: Payer: Self-pay | Admitting: Internal Medicine

## 2020-05-29 LAB — HEPATITIS C ANTIBODY
Hepatitis C Ab: NONREACTIVE
SIGNAL TO CUT-OFF: 0.03 (ref <1.00)

## 2020-05-29 NOTE — Telephone Encounter (Signed)
Called patient and left VM for patient to call back and schedule transfer of care appt.

## 2020-05-31 MED FILL — ETONOGESTREL-ETHINYL ESTRAD: 0.12-0.015 | 84 days supply | Qty: 3 | Fill #2

## 2020-06-07 DIAGNOSIS — M0589 Other rheumatoid arthritis with rheumatoid factor of multiple sites: Secondary | ICD-10-CM | POA: Diagnosis not present

## 2020-06-07 DIAGNOSIS — Z79899 Other long term (current) drug therapy: Secondary | ICD-10-CM | POA: Diagnosis not present

## 2020-06-08 MED FILL — LOSARTAN POTASSIUM 50 MG TA: 50 | 90 days supply | Qty: 90 | Fill #2

## 2020-06-19 ENCOUNTER — Other Ambulatory Visit: Payer: Self-pay | Admitting: Internal Medicine

## 2020-06-19 MED FILL — GABAPENTIN 300 MG CAPSULE: 300 | 90 days supply | Qty: 90 | Fill #0

## 2020-06-19 NOTE — Telephone Encounter (Signed)
Please refill as per office routine med refill policy (all routine meds refilled for 3 mo or monthly per pt preference up to one year from last visit, then month to month grace period for 3 mo, then further med refills will have to be denied)  

## 2020-06-20 ENCOUNTER — Other Ambulatory Visit: Payer: Self-pay | Admitting: Internal Medicine

## 2020-06-20 MED FILL — FENOFIBRATE 160 MG TABLET: 160 | 90 days supply | Qty: 90 | Fill #0

## 2020-06-27 ENCOUNTER — Encounter: Payer: Self-pay | Admitting: Family Medicine

## 2020-06-27 ENCOUNTER — Ambulatory Visit (INDEPENDENT_AMBULATORY_CARE_PROVIDER_SITE_OTHER): Payer: 59

## 2020-06-27 ENCOUNTER — Other Ambulatory Visit: Payer: Self-pay

## 2020-06-27 ENCOUNTER — Ambulatory Visit: Payer: 59 | Admitting: Family Medicine

## 2020-06-27 VITALS — BP 124/78 | HR 96 | Ht 70.0 in | Wt 264.0 lb

## 2020-06-27 DIAGNOSIS — G8929 Other chronic pain: Secondary | ICD-10-CM

## 2020-06-27 DIAGNOSIS — M546 Pain in thoracic spine: Secondary | ICD-10-CM

## 2020-06-27 DIAGNOSIS — M999 Biomechanical lesion, unspecified: Secondary | ICD-10-CM | POA: Diagnosis not present

## 2020-06-27 DIAGNOSIS — M533 Sacrococcygeal disorders, not elsewhere classified: Secondary | ICD-10-CM

## 2020-06-27 DIAGNOSIS — M545 Low back pain, unspecified: Secondary | ICD-10-CM

## 2020-06-27 DIAGNOSIS — M47816 Spondylosis without myelopathy or radiculopathy, lumbar region: Secondary | ICD-10-CM | POA: Diagnosis not present

## 2020-06-27 DIAGNOSIS — M2550 Pain in unspecified joint: Secondary | ICD-10-CM

## 2020-06-27 NOTE — Assessment & Plan Note (Signed)
Attempted injection at last exam 2 months ago.  Patient states some improvement when he comes to be deep pain.  Continue L5 low back pain.

## 2020-06-27 NOTE — Assessment & Plan Note (Signed)
Continued back pain.  Discussed which activities to increase activity slowly.  Discussed icing regimen.  Discussed with her things to do that I think will be beneficial.  Discussed medication management including Celebrex and vitamin D.  Has Duexis for breakthrough pain and gabapentin at night.  Follow-up again in 4 to 8 weeks

## 2020-06-27 NOTE — Patient Instructions (Signed)
Lab and xray today See me again in 6-7 weeks

## 2020-06-27 NOTE — Progress Notes (Signed)
April [name] April [name]: [phone] Subjective:   [name], am serving as a scribe for Dr. Hulan Saas. This visit occurred during the SARS-CoV-2 public health emergency.  Safety protocols were in place, including screening questions prior to the visit, additional usage of staff PPE, and extensive cleaning of exam room while observing appropriate contact time as indicated for disinfecting solutions.   I'm seeing this patient by the request  of:  Biagio Borg, MD  CC: Back and neck pain follow-up  HCW:[id number]  April [name] is a 44 y.o. female coming in with complaint of back and neck pain. OMT 05/16/2020. Patient states that she continues to have pain in SI joints.  Patient has seen rheumatologist who did bring up the idea of ankylosing spondylitis.  Patient does feel that the injection has helped out significantly but still has pain.  Patient still does not feel like she is back at her baseline yet.  Due to her rheumatoid arthritis patient would like to have a check her antibody levels.  Medications patient has been prescribed: Gabapentin, Duexis  Taking: Yes         Reviewed prior external information including notes and imaging from previsou exam, outside providers and external EMR if available.   As well as notes that were available from care everywhere and other healthcare systems.  Past medical history, social, surgical and family history all reviewed in electronic medical record.  No pertanent information unless stated regarding to the chief complaint.   Past Medical History:  Diagnosis Date  . ALLERGIC RHINITIS   . Anal fissure   . Anxiety   . ANXIETY DEPRESSION   . CONSTIPATION   . DEPRESSION   . Esophageal reflux   . GERD (gastroesophageal reflux disease)   . HYPERCHOLESTEROLEMIA   . OBESITY   . Occipital neuralgia   . Palpitations   . Rheumatoid arthritis ([place]) 12/24/2017      Allergies  Allergen Reactions  . Ciprofloxacin     toxicity  . Levaquin [Levofloxacin] Anxiety    Possible CNS side effects. TOXICITY  . Moxifloxacin     TOXICITY     Review of Systems:  No headache, visual changes, nausea, vomiting, diarrhea, constipation, dizziness, abdominal pain, skin rash, fevers, chills, night sweats, weight loss, swollen lymph nodes, body aches, joint swelling, chest pain, shortness of breath, mood changes. POSITIVE muscle aches  Objective  There were no vitals taken for this visit.   General: No apparent distress alert and oriented x3 mood and affect normal, dressed appropriately.  HEENT: Pupils equal, extraocular movements intact  Respiratory: Patient's speak in full sentences and does not appear short of breath  Cardiovascular: No lower extremity edema, non tender, no erythema  Neuro: Cranial nerves II through XII are intact, neurovascularly intact in all extremities with 2+ DTRs and 2+ pulses.  Gait normal with good balance and coordination.  MSK:  Non tender with full range of motion and good stability and symmetric strength and tone of shoulders, elbows, wrist, hip, knee and ankles bilaterally.  Back - Normal skin, Spine with normal alignment and no deformity.  No tenderness to vertebral process palpation.  Paraspinous muscles are not tender and without spasm.   Range of motion is full at neck and lumbar sacral regions  Osteopathic findings  C2 flexed rotated and side bent right C6 flexed rotated and side bent left T3 extended rotated and side bent right inhaled rib  T6 extended rotated and side bent left L2 flexed rotated and side bent right Sacrum right on right       Assessment and Plan:  Acute left-sided thoracic back pain Continued back pain.  Discussed which activities to increase activity slowly.  Discussed icing regimen.  Discussed with her things to do that I think will be beneficial.  Discussed medication management including Celebrex  and vitamin D.  Has Duexis for breakthrough pain and gabapentin at night.  Follow-up again in 4 to 8 weeks  SI (sacroiliac) joint dysfunction Attempted injection at last exam 2 months ago.  Patient states some improvement when he comes to be deep pain.  Continue L5 low back pain.    Nonallopathic problems  Decision today to treat with OMT was based on Physical Exam  After verbal consent patient was treated with HVLA, ME, FPR techniques in cervical, rib, thoracic, lumbar, and sacral  areas  Patient tolerated the procedure well with improvement in symptoms  Patient given exercises, stretches and lifestyle modifications  See medications in patient instructions if given  Patient will follow up in 4-8 weeks      The above documentation has been reviewed and is accurate and complete Lyndal Pulley, DO       Note: This dictation was prepared with Dragon dictation along with smaller phrase technology. Any transcriptional errors that result from this process are unintentional.

## 2020-06-28 LAB — SARS-COV-2 ANTIBODY(IGG)SPIKE,SEMI-QUANTITATIVE: SARS COV1 AB(IGG)SPIKE,SEMI QN: <1 index (ref <1.00)

## 2020-06-28 LAB — SARS COV-2 SEROLOGY(COVID-19)AB(IGG,IGM),IMMUNOASSAY
SARS CoV-2 AB IgG: NEGATIVE
SARS CoV-2 IgM: NEGATIVE

## 2020-07-20 DIAGNOSIS — H04123 Dry eye syndrome of bilateral lacrimal glands: Secondary | ICD-10-CM | POA: Diagnosis not present

## 2020-07-20 DIAGNOSIS — M0570 Rheumatoid arthritis with rheumatoid factor of unspecified site without organ or systems involvement: Secondary | ICD-10-CM | POA: Diagnosis not present

## 2020-07-20 DIAGNOSIS — H18459 Nodular corneal degeneration, unspecified eye: Secondary | ICD-10-CM | POA: Diagnosis not present

## 2020-07-20 DIAGNOSIS — H10413 Chronic giant papillary conjunctivitis, bilateral: Secondary | ICD-10-CM | POA: Diagnosis not present

## 2020-07-20 MED FILL — PREDNISOLONE AC 1% EYE DROP: 1 | 25 days supply | Qty: 5 | Fill #0

## 2020-07-26 MED FILL — valACYclovir HCL 1 GM TABS: 1 | 1 days supply | Qty: 4 | Fill #0

## 2020-07-31 ENCOUNTER — Other Ambulatory Visit: Payer: Self-pay | Admitting: Internal Medicine

## 2020-07-31 ENCOUNTER — Other Ambulatory Visit: Payer: Self-pay | Admitting: Physician Assistant

## 2020-07-31 MED FILL — FOLIC ACID 1 MG TABS: 1 | 90 days supply | Qty: 90 | Fill #0

## 2020-07-31 NOTE — Telephone Encounter (Signed)
Sent to Dr. John. 

## 2020-07-31 NOTE — Telephone Encounter (Signed)
Done erx 

## 2020-08-01 ENCOUNTER — Other Ambulatory Visit (HOSPITAL_COMMUNITY): Payer: Self-pay | Admitting: Physician Assistant

## 2020-08-01 MED FILL — ESCITALOPRAM 20 MG TABLET: 20 | 90 days supply | Qty: 90 | Fill #0

## 2020-08-01 MED FILL — METHOTREXATE 25 MG/ML VIAL: 50 | 84 days supply | Qty: 12 | Fill #0

## 2020-08-02 ENCOUNTER — Other Ambulatory Visit: Payer: Self-pay

## 2020-08-02 ENCOUNTER — Ambulatory Visit: Payer: 59 | Admitting: Dermatology

## 2020-08-02 ENCOUNTER — Encounter: Payer: Self-pay | Admitting: Dermatology

## 2020-08-02 DIAGNOSIS — L732 Hidradenitis suppurativa: Secondary | ICD-10-CM

## 2020-08-02 MED ORDER — TRIAMCINOLONE ACETONIDE 0.1 % EX CREA
1.0000 | TOPICAL_CREAM | Freq: Every day | CUTANEOUS | 2 refills | Status: DC | PRN
Start: 2020-08-02 — End: 2020-08-02

## 2020-08-02 MED ORDER — TRIAMCINOLONE ACETONIDE 0.1 % EX CREA
1.0000 | TOPICAL_CREAM | Freq: Every day | CUTANEOUS | 3 refills | Status: DC | PRN
Start: 2020-08-02 — End: 2020-08-02

## 2020-08-02 MED FILL — TRIAMCINOLONE 0.1% CREAM: 0.1 | 30 days supply | Qty: 80 | Fill #0

## 2020-08-03 DIAGNOSIS — Z79899 Other long term (current) drug therapy: Secondary | ICD-10-CM | POA: Diagnosis not present

## 2020-08-03 DIAGNOSIS — M0589 Other rheumatoid arthritis with rheumatoid factor of multiple sites: Secondary | ICD-10-CM | POA: Diagnosis not present

## 2020-08-07 DIAGNOSIS — L409 Psoriasis, unspecified: Secondary | ICD-10-CM | POA: Diagnosis not present

## 2020-08-07 DIAGNOSIS — M0589 Other rheumatoid arthritis with rheumatoid factor of multiple sites: Secondary | ICD-10-CM | POA: Diagnosis not present

## 2020-08-07 DIAGNOSIS — E669 Obesity, unspecified: Secondary | ICD-10-CM | POA: Diagnosis not present

## 2020-08-07 DIAGNOSIS — R21 Rash and other nonspecific skin eruption: Secondary | ICD-10-CM | POA: Diagnosis not present

## 2020-08-07 DIAGNOSIS — Z8709 Personal history of other diseases of the respiratory system: Secondary | ICD-10-CM | POA: Diagnosis not present

## 2020-08-07 DIAGNOSIS — Z6838 Body mass index (BMI) 38.0-38.9, adult: Secondary | ICD-10-CM | POA: Diagnosis not present

## 2020-08-07 DIAGNOSIS — Z7189 Other specified counseling: Secondary | ICD-10-CM | POA: Diagnosis not present

## 2020-08-07 DIAGNOSIS — K121 Other forms of stomatitis: Secondary | ICD-10-CM | POA: Diagnosis not present

## 2020-08-07 DIAGNOSIS — M2550 Pain in unspecified joint: Secondary | ICD-10-CM | POA: Diagnosis not present

## 2020-08-09 ENCOUNTER — Ambulatory Visit: Payer: 59 | Admitting: Family Medicine

## 2020-08-15 NOTE — Progress Notes (Signed)
[place] Phone: [phone] Subjective:   April April [name], am serving as a scribe for Dr. Hulan Saas. This visit occurred during the SARS-CoV-2 public health emergency.  Safety protocols were in place, including screening questions prior to the visit, additional usage of staff PPE, and extensive cleaning of exam room while observing appropriate contact time as indicated for disinfecting solutions.   I'm seeing this patient by the request  of:  Biagio Borg, MD  CC: Low back pain follow-up  [id number]  April April [name] is a 44 y.o. female coming in with complaint of back and neck pain. OMT 810/2021. Patient states that pain in her SI joints have increased with lying down and standing still. Rheumatology recommended taking Celebrex daily. She started this regimen last week.   Taking: Transition to Celebrex         Reviewed prior external information including notes and imaging from previsou exam, outside providers and external EMR if available.   As well as notes that were available from care everywhere and other healthcare systems.  Past medical history, social, surgical and family history all reviewed in electronic medical record.  April [name] pertanent information unless stated regarding to the chief complaint.   Past Medical History:  Diagnosis Date  . ALLERGIC RHINITIS   . Anal fissure   . Anxiety   . ANXIETY DEPRESSION   . CONSTIPATION   . DEPRESSION   . Esophageal reflux   . GERD (gastroesophageal reflux disease)   . HYPERCHOLESTEROLEMIA   . OBESITY   . Occipital neuralgia   . Palpitations   . Rheumatoid arthritis ([place]) 12/24/2017    Allergies  Allergen Reactions  . Ciprofloxacin     toxicity  . Levaquin [Levofloxacin] Anxiety    Possible CNS side effects. TOXICITY  . Moxifloxacin     TOXICITY     Review of Systems:  April [name] headache, visual changes, nausea, vomiting, diarrhea,  constipation, dizziness, abdominal pain, skin rash, fevers, chills, night sweats, weight loss, swollen lymph nodes, body aches, joint swelling, chest pain, shortness of breath, mood changes. POSITIVE muscle aches  Objective  Blood pressure 122/82, pulse 97, height 5\' 10"  (1.778 m), weight 267 lb (121.1 kg), SpO2 98 %.   General: April [name] apparent distress alert and oriented x3 mood and affect normal, dressed appropriately.  HEENT: Pupils equal, extraocular movements intact  Respiratory: Patient's speak in full sentences and does not appear short of breath  Cardiovascular: April [name] lower extremity edema, non tender, April [name] erythema  Neuro: Cranial nerves II through XII are intact, neurovascularly intact in all extremities with 2+ DTRs and 2+ pulses.  Gait normal with good balance and coordination.  MSK:  Non tender with full range of motion and good stability and symmetric strength and tone of shoulders, elbows, wrist, hip, knee and ankles bilaterally.  Back - Normal skin, Spine with normal alignment and April [name] deformity.  April [name] tenderness to vertebral process palpation.  Paraspinous muscles are not tender and without spasm.   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  Discussed Celebrex.  Encourage muscle relaxer regularly at night.  Discussed posture ergonomics, social determinants of health including patient's comorbidities including rheumatoid arthritis.  Patient will continue to follow-up with rheumatology.  Encouraged core strengthening.  Follow-up again 6 to 8 weeks   Nonallopathic problems  Decision today to treat with OMT was based on Physical  Exam  After verbal consent patient was treated with HVLA, ME, FPR techniques in cervical, rib, thoracic, lumbar, and sacral  areas  Patient tolerated the procedure well with improvement in symptoms  Patient given exercises, stretches and lifestyle modifications  See medications in patient instructions if given  Patient will follow up in 4-8 weeks      The above documentation has been reviewed and is accurate and complete Lyndal Pulley, DO       Note: This dictation was prepared with Dragon dictation along with smaller phrase technology. Any transcriptional errors that result from this process are unintentional.

## 2020-08-16 ENCOUNTER — Ambulatory Visit: Payer: 59 | Admitting: Family Medicine

## 2020-08-16 ENCOUNTER — Encounter: Payer: Self-pay | Admitting: Family Medicine

## 2020-08-16 ENCOUNTER — Other Ambulatory Visit: Payer: Self-pay

## 2020-08-16 VITALS — BP 122/82 | HR 97 | Ht 70.0 in | Wt 267.0 lb

## 2020-08-16 DIAGNOSIS — M999 Biomechanical lesion, unspecified: Secondary | ICD-10-CM | POA: Diagnosis not present

## 2020-08-16 DIAGNOSIS — M533 Sacrococcygeal disorders, not elsewhere classified: Secondary | ICD-10-CM | POA: Diagnosis not present

## 2020-08-16 NOTE — Patient Instructions (Signed)
Try muscle relaxer regularly at night See me in 5-6 weeks

## 2020-08-16 NOTE — Assessment & Plan Note (Addendum)
Chronic problem with exacerbation.  Discussed Celebrex.  Encourage muscle relaxer regularly at night.  Discussed posture ergonomics, social determinants of health including patient's comorbidities including rheumatoid arthritis.  Patient will continue to follow-up with rheumatology.  Encouraged core strengthening.  Follow-up again 6 to 8 weeks

## 2020-08-17 ENCOUNTER — Other Ambulatory Visit: Payer: Self-pay | Admitting: Internal Medicine

## 2020-08-17 MED FILL — ESOMEPRAZOLE MAG DR 40 MG C: 40 | 90 days supply | Qty: 90 | Fill #1

## 2020-08-17 NOTE — Telephone Encounter (Signed)
Please refill as per office routine med refill policy (all routine meds refilled for 3 mo or monthly per pt preference up to one year from last visit, then month to month grace period for 3 mo, then further med refills will have to be denied)  

## 2020-08-18 ENCOUNTER — Other Ambulatory Visit: Payer: Self-pay | Admitting: Internal Medicine

## 2020-08-18 MED FILL — MONTELUKAST SOD 10 MG TAB: 10 | 90 days supply | Qty: 90 | Fill #0

## 2020-08-24 MED FILL — ETONOGESTREL-ETHINYL ESTRAD: 0.12-0.015 | 84 days supply | Qty: 3 | Fill #3

## 2020-09-07 NOTE — Progress Notes (Signed)
   Follow-Up Visit   Subjective  April [name] is a 44 y.o. female who presents for the following: Skin Problem (? H.S- groin, under breast and armpits- tx- bleach baths).  Hidradentitits Location: groin, under breasts, both axillas Duration:  Quality:  Associated Signs/Symptoms: Modifying Factors:  Severity:  Timing: Context:   Objective  Well appearing patient in no apparent distress; mood and affect are within normal limits.  A focused examination was performed including both axillas, groin and under breast. . Relevant physical exam findings are noted in the Assessment and Plan.   Assessment & Plan    Hidradenitis suppurativa (5) Left Inframammary Fold; Right Inframammary Fold; Right Axilla; Left Inguinal Area; Right Inguinal Area  Patient doing better. Patient will call if she needs anything.  Because pharmacist Zaila Peaden was my last patient of the day, we spent over 45 minutes discussing the differential of hidradenitis and abscesses, along with an extended discussion of essentially all treatment options including topical therapies, oral antibiotics (including the concept of antibiotic stewardship), and Humira.  Although it is impossible to be certain how much her rheumatoid arthritis therapy with methotrexate plus golimumab are controlling her hidradenitis, it is certainly appropriate to hold on systemic antibiotics or alternative Biologics.    I, Lavonna Monarch, MD, have reviewed all documentation for this visit.  The documentation on 09/07/20 for the exam, diagnosis, procedures, and orders are all accurate and complete.

## 2020-09-09 ENCOUNTER — Encounter: Payer: Self-pay | Admitting: Dermatology

## 2020-09-13 MED FILL — LOSARTAN POTASSIUM 50 MG TA: 50 | 90 days supply | Qty: 90 | Fill #3

## 2020-09-13 MED FILL — FENOFIBRATE 160 MG TABLET: 160 | 90 days supply | Qty: 90 | Fill #1

## 2020-09-14 ENCOUNTER — Other Ambulatory Visit (HOSPITAL_COMMUNITY): Payer: Self-pay | Admitting: Specialist

## 2020-09-14 DIAGNOSIS — G43719 Chronic migraine without aura, intractable, without status migrainosus: Secondary | ICD-10-CM | POA: Diagnosis not present

## 2020-09-14 DIAGNOSIS — R519 Headache, unspecified: Secondary | ICD-10-CM | POA: Diagnosis not present

## 2020-09-18 MED FILL — GABAPENTIN 300 MG CAPSULE: 300 | 90 days supply | Qty: 90 | Fill #0

## 2020-09-28 DIAGNOSIS — M0589 Other rheumatoid arthritis with rheumatoid factor of multiple sites: Secondary | ICD-10-CM | POA: Diagnosis not present

## 2020-10-02 ENCOUNTER — Ambulatory Visit: Payer: 59 | Admitting: Family Medicine

## 2020-10-18 ENCOUNTER — Encounter: Payer: Self-pay | Admitting: Family Medicine

## 2020-10-18 ENCOUNTER — Ambulatory Visit: Payer: 59 | Admitting: Family Medicine

## 2020-10-18 ENCOUNTER — Other Ambulatory Visit: Payer: Self-pay

## 2020-10-18 VITALS — BP 122/80 | HR 85 | Ht 70.0 in | Wt 270.0 lb

## 2020-10-18 DIAGNOSIS — M999 Biomechanical lesion, unspecified: Secondary | ICD-10-CM | POA: Diagnosis not present

## 2020-10-18 DIAGNOSIS — M533 Sacrococcygeal disorders, not elsewhere classified: Secondary | ICD-10-CM | POA: Diagnosis not present

## 2020-10-18 MED ORDER — METHYLPREDNISOLONE ACETATE 40 MG/ML IJ SUSP
40.0000 mg | Freq: Once | INTRAMUSCULAR | Status: AC
  Administered 2020-10-18: 40 mg via INTRAMUSCULAR

## 2020-10-18 MED ORDER — KETOROLAC TROMETHAMINE 30 MG/ML IJ SOLN
30.0000 mg | Freq: Once | INTRAMUSCULAR | Status: AC
  Administered 2020-10-18: 30 mg via INTRAMUSCULAR

## 2020-10-18 NOTE — Patient Instructions (Addendum)
Good to see you  Injections given today Hold the duexis for tomorrow See me again in 5-6 weeks

## 2020-10-18 NOTE — Progress Notes (Signed)
[name] [place] Phone: [phone] Subjective:   April [name], am serving as a scribe for Dr. Hulan Saas. This visit occurred during the SARS-CoV-2 public health emergency.  Safety protocols were in place, including screening questions prior to the visit, additional usage of staff PPE, and extensive cleaning of exam room while observing appropriate contact time as indicated for disinfecting solutions.   I'm seeing this patient by the request  of:  April Borg, MD  CC: Back and neck pain follow-up  [id number]  April [name] is a 44 y.o. female coming in with complaint of back and neck pain. OMT 08/16/2020. Patient states SI joints are if not the same slightly worse.  Patient states that she has never without some type of discomfort.  Patient's rheumatologist is wondering if we need to change some of the medications and could this be more of a aggravation of the underlying autoimmune disease..  Medications patient has been prescribed: Celebrex, gabapentin  Taking: Yes         Reviewed prior external information including notes and imaging from previsou exam, outside providers and external EMR if available.   As well as notes that were available from care everywhere and other healthcare systems.  Past medical history, social, surgical and family history all reviewed in electronic medical record.  No pertanent information unless stated regarding to the chief complaint.   Past Medical History:  Diagnosis Date  . ALLERGIC RHINITIS   . Anal fissure   . Anxiety   . ANXIETY DEPRESSION   . CONSTIPATION   . DEPRESSION   . Esophageal reflux   . GERD (gastroesophageal reflux disease)   . HYPERCHOLESTEROLEMIA   . OBESITY   . Occipital neuralgia   . Palpitations   . Rheumatoid arthritis ([place]) 12/24/2017    Allergies  Allergen Reactions  . Ciprofloxacin     toxicity  . Levaquin [Levofloxacin] Anxiety     Possible CNS side effects. TOXICITY  . Moxifloxacin     TOXICITY     Review of Systems:  No headache, visual changes, nausea, vomiting, diarrhea, constipation, dizziness, abdominal pain, skin rash, fevers, chills, night sweats, weight loss, swollen lymph nodes, , joint swelling, chest pain, shortness of breath, mood changes. POSITIVE muscle aches, body aches  Objective  Blood pressure 122/80, pulse 85, height 5\' 10"  (1.778 m), weight 270 lb (122.5 kg), SpO2 98 %.   General: No apparent distress alert and oriented x3 mood and affect normal, dressed appropriately.  HEENT: Pupils equal, extraocular movements intact  Respiratory: Patient's speak in full sentences and does not appear short of breath  Cardiovascular: No lower extremity edema, non tender, no erythema  Gait normal with good balance and coordination.  MSK:  Non tender with full range of motion and good stability and symmetric strength and tone of shoulders, elbows, wrist, hip, knee and ankles bilaterally.  Back -back exam shows the patient is mostly tight around the sacroiliac joint bilaterally.  Mild tightness noted with FABER test.  Negative straight leg test.  Patient though does have limited range of motion in all planes of the back.  Neck exam does have some mild loss of lordosis as well.  Negative Spurling's today 5-5 strength of all the extremities  Osteopathic findings  C2 flexed rotated and side bent right C6 flexed rotated and side bent left T3 extended rotated and side bent right inhaled rib T9 extended rotated and side bent left  L2 flexed rotated and side bent right Sacrum right on right       Assessment and Plan:  SI (sacroiliac) joint dysfunction Continues to have more of the pain over the sacroiliac joint.  We discussed that some of it seems to be lumbar related.  Patient has had x-rays of the sacrum that were fairly unremarkable.  We discussed the potential for MRI of the back which patient declined.   Patient will continue with the same regimen otherwise.  Patient given Toradol and Depo-Medrol today.  We will hold the other anti-inflammatories.  Follow-up again in 4 to 8 weeks    Nonallopathic problems  Decision today to treat with OMT was based on Physical Exam  After verbal consent patient was treated with HVLA, ME, FPR techniques in cervical, rib, thoracic, lumbar, and sacral  areas  Patient tolerated the procedure well with improvement in symptoms  Patient given exercises, stretches and lifestyle modifications  See medications in patient instructions if given  Patient will follow up in 4-8 weeks      The above documentation has been reviewed and is accurate and complete Lyndal Pulley, DO       Note: This dictation was prepared with Dragon dictation along with smaller phrase technology. Any transcriptional errors that result from this process are unintentional.

## 2020-10-18 NOTE — Assessment & Plan Note (Addendum)
Continues to have more of the pain over the sacroiliac joint.  We discussed that some of it seems to be lumbar related.  Patient has had x-rays of the sacrum that were fairly unremarkable.  We discussed the potential for MRI of the back which patient declined.  Patient will continue with the same regimen otherwise.  Patient given Toradol and Depo-Medrol today.  We will hold the other anti-inflammatories.  Follow-up again in 4 to 8 weeks

## 2020-10-27 ENCOUNTER — Encounter: Payer: Self-pay | Admitting: Obstetrics & Gynecology

## 2020-10-27 ENCOUNTER — Other Ambulatory Visit: Payer: Self-pay | Admitting: Obstetrics & Gynecology

## 2020-10-27 ENCOUNTER — Other Ambulatory Visit: Payer: Self-pay

## 2020-10-27 ENCOUNTER — Ambulatory Visit (INDEPENDENT_AMBULATORY_CARE_PROVIDER_SITE_OTHER): Payer: 59 | Admitting: Obstetrics & Gynecology

## 2020-10-27 VITALS — BP 130/80 | Ht 70.5 in | Wt 267.0 lb

## 2020-10-27 DIAGNOSIS — Z6837 Body mass index (BMI) 37.0-37.9, adult: Secondary | ICD-10-CM | POA: Diagnosis not present

## 2020-10-27 DIAGNOSIS — Z01419 Encounter for gynecological examination (general) (routine) without abnormal findings: Secondary | ICD-10-CM | POA: Diagnosis not present

## 2020-10-27 DIAGNOSIS — E6609 Other obesity due to excess calories: Secondary | ICD-10-CM

## 2020-10-27 DIAGNOSIS — L732 Hidradenitis suppurativa: Secondary | ICD-10-CM | POA: Diagnosis not present

## 2020-10-27 DIAGNOSIS — Z3044 Encounter for surveillance of vaginal ring hormonal contraceptive device: Secondary | ICD-10-CM | POA: Diagnosis not present

## 2020-10-27 LAB — HM PAP SMEAR

## 2020-10-27 MED ORDER — ETONOGESTREL-ETHINYL ESTRADIOL 0.12-0.015 MG/24HR VA RING: VAGINAL_RING | VAGINAL | 4 refills | Status: DC

## 2020-10-27 NOTE — Progress Notes (Signed)
April [name] 04-30-1976 [id number]   History:    44 y.o. G0 Married. Moved into a new house.  [id number] presenting for annual gyn exam   WOE:HOZY on Nuvaring continuous use.  No BTB. No pelvic pain. Frequent sebaceous gland cysts/abcesses at vulva/upper inner legs and breast/axilla (Hidradenitis suppurativa) and Psoriasis followed by Dermato.  No pain with IC. Breasts wnl. BMI increased to 37.77. Not exercising regularly. Health labs with Fam MD. Anxiety/Depression stable on Lexapro.  Past medical history,surgical history, family history and social history were all reviewed and documented in the EPIC chart.  Gynecologic History No LMP recorded. (Menstrual status: Oral contraceptives).  Obstetric History OB History  Gravida Para Term Preterm AB Living  0 0 0 0 0 0  SAB IAB Ectopic Multiple Live Births  0 0 0 0 0     ROS: A ROS was performed and pertinent positives and negatives are included in the history.  GENERAL: No fevers or chills. HEENT: No change in vision, no earache, sore throat or sinus congestion. NECK: No pain or stiffness. CARDIOVASCULAR: No chest pain or pressure. No palpitations. PULMONARY: No shortness of breath, cough or wheeze. GASTROINTESTINAL: No abdominal pain, nausea, vomiting or diarrhea, melena or bright red blood per rectum. GENITOURINARY: No urinary frequency, urgency, hesitancy or dysuria. MUSCULOSKELETAL: No joint or muscle pain, no back pain, no recent trauma. DERMATOLOGIC: No rash, no itching, no lesions. ENDOCRINE: No polyuria, polydipsia, no heat or cold intolerance. No recent change in weight. HEMATOLOGICAL: No anemia or easy bruising or bleeding. NEUROLOGIC: No headache, seizures, numbness, tingling or weakness. PSYCHIATRIC: No depression, no loss of interest in normal activity or change in sleep pattern.     Exam:   BP 130/80   Ht 5' 10.5" (1.791 m)   Wt 267 lb (121.1 kg)   BMI 37.77 kg/m   Body mass index is  37.77 kg/m.  General appearance : Well developed well nourished female. No acute distress HEENT: Eyes: no retinal hemorrhage or exudates,  Neck supple, trachea midline, no carotid bruits, no thyroidmegaly Lungs: Clear to auscultation, no rhonchi or wheezes, or rib retractions  Heart: Regular rate and rhythm, no murmurs or gallops Breast:Examined in sitting and supine position were symmetrical in appearance, no palpable masses or tenderness,  no skin retraction, no nipple inversion, no nipple discharge, no skin discoloration, no axillary or supraclavicular lymphadenopathy Abdomen: no palpable masses or tenderness, no rebound or guarding Extremities: no edema or skin discoloration or tenderness  Pelvic: Vulva: Normal             Vagina: No gross lesions or discharge  Cervix: No gross lesions or discharge.  Pap reflex done.  Uterus  AV, normal size, shape and consistency, non-tender and mobile  Adnexa  Without masses or tenderness  Anus: Normal   Assessment/Plan:  44 y.o. female for annual exam   1. Encounter for routine gynecological examination with Papanicolaou smear of cervix Normal gynecologic exam.  Pap reflex done.  Breast exam normal.  Will obtain report of screening mammogram done at [facility] December 2021.  Colonoscopy June 2021.  Health labs with family physician.  2. Encounter for surveillance of vaginal ring hormonal contraceptive device Well on NuvaRing continuously.  No contraindication to continue.  Prescription sent to pharmacy.  3. Hidradenitis suppurativa Followed by dermatology.  Using clindamycin gel as needed.  Mild on exam today.  4. Class 2 obesity due to excess calories without serious comorbidity with body mass index (BMI) of 37.0 to  37.9 in adult Recommend a lower calorie/carb diet.  Aerobic activities 5 times a week and light weightlifting every 2 days.  Other orders - etonogestrel-ethinyl estradiol (NUVARING) 0.12-0.015 MG/24HR vaginal ring; REMOVE AND INSERT  A NEW RING INTO THE VAGINA EVERY 4 WEEKS *USE CONTINUOUSLY*  Princess Bruins MD, 10:45 AM 10/27/2020

## 2020-10-28 ENCOUNTER — Encounter: Payer: Self-pay | Admitting: Obstetrics & Gynecology

## 2020-10-30 ENCOUNTER — Other Ambulatory Visit: Payer: Self-pay | Admitting: Obstetrics & Gynecology

## 2020-10-30 MED FILL — CELECOXIB 200 MG CAP: 200 | 30 days supply | Qty: 60 | Fill #1

## 2020-10-30 MED FILL — ESCITALOPRAM 20 MG TABLET: 20 | 90 days supply | Qty: 90 | Fill #1

## 2020-10-31 LAB — PAP IG W/ RFLX HPV ASCU

## 2020-11-06 ENCOUNTER — Encounter: Payer: Self-pay | Admitting: Family Medicine

## 2020-11-06 MED FILL — ETONOGESTREL-ETHINYL ESTRAD: 0.12-0.015 | 84 days supply | Qty: 3 | Fill #0

## 2020-11-13 DIAGNOSIS — L409 Psoriasis, unspecified: Secondary | ICD-10-CM | POA: Diagnosis not present

## 2020-11-13 DIAGNOSIS — K121 Other forms of stomatitis: Secondary | ICD-10-CM | POA: Diagnosis not present

## 2020-11-13 DIAGNOSIS — Z7189 Other specified counseling: Secondary | ICD-10-CM | POA: Diagnosis not present

## 2020-11-13 DIAGNOSIS — M2550 Pain in unspecified joint: Secondary | ICD-10-CM | POA: Diagnosis not present

## 2020-11-13 DIAGNOSIS — R21 Rash and other nonspecific skin eruption: Secondary | ICD-10-CM | POA: Diagnosis not present

## 2020-11-13 DIAGNOSIS — M0589 Other rheumatoid arthritis with rheumatoid factor of multiple sites: Secondary | ICD-10-CM | POA: Diagnosis not present

## 2020-11-13 DIAGNOSIS — Z8709 Personal history of other diseases of the respiratory system: Secondary | ICD-10-CM | POA: Diagnosis not present

## 2020-11-13 DIAGNOSIS — H15101 Unspecified episcleritis, right eye: Secondary | ICD-10-CM | POA: Diagnosis not present

## 2020-11-13 DIAGNOSIS — Z6839 Body mass index (BMI) 39.0-39.9, adult: Secondary | ICD-10-CM | POA: Diagnosis not present

## 2020-11-14 MED FILL — METHOTREXATE 25 MG/ML VIAL: 50 | 84 days supply | Qty: 12 | Fill #1

## 2020-11-15 ENCOUNTER — Other Ambulatory Visit (HOSPITAL_COMMUNITY): Payer: Self-pay | Admitting: Osteopathic Medicine

## 2020-11-15 DIAGNOSIS — H04123 Dry eye syndrome of bilateral lacrimal glands: Secondary | ICD-10-CM | POA: Diagnosis not present

## 2020-11-15 DIAGNOSIS — H15011 Anterior scleritis, right eye: Secondary | ICD-10-CM | POA: Diagnosis not present

## 2020-11-15 DIAGNOSIS — M0570 Rheumatoid arthritis with rheumatoid factor of unspecified site without organ or systems involvement: Secondary | ICD-10-CM | POA: Diagnosis not present

## 2020-11-15 MED FILL — PREDNISOLONE AC 1% EYE DROP: 1 | 75 days supply | Qty: 15 | Fill #0

## 2020-11-16 ENCOUNTER — Other Ambulatory Visit: Payer: Self-pay | Admitting: Internal Medicine

## 2020-11-16 ENCOUNTER — Encounter: Payer: Self-pay | Admitting: Pharmacist

## 2020-11-16 ENCOUNTER — Telehealth (HOSPITAL_BASED_OUTPATIENT_CLINIC_OR_DEPARTMENT_OTHER): Payer: 59 | Admitting: Pharmacist

## 2020-11-16 MED ORDER — CIMZIA STARTER KIT 6 X 200 MG/ML ~~LOC~~ KIT: PACK | SUBCUTANEOUS | 0 refills | Status: DC

## 2020-11-16 NOTE — Progress Notes (Signed)
   S: Patient presents today telephonically for review of their specialty medication.   Patient will start taking Cimzia (certolizumab) for rheumatoid arthritis. Patient is managed by Elpidio Anis for this.   Dosing: Note: Each 400 mg dose should be administered as 2 injections of 200 mg each. Rheumatoid arthritis: SubQ: Initial: 400 mg, repeat dose 2 and 4 weeks after initial dose; Maintenance: 200 mg every other week. May consider maintenance dose of 400 mg every 4 weeks. May be administered alone or in combination with methotrexate.  Patient reports she plans to watch videos online on how to administer the medication.    O:     Lab Results  Component Value Date   WBC 5.0 05/26/2020   HGB 12.8 05/26/2020   HCT 39.6 05/26/2020   MCV 90.4 05/26/2020   PLT 377 05/26/2020      Chemistry      Component Value Date/Time   NA 139 05/26/2020 0841   NA 141 02/21/2014 0000   K 4.5 05/26/2020 0841   CL 103 05/26/2020 0841   CO2 24 05/26/2020 0841   BUN 14 05/26/2020 0841   BUN 13 02/21/2014 0000   CREATININE 0.87 05/26/2020 0841   GLU 93 02/21/2014 0000      Component Value Date/Time   CALCIUM 10.1 05/26/2020 0841   ALKPHOS 92 12/15/2018 1402   AST 13 05/26/2020 0841   ALT 14 05/26/2020 0841   BILITOT 0.3 05/26/2020 0841       A/P: 1. Medication review: patient currently prescribed Cimzia for rheumatoid arthritis and plans to start it on Tuesday. Reviewed the medication with the patient, including the following: Cimzia is a TNF blocking agent used in the treatment of rheumatoid arthritis. She will review videos on how to administer the medication. Possible adverse effects include GI upset, antibody development, increased risk of infection, hypersensitivity, demyelinating CNS disease, hematologic effects, and increased risk of malignancy. Use with caution in patients with heart failure. Avoid use of live vaccinations and let doctor know of any infections as treatment with Cimzia  may need to be held during illness. No recommendations for any changes.  Alvino Blood, PharmD, BCPS, BCACP, CPP Clinical Pharmacist Practitioner

## 2020-11-21 DIAGNOSIS — M0589 Other rheumatoid arthritis with rheumatoid factor of multiple sites: Secondary | ICD-10-CM | POA: Diagnosis not present

## 2020-11-26 NOTE — Progress Notes (Unsigned)
[place] Phone: [phone] Subjective:   [name], am serving as a scribe for Dr. Hulan Saas. This visit occurred during the SARS-CoV-2 public health emergency.  Safety protocols were in place, including screening questions prior to the visit, additional usage of staff PPE, and extensive cleaning of exam room while observing appropriate contact time as indicated for disinfecting solutions.   I'm seeing this patient by the request  of:  April Lima, MD  CC: Neck and back pain follow-up  [id number]  April [name] is a 45 y.o. female coming in with complaint of back and neck pain. OMT 10/18/2020. Patient states that she has had slight improvement.  Patient states that she is on a new biologic medication.  Feels like it could be making a difference.  Continues to get the methotrexate as well.  Continues to take the Celebrex twice a day and when in severe pain takes an extra ibuprofen.  We have prescribed her gabapentin previously as well          Reviewed prior external information including notes and imaging from previsou exam, outside providers and external EMR if available.   As well as notes that were available from care everywhere and other healthcare systems.  Past medical history, social, surgical and family history all reviewed in electronic medical record.  No pertanent information unless stated regarding to the chief complaint.   Past Medical History:  Diagnosis Date  . ALLERGIC RHINITIS   . Anal fissure   . Anxiety   . ANXIETY DEPRESSION   . CONSTIPATION   . DEPRESSION   . Esophageal reflux   . GERD (gastroesophageal reflux disease)   . HYPERCHOLESTEROLEMIA   . OBESITY   . Occipital neuralgia   . Palpitations   . Rheumatoid arthritis ([place]) 12/24/2017    Allergies  Allergen Reactions  . Ciprofloxacin     toxicity  . Levaquin [Levofloxacin] Anxiety    Possible CNS side  effects. TOXICITY  . Moxifloxacin     TOXICITY     Review of Systems:  No headache, visual changes, nausea, vomiting, diarrhea, constipation, dizziness, abdominal pain, skin rash, fevers, chills, night sweats, weight loss, swollen lymph nodes, body aches, joint swelling, chest pain, shortness of breath, mood changes. POSITIVE muscle aches  Objective  Blood pressure 124/82, pulse 85, height 5' 10.5" (1.791 m), weight 265 lb (120.2 kg), SpO2 98 %.   General: No apparent distress alert and oriented x3 mood and affect normal, dressed appropriately.  HEENT: Pupils equal, extraocular movements intact  Respiratory: Patient's speak in full sentences and does not appear short of breath  Cardiovascular: No lower extremity edema, non tender, no erythema  Neuro: Cranial nerves II through XII are intact, neurovascularly intact in all extremities with 2+ DTRs and 2+ pulses.  Gait normal with good balance and coordination.  MSK:  Non tender with full range of motion and good stability and symmetric strength and tone of shoulders, elbows, wrist, hip, knee and ankles bilaterally.  Back -back exam patient still has some mild weakness of the core.  Patient does have loss of lordosis.  Tightness with FABER test bilaterally.  Tightness with straight leg test but no true radicular symptoms.  Some mild increase in discomfort though with extension greater than 10 degrees.  Neurovascularly intact distally with 5 out of 5 strength Neck exam mild stiffness noted.  Patient does have some mild loss of lordosis.  Osteopathic  findings  C6 flexed rotated and side bent left T4 extended rotated and side bent left inhaled rib L2 flexed rotated and side bent right Sacrum right on right       Assessment and Plan:  SI (sacroiliac) joint dysfunction Mild worsening the sacroiliac joint dysfunction.  Patient does have a history of rheumatoid arthritis.  Could be potentially some more of a nonradiologic ankylosing  spondylitis and hopefully the new biologic patient is on may make some improvement.  We discussed which activities to doing which wants to avoid.  Patient will continue to work on core strength.  Continues to respond fairly well to osteopathic manipulation.  Follow-up again in 6 to 8 weeks    Nonallopathic problems  Decision today to treat with OMT was based on Physical Exam  After verbal consent patient was treated with  ME, FPR techniques in cervical, rib, thoracic, lumbar, and sacral  areas avoided HVLA on the cervical spine today.  Did have some stiffness  Patient tolerated the procedure well with improvement in symptoms  Patient given exercises, stretches and lifestyle modifications  See medications in patient instructions if given  Patient will follow up in 4-8 weeks      The above documentation has been reviewed and is accurate and complete April Pulley, DO       Note: This dictation was prepared with Dragon dictation along with smaller phrase technology. Any transcriptional errors that result from this process are unintentional.

## 2020-11-27 ENCOUNTER — Other Ambulatory Visit: Payer: Self-pay

## 2020-11-27 ENCOUNTER — Encounter: Payer: Self-pay | Admitting: Family Medicine

## 2020-11-27 ENCOUNTER — Ambulatory Visit: Payer: 59 | Admitting: Family Medicine

## 2020-11-27 VITALS — BP 124/82 | HR 85 | Ht 70.5 in | Wt 265.0 lb

## 2020-11-27 DIAGNOSIS — M940 Chondrocostal junction syndrome [Tietze]: Secondary | ICD-10-CM

## 2020-11-27 DIAGNOSIS — M999 Biomechanical lesion, unspecified: Secondary | ICD-10-CM

## 2020-11-27 DIAGNOSIS — Z1231 Encounter for screening mammogram for malignant neoplasm of breast: Secondary | ICD-10-CM | POA: Diagnosis not present

## 2020-11-27 DIAGNOSIS — M533 Sacrococcygeal disorders, not elsewhere classified: Secondary | ICD-10-CM

## 2020-11-27 NOTE — Assessment & Plan Note (Signed)
Mild worsening the sacroiliac joint dysfunction.  Patient does have a history of rheumatoid arthritis.  Could be potentially some more of a nonradiologic ankylosing spondylitis and hopefully the new biologic patient is on may make some improvement.  We discussed which activities to doing which wants to avoid.  Patient will continue to work on core strength.  Continues to respond fairly well to osteopathic manipulation.  Follow-up again in 6 to 8 weeks

## 2020-11-27 NOTE — Patient Instructions (Signed)
Good to see you Doing great New medicine is helping Shoulders down and back See me in 6-8 weeks

## 2020-11-29 ENCOUNTER — Ambulatory Visit: Payer: 59 | Admitting: Family Medicine

## 2020-12-04 MED FILL — CIMZIA 200 MG/ML STARTER KI: 6 X 200 | 30 days supply | Qty: 3 | Fill #0

## 2020-12-06 ENCOUNTER — Encounter: Payer: Self-pay | Admitting: Obstetrics & Gynecology

## 2020-12-06 DIAGNOSIS — R922 Inconclusive mammogram: Secondary | ICD-10-CM | POA: Diagnosis not present

## 2020-12-11 ENCOUNTER — Encounter: Payer: Self-pay | Admitting: Anesthesiology

## 2020-12-11 MED FILL — FENOFIBRATE 160 MG TABLET: 160 | 90 days supply | Qty: 90 | Fill #2

## 2020-12-11 MED FILL — ESOMEPRAZOLE MAG DR 40 MG C: 40 | 90 days supply | Qty: 90 | Fill #2

## 2020-12-11 MED FILL — MONTELUKAST SOD 10 MG TAB: 10 | 90 days supply | Qty: 90 | Fill #1

## 2020-12-11 MED FILL — FOLIC ACID 1 MG TABS: 1 | 90 days supply | Qty: 90 | Fill #1

## 2020-12-12 ENCOUNTER — Encounter: Payer: Self-pay | Admitting: Internal Medicine

## 2020-12-12 ENCOUNTER — Other Ambulatory Visit: Payer: Self-pay | Admitting: Internal Medicine

## 2020-12-12 DIAGNOSIS — I10 Essential (primary) hypertension: Secondary | ICD-10-CM

## 2020-12-12 MED ORDER — LOSARTAN POTASSIUM 50 MG PO TABS: 50.0000 mg | ORAL_TABLET | Freq: Every day | ORAL | 1 refills | Status: DC

## 2020-12-12 MED FILL — LOSARTAN POTASSIUM 50 MG TA: 50 | 30 days supply | Qty: 30 | Fill #0

## 2020-12-14 ENCOUNTER — Other Ambulatory Visit: Payer: Self-pay | Admitting: Internal Medicine

## 2020-12-14 DIAGNOSIS — L732 Hidradenitis suppurativa: Secondary | ICD-10-CM | POA: Diagnosis not present

## 2020-12-14 DIAGNOSIS — L400 Psoriasis vulgaris: Secondary | ICD-10-CM | POA: Diagnosis not present

## 2020-12-14 DIAGNOSIS — L738 Other specified follicular disorders: Secondary | ICD-10-CM | POA: Diagnosis not present

## 2020-12-14 MED FILL — CLINDAMYCIN PH 1% GEL: 1 | 30 days supply | Qty: 60 | Fill #0

## 2020-12-14 MED FILL — CEPHALEXIN 500 MG CAPSULE: 500 | 30 days supply | Qty: 60 | Fill #0

## 2020-12-25 MED FILL — CELECOXIB 200 MG CAP: 200 | 30 days supply | Qty: 60 | Fill #2

## 2020-12-25 MED FILL — GABAPENTIN 300 MG CAPSULE: 300 | 90 days supply | Qty: 90 | Fill #1

## 2021-01-01 DIAGNOSIS — H2513 Age-related nuclear cataract, bilateral: Secondary | ICD-10-CM | POA: Diagnosis not present

## 2021-01-01 DIAGNOSIS — H15001 Unspecified scleritis, right eye: Secondary | ICD-10-CM | POA: Diagnosis not present

## 2021-01-01 DIAGNOSIS — H3581 Retinal edema: Secondary | ICD-10-CM | POA: Diagnosis not present

## 2021-01-12 MED FILL — LOSARTAN POTASSIUM 50 MG TA: 50 | 30 days supply | Qty: 30 | Fill #1

## 2021-01-15 NOTE — Progress Notes (Signed)
[place] Phone: [phone] Subjective:    I'm seeing this patient by the request  of:  Janith Lima, MD  CC: Back and neck pain follow-up  [id number]  April [name] is a 45 y.o. female coming in with complaint of back and neck pain Patient states overall has had some ups and downs.  Describes the pain as a dull, throbbing aching sensation.  No radiation down the leg.  Seems to be more at the sacroiliac joint still.  Sometimes he did have a catching sensation.  Medications patient has been prescribed: Celebrex  Taking:         Reviewed prior external information including notes and imaging from previsou exam, outside providers and external EMR if available.   As well as notes that were available from care everywhere and other healthcare systems.  Past medical history, social, surgical and family history all reviewed in electronic medical record.  No pertanent information unless stated regarding to the chief complaint.   Past Medical History:  Diagnosis Date  . ALLERGIC RHINITIS   . Anal fissure   . Anxiety   . ANXIETY DEPRESSION   . CONSTIPATION   . DEPRESSION   . Esophageal reflux   . GERD (gastroesophageal reflux disease)   . HYPERCHOLESTEROLEMIA   . OBESITY   . Occipital neuralgia   . Palpitations   . Rheumatoid arthritis ([place]) 12/24/2017    Allergies  Allergen Reactions  . Ciprofloxacin     toxicity  . Levaquin [Levofloxacin] Anxiety    Possible CNS side effects. TOXICITY  . Moxifloxacin     TOXICITY     Review of Systems:  No headache, visual changes, nausea, vomiting, diarrhea, constipation, dizziness, abdominal pain, skin rash, fevers, chills, night sweats, weight loss, swollen lymph nodes, body aches, joint swelling, chest pain, shortness of breath, mood changes. POSITIVE muscle aches  Objective  Blood pressure 122/84, pulse 86, height 5\' 10"  (1.778 m), weight 268 lb  (121.6 kg), SpO2 98 %.   General: No apparent distress alert and oriented x3 mood and affect normal, dressed appropriately.  HEENT: Pupils equal, extraocular movements intact  Respiratory: Patient's speak in full sentences and does not appear short of breath  Cardiovascular: No lower extremity edema, non tender, no erythema  Neuro: Cranial nerves II through XII are intact, neurovascularly intact in all extremities with 2+ DTRs and 2+ pulses.  Gait normal with good balance and coordination.  MSK:  Non tender with full range of motion and good stability and symmetric strength and tone of shoulders, elbows, wrist, hip, knee and ankles bilaterally.  Back -low back exam does have some loss of lordosis.  Some tenderness to palpation in the paraspinal musculature.  Seems to be tighter on the right than the left to the sacroiliac joint.  Tightness with FABER test bilaterally, tightness of the hamstrings with no radicular symptoms.  Osteopathic findings  C6 flexed rotated and side bent left T3 extended rotated and side bent right inhaled rib T9 extended rotated and side bent left L2 flexed rotated and side bent right Sacrum right on right       Assessment and Plan:  SI (sacroiliac) joint dysfunction Continues to have some difficulty noted.  Discussed with patient amount of core strengthening.  Is responding fairly well to the Celebrex though.  Taking only really 1 time a day ago.  We discussed the potential for sacroiliac injections if necessary.  Patient wants  to give more time for some of the biologic medications to work.  Follow-up with me again in 5 to 6 weeks    Nonallopathic problems  Decision today to treat with OMT was based on Physical Exam  After verbal consent patient was treated with HVLA, ME, FPR techniques in cervical, rib, thoracic, lumbar, and sacral  areas avoided HVLA in the cervical region.  Patient tolerated the procedure well with improvement in symptoms  Patient  given exercises, stretches and lifestyle modifications  See medications in patient instructions if given  Patient will follow up in 5-6 weeks      The above documentation has been reviewed and is accurate and complete Lyndal Pulley, DO       Note: This dictation was prepared with Dragon dictation along with smaller phrase technology. Any transcriptional errors that result from this process are unintentional.

## 2021-01-16 ENCOUNTER — Encounter: Payer: Self-pay | Admitting: Family Medicine

## 2021-01-16 ENCOUNTER — Other Ambulatory Visit: Payer: Self-pay

## 2021-01-16 ENCOUNTER — Ambulatory Visit: Payer: 59 | Admitting: Family Medicine

## 2021-01-16 VITALS — BP 122/84 | HR 86 | Ht 70.0 in | Wt 268.0 lb

## 2021-01-16 DIAGNOSIS — M999 Biomechanical lesion, unspecified: Secondary | ICD-10-CM | POA: Diagnosis not present

## 2021-01-16 DIAGNOSIS — M533 Sacrococcygeal disorders, not elsewhere classified: Secondary | ICD-10-CM

## 2021-01-16 NOTE — Assessment & Plan Note (Signed)
Continues to have some difficulty noted.  Discussed with patient amount of core strengthening.  Is responding fairly well to the Celebrex though.  Taking only really 1 time a day ago.  We discussed the potential for sacroiliac injections if necessary.  Patient wants to give more time for some of the biologic medications to work.  Follow-up with me again in 5 to 6 weeks

## 2021-01-16 NOTE — Patient Instructions (Signed)
Good to see you Keep on keeping on No change right now  Can consider SI jt injections See me in 5-6 weeks

## 2021-01-18 ENCOUNTER — Other Ambulatory Visit (HOSPITAL_COMMUNITY): Payer: Self-pay | Admitting: Dermatology

## 2021-01-18 DIAGNOSIS — L304 Erythema intertrigo: Secondary | ICD-10-CM | POA: Diagnosis not present

## 2021-01-18 DIAGNOSIS — L308 Other specified dermatitis: Secondary | ICD-10-CM | POA: Diagnosis not present

## 2021-01-18 DIAGNOSIS — L738 Other specified follicular disorders: Secondary | ICD-10-CM | POA: Diagnosis not present

## 2021-01-18 DIAGNOSIS — L732 Hidradenitis suppurativa: Secondary | ICD-10-CM | POA: Diagnosis not present

## 2021-01-18 MED FILL — NYSTATIN-TRIAMCINOLONE OINT: 100000-0.1 | 15 days supply | Qty: 30 | Fill #0

## 2021-01-19 ENCOUNTER — Other Ambulatory Visit: Payer: Self-pay | Admitting: Pharmacist

## 2021-01-19 MED ORDER — CIMZIA STARTER KIT 6 X 200 MG/ML ~~LOC~~ KIT: PACK | SUBCUTANEOUS | 0 refills | Status: DC

## 2021-01-23 ENCOUNTER — Other Ambulatory Visit: Payer: Self-pay | Admitting: Pharmacist

## 2021-01-23 MED ORDER — CERTOLIZUMAB PEGOL 2 X 200 MG/ML ~~LOC~~ KIT: PACK | SUBCUTANEOUS | 5 refills | Status: DC

## 2021-01-23 MED ORDER — CERTOLIZUMAB PEGOL 2 X 200 MG ~~LOC~~ KIT: PACK | SUBCUTANEOUS | 0 refills | Status: DC

## 2021-01-23 MED FILL — CIMZIA 200 MG/ML SYRN KIT: 2 X 200 | 28 days supply | Qty: 1 | Fill #0

## 2021-01-23 MED FILL — ESCITALOPRAM 20 MG TABLET: 20 | 90 days supply | Qty: 90 | Fill #2

## 2021-02-07 MED FILL — LOSARTAN POTASSIUM 50 MG TA: 50 | 30 days supply | Qty: 30 | Fill #2

## 2021-02-08 ENCOUNTER — Other Ambulatory Visit (HOSPITAL_COMMUNITY): Payer: Self-pay | Admitting: Physician Assistant

## 2021-02-08 MED FILL — METHOTREXATE 25 MG/ML VIAL: 50 | 84 days supply | Qty: 12 | Fill #0

## 2021-02-12 ENCOUNTER — Other Ambulatory Visit (HOSPITAL_COMMUNITY): Payer: Self-pay | Admitting: Physician Assistant

## 2021-02-12 DIAGNOSIS — K121 Other forms of stomatitis: Secondary | ICD-10-CM | POA: Diagnosis not present

## 2021-02-12 DIAGNOSIS — M0589 Other rheumatoid arthritis with rheumatoid factor of multiple sites: Secondary | ICD-10-CM | POA: Diagnosis not present

## 2021-02-12 DIAGNOSIS — M533 Sacrococcygeal disorders, not elsewhere classified: Secondary | ICD-10-CM | POA: Diagnosis not present

## 2021-02-12 DIAGNOSIS — L409 Psoriasis, unspecified: Secondary | ICD-10-CM | POA: Diagnosis not present

## 2021-02-12 DIAGNOSIS — Z8709 Personal history of other diseases of the respiratory system: Secondary | ICD-10-CM | POA: Diagnosis not present

## 2021-02-12 DIAGNOSIS — M2550 Pain in unspecified joint: Secondary | ICD-10-CM | POA: Diagnosis not present

## 2021-02-12 DIAGNOSIS — R21 Rash and other nonspecific skin eruption: Secondary | ICD-10-CM | POA: Diagnosis not present

## 2021-02-12 DIAGNOSIS — H15101 Unspecified episcleritis, right eye: Secondary | ICD-10-CM | POA: Diagnosis not present

## 2021-02-12 DIAGNOSIS — Z7189 Other specified counseling: Secondary | ICD-10-CM | POA: Diagnosis not present

## 2021-02-12 MED FILL — predniSONE 20 MG TABS: 20 | 8 days supply | Qty: 18 | Fill #0

## 2021-02-15 ENCOUNTER — Other Ambulatory Visit (HOSPITAL_COMMUNITY): Payer: Self-pay

## 2021-02-16 MED FILL — CIMZIA 200 MG/ML SYRN KIT: 2 X 200 | 28 days supply | Qty: 1 | Fill #1

## 2021-02-17 ENCOUNTER — Other Ambulatory Visit (HOSPITAL_COMMUNITY): Payer: Self-pay

## 2021-02-20 NOTE — Progress Notes (Signed)
[facility] [address] [place] Phone: [phone] Subjective:   I [name] am serving as a Education administrator for Dr. Hulan Saas.  This visit occurred during the SARS-CoV-2 public health emergency.  Safety protocols were in place, including screening questions prior to the visit, additional usage of staff PPE, and extensive cleaning of exam room while observing appropriate contact time as indicated for disinfecting solutions.   I'm seeing this patient by the request  of:  Janith Lima, MD  CC: back and neck pain   [id number]  [place] JOANIE DUPREY is a 45 y.o. female coming in with complaint of back and neck pain. OMT 01/16/2021. Patient states she is improved on prednisone.  Patient states still has some mild tightness noted in the back.  Patient feels like the prednisone has helped enough that it has allowed her to be more active again.  Medications patient has been prescribed: None          Reviewed prior external information including notes and imaging from previsou exam, outside providers and external EMR if available.   As well as notes that were available from care everywhere and other healthcare systems.  Past medical history, social, surgical and family history all reviewed in electronic medical record.  No pertanent information unless stated regarding to the chief complaint.   Past Medical History:  Diagnosis Date  . ALLERGIC RHINITIS   . Anal fissure   . Anxiety   . ANXIETY DEPRESSION   . CONSTIPATION   . DEPRESSION   . Esophageal reflux   . GERD (gastroesophageal reflux disease)   . HYPERCHOLESTEROLEMIA   . OBESITY   . Occipital neuralgia   . Palpitations   . Rheumatoid arthritis ([place]) 12/24/2017    Allergies  Allergen Reactions  . Ciprofloxacin     toxicity  . Levaquin [Levofloxacin] Anxiety    Possible CNS side effects. TOXICITY  . Moxifloxacin     TOXICITY     Review of Systems:  No headache,  visual changes, nausea, vomiting, diarrhea, constipation, dizziness, abdominal pain, skin rash, fevers, chills, night sweats, weight loss, swollen lymph nodes, body aches, joint swelling, chest pain, shortness of breath, mood changes. POSITIVE muscle aches, body aches  Objective  Blood pressure 130/82, pulse 87, height 5\' 10"  (1.778 m), weight 273 lb (123.8 kg), SpO2 98 %.   General: No apparent distress alert and oriented x3 mood and affect normal, dressed appropriately.  HEENT: Pupils equal, extraocular movements intact  Respiratory: Patient's speak in full sentences and does not appear short of breath  Cardiovascular: No lower extremity edema, non tender, no erythema  Gait normal with good balance and coordination.  MSK:  Non tender with full range of motion and good stability and symmetric strength and tone of shoulders, elbows, wrist, hip, knee and ankles bilaterally.  Back -low back exam does have some loss of lordosis.  Tenderness still noted in the paraspinal musculature.  Patient continues to have tightness of the [facility] bilaterally.  Hamstrings do have tightness as well but no radicular symptoms.  Deep tendon reflexes intact.  Symmetric strength noted.  Osteopathic findings  C6 flexed rotated and side bent left T3 extended rotated and side bent right inhaled rib T7 extended rotated and side bent left L2 flexed rotated and side bent right Sacrum right on right       Assessment and Plan: SI (sacroiliac) joint dysfunction Patient likely did have mild exacerbation.  Discussed with patient about  icing regimen and home exercises, which activities to do which wants to avoid.  Do believe the patient did have a flare of her autoimmune likely contributed to some and patient did respond well to the prednisone.  Patient responded very well to manipulation today as well.  Follow-up with me again 6 weeks     Nonallopathic problems  Decision today to treat with OMT was based on Physical  Exam  After verbal consent patient was treated with HVLA, ME, FPR techniques in cervical, rib, thoracic, lumbar, and sacral  areas  Patient tolerated the procedure well with improvement in symptoms  Patient given exercises, stretches and lifestyle modifications  See medications in patient instructions if given  Patient will follow up in 4-8 weeks      The above documentation has been reviewed and is accurate and complete Lyndal Pulley, DO       Note: This dictation was prepared with Dragon dictation along with smaller phrase technology. Any transcriptional errors that result from this process are unintentional.

## 2021-02-21 ENCOUNTER — Other Ambulatory Visit: Payer: Self-pay

## 2021-02-21 ENCOUNTER — Encounter: Payer: Self-pay | Admitting: Family Medicine

## 2021-02-21 ENCOUNTER — Ambulatory Visit: Payer: 59 | Admitting: Family Medicine

## 2021-02-21 ENCOUNTER — Other Ambulatory Visit (HOSPITAL_COMMUNITY): Payer: Self-pay

## 2021-02-21 VITALS — BP 130/82 | HR 87 | Ht 70.0 in | Wt 273.0 lb

## 2021-02-21 DIAGNOSIS — M533 Sacrococcygeal disorders, not elsewhere classified: Secondary | ICD-10-CM

## 2021-02-21 DIAGNOSIS — M999 Biomechanical lesion, unspecified: Secondary | ICD-10-CM | POA: Diagnosis not present

## 2021-02-21 DIAGNOSIS — G43719 Chronic migraine without aura, intractable, without status migrainosus: Secondary | ICD-10-CM | POA: Diagnosis not present

## 2021-02-21 DIAGNOSIS — R519 Headache, unspecified: Secondary | ICD-10-CM | POA: Diagnosis not present

## 2021-02-21 MED ORDER — GABAPENTIN 300 MG PO CAPS
ORAL_CAPSULE | ORAL | 1 refills | Status: DC
Start: 2021-02-21 — End: 2021-08-20
  Filled 2021-02-21 – 2021-04-06 (×2): qty 90, 90d supply, fill #0
  Filled 2021-07-03: qty 90, 90d supply, fill #1

## 2021-02-21 NOTE — Assessment & Plan Note (Signed)
Patient likely did have mild exacerbation.  Discussed with patient about icing regimen and home exercises, which activities to do which wants to avoid.  Do believe the patient did have a flare of her autoimmune likely contributed to some and patient did respond well to the prednisone.  Patient responded very well to manipulation today as well.  Follow-up with me again 6 weeks

## 2021-02-21 NOTE — Patient Instructions (Signed)
Good to see you Having a flare but doing well See me again in 5-6 weeks

## 2021-03-13 ENCOUNTER — Other Ambulatory Visit: Payer: Self-pay

## 2021-03-13 ENCOUNTER — Other Ambulatory Visit (HOSPITAL_COMMUNITY): Payer: Self-pay

## 2021-03-13 MED FILL — Folic Acid Tab 1 MG: ORAL | 90 days supply | Qty: 90 | Fill #0 | Status: AC

## 2021-03-13 MED FILL — Losartan Potassium Tab 50 MG: ORAL | 90 days supply | Qty: 90 | Fill #0 | Status: AC

## 2021-03-13 MED FILL — Fenofibrate Tab 160 MG: ORAL | 90 days supply | Qty: 90 | Fill #0 | Status: AC

## 2021-03-13 MED FILL — Esomeprazole Magnesium Cap Delayed Release 40 MG (Base Eq): ORAL | 90 days supply | Qty: 90 | Fill #0 | Status: AC

## 2021-03-14 ENCOUNTER — Other Ambulatory Visit (HOSPITAL_COMMUNITY): Payer: Self-pay

## 2021-03-14 MED FILL — Certolizumab Pegol Inj Kit 2 X 200 MG/ML: SUBCUTANEOUS | 28 days supply | Qty: 2 | Fill #0 | Status: CN

## 2021-03-15 ENCOUNTER — Other Ambulatory Visit (HOSPITAL_COMMUNITY): Payer: Self-pay

## 2021-03-16 ENCOUNTER — Other Ambulatory Visit (HOSPITAL_COMMUNITY): Payer: Self-pay

## 2021-03-16 ENCOUNTER — Other Ambulatory Visit: Payer: Self-pay

## 2021-03-19 ENCOUNTER — Other Ambulatory Visit (HOSPITAL_COMMUNITY): Payer: Self-pay

## 2021-03-19 MED ORDER — CELECOXIB 200 MG PO CAPS
200.0000 mg | ORAL_CAPSULE | Freq: Two times a day (BID) | ORAL | 2 refills | Status: DC
  Filled 2021-03-19: qty 60, 30d supply, fill #0
  Filled 2021-04-13: qty 60, 30d supply, fill #1
  Filled 2021-08-20: qty 60, 30d supply, fill #2

## 2021-03-20 ENCOUNTER — Other Ambulatory Visit (HOSPITAL_COMMUNITY): Payer: Self-pay

## 2021-03-20 MED ORDER — CERTOLIZUMAB PEGOL 2 X 200 MG/ML ~~LOC~~ PSKT
PREFILLED_SYRINGE | SUBCUTANEOUS | 4 refills | Status: DC
  Filled 2021-03-20: qty 2, 28d supply, fill #0
  Filled 2021-04-13 – 2021-04-18 (×2): qty 2, 28d supply, fill #1

## 2021-03-21 ENCOUNTER — Other Ambulatory Visit (HOSPITAL_COMMUNITY): Payer: Self-pay

## 2021-03-28 ENCOUNTER — Ambulatory Visit: Payer: 59 | Admitting: Family Medicine

## 2021-04-06 ENCOUNTER — Other Ambulatory Visit (HOSPITAL_COMMUNITY): Payer: Self-pay

## 2021-04-11 NOTE — Progress Notes (Signed)
[place] Phone: [phone] Subjective:   April [name], am serving as a scribe for Dr. Hulan [name]. This visit occurred during the SARS-CoV-2 public health emergency.  Safety protocols were in place, including screening questions prior to the visit, additional usage of staff PPE, and extensive cleaning of exam room while observing appropriate contact time as indicated for disinfecting solutions.   I'm seeing this patient by the request  of:  April Lima, MD  CC:  Back and neck pain follow up   [id number]  April [name] is a 44 y.o. female coming in with complaint of back and neck pain. OMT 02/21/2021. Patient states that she has problems with SI joints but they are [name] worse than last visit.  Patient states that it is a dull, throbbing aching pain.  Patient will be seeing her rheumatologist next month and feels like she will be changing some of her medications with patient not responding as well as she has started in the past with some other ones.   Medications patient has been prescribed: None  Taking:         Reviewed prior external information including notes and imaging from previsou exam, outside providers and external EMR if available.   As well as notes that were available from care everywhere and other healthcare systems.  Past medical history, social, surgical and family history all reviewed in electronic medical record.  [name] pertanent information unless stated regarding to the chief complaint.   Past Medical History:  Diagnosis Date  . ALLERGIC RHINITIS   . Anal fissure   . Anxiety   . ANXIETY DEPRESSION   . CONSTIPATION   . DEPRESSION   . Esophageal reflux   . GERD (gastroesophageal reflux disease)   . HYPERCHOLESTEROLEMIA   . OBESITY   . Occipital neuralgia   . Palpitations   . Rheumatoid arthritis ([place]) 12/24/2017    Allergies  Allergen Reactions  . Ciprofloxacin      toxicity  . Levaquin [Levofloxacin] Anxiety    Possible CNS side effects. TOXICITY  . Moxifloxacin     TOXICITY     Review of Systems:  [name] headache, visual changes, nausea, vomiting, diarrhea, constipation, dizziness, abdominal pain, skin rash, fevers, chills, night sweats, weight loss, swollen lymph nodes, body aches, joint swelling, chest pain, shortness of breath, mood changes. POSITIVE muscle aches  Objective  Blood pressure 124/80, pulse 62, height 5\' 10"  (1.778 m), weight 271 lb (122.9 kg), SpO2 99 %.   General: [name] apparent distress alert and oriented x3 mood and affect normal, dressed appropriately.  HEENT: Pupils equal, extraocular movements intact  Respiratory: Patient's speak in full sentences and does not appear short of breath  Cardiovascular: [name] lower extremity edema, non tender, [name] erythema  [name] back exam does have some mild tightness noted in the paraspinal musculature.  He does have tightness to more of the sacroiliac joints bilaterally.  Tightness with FABER test and worsening pain with mild extension of the back.  [name] significant radicular symptoms.  Osteopathic findings  C6 flexed rotated and side bent left T3 extended rotated and side bent right inhaled rib T5 extended rotated and side bent left L2 flexed rotated and side bent right Sacrum right on right Pelvic shear noted right with a anterior ilium      Assessment and Plan:  SI (sacroiliac) joint dysfunction Chronic, with exacerbation.  Patient does have the Celebrex and can take it  twice a day.  We will be following up with her rheumatologist currently autoimmune medication.      Nonallopathic problems  Decision today to treat with OMT was based on Physical Exam  After verbal consent patient was treated with HVLA, ME, FPR techniques in cervical, rib, thoracic, lumbar, and sacral  areas  Patient tolerated the procedure well with improvement in symptoms  Patient given exercises, stretches and  lifestyle modifications  See medications in patient instructions if given  Patient will follow up in 4-8 weeks      The above documentation has been reviewed and is accurate and complete April Pulley, DO       Note: This dictation was prepared with Dragon dictation along with smaller phrase technology. Any transcriptional errors that result from this process are unintentional.

## 2021-04-12 ENCOUNTER — Ambulatory Visit (INDEPENDENT_AMBULATORY_CARE_PROVIDER_SITE_OTHER): Payer: 59 | Admitting: Family Medicine

## 2021-04-12 ENCOUNTER — Encounter: Payer: Self-pay | Admitting: Family Medicine

## 2021-04-12 ENCOUNTER — Other Ambulatory Visit: Payer: Self-pay

## 2021-04-12 VITALS — BP 124/80 | HR 62 | Ht 70.0 in | Wt 271.0 lb

## 2021-04-12 DIAGNOSIS — M9902 Segmental and somatic dysfunction of thoracic region: Secondary | ICD-10-CM | POA: Diagnosis not present

## 2021-04-12 DIAGNOSIS — M9903 Segmental and somatic dysfunction of lumbar region: Secondary | ICD-10-CM | POA: Diagnosis not present

## 2021-04-12 DIAGNOSIS — H5213 Myopia, bilateral: Secondary | ICD-10-CM | POA: Diagnosis not present

## 2021-04-12 DIAGNOSIS — M533 Sacrococcygeal disorders, not elsewhere classified: Secondary | ICD-10-CM | POA: Diagnosis not present

## 2021-04-12 DIAGNOSIS — M9908 Segmental and somatic dysfunction of rib cage: Secondary | ICD-10-CM | POA: Diagnosis not present

## 2021-04-12 DIAGNOSIS — M9901 Segmental and somatic dysfunction of cervical region: Secondary | ICD-10-CM

## 2021-04-12 DIAGNOSIS — H524 Presbyopia: Secondary | ICD-10-CM | POA: Diagnosis not present

## 2021-04-12 DIAGNOSIS — M9904 Segmental and somatic dysfunction of sacral region: Secondary | ICD-10-CM

## 2021-04-12 DIAGNOSIS — H52223 Regular astigmatism, bilateral: Secondary | ICD-10-CM | POA: Diagnosis not present

## 2021-04-12 NOTE — Patient Instructions (Signed)
Good to see you Keep working on SI joint Talk to rheumatologist See me in 6-7 weeks

## 2021-04-12 NOTE — Assessment & Plan Note (Signed)
Chronic, with exacerbation.  Patient does have the Celebrex and can take it twice a day.  We will be following up with her rheumatologist currently autoimmune medication.

## 2021-04-13 ENCOUNTER — Other Ambulatory Visit (HOSPITAL_COMMUNITY): Payer: Self-pay

## 2021-04-13 MED FILL — Etonogestrel-Ethinyl Estradiol VA Ring 0.12-0.015 MG/24HR: VAGINAL | 84 days supply | Qty: 3 | Fill #0 | Status: AC

## 2021-04-17 ENCOUNTER — Other Ambulatory Visit (HOSPITAL_COMMUNITY): Payer: Self-pay

## 2021-04-18 ENCOUNTER — Other Ambulatory Visit (HOSPITAL_COMMUNITY): Payer: Self-pay

## 2021-04-19 ENCOUNTER — Other Ambulatory Visit (HOSPITAL_COMMUNITY): Payer: Self-pay

## 2021-04-23 ENCOUNTER — Other Ambulatory Visit: Payer: Self-pay | Admitting: Internal Medicine

## 2021-04-23 ENCOUNTER — Other Ambulatory Visit (HOSPITAL_COMMUNITY): Payer: Self-pay

## 2021-04-23 MED ORDER — ESCITALOPRAM OXALATE 20 MG PO TABS
20.0000 mg | ORAL_TABLET | Freq: Every day | ORAL | 0 refills | Status: DC
Start: 2021-04-23 — End: 2021-07-27
  Filled 2021-04-23: qty 90, 90d supply, fill #0

## 2021-05-11 ENCOUNTER — Ambulatory Visit: Payer: 59 | Attending: Family Medicine | Admitting: Pharmacist

## 2021-05-11 ENCOUNTER — Other Ambulatory Visit (HOSPITAL_COMMUNITY): Payer: Self-pay

## 2021-05-11 ENCOUNTER — Other Ambulatory Visit: Payer: Self-pay

## 2021-05-11 DIAGNOSIS — M2550 Pain in unspecified joint: Secondary | ICD-10-CM | POA: Diagnosis not present

## 2021-05-11 DIAGNOSIS — Z7189 Other specified counseling: Secondary | ICD-10-CM | POA: Diagnosis not present

## 2021-05-11 DIAGNOSIS — M533 Sacrococcygeal disorders, not elsewhere classified: Secondary | ICD-10-CM | POA: Diagnosis not present

## 2021-05-11 DIAGNOSIS — L409 Psoriasis, unspecified: Secondary | ICD-10-CM | POA: Diagnosis not present

## 2021-05-11 DIAGNOSIS — H15101 Unspecified episcleritis, right eye: Secondary | ICD-10-CM | POA: Diagnosis not present

## 2021-05-11 DIAGNOSIS — K121 Other forms of stomatitis: Secondary | ICD-10-CM | POA: Diagnosis not present

## 2021-05-11 DIAGNOSIS — R21 Rash and other nonspecific skin eruption: Secondary | ICD-10-CM | POA: Diagnosis not present

## 2021-05-11 DIAGNOSIS — Z8709 Personal history of other diseases of the respiratory system: Secondary | ICD-10-CM | POA: Diagnosis not present

## 2021-05-11 DIAGNOSIS — M0589 Other rheumatoid arthritis with rheumatoid factor of multiple sites: Secondary | ICD-10-CM | POA: Diagnosis not present

## 2021-05-11 DIAGNOSIS — Z79899 Other long term (current) drug therapy: Secondary | ICD-10-CM

## 2021-05-11 LAB — COMPREHENSIVE METABOLIC PANEL
Albumin: 3.9 (ref 3.5–5.0)
Calcium: 9.7 (ref 8.7–10.7)
Globulin: 3.6

## 2021-05-11 LAB — HEPATIC FUNCTION PANEL
ALT: 34 (ref 7–35)
AST: 24 (ref 13–35)
Alkaline Phosphatase: 94 (ref 25–125)
Bilirubin, Total: 0.2

## 2021-05-11 LAB — BASIC METABOLIC PANEL
BUN: 11 (ref 4–21)
CO2: 21 (ref 13–22)
Chloride: 104 (ref 99–108)
Glucose: 79
Potassium: 4.6 (ref 3.4–5.3)
Sodium: 140 (ref 137–147)

## 2021-05-11 LAB — CBC AND DIFFERENTIAL
HCT: 38 (ref 36–46)
Hemoglobin: 12.6 (ref 12.0–16.0)
Platelets: 418 — AB (ref 150–399)
WBC: 9.3

## 2021-05-11 LAB — LIPID PANEL
Cholesterol: 225 — AB (ref 0–200)
HDL: 66 (ref 35–70)
LDL Cholesterol: 128
Triglycerides: 179 — AB (ref 40–160)

## 2021-05-11 LAB — CBC: RBC: 4.22 (ref 3.87–5.11)

## 2021-05-11 MED ORDER — RINVOQ 15 MG PO TB24
15.0000 mg | ORAL_TABLET | Freq: Every day | ORAL | 5 refills | Status: DC
  Filled 2021-05-11 (×2): qty 30, 30d supply, fill #0

## 2021-05-11 MED ORDER — ETODOLAC 400 MG PO TABS
400.0000 mg | ORAL_TABLET | Freq: Two times a day (BID) | ORAL | 5 refills | Status: DC | PRN
  Filled 2021-05-11: qty 60, 30d supply, fill #0

## 2021-05-11 MED ORDER — RINVOQ 15 MG PO TB24
15.0000 mg | ORAL_TABLET | Freq: Every day | ORAL | 5 refills | Status: DC
  Filled 2021-05-11: qty 30, fill #0
  Filled 2021-05-15 – 2021-05-22 (×4): qty 30, 30d supply, fill #0
  Filled 2021-07-04: qty 30, 30d supply, fill #1

## 2021-05-11 NOTE — Progress Notes (Signed)
  S: Patient presents today for review of their specialty medication.   Patient is currently prescribed Rinvoq for rheumatoid arthritis. Patient is managed by Marella Chimes for this.   Dosing: Adult  Note: May be used as monotherapy or in combination with methotrexate or other nonbiologic disease-modifying antirheumatic drugs (DMARDs); use in combination with biologic DMARDS or potent immunosuppressants (eg, azathioprine, cyclosporine) is not recommended. Do not initiate therapy in patients with an absolute lymphocyte count <500/mm3, ANC <1,000/mm3, or hemoglobin <8 g/dL. Rheumatoid arthritis: Oral: 15 mg once daily.  Adherence: has not yet started  Efficacy: has not yet started  Monitoring: S/sx thromboembolism: has not yet started. No PMH of VTE or ASCVD.  S/sx malignancy: has not yet started S/sx of infection: has not yet started  Current adverse effects: has not yet started   O:     Lab Results  Component Value Date   WBC 5.0 05/26/2020   HGB 12.8 05/26/2020   HCT 39.6 05/26/2020   MCV 90.4 05/26/2020   PLT 377 05/26/2020      Chemistry      Component Value Date/Time   NA 139 05/26/2020 0841   NA 141 02/21/2014 0000   K 4.5 05/26/2020 0841   CL 103 05/26/2020 0841   CO2 24 05/26/2020 0841   BUN 14 05/26/2020 0841   BUN 13 02/21/2014 0000   CREATININE 0.87 05/26/2020 0841   GLU 93 02/21/2014 0000      Component Value Date/Time   CALCIUM 10.1 05/26/2020 0841   ALKPHOS 92 12/15/2018 1402   AST 13 05/26/2020 0841   ALT 14 05/26/2020 0841   BILITOT 0.3 05/26/2020 0841       Lab Results  Component Value Date   CHOL 216 (H) 05/26/2020   HDL 70 05/26/2020   LDLCALC 115 (H) 05/26/2020   LDLDIRECT 96.0 05/26/2019   TRIG 195 (H) 05/26/2020   CHOLHDL 3.1 05/26/2020     A/P: 1. Medication review: patient currently prescribed Rinvoq for rheumatoid arthritis. Reviewed the medication with the patient, including the following: Rinvoq is a medication used to treat  rheumatoid arthritis. Administer with or without food. Swallow tablet whole; do not crush, split, or chew. Possible adverse effects include increased risk of infection, GI upset, hematologic toxicity, hepatic effects, lipid abnormalities, increased risk of malignancy, thromboembolism. Avoid live vaccinations. No recommendations for any changes.  Benard Halsted, PharmD, [place], [place] [phone]

## 2021-05-13 MED FILL — Montelukast Sodium Tab 10 MG (Base Equiv): ORAL | 90 days supply | Qty: 90 | Fill #0 | Status: AC

## 2021-05-14 ENCOUNTER — Other Ambulatory Visit (HOSPITAL_COMMUNITY): Payer: Self-pay

## 2021-05-15 ENCOUNTER — Other Ambulatory Visit (HOSPITAL_COMMUNITY): Payer: Self-pay

## 2021-05-16 ENCOUNTER — Other Ambulatory Visit (HOSPITAL_COMMUNITY): Payer: Self-pay

## 2021-05-17 ENCOUNTER — Other Ambulatory Visit: Payer: Self-pay

## 2021-05-17 ENCOUNTER — Other Ambulatory Visit (HOSPITAL_COMMUNITY): Payer: Self-pay

## 2021-05-17 ENCOUNTER — Ambulatory Visit (INDEPENDENT_AMBULATORY_CARE_PROVIDER_SITE_OTHER): Payer: 59 | Admitting: Family Medicine

## 2021-05-17 ENCOUNTER — Encounter (INDEPENDENT_AMBULATORY_CARE_PROVIDER_SITE_OTHER): Payer: Self-pay | Admitting: Family Medicine

## 2021-05-17 VITALS — BP 142/84 | HR 83 | Temp 97.8°F | Ht 70.0 in | Wt 263.0 lb

## 2021-05-17 DIAGNOSIS — K2970 Gastritis, unspecified, without bleeding: Secondary | ICD-10-CM | POA: Diagnosis not present

## 2021-05-17 DIAGNOSIS — I10 Essential (primary) hypertension: Secondary | ICD-10-CM | POA: Diagnosis not present

## 2021-05-17 DIAGNOSIS — R5383 Other fatigue: Secondary | ICD-10-CM

## 2021-05-17 DIAGNOSIS — R0602 Shortness of breath: Secondary | ICD-10-CM | POA: Diagnosis not present

## 2021-05-17 DIAGNOSIS — Z1331 Encounter for screening for depression: Secondary | ICD-10-CM

## 2021-05-17 DIAGNOSIS — M069 Rheumatoid arthritis, unspecified: Secondary | ICD-10-CM | POA: Diagnosis not present

## 2021-05-17 DIAGNOSIS — F3289 Other specified depressive episodes: Secondary | ICD-10-CM | POA: Diagnosis not present

## 2021-05-17 DIAGNOSIS — Z0289 Encounter for other administrative examinations: Secondary | ICD-10-CM

## 2021-05-17 DIAGNOSIS — D649 Anemia, unspecified: Secondary | ICD-10-CM | POA: Diagnosis not present

## 2021-05-17 DIAGNOSIS — Z6841 Body Mass Index (BMI) 40.0 and over, adult: Secondary | ICD-10-CM

## 2021-05-17 DIAGNOSIS — E785 Hyperlipidemia, unspecified: Secondary | ICD-10-CM

## 2021-05-18 LAB — CBC WITH DIFFERENTIAL
Basophils Absolute: 0 10*3/uL (ref 0.0–0.2)
Basos: 1 %
EOS (ABSOLUTE): 0.3 10*3/uL (ref 0.0–0.4)
Eos: 6 %
Hematocrit: 39.9 % (ref 34.0–46.6)
Hemoglobin: 12.6 g/dL (ref 11.1–15.9)
Immature Grans (Abs): 0 10*3/uL (ref 0.0–0.1)
Immature Granulocytes: 0 %
Lymphocytes Absolute: 2.3 10*3/uL (ref 0.7–3.1)
Lymphs: 43 %
MCH: 29.1 pg (ref 26.6–33.0)
MCHC: 31.6 g/dL (ref 31.5–35.7)
MCV: 92 fL (ref 79–97)
Monocytes Absolute: 0.4 10*3/uL (ref 0.1–0.9)
Monocytes: 7 %
Neutrophils Absolute: 2.3 10*3/uL (ref 1.4–7.0)
Neutrophils: 43 %
RBC: 4.33 x10E6/uL (ref 3.77–5.28)
RDW: 13.1 % (ref 11.7–15.4)
WBC: 5.4 10*3/uL (ref 3.4–10.8)

## 2021-05-18 LAB — INSULIN, RANDOM: INSULIN: 16.3 u[IU]/mL (ref 2.6–24.9)

## 2021-05-18 LAB — COMPREHENSIVE METABOLIC PANEL
ALT: 33 IU/L — ABNORMAL HIGH (ref 0–32)
AST: 27 IU/L (ref 0–40)
Albumin/Globulin Ratio: 1.2 (ref 1.2–2.2)
Albumin: 4.2 g/dL (ref 3.8–4.8)
Alkaline Phosphatase: 96 IU/L (ref 44–121)
BUN/Creatinine Ratio: 14 (ref 9–23)
BUN: 11 mg/dL (ref 6–24)
Bilirubin Total: 0.3 mg/dL (ref 0.0–1.2)
CO2: 23 mmol/L (ref 20–29)
Calcium: 10.1 mg/dL (ref 8.7–10.2)
Chloride: 103 mmol/L (ref 96–106)
Creatinine, Ser: 0.77 mg/dL (ref 0.57–1.00)
Globulin, Total: 3.5 g/dL (ref 1.5–4.5)
Glucose: 75 mg/dL (ref 65–99)
Potassium: 4.8 mmol/L (ref 3.5–5.2)
Sodium: 140 mmol/L (ref 134–144)
Total Protein: 7.7 g/dL (ref 6.0–8.5)
eGFR: 97 mL/min/{1.73_m2} (ref >59)

## 2021-05-18 LAB — HEMOGLOBIN A1C
Est. average glucose Bld gHb Est-mCnc: 117 mg/dL
Hgb A1c MFr Bld: 5.7 % — ABNORMAL HIGH (ref 4.8–5.6)

## 2021-05-18 LAB — FOLATE: Folate: >20 ng/mL (ref >3.0)

## 2021-05-18 LAB — TSH: TSH: 0.955 u[IU]/mL (ref 0.450–4.500)

## 2021-05-18 LAB — VITAMIN B12: Vitamin B-12: 581 pg/mL (ref 232–1245)

## 2021-05-18 LAB — VITAMIN D 25 HYDROXY (VIT D DEFICIENCY, FRACTURES): Vit D, 25-Hydroxy: 75.2 ng/mL (ref 30.0–100.0)

## 2021-05-18 LAB — T4, FREE: Free T4: 1.22 ng/dL (ref 0.82–1.77)

## 2021-05-22 ENCOUNTER — Other Ambulatory Visit (HOSPITAL_COMMUNITY): Payer: Self-pay

## 2021-05-23 ENCOUNTER — Other Ambulatory Visit (HOSPITAL_COMMUNITY): Payer: Self-pay

## 2021-05-24 NOTE — Progress Notes (Signed)
[facility] [address] [place] Phone: [phone] Subjective:   I April [name] am serving as a Education administrator for Dr. Hulan Saas.  This visit occurred during the SARS-CoV-2 public health emergency.  Safety protocols were in place, including screening questions prior to the visit, additional usage of staff PPE, and extensive cleaning of exam room while observing appropriate contact time as indicated for disinfecting solutions.   I'm seeing this patient by the request  of:  Janith Lima, MD  CC: Back and neck pain follow-up  [id number]   April [name] is a 45 y.o. female coming in with complaint of back and neck pain. OMT 04/12/2021. Patient states her SI joints are still a problem. Otherwise everything is stable.  Patient continues to have difficulty with her autoimmune disease and has switch medications again with her rheumatologist.  Patient states that she is never without some type of pain in her lower back.  Medications patient has been prescribed: None   Reviewed patient's imaging 1 year ago including the plain x-rays of the back.  He does have retrolisthesis of L2 on L3 with mild multilevel degenerative disc disease that showed progression from patient's previous imaging in 2016.       Reviewed prior external information including notes and imaging from previsou exam, outside providers and external EMR if available.  This includes patient's imaging as described above  As well as notes that were available from care everywhere and other healthcare systems.  Past medical history, social, surgical and family history all reviewed in electronic medical record.  No pertanent information unless stated regarding to the chief complaint.   Past Medical History:  Diagnosis Date   ALLERGIC RHINITIS    Anal fissure    Anxiety    ANXIETY DEPRESSION    Back pain    CONSTIPATION    Constipation    DEPRESSION    Esophageal  reflux    Food allergy    [place]   Gastritis    GERD (gastroesophageal reflux disease)    HYPERCHOLESTEROLEMIA    Hyperlipidemia    Hypertension    OBESITY    Occipital neuralgia    Palpitations    Rheumatoid arthritis ([place]) 12/24/2017    Allergies  Allergen Reactions   Ciprofloxacin     toxicity   Levaquin [Levofloxacin] Anxiety    Possible CNS side effects. TOXICITY   Moxifloxacin     TOXICITY     Review of Systems:  No headache, visual changes, nausea, vomiting, diarrhea, constipation, dizziness, abdominal pain, skin rash, fevers, chills, night sweats, weight loss, swollen lymph nodes,  chest pain, shortness of breath, mood changes. POSITIVE muscle aches, body aches, joint swelling  Objective  Blood pressure 136/80, pulse 85, height 5\' 10"  (1.778 m), weight 263 lb (119.3 kg), SpO2 99 %.   General: No apparent distress alert and oriented x3 mood and affect normal, dressed appropriately.  HEENT: Pupils equal, extraocular movements intact  Respiratory: Patient's speak in full sentences and does not appear short of breath  Cardiovascular: No lower extremity edema, non tender, no erythema  Patient's back exam does show some tightness noted.  Tenderness to palpation severely over the SI joints and some paraspinal musculature.  Patient does have worsening pain with extension of the back.  Tightness noted with FABER test bilaterally.  Osteopathic findings  C2 flexed rotated and side bent right C6 flexed rotated and side bent left T3 extended rotated and side bent right  inhaled rib T9 extended rotated and side bent left L2 flexed rotated and side bent right Sacrum right on right       Assessment and Plan:  Low back pain Patient has had low back pain with intermittent radicular symptoms previously.  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  Discussed icing regimen and home  exercises.  I do feel at this point that with patient's other underlying problems MRI would be beneficial to further evaluate to see if this is more bony abnormality versus possible reaction to the autoimmune disease.  Patient will increase activity slowly otherwise.  Discussed icing regimen and home exercises.  Attempted osteopathic manipulation which made some mild improvement but nothing significant and patient has not made significant breakthrough in quite some time.  Patient will follow-up after imaging and discuss further.   Nonallopathic problems  Decision today to treat with OMT was based on Physical Exam  After verbal consent patient was treated with HVLA, ME, FPR techniques in cervical, rib, thoracic, lumbar, and sacral  areas avoided HVLA on the cervical spine  Patient tolerated the procedure well with improvement in symptoms  Patient given exercises, stretches and lifestyle modifications  See medications in patient instructions if given  Patient will follow up in 4-8 weeks     The above documentation has been reviewed and is accurate and complete Lyndal Pulley, DO        Note: This dictation was prepared with Dragon dictation along with smaller phrase technology. Any transcriptional errors that result from this process are unintentional.

## 2021-05-28 NOTE — Progress Notes (Signed)
Chief Complaint:   OBESITY April [name] (MR# [id number]) is a 45 y.o. female who presents for evaluation and treatment of obesity and related comorbidities. Current BMI is Body mass index is 37.74 kg/m. April [name] has been struggling with her weight for many years and has been unsuccessful in either losing weight, maintaining weight loss, or reaching her healthy weight goal.  April [name] is currently in the action stage of change and ready to dedicate time achieving and maintaining a healthier weight. April [name] is interested in becoming our patient and working on intensive lifestyle modifications including (but not limited to) diet and exercise for weight loss.  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  She lives with her husband, April [name].  Weight Watchers worked well in the past.  Eats out 8-10 times per week or more.  Increased cravings after lunch and dinner for "sugar", pizza, chocolate, candy, cookies.  Drinks caloric beverages.  Worst habit is eating even when full.  Tried Contrave in the past - many years.     Had labs with Rheumatology last week:  CBC, CMP, FLP.  She will bring in a copy to her next office visit.  April [name]'s habits were reviewed today and are as follows: Her family eats meals together, she thinks her family will eat healthier with her, she struggles with family and or coworkers weight loss sabotage, her desired weight loss is 83 pounds, she has been heavy most of her life, she started gaining weight at around 45 years of age, her heaviest weight ever was her current weight, she craves sugar and carbs, she snacks frequently in the evenings, she frequently makes poor food choices, she has problems with excessive hunger, she frequently eats larger portions than normal, and she struggles with emotional eating.  Depression Screen April [name] (modified PHQ-9) score was 17.  Depression screen April [name] 2/9 05/17/2021  Decreased Interest 3  Down, Depressed, Hopeless 3  PHQ - 2 Score  6  Altered sleeping 3  Tired, decreased energy 0  Change in appetite 3  Feeling bad or failure about yourself  3  Trouble concentrating 2  Moving slowly or fidgety/restless 0  Suicidal thoughts 0  PHQ-9 Score 17  Difficult doing work/chores Not difficult at all  Some recent data might be hidden   Assessment/Plan:   Orders Placed This Encounter  Procedures   Insulin, random   Hemoglobin A1c   Vitamin B12   VITAMIN D 25 Hydroxy (Vit-D Deficiency, Fractures)   TSH   Folate   T4, free   CBC and differential   CBC   Basic metabolic panel   Comprehensive metabolic panel   Lipid panel   Hepatic function panel   CBC With Differential   Comprehensive metabolic panel   EKG 46-[id number]   Medications Discontinued During This Encounter  Medication Reason   celecoxib (CELEBREX) 200 MG capsule Error   etonogestrel-ethinyl estradiol (NUVARING) 0.12-0.015 MG/24HR vaginal ring Error   famotidine (PEPCID) 40 MG tablet Error   methotrexate 50 MG/2ML injection Error   methotrexate 50 MG/2ML injection Error   methotrexate 50 MG/2ML injection Error    1. Other fatigue April [name] denies daytime somnolence and reports waking up still tired. Patent has a history of symptoms of morning fatigue and snoring. April [name] generally gets 7 or 8 hours of sleep per night, and states that she has generally restful sleep. Snoring is present. Apneic episodes are not present. Epworth Sleepiness Score is 8.  Sleep study in the past was  negative for OSA.  Labs and EKG today.  April [name] does feel that her weight is causing her energy to be lower than it should be. Fatigue may be related to obesity, depression or many other causes. Labs will be ordered, and in the meanwhile, April [name] will focus on self care including making healthy food choices, increasing physical activity and focusing on stress reduction.  - Insulin, random - Hemoglobin A1c - Vitamin B12 - VITAMIN D 25 Hydroxy (Vit-D Deficiency, Fractures) - TSH -  Folate - EKG 12-Lead - T4, free  2. SOB (shortness of breath) on exertion April [name] notes increasing shortness of breath with exercising and seems to be worsening over time with weight gain. She notes getting out of breath sooner with activity than she used to. This has gotten worse recently. April [name] denies shortness of breath at rest or orthopnea.  April [name] does feel that she gets out of breath more easily that she used to when she exercises. April [name]'s shortness of breath appears to be obesity related and exercise induced. She has agreed to work on weight loss and gradually increase exercise to treat her exercise induced shortness of breath. Will continue to monitor closely.  IC today.  3. Rheumatoid arthritis, involving unspecified site, unspecified whether rheumatoid factor present April [name]) [name], Dr. Lenna Gilford, for management.  Limits some activities, but can walk, etc.  Taking Neurontin 300 mg daily and Rinvoq 15 mg daily.  Plan:  Stable currently.  Treatment plan per Rheumatology.  She is able to walk/exercise in the future.  4. Hyperlipidemia, unspecified hyperlipidemia type Course: Not at goal. Lipid-lowering medications: fenofibrate 160 mg daily.   Plan:  CMP and FLP recently done.  Treatment per PCP.  Dietary changes: Increase soluble fiber, decrease simple carbohydrates, decrease saturated fat. Exercise changes: Moderate to vigorous-intensity aerobic activity 150 minutes per week or as tolerated. We will continue to monitor along with PCP/specialists as it pertains to her weight loss journey.  Lab Results  Component Value Date   CHOL 225 (A) 05/11/2021   HDL 66 05/11/2021   LDLCALC 128 05/11/2021   LDLDIRECT 96.0 05/26/2019   TRIG 179 (A) 05/11/2021   CHOLHDL 3.1 05/26/2020   Lab Results  Component Value Date   ALT 33 (H) 05/17/2021   AST 27 05/17/2021   ALKPHOS 96 05/17/2021   BILITOT 0.3 05/17/2021   The 10-year ASCVD risk score April [name] DC Jr., et al., 2013)  is: 1.1%*   Values used to calculate the score:     Age: 62 years     Sex: Female     Is Non-Hispanic African American: No     Diabetic: No     Tobacco smoker: No     Systolic Blood Pressure: 161 mmHg     Is BP treated: Yes     HDL Cholesterol: 66 mg/dL*     Total Cholesterol: 225 mg/dL*     * - Cholesterol units were assumed for this score calculation  5. Hypertension, unspecified type Elevated slightly today.  Medications: losartan 50 mg daily.  With chronic history of palpitations.  Stable now.  Blood pressure runs 120s/80s typically.  Plan:  a little high today in office.  Will monitor closely.  Follow prudent nutritional plan and lose weight is the goal.  Avoid buying foods that are: processed, frozen, or prepackaged to avoid excess salt. We will watch for signs of hypotension as she continues lifestyle modifications. We will continue to monitor closely alongside her PCP and/or Specialist.  Regular  follow up with PCP and specialists was also encouraged.   BP Readings from Last 3 Encounters:  05/17/21 (!) 142/84  04/12/21 124/80  02/21/21 130/82   Lab Results  Component Value Date   CREATININE 0.77 05/17/2021   6. Gastritis, presence of bleeding unspecified, unspecified chronicity, unspecified gastritis type With history of severe GERD.  Treatment by GI.  Taking Nexium 60 mg daily.  Plan:  Stable.  Continue medications per GI.  7. Other depression, with emotional eating Not at goal. Medication: Buspar 15 mg twice daily and Lexapro 20 mg daily.  With anxiety.  PHQ-9 17.  No SI.  Seen by Psychiatry 1-2 years ago.  Treated by PCP now.  Does not feel she needs Psychiatry currently.  Plan: [name], per patient, is stable.  No concerns today.  Continue medications per PCP.  Patient was referred to April [name], our Bariatric Psychologist, for evaluation due to her elevated PHQ-9 score and significant struggles with emotional eating.  8. Obesity with current BMI 37.74  April [name] is  currently in the action stage of change and her goal is to continue with weight loss efforts. I recommend April [name] begin the structured treatment plan as follows:  She has agreed to the Category 2 Plan.  Exercise goals: No exercise has been prescribed at this time.   Behavioral modification strategies: increasing lean protein intake, decreasing simple carbohydrates, meal planning and cooking strategies, and planning for success.  She was informed of the importance of frequent follow-up visits to maximize her success with intensive lifestyle modifications for her multiple health conditions. She was informed we would discuss her lab results at her next visit unless there is a critical issue that needs to be addressed sooner. April [name] agreed to keep her next visit at the agreed upon time to discuss these results.  Objective:   Blood pressure (!) 142/84, pulse 83, temperature 97.8 F (36.6 C), height 5\' 10"  (1.778 m), weight 263 lb (119.3 kg), SpO2 97 %. Body mass index is 37.74 kg/m.  EKG: Normal sinus rhythm, rate 77 bpm.  Indirect Calorimeter completed today shows a VO2 of 248 and a REE of 1727.  Her calculated basal metabolic rate is 0973 thus her basal metabolic rate is worse than expected.  General: Cooperative, alert, well developed, in no acute distress. HEENT: Conjunctivae and lids unremarkable. Cardiovascular: Regular rhythm.  Lungs: Normal work of breathing. Neurologic: No focal deficits.   Lab Results  Component Value Date   CREATININE 0.77 05/17/2021   BUN 11 05/17/2021   NA 140 05/17/2021   K 4.8 05/17/2021   CL 103 05/17/2021   CO2 23 05/17/2021   Lab Results  Component Value Date   ALT 33 (H) 05/17/2021   AST 27 05/17/2021   ALKPHOS 96 05/17/2021   BILITOT 0.3 05/17/2021   Lab Results  Component Value Date   HGBA1C 5.7 (H) 05/17/2021   HGBA1C 5.4 05/26/2020   HGBA1C 5.5 05/26/2019   HGBA1C 5.6 05/22/2018   HGBA1C 5.8 05/06/2017   Lab Results  Component  Value Date   INSULIN 16.3 05/17/2021   Lab Results  Component Value Date   TSH 0.955 05/17/2021   Lab Results  Component Value Date   CHOL 225 (A) 05/11/2021   HDL 66 05/11/2021   LDLCALC 128 05/11/2021   LDLDIRECT 96.0 05/26/2019   TRIG 179 (A) 05/11/2021   CHOLHDL 3.1 05/26/2020   Lab Results  Component Value Date   VD25OH 75.2 05/17/2021   VD25OH 59 05/26/2020  VD25OH 41.41 05/06/2017   Lab Results  Component Value Date   WBC 5.4 05/17/2021   HGB 12.6 05/17/2021   HCT 39.9 05/17/2021   MCV 92 05/17/2021   PLT 418 (A) 05/11/2021   Lab Results  Component Value Date   IRON 79 09/18/2017   Attestation Statements:   This is the patient's first visit at [facility]. The patient's NEW PATIENT PACKET was reviewed at length. Included in the packet: current and past health history, medications, allergies, ROS, gynecologic history (women only), surgical history, family history, social history, weight history, weight loss surgery history (for those that have had weight loss surgery), nutritional evaluation, [name] and food questionnaire, PHQ9, Epworth questionnaire, sleep habits questionnaire, patient life and health improvement goals questionnaire. These will all be scanned into the patient's chart under media.   During the visit, I independently reviewed the patient's EKG, bioimpedance scale results, and indirect calorimeter results. I used this information to tailor a meal plan for the patient that will help her to lose weight and will improve her obesity-related conditions going forward. I performed a medically necessary appropriate examination and/or evaluation. I discussed the assessment and treatment plan with the patient. The patient was provided an opportunity to ask questions and all were answered. The patient agreed with the plan and demonstrated an understanding of the instructions. Labs were ordered at this visit and will be reviewed at the next visit unless  more critical results need to be addressed immediately. Clinical information was updated and documented in the EMR.   Time spent on visit including pre-visit chart review and post-visit charting and care was 62 minutes.   I, Water quality scientist, CMA, am acting as Location manager for [employer], DO.  I have reviewed the above documentation for accuracy and completeness, and I agree with the above. Marjory Sneddon, D.O.  The [place] was signed into law in 2016 which includes the topic of electronic health records.  This provides immediate access to information in MyChart.  This includes consultation notes, operative notes, office notes, lab results and pathology reports.  If you have any questions about what you read please let us know at your next visit so we can discuss your concerns and take corrective action if need be.  We are right here with you.

## 2021-05-30 ENCOUNTER — Other Ambulatory Visit (HOSPITAL_COMMUNITY): Payer: Self-pay

## 2021-05-30 ENCOUNTER — Encounter: Payer: Self-pay | Admitting: Internal Medicine

## 2021-05-30 ENCOUNTER — Other Ambulatory Visit: Payer: Self-pay

## 2021-05-30 ENCOUNTER — Ambulatory Visit (INDEPENDENT_AMBULATORY_CARE_PROVIDER_SITE_OTHER): Payer: 59 | Admitting: Internal Medicine

## 2021-05-30 VITALS — BP 132/84 | HR 76 | Temp 98.4°F | Ht 70.0 in | Wt 264.0 lb

## 2021-05-30 DIAGNOSIS — F341 Dysthymic disorder: Secondary | ICD-10-CM

## 2021-05-30 DIAGNOSIS — D75839 Thrombocytosis, unspecified: Secondary | ICD-10-CM | POA: Insufficient documentation

## 2021-05-30 DIAGNOSIS — Z0001 Encounter for general adult medical examination with abnormal findings: Secondary | ICD-10-CM | POA: Diagnosis not present

## 2021-05-30 DIAGNOSIS — L02211 Cutaneous abscess of abdominal wall: Secondary | ICD-10-CM | POA: Diagnosis not present

## 2021-05-30 DIAGNOSIS — R7989 Other specified abnormal findings of blood chemistry: Secondary | ICD-10-CM | POA: Diagnosis not present

## 2021-05-30 DIAGNOSIS — F5104 Psychophysiologic insomnia: Secondary | ICD-10-CM | POA: Diagnosis not present

## 2021-05-30 LAB — IRON: Iron: 82 ug/dL (ref 42–145)

## 2021-05-30 LAB — FERRITIN: Ferritin: 290.9 ng/mL (ref 10.0–291.0)

## 2021-05-30 MED ORDER — SULFAMETHOXAZOLE-TRIMETHOPRIM 800-160 MG PO TABS
1.0000 | ORAL_TABLET | Freq: Two times a day (BID) | ORAL | 0 refills | Status: AC
  Filled 2021-05-30: qty 14, 7d supply, fill #0

## 2021-05-30 MED ORDER — BUSPIRONE HCL 15 MG PO TABS
15.0000 mg | ORAL_TABLET | Freq: Two times a day (BID) | ORAL | 1 refills | Status: DC
  Filled 2021-05-30: qty 180, 90d supply, fill #0
  Filled 2022-05-28: qty 180, 90d supply, fill #1

## 2021-05-30 MED ORDER — RIFAMPIN 300 MG PO CAPS
300.0000 mg | ORAL_CAPSULE | Freq: Two times a day (BID) | ORAL | 0 refills | Status: AC
  Filled 2021-05-30: qty 14, 7d supply, fill #0

## 2021-05-30 MED ORDER — ZOLPIDEM TARTRATE 5 MG PO TABS
5.0000 mg | ORAL_TABLET | Freq: Every evening | ORAL | 1 refills | Status: DC | PRN
  Filled 2021-05-30: qty 90, 90d supply, fill #0

## 2021-05-30 NOTE — Patient Instructions (Signed)
Skin Abscess  A skin abscess is an infected area on or under your skin that contains a collection of pus and other material. An abscess may also be called a furuncle,carbuncle, or boil. An abscess can occur in or on almost any part of your body. Some abscesses break open (rupture) on their own. Most continue to get worse unless they are treated. The infection can spread deeper into the body and eventually into your blood, whichcan make you feel ill. Treatment usually involves draining the abscess. What are the causes? An abscess occurs when germs, like bacteria, pass through your skin and cause an infection. This may be caused by: A scrape or cut on your skin. A puncture wound through your skin, including a needle injection or insect bite. Blocked oil or sweat glands. Blocked and infected hair follicles. A cyst that forms beneath your skin (sebaceous cyst) and becomes infected. What increases the risk? This condition is more likely to develop in people who: Have a weak body defense system (immune system). Have diabetes. Have dry and irritated skin. Get frequent injections or use illegal IV drugs. Have a foreign body in a wound, such as a splinter. Have problems with their lymph system or veins. What are the signs or symptoms? Symptoms of this condition include: A painful, firm bump under the skin. A bump with pus at the top. This may break through the skin and drain. Other symptoms include: Redness surrounding the abscess site. Warmth. Swelling of the lymph nodes (glands) near the abscess. Tenderness. A sore on the skin. How is this diagnosed? This condition may be diagnosed based on: A physical exam. Your medical history. A sample of pus. This may be used to find out what is causing the infection. Blood tests. Imaging tests, such as an ultrasound, CT scan, or MRI. How is this treated? A small abscess that drains on its own may not need treatment. Treatment for larger abscesses  may include: Moist heat or heat pack applied to the area several times a day. A procedure to drain the abscess (incision and drainage). Antibiotic medicines. For a severe abscess, you may first get antibiotics through an IV and then change to antibiotics by mouth. Follow these instructions at home: Medicines  Take over-the-counter and prescription medicines only as told by your health care provider. If you were prescribed an antibiotic medicine, take it as told by your health care provider. Do not stop taking the antibiotic even if you start to feel better.  Abscess care  If you have an abscess that has not drained, apply heat to the affected area. Use the heat source that your health care provider recommends, such as a moist heat pack or a heating pad. Place a towel between your skin and the heat source. Leave the heat on for 20-30 minutes. Remove the heat if your skin turns bright red. This is especially important if you are unable to feel pain, heat, or cold. You may have a greater risk of getting burned. Follow instructions from your health care provider about how to take care of your abscess. Make sure you: Cover the abscess with a bandage (dressing). Change your dressing or gauze as told by your health care provider. Wash your hands with soap and water before you change the dressing or gauze. If soap and water are not available, use hand sanitizer. Check your abscess every day for signs of a worsening infection. Check for: More redness, swelling, or pain. More fluid or blood. Warmth. More   pus or a bad smell.  General instructions To avoid spreading the infection: Do not share personal care items, towels, or hot tubs with others. Avoid making skin contact with other people. Keep all follow-up visits as told by your health care provider. This is important. Contact a health care provider if you have: More redness, swelling, or pain around your abscess. More fluid or blood coming  from your abscess. Warm skin around your abscess. More pus or a bad smell coming from your abscess. A fever. Muscle aches. Chills or a general ill feeling. Get help right away if you: Have severe pain. See red streaks on your skin spreading away from the abscess. Summary A skin abscess is an infected area on or under your skin that contains a collection of pus and other material. A small abscess that drains on its own may not need treatment. Treatment for larger abscesses may include having a procedure to drain the abscess and taking an antibiotic. This information is not intended to replace advice given to you by your health care provider. Make sure you discuss any questions you have with your healthcare provider. Document Revised: 02/25/2019 Document Reviewed: 12/18/2017 Elsevier Patient Education  2022 [employer]  

## 2021-05-30 NOTE — Progress Notes (Addendum)
Subjective:  [name], female    DOB: 1976-11-09  Age: 45 y.o. MRN: [id number]  CC: Annual Exam  This visit occurred during the SARS-CoV-2 public health emergency.  Safety protocols were in place, including screening questions prior to the visit, additional usage of staff PPE, and extensive cleaning of exam room while observing appropriate contact time as indicated for disinfecting solutions.    HPI April [name] presents for a CPX and f/up -  She recently saw a rheumatologist and lab work showed an elevated platelet count and mildly elevated liver enzymes.  She denies abdominal pain, nausea, vomiting, diarrhea, bleeding, or bruising.  She complains of several week history of redness and swelling over her lower abdomen.  The largest area over the right lower abdomen over the last week has been draining purulent exudate.  She has a history of staph infections.  Outpatient Medications Prior to Visit  Medication Sig Dispense Refill   ascorbic acid (VITAMIN C) 1000 MG tablet Take 1,000 mg by mouth daily.     celecoxib (CELEBREX) 200 MG capsule Take 1 capsule (200 mg total) by mouth 2 (two) times daily with food. 60 capsule 2   cetirizine (ZYRTEC) 10 MG tablet Take 1 tablet (10 mg total) by mouth daily. 30 tablet 3   Cholecalciferol (VITAMIN D3) 125 MCG (5000 UT) CAPS Take 1 capsule by mouth daily.     clindamycin (CLINDAGEL) 1 % gel APPLY TOPICALLY TO AFFECTED AREA ON THE SKIN TWICE DAILY. 60 g 1   clobetasol ointment (TEMOVATE) 0.05 % APPLY TO AFFECTED AREA TWICE DAILY FOR 10 DAYS  0   escitalopram (LEXAPRO) 20 MG tablet Take 1 tablet (20 mg total) by mouth daily. 90 tablet 0   esomeprazole (NEXIUM) 20 MG capsule Take 20 mg by mouth daily at 12 noon.     esomeprazole (NEXIUM) 40 MG capsule TAKE 1 CAPSULE (40 MG TOTAL) BY MOUTH DAILY. 90 capsule 3   etodolac (LODINE) 400 MG tablet Take 1 tablet (400 mg total) by mouth 2 (two) times daily as needed with food 60 tablet  5   etonogestrel-ethinyl estradiol (NUVARING) 0.12-0.015 MG/24HR vaginal ring REMOVE AND INSERT A NEW RING INTO THE VAGINA EVERY 4 WEEKS *USE CONTINUOUSLY* (Patient taking differently: REMOVE AND INSERT A NEW RING INTO THE VAGINA EVERY 4 WEEKS *USE CONTINUOUSLY*) 3 each 4   fenofibrate 160 MG tablet TAKE 1 TABLET (160 MG TOTAL) BY MOUTH DAILY. 90 tablet 3   folic acid (FOLVITE) 1 MG tablet TAKE 1 TABLET BY MOUTH DAILY 90 tablet 6   gabapentin (NEURONTIN) 300 MG capsule Take 1 capsule by mouth once daily. 90 capsule 1   Ibuprofen-Famotidine (DUEXIS) 800-26.6 MG TABS Take 800 mg by mouth 3 (three) times daily. 90 tablet 3   losartan (COZAAR) 50 MG tablet TAKE 1 TABLET (50 MG TOTAL) BY MOUTH DAILY. 90 tablet 1   MAGNESIUM MALATE PO Take 1 tablet by mouth at bedtime.     montelukast (SINGULAIR) 10 MG tablet TAKE 1 TABLET (10 MG TOTAL) BY MOUTH AT BEDTIME. 90 tablet 3   Multiple Vitamin (MULTIVITAMIN) tablet Take 1 tablet by mouth daily.     Omega-3 Fatty Acids (FISH OIL) 1000 MG CAPS Take 2 capsules by mouth 2 (two) times daily.      Probiotic Product (SOLUBLE FIBER/PROBIOTICS PO) Take 1 capsule by mouth daily.      Upadacitinib ER (RINVOQ) 15 MG TB24 Take 1 tablet by mouth daily. 30 tablet 5  busPIRone (BUSPAR) 15 MG tablet Take 15 mg by mouth 2 (two) times daily.     zolpidem (AMBIEN) 5 MG tablet Take 5 mg by mouth at bedtime as needed for sleep.     nystatin-triamcinolone ointment (MYCOLOG) APPLY ON THE SKIN TWICE DAILY 30 g 0   predniSONE (DELTASONE) 20 MG tablet TAKE AS DIRECTED BY MOUTH ONCE A DAY FOR 8 DAYS. 18 tablet 0   No facility-administered medications prior to visit.    ROS Review of Systems  Constitutional:  Negative for diaphoresis and fatigue.  HENT: Negative.    Eyes: Negative.   Respiratory:  Negative for cough and shortness of breath.   Cardiovascular:  Negative for chest pain, palpitations and leg swelling.  Gastrointestinal:  Negative for abdominal pain, constipation,  diarrhea, nausea and vomiting.  Endocrine: Negative.   Genitourinary: Negative.  Negative for difficulty urinating.  Musculoskeletal:  Positive for arthralgias. Negative for myalgias.  Skin:  Positive for color change and rash.  Neurological:  Negative for dizziness, weakness and headaches.  Hematological:  Negative for adenopathy. Does not bruise/bleed easily.  Psychiatric/Behavioral:  Positive for sleep disturbance. Negative for confusion, decreased concentration, dysphoric mood, self-injury and suicidal ideas. The patient is nervous/anxious.    Objective:  BP 132/84 (BP Location: Right Arm, Patient Position: Sitting, Cuff Size: Large)   Pulse 76   Temp 98.4 F (36.9 C) (Oral)   Ht 5\' 10"  (1.778 m)   Wt 264 lb (119.7 kg)   SpO2 98%   BMI 37.88 kg/m   BP Readings from Last 3 Encounters:  05/31/21 123/77  05/31/21 136/80  05/30/21 132/84    Wt Readings from Last 3 Encounters:  05/31/21 259 lb (117.5 kg)  05/31/21 263 lb (119.3 kg)  05/30/21 264 lb (119.7 kg)    Physical Exam Vitals reviewed.  Constitutional:      Appearance: Normal appearance.  HENT:     Nose: Nose normal.     Mouth/Throat:     Mouth: Mucous membranes are moist.  Eyes:     Conjunctiva/sclera: Conjunctivae normal.  Cardiovascular:     Rate and Rhythm: Normal rate and regular rhythm.     Heart sounds: No murmur heard. Pulmonary:     Effort: Pulmonary effort is normal.     Breath sounds: No stridor. No wheezing, rhonchi or rales.  Abdominal:     General: Abdomen is protuberant. Bowel sounds are normal. There is no distension.     Palpations: Abdomen is soft. There is no hepatomegaly, splenomegaly or mass.     Tenderness: There is no abdominal tenderness.    Musculoskeletal:        General: Normal range of motion.     Cervical back: Neck supple.  Lymphadenopathy:     Cervical: No cervical adenopathy.  Skin:    Findings: Erythema and rash present.  Neurological:     General: No focal deficit  present.     Mental Status: She is alert.  Psychiatric:        Mood and Affect: Mood normal.    Lab Results  Component Value Date   WBC 5.4 05/17/2021   HGB 12.6 05/17/2021   HCT 39.9 05/17/2021   PLT 418 (A) 05/11/2021   GLUCOSE 75 05/17/2021   CHOL 225 (A) 05/11/2021   TRIG 179 (A) 05/11/2021   HDL 66 05/11/2021   LDLDIRECT 96.0 05/26/2019   LDLCALC 128 05/11/2021   ALT 33 (H) 05/17/2021   AST 27 05/17/2021   NA 140 05/17/2021  K 4.8 05/17/2021   CL 103 05/17/2021   CREATININE 0.77 05/17/2021   BUN 11 05/17/2021   CO2 23 05/17/2021   TSH 0.955 05/17/2021   HGBA1C 5.7 (H) 05/17/2021    CT Angio Chest PE W and/or Wo Contrast  Result Date: 12/15/2018 CLINICAL DATA:  Chest pain radiating to the left jaw. Dizziness and shortness of breath. Weakness for 2 days. EXAM: CT ANGIOGRAPHY CHEST WITH CONTRAST TECHNIQUE: Multidetector CT imaging of the chest was performed using the standard protocol during bolus administration of intravenous contrast. Multiplanar CT image reconstructions and MIPs were obtained to evaluate the vascular anatomy. CONTRAST:  39mL ISOVUE-370 IOPAMIDOL (ISOVUE-370) INJECTION 76% COMPARISON:  Current chest radiograph. FINDINGS: Cardiovascular: There is satisfactory opacification of the pulmonary arteries to the segmental level. There is no evidence of a pulmonary embolism. Heart is normal in size and configuration. No coronary artery calcifications. No pericardial effusion. Great vessels are normal in caliber. No aortic dissection or atherosclerosis. Aortic arch branch vessels are widely patent. Mediastinum/Nodes: No enlarged mediastinal, hilar, or axillary lymph nodes. Thyroid gland, trachea, and esophagus demonstrate no significant findings. Lungs/Pleura: Minimal dependent subsegmental atelectasis, most evident in the left lower lobe. Lungs otherwise clear. No pleural effusion or pneumothorax. Upper Abdomen: Unremarkable. Musculoskeletal: No fracture or acute finding.  No osteoblastic or osteolytic lesions. Mild midthoracic spine disc degenerative changes. Mild thoracic scoliosis. Review of the MIP images confirms the above findings. IMPRESSION: 1. No evidence of a pulmonary embolism. 2. No acute findings. Electronically Signed   By: Lajean Manes M.D.   On: 12/15/2018 17:45   DG Chest Port 1 View  Result Date: 12/15/2018 CLINICAL DATA:  Shortness of breath since yesterday. Some chest pain. EXAM: PORTABLE CHEST 1 VIEW COMPARISON:  12/20/2016 FINDINGS: Cardiac silhouette is normal in size. No mediastinal or hilar masses. There is no evidence of adenopathy. Prominent bronchovascular markings noted bilaterally. Lungs otherwise clear. No pleural effusion or pneumothorax. Skeletal structures are grossly intact. IMPRESSION: No active disease. Electronically Signed   By: Lajean Manes M.D.   On: 12/15/2018 14:39    Assessment & Plan:   April [name] was seen today for annual exam.  Diagnoses and all orders for this visit:  Elevated LFTs- Testing for viral hepatitis is negative. I recommended that she get vaccinated against Hep A and B. Will get an U/S to see if she has NASH. -     Hepatitis B surface antigen; Future -     Hepatitis B surface antibody,quantitative; Future -     Hepatitis C antibody; Future -     Hepatitis B core antibody, total; Future -     Hepatitis A antibody, total; Future -     Hepatitis A antibody, total -     Hepatitis B core antibody, total -     Hepatitis C antibody -     Hepatitis B surface antibody,quantitative -     Hepatitis B surface antigen -     US Abdomen Limited RUQ (LIVER/GB); Future  Thrombocytosis- Her iron level is normal. 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 -     Iron; Future -     Ferritin; Future -     Ferritin -     Iron  Encounter for general adult medical examination with abnormal findings- Exam completed, labs reviewed, vaccines are UTD, cancer screenings are UTD, pt ed material was given.  ANXIETY DEPRESSION -     busPIRone  (BUSPAR) 15 MG tablet; Take 1 tablet (15 mg total) by mouth 2 (  two) times daily.  Psychophysiological insomnia -     zolpidem (AMBIEN) 5 MG tablet; Take 1 tablet (5 mg total) by mouth at bedtime as needed for sleep.  Abscess of skin of abdomen- This is likely MRSA. Will treat with SMX/TMP and rifampin to eradicate any reservoir of MRSA.  She agrees to use a barrier contraceptive measure for the next month. -     sulfamethoxazole-trimethoprim (BACTRIM DS) 800-160 MG tablet; Take 1 tablet by mouth 2 (two) times daily for 7 days. -     rifampin (RIFADIN) 300 MG capsule; Take 1 capsule (300 mg total) by mouth 2 (two) times daily for 7 days.  I have discontinued April [name]'s predniSONE and nystatin-triamcinolone ointment. I have also changed her busPIRone and zolpidem. Additionally, I am having her start on sulfamethoxazole-trimethoprim and rifampin. Lastly, I am having her maintain her multivitamin, cetirizine, Fish Oil, Probiotic Product (SOLUBLE FIBER/PROBIOTICS PO), ascorbic acid, clobetasol ointment, Vitamin D3, MAGNESIUM MALATE PO, Duexis, clindamycin, losartan, etonogestrel-ethinyl estradiol, montelukast, folic acid, fenofibrate, esomeprazole, gabapentin, celecoxib, escitalopram, etodolac, Rinvoq, and esomeprazole.  Meds ordered this encounter  Medications   busPIRone (BUSPAR) 15 MG tablet    Sig: Take 1 tablet (15 mg total) by mouth 2 (two) times daily.    Dispense:  180 tablet    Refill:  1   zolpidem (AMBIEN) 5 MG tablet    Sig: Take 1 tablet (5 mg total) by mouth at bedtime as needed for sleep.    Dispense:  90 tablet    Refill:  1   sulfamethoxazole-trimethoprim (BACTRIM DS) 800-160 MG tablet    Sig: Take 1 tablet by mouth 2 (two) times daily for 7 days.    Dispense:  14 tablet    Refill:  0   rifampin (RIFADIN) 300 MG capsule    Sig: Take 1 capsule (300 mg total) by mouth 2 (two) times daily for 7 days.    Dispense:  14 capsule    Refill:  0      Follow-up: Return  in about 3 weeks (around 06/20/2021).  Scarlette Calico, MD

## 2021-05-31 ENCOUNTER — Ambulatory Visit: Payer: 59 | Admitting: Family Medicine

## 2021-05-31 ENCOUNTER — Encounter: Payer: Self-pay | Admitting: Family Medicine

## 2021-05-31 ENCOUNTER — Other Ambulatory Visit (HOSPITAL_COMMUNITY): Payer: Self-pay

## 2021-05-31 ENCOUNTER — Encounter: Payer: Self-pay | Admitting: Internal Medicine

## 2021-05-31 ENCOUNTER — Ambulatory Visit (INDEPENDENT_AMBULATORY_CARE_PROVIDER_SITE_OTHER): Payer: 59 | Admitting: Family Medicine

## 2021-05-31 VITALS — BP 123/77 | HR 89 | Temp 98.4°F | Ht 70.0 in | Wt 259.0 lb

## 2021-05-31 VITALS — BP 136/80 | HR 85 | Ht 70.0 in | Wt 263.0 lb

## 2021-05-31 DIAGNOSIS — M5442 Lumbago with sciatica, left side: Secondary | ICD-10-CM | POA: Diagnosis not present

## 2021-05-31 DIAGNOSIS — M9902 Segmental and somatic dysfunction of thoracic region: Secondary | ICD-10-CM | POA: Diagnosis not present

## 2021-05-31 DIAGNOSIS — M545 Low back pain, unspecified: Secondary | ICD-10-CM

## 2021-05-31 DIAGNOSIS — E7849 Other hyperlipidemia: Secondary | ICD-10-CM

## 2021-05-31 DIAGNOSIS — I10 Essential (primary) hypertension: Secondary | ICD-10-CM

## 2021-05-31 DIAGNOSIS — R7303 Prediabetes: Secondary | ICD-10-CM

## 2021-05-31 DIAGNOSIS — G8929 Other chronic pain: Secondary | ICD-10-CM | POA: Diagnosis not present

## 2021-05-31 DIAGNOSIS — R7989 Other specified abnormal findings of blood chemistry: Secondary | ICD-10-CM | POA: Diagnosis not present

## 2021-05-31 DIAGNOSIS — M9908 Segmental and somatic dysfunction of rib cage: Secondary | ICD-10-CM

## 2021-05-31 DIAGNOSIS — F3289 Other specified depressive episodes: Secondary | ICD-10-CM

## 2021-05-31 DIAGNOSIS — E559 Vitamin D deficiency, unspecified: Secondary | ICD-10-CM | POA: Diagnosis not present

## 2021-05-31 DIAGNOSIS — M9901 Segmental and somatic dysfunction of cervical region: Secondary | ICD-10-CM

## 2021-05-31 DIAGNOSIS — M9904 Segmental and somatic dysfunction of sacral region: Secondary | ICD-10-CM

## 2021-05-31 DIAGNOSIS — Z9189 Other specified personal risk factors, not elsewhere classified: Secondary | ICD-10-CM | POA: Diagnosis not present

## 2021-05-31 DIAGNOSIS — M5441 Lumbago with sciatica, right side: Secondary | ICD-10-CM | POA: Diagnosis not present

## 2021-05-31 DIAGNOSIS — Z6837 Body mass index (BMI) 37.0-37.9, adult: Secondary | ICD-10-CM | POA: Diagnosis not present

## 2021-05-31 DIAGNOSIS — M9903 Segmental and somatic dysfunction of lumbar region: Secondary | ICD-10-CM | POA: Diagnosis not present

## 2021-05-31 LAB — HEPATITIS B SURFACE ANTIBODY, QUANTITATIVE: Hep B S AB Quant (Post): 6 m[IU]/mL — ABNORMAL LOW (ref >=10)

## 2021-05-31 LAB — HEPATITIS B SURFACE ANTIGEN: Hepatitis B Surface Ag: NONREACTIVE

## 2021-05-31 LAB — HEPATITIS B CORE ANTIBODY, TOTAL: Hep B Core Total Ab: NONREACTIVE

## 2021-05-31 LAB — HEPATITIS C ANTIBODY
Hepatitis C Ab: NONREACTIVE
SIGNAL TO CUT-OFF: 0.18 (ref <1.00)

## 2021-05-31 LAB — HEPATITIS A ANTIBODY, TOTAL: Hepatitis A AB,Total: NONREACTIVE

## 2021-05-31 MED ORDER — IBUPROFEN 800 MG PO TABS
800.0000 mg | ORAL_TABLET | Freq: Three times a day (TID) | ORAL | 0 refills | Status: AC | PRN
  Filled 2021-05-31: qty 90, 30d supply, fill #0

## 2021-05-31 NOTE — Patient Instructions (Addendum)
Good to see you MRI lumbar and pelvis 800 mg ibuprofen See me again 6 weeks

## 2021-05-31 NOTE — Assessment & Plan Note (Signed)
Patient has had low back pain with intermittent radicular symptoms previously.  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  Discussed icing regimen and home exercises.  I do feel at this point that with patient's other underlying problems MRI would be beneficial to further evaluate to see if this is more bony abnormality versus possible reaction to the autoimmune disease.  Patient will increase activity slowly otherwise.  Discussed icing regimen and home exercises.  Attempted osteopathic manipulation which made some mild improvement but nothing significant and patient has not made significant breakthrough in quite some time.  Patient will follow-up after imaging and discuss further.

## 2021-05-31 NOTE — Patient Instructions (Signed)
The 10-year ASCVD risk score April [name] April Brooke Bonito., et al., 2013) is: 0.9%*   Values used to calculate the score:     Age: 45 years     Sex: Female     Is Non-Hispanic African American: No     Diabetic: No     Tobacco smoker: No     Systolic Blood Pressure: 216 mmHg     Is BP treated: Yes     HDL Cholesterol: 66 mg/dL*     Total Cholesterol: 225 mg/dL*     * - Cholesterol units were assumed for this score calculation

## 2021-06-04 NOTE — Progress Notes (Signed)
Office: [phone]  /  Fax: [phone]    Date: June 18, 2021   Appointment Start Time: 11:58am Duration: 41 minutes Provider: Glennie Isle, Psy.D. Type of Session: Intake for Individual Therapy  Location of Patient: Home Location of Provider: Provider's home (private office) Type of Contact: Telepsychological Visit via MyChart Video Visit  Informed Consent: Prior to proceeding with today's appointment, two pieces of identifying information were obtained. In addition, Temisha's physical location at the time of this appointment was obtained as well a phone number she could be reached at in the event of technical difficulties. Janiesha and this provider participated in today's telepsychological service.   The provider's role was explained to [employer]. The provider reviewed and discussed issues of confidentiality, privacy, and limits therein (e.g., reporting obligations). In addition to verbal informed consent, written informed consent for psychological services was obtained prior to the initial appointment. Since the clinic is not a 24/7 crisis center, mental health emergency resources were shared and this  provider explained MyChart, e-mail, voicemail, and/or other messaging systems should be utilized only for non-emergency reasons. This provider also explained that information obtained during appointments will be placed in Constanza's medical record and relevant information will be shared with other providers at [facility] for coordination of care. Lucky agreed information may be shared with other [facility] providers as needed for coordination of care and by signing the service agreement document, she provided written consent for coordination of care. Prior to initiating telepsychological services, Markesia completed an informed consent document, which included the development of a safety plan (i.e., an emergency contact and emergency resources) in the  event of an emergency/crisis. Nishtha verbally acknowledged understanding she is ultimately responsible for understanding her insurance benefits for telepsychological and in-person services. This provider also reviewed confidentiality, as it relates to telepsychological services, as well as the rationale for telepsychological services (i.e., to reduce exposure risk to COVID-19). Shavy  acknowledged understanding that appointments cannot be recorded without both party consent and she is aware she is responsible for securing confidentiality on her end of the session. Lenoria verbally consented to proceed.  Chief Complaint/HPI: Kristinia was referred by Dr. Mellody Dance due to other depression, with emotional eating on May 31, 2021. The note for the initial appointment with Dr. Mellody Dance  on May 17, 2021 indicated the following: "Her family eats meals together, she thinks her family will eat healthier with her, she struggles with family and or coworkers weight loss sabotage, her desired weight loss is 83 pounds, she has been heavy most of her life, she started gaining weight at around 45 years of age, her heaviest weight ever was her current weight, she craves sugar and carbs, she snacks frequently in the evenings, she frequently makes poor food choices, she has problems with excessive hunger, she frequently eats larger portions than normal, and she struggles with emotional eating." Delight's Food and Mood (modified PHQ-9) score on May 17, 2021 was 17.  During today's appointment, Fredrika was verbally administered a questionnaire assessing various behaviors related to emotional eating behaviors. Makaia endorsed the following: overeat when you are celebrating, experience food cravings on a regular basis, eat certain foods when you are anxious, stressed, depressed, or your feelings are hurt, use food to help you cope with emotional situations, find food is comforting to you, overeat frequently when you  are bored or lonely, overeat when you are alone, but eat much less when you are with other people, and  eat as a reward. Pranika believes the onset of emotional eating behaviors was likely in childhood, and described the current frequency of emotional eating behaviors as "daily." In addition, Kiersten endorsed a history of binge eating behaviors. She explained she engages in binge eating behaviors in the evenings after dinner, noting she will eat until a "craving" is "satisfied." For instance, she recalled last night she had a "large dinner" and she later consumed a bag of kettle corn popcorn and two tbsp. of organic almond nut butter and chocolate chips before she felt satisfied She described the frequency of binge eating behavior as 4-5xs a week, noting it used to be daily before starting with the clinic. Guerline denied a history of restricting food intake, purging and engagement in other compensatory strategies for weight loss, and has never been diagnosed with an eating disorder. She also denied a history of treatment for emotional eating. Currently, Makeisha indicated work stress triggers emotional eating behaviors, whereas being busy makes emotional eating behaviors better. Furthermore, Gisselle denied other problems of concern.    Mental Status Examination:  Appearance: well groomed and appropriate hygiene  Behavior: appropriate to circumstances Mood: euthymic Affect: mood congruent Speech: normal in rate, volume, and tone Eye Contact: appropriate Psychomotor Activity: unable to assess Gait: unable to assess  Thought Process: linear, logical, and goal directed  Thought Content/Perception: denies suicidal and homicidal ideation, plan, and intent, no hallucinations, delusions, bizarre thinking or behavior reported or observed, and denies ideation and engagement in self-injurious behaviors Orientation: time, person, place, and purpose of appointment Memory/Concentration: memory, attention, language,  and fund of knowledge intact  Insight/Judgment: good  Family & Psychosocial History: Parris reported she is married and she does not have any children. 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 Currently, Zakaiya's social support system consists of her husband. Moreover, Vernia stated she resides with her husband, four cats, and a dog.   Medical History:  Past Medical History:  Diagnosis Date   ALLERGIC RHINITIS    Anal fissure    Anxiety    ANXIETY DEPRESSION    Back pain    CONSTIPATION    Constipation    DEPRESSION    Esophageal reflux    Food allergy    Avacados   Gastritis    GERD (gastroesophageal reflux disease)    HYPERCHOLESTEROLEMIA    Hyperlipidemia    Hypertension    OBESITY    Occipital neuralgia    Palpitations    Rheumatoid arthritis ([place]) 12/24/2017   Past Surgical History:  Procedure Laterality Date   NO PAST SURGERIES     Current Outpatient Medications on File Prior to Visit  Medication Sig Dispense Refill   ascorbic acid (VITAMIN C) 1000 MG tablet Take 1,000 mg by mouth daily.     busPIRone (BUSPAR) 15 MG tablet Take 1 tablet (15 mg total) by mouth 2 (two) times daily. 180 tablet 1   celecoxib (CELEBREX) 200 MG capsule Take 1 capsule (200 mg total) by mouth 2 (two) times daily with food. 60 capsule 2   cetirizine (ZYRTEC) 10 MG tablet Take 1 tablet (10 mg total) by mouth daily. 30 tablet 3   Cholecalciferol (VITAMIN D3) 125 MCG (5000 UT) CAPS Take 1 capsule by mouth daily.     clindamycin (CLINDAGEL) 1 % gel APPLY TOPICALLY TO AFFECTED AREA ON THE SKIN TWICE DAILY. 60 g 1   clobetasol  ointment (TEMOVATE) 0.05 % APPLY TO AFFECTED AREA TWICE DAILY FOR 10 DAYS  0   escitalopram (LEXAPRO) 20 MG tablet Take 1 tablet (20 mg total) by mouth daily. 90 tablet 0   esomeprazole (NEXIUM) 20 MG capsule Take 20 mg by mouth daily at 12 noon.      esomeprazole (NEXIUM) 40 MG capsule Take 1 capsule (40 mg total) by mouth daily. 90 capsule 3   etodolac (LODINE) 400 MG tablet Take 1 tablet (400 mg total) by mouth 2 (two) times daily as needed with food 60 tablet 5   etonogestrel-ethinyl estradiol (NUVARING) 0.12-0.015 MG/24HR vaginal ring REMOVE AND INSERT A NEW RING INTO THE VAGINA EVERY 4 WEEKS *USE CONTINUOUSLY* (Patient taking differently: REMOVE AND INSERT A NEW RING INTO THE VAGINA EVERY 4 WEEKS *USE CONTINUOUSLY*) 3 each 4   fenofibrate 160 MG tablet Take 1 tablet (160 mg total) by mouth daily. 90 tablet 3   folic acid (FOLVITE) 1 MG tablet TAKE 1 TABLET BY MOUTH DAILY 90 tablet 6   gabapentin (NEURONTIN) 300 MG capsule Take 1 capsule by mouth once daily. 90 capsule 1   ibuprofen (ADVIL) 800 MG tablet Take 1 tablet (800 mg total) by mouth every 8 (eight) hours as needed. 90 tablet 0   Ibuprofen-Famotidine (DUEXIS) 800-26.6 MG TABS Take 800 mg by mouth 3 (three) times daily. 90 tablet 3   losartan (COZAAR) 50 MG tablet Take 1 tablet (50 mg total) by mouth daily. 90 tablet 1   MAGNESIUM MALATE PO Take 1 tablet by mouth at bedtime.     montelukast (SINGULAIR) 10 MG tablet TAKE 1 TABLET (10 MG TOTAL) BY MOUTH AT BEDTIME. 90 tablet 3   Multiple Vitamin (MULTIVITAMIN) tablet Take 1 tablet by mouth daily.     Omega-3 Fatty Acids (FISH OIL) 1000 MG CAPS Take 2 capsules by mouth 2 (two) times daily.      Probiotic Product (SOLUBLE FIBER/PROBIOTICS PO) Take 1 capsule by mouth daily.      Upadacitinib ER (RINVOQ) 15 MG TB24 Take 1 tablet by mouth daily. 30 tablet 5   zolpidem (AMBIEN) 5 MG tablet Take 1 tablet (5 mg total) by mouth at bedtime as needed for sleep. 90 tablet 1   [DISCONTINUED] Certolizumab Pegol 2 X 200 MG/ML PSKT inject 2 injections subcutaneously once monthly 2 each 4   No current facility-administered medications on file prior to visit.  No issues with current medications; medication compliant.   Mental Health History:  Lolly reported she last attended therapeutic services in the last five years to address symptoms of depression with Dr. Cheryln Manly ([place]). She stated she previously met with other clinicians (~3 times) over the last 20 years to address symptoms of depression. She stated she is currently prescribed Buspar PRN, Ambien PRN, and Lexapro by her PCP; she denied any concerns. She shared she previously met with Dr. Casimiro Needle (around same time she was meeting with Dr. Cheryln Manly) for medication management. Sanaii reported there is no history of hospitalizations for psychiatric concerns. Amera denied a family history of mental health/substance abuse related concerns. Fabiola reported there is no history of trauma including psychological, physical , and sexual abuse, as well as neglect.  Syerra experienced "major depression" for the first time in 2000 while in college secondary to a relationship ending. She sought therapeutic and psychiatric services to address symptoms of depression. Shivaun stated she experienced suicidal ideation during that time ("I just wanted to die. I didn't think I was worth it.")  She denied experiencing suicidal intent and plan. 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 She disclosed experiencing suicidal ideation during that time. She denied experiencing suicidal plan and intent. Jannett stated that was the last time she experienced suicidal ideation and denied a history of suicide attempts. The following protective factors were identified for Joycelyn: family and husband. If she were to become overwhelmed or severely depressed in the future, which are signs that a crisis may occur, she identified the following coping skills she could engage in: turn to husband for emotional support; take Buspar regularly; pray; connect with church  family; and read (faith based depression self-help books). It was recommended the aforementioned be written down and developed into a coping card for future reference; she agreed and stated she made a note for herself to develop a coping card. Psychoeducation regarding the importance of reaching out to a trusted individual and/or utilizing emergency resources if there is a change in emotional status and/or there is an inability to ensure safety was provided. Ruthanna's confidence in reaching out to a trusted individual and/or utilizing emergency resources should there be an intensification in emotional status and/or there is an inability to ensure safety was assessed on a scale of one to ten where one is not confident and ten is extremely confident. She reported her confidence is a 10. Additionally, Delvina shared she and her husband have firearms that are locked in a safe. She was receptive to relocating firearms should there ever be concern regarding her safety.   Gaytha described her typical mood lately as "stressed and anxious, but not depressed at this time." Aside from concerns noted above and endorsed on the PHQ-9 and GAD-7, Severa reported feeling "stuck in a rut," adding she prefers to stay home and "not socialize much" for "several years." She feels the aforementioned is secondary to RA related fatigue. Ishana denied current alcohol use. She denied tobacco use. She denied illicit/recreational substance use. Regarding caffeine intake, Seara reported consuming 12 oz of coffee daily and sometimes a diet soda. Furthermore, Kitty indicated she is not experiencing the following: hallucinations and delusions, paranoia, symptoms of mania , social withdrawal, crying spells, panic attacks, and obsessions and compulsions. She also denied current suicidal ideation, plan, and intent; history of and current homicidal ideation, plan, and intent; and history of and current engagement in self-harm.   The  following strengths were reported by Yilia: timeliness, reliability, dependability, and loyalty. The following strengths were observed by this provider: ability to express thoughts and feelings during the therapeutic session, ability to establish and benefit from a therapeutic relationship, willingness to work toward established goal(s) with the clinic and ability to engage in reciprocal conversation.   Legal History: Bryanda reported there is no history of legal involvement.   Structured Assessments Results: The Patient Health Questionnaire-9 (PHQ-9) is a self-report measure that assesses symptoms and severity of depression over the course of the last two weeks. Nioma obtained a score of 4 suggesting minimal depression. Zara finds the endorsed symptoms to be somewhat difficult. [0= Not at all; 1= Several days; 2= More than half the days; 3= Nearly every day] Little interest or pleasure in doing things 0  Feeling down, depressed, or hopeless 0  Trouble falling or staying asleep, or sleeping too much 0  Feeling tired or having little energy- attributes it to RA 3  Poor appetite or overeating 1  Feeling bad about yourself --- or that you are a failure or have let yourself or your family down 0  Trouble concentrating on things, such as reading the newspaper or watching television 0  Moving or speaking so slowly that other people could have noticed? Or the opposite --- being so fidgety or restless that you have been moving around a lot more than usual 0  Thoughts that you would be better off dead or hurting yourself in some way 0  PHQ-9 Score 4    The Generalized Anxiety Disorder-7 (GAD-7) is a brief self-report measure that assesses symptoms of anxiety over the course of the last two weeks. Shawnee obtained a score of 3 suggesting minimal anxiety. Naveh finds the endorsed symptoms to be somewhat difficult. [0= Not at all; 1= Several days; 2= Over half the days; 3= Nearly every day] Feeling  nervous, anxious, on edge- related to work 1  Not being able to stop or control worrying 0  Worrying too much about different things 0  Trouble relaxing 0  Being so restless that it's hard to sit still 0  Becoming easily annoyed or irritable 2  Feeling afraid as if something awful might happen 0  GAD-7 Score 3   Interventions:  Conducted a chart review Focused on rapport building Verbally administered PHQ-9 and GAD-7 for symptom monitoring Verbally administered Food & Mood questionnaire to assess various behaviors related to emotional eating Provided emphatic reflections and validation Collaborated with patient on a treatment goal  Psychoeducation provided regarding physical versus emotional hunger Conducted a risk assessment Developed a coping card  Provisional DSM-5 Diagnosis(es): F32.89 Other Specified Depressive Disorder, Emotional Eating and Binge Eating Behaviors  Plan: Rue appears able and willing to participate as evidenced by collaboration on a treatment goal, engagement in reciprocal conversation, and asking questions as needed for clarification. Due to Nicollette's work schedule and her requiring a late afternoon appointment, the next appointment will be scheduled in 2-3 weeks, which will be via [facility] Visit. The following treatment goal was established: increase coping skills. This provider will regularly review the treatment plan and medical chart to keep informed of status changes. Kymberlie expressed understanding and agreement with the initial treatment plan of care. Ecko will be sent a handout via e-mail to utilize between now and the next appointment to increase awareness of hunger patterns and subsequent eating. Valley provided verbal consent during today's appointment for this provider to send the handout via e-mail.

## 2021-06-08 ENCOUNTER — Other Ambulatory Visit (HOSPITAL_COMMUNITY): Payer: Self-pay

## 2021-06-08 ENCOUNTER — Other Ambulatory Visit: Payer: Self-pay | Admitting: Internal Medicine

## 2021-06-08 DIAGNOSIS — I10 Essential (primary) hypertension: Secondary | ICD-10-CM

## 2021-06-08 MED ORDER — LOSARTAN POTASSIUM 50 MG PO TABS
50.0000 mg | ORAL_TABLET | Freq: Every day | ORAL | 1 refills | Status: DC
  Filled 2021-06-08: qty 90, 90d supply, fill #0
  Filled 2021-09-06: qty 90, 90d supply, fill #1

## 2021-06-11 ENCOUNTER — Other Ambulatory Visit (HOSPITAL_COMMUNITY): Payer: Self-pay

## 2021-06-11 ENCOUNTER — Other Ambulatory Visit: Payer: Self-pay | Admitting: Internal Medicine

## 2021-06-11 MED ORDER — FENOFIBRATE 160 MG PO TABS
160.0000 mg | ORAL_TABLET | Freq: Every day | ORAL | 3 refills | Status: DC
  Filled 2021-06-11: qty 90, 90d supply, fill #0
  Filled 2021-09-06: qty 90, 90d supply, fill #1
  Filled 2021-12-21: qty 90, 90d supply, fill #2
  Filled 2022-03-25: qty 90, 90d supply, fill #3

## 2021-06-11 MED ORDER — ESOMEPRAZOLE MAGNESIUM 40 MG PO CPDR
40.0000 mg | DELAYED_RELEASE_CAPSULE | Freq: Every day | ORAL | 3 refills | Status: DC
  Filled 2021-06-11: qty 90, 90d supply, fill #0
  Filled 2021-09-06: qty 90, 90d supply, fill #1
  Filled 2021-12-05: qty 90, 90d supply, fill #2
  Filled 2022-03-03: qty 90, 90d supply, fill #3

## 2021-06-12 ENCOUNTER — Other Ambulatory Visit (HOSPITAL_COMMUNITY): Payer: Self-pay

## 2021-06-13 NOTE — Progress Notes (Signed)
Chief Complaint:   OBESITY April [name] is here to discuss her progress with her obesity treatment plan along with follow-up of her obesity related diagnoses.   Today's visit was #: 2 Starting weight: 263 lbs Starting date: 05/17/2021 Today's weight: 259 lbs Today's date: 05/31/2021 Weight change since last visit: 4 lbs Total lbs lost to date: 4 lbs Body mass index is 37.16 kg/m.  Total weight loss percentage to date: -1.52%  Interim History: April [name] is here today for her first follow-up office visit since starting the program with [place].  All blood work/ lab tests that were recently ordered by myself or an outside provider were reviewed with patient today per their request.   Extended time was spent counseling her on all new disease processes that were discovered or preexisting ones that are worsening.  she understands that many of these abnormalities will need to monitored regularly along with the current treatment plan of prudent dietary changes, in which we are making each and every office visit, to improve these health parameters.  April [name] felt that she was starving for the whole day and it only improved minimally as she progressed with meal plan.  She is bored with a sandwich everyday.  Plan:  Gave lunch options handout.  Current Meal Plan: the Category 2 Plan for 75% of the time.  Current Exercise Plan: None.  Assessment/Plan:   1. Essential hypertension At goal. Medications: Cozaar 50 mg daily.  No vision changes, headache or chest pain.  Plan: Discussed labs with patient today.  Avoid buying foods that are: processed, frozen, or prepackaged to avoid excess salt. We will continue to monitor closely alongside her PCP and/or Specialist.  Regular follow up with PCP and specialists was also encouraged.   BP Readings from Last 3 Encounters:  05/31/21 123/77  05/31/21 136/80  05/30/21 132/84   Lab Results  Component Value Date   CREATININE 0.77 05/17/2021   2.  Other hyperlipidemia Course: Not at goal. Lipid-lowering medications: fenofibrate 160 mg daily, fish oil.   Plan:  Worsening.  Discussed labs with patient today.  Slightly worse than prior.  Continue medications for now and focus on prudent nutritional plan with decrease saturated and trans fats.  Recheck in 4-6 months.  Dietary changes: Increase soluble fiber, decrease simple carbohydrates, decrease saturated fat. Exercise changes: Moderate to vigorous-intensity aerobic activity 150 minutes per week or as tolerated. We will continue to monitor along with PCP/specialists as it pertains to her weight loss journey.  Lab Results  Component Value Date   CHOL 225 (A) 05/11/2021   HDL 66 05/11/2021   LDLCALC 128 05/11/2021   LDLDIRECT 96.0 05/26/2019   TRIG 179 (A) 05/11/2021   CHOLHDL 3.1 05/26/2020   Lab Results  Component Value Date   ALT 33 (H) 05/17/2021   AST 27 05/17/2021   ALKPHOS 96 05/17/2021   BILITOT 0.3 05/17/2021   The 10-year ASCVD risk score Mikey Bussing DC Jr., et al., 2013) is: 0.9%*   Values used to calculate the score:     Age: 45 years     Sex: Female     Is Non-Hispanic African American: No     Diabetic: No     Tobacco smoker: No     Systolic Blood Pressure: [id number]     Is BP treated: Yes     HDL Cholesterol: 66 mg/dL*     Total Cholesterol: 225 mg/dL*     * - Cholesterol units were assumed for  this score calculation  3. Prediabetes At goal. Goal is HgbA1c < 5.7.  Medication: None.    Plan:  New.  Discussed labs with patient today.  Handouts on prediabetes and metformin given to patient after extensive counseling.  Consider medication in the future.  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  She will continue to focus on protein-rich, low simple carbohydrate foods. We reviewed the importance of hydration, regular exercise for stress reduction, and restorative sleep.   Lab Results  Component Value Date   HGBA1C 5.7 (H) 05/17/2021   Lab Results  Component Value  Date   INSULIN 16.3 05/17/2021   4. Vitamin D deficiency At goal.  She takes OTC vitamin D 5,000 IU daily.  Plan:  Discussed labs with patient today.  Continue current OTC vitamin D supplementation.  Follow-up for routine testing of Vitamin D, at least 2-3 times per year to avoid over-replacement.  Lab Results  Component Value Date   VD25OH 75.2 05/17/2021   VD25OH 59 05/26/2020   VD25OH 41.41 05/06/2017   5. Elevated LFTs Slightly above normal.  No ETOH or Tylenol use.  Low risk for hepatitis.  Plan:  New.  Discussed labs with patient today.  She followed up with her PCP, who ordered blood work and abdominal US.  I advised her this is likely NAFLD and she should follow prudent nutritional plan and lose weight.  Recheck in 3-4 months.  Elevated liver transaminases with an ALT predominance combined with obesity and insulin resistance is characteristic, but not diagnostic of non-alcoholic fatty liver disease (NAFLD). NAFLD is the 2nd leading cause of liver transplant in adults. Treatment includes weight loss, elimination of sweet drinks, including juice, avoidance of high fructose corn syrup, and exercise. As always, avoiding alcohol consumption is important.  Lab Results  Component Value Date   ALT 33 (H) 05/17/2021   AST 27 05/17/2021   ALKPHOS 96 05/17/2021   BILITOT 0.3 05/17/2021   6. Other depression with emotional eating Stable.  Medication: Lexapro 20 mg daily.  Referred to Dr. Mallie Mussel.  Has not seen her yet.  Denies cravings today or increase in emotional eating habits.  Plan:  Discussed labs with patient today.  Mood stable.  Follow-up with Dr. Mallie Mussel.  Will monitor.  7. At risk for diabetes mellitus - April [name] was given diabetes prevention education and counseling today of more than 23 minutes.  - Counseled patient on pathophysiology of disease and meaning/ implication of lab results.  - Reviewed how certain foods can either stimulate or inhibit insulin release, and  subsequently affect hunger pathways  - Importance of following a healthy meal plan with limiting amounts of simple carbohydrates discussed with patient - Effects of regular aerobic exercise on blood sugar regulation reviewed and encouraged an eventual goal of 30 min 5d/week or more as a minimum.  - Briefly discussed treatment options, which always include dietary and lifestyle modification as first line.   - Handouts provided at patient's desire and/or told to go online to the American Diabetes Association website for further information.  8. Class 2 severe obesity with serious comorbidity and body mass index (BMI) of 37.0 to 37.9 in adult, unspecified obesity type (April [name])  Course: April [name] is currently in the action stage of change. As such, her goal is to continue with weight loss efforts.   Nutrition goals: She has agreed to the Category 3 Plan with lunch options.   Exercise goals:  As is.  Behavioral modification strategies: increasing lean  protein intake, decreasing simple carbohydrates, increasing water intake, keeping healthy foods in the home, and planning for success.  April [name] has agreed to follow-up with our clinic in 2 weeks. She was informed of the importance of frequent follow-up visits to maximize her success with intensive lifestyle modifications for her multiple health conditions.   Objective:   Blood pressure 123/77, pulse 89, temperature 98.4 F (36.9 C), height '5\' 10"'$  (1.778 m), weight 259 lb (117.5 kg), SpO2 98 %. Body mass index is 37.16 kg/m.  General: Cooperative, alert, well developed, in no acute distress. HEENT: Conjunctivae and lids unremarkable. Cardiovascular: Regular rhythm.  Lungs: Normal work of breathing. Neurologic: No focal deficits.   Lab Results  Component Value Date   CREATININE 0.77 05/17/2021   BUN 11 05/17/2021   NA 140 05/17/2021   K 4.8 05/17/2021   CL 103 05/17/2021   CO2 23 05/17/2021   Lab Results  Component Value Date   ALT 33 (H)  05/17/2021   AST 27 05/17/2021   ALKPHOS 96 05/17/2021   BILITOT 0.3 05/17/2021   Lab Results  Component Value Date   HGBA1C 5.7 (H) 05/17/2021   HGBA1C 5.4 05/26/2020   HGBA1C 5.5 05/26/2019   HGBA1C 5.6 05/22/2018   HGBA1C 5.8 05/06/2017   Lab Results  Component Value Date   INSULIN 16.3 05/17/2021   Lab Results  Component Value Date   TSH 0.955 05/17/2021   Lab Results  Component Value Date   CHOL 225 (A) 05/11/2021   HDL 66 05/11/2021   LDLCALC 128 05/11/2021   LDLDIRECT 96.0 05/26/2019   TRIG 179 (A) 05/11/2021   CHOLHDL 3.1 05/26/2020   Lab Results  Component Value Date   VD25OH 75.2 05/17/2021   VD25OH 59 05/26/2020   VD25OH 41.41 05/06/2017   Lab Results  Component Value Date   WBC 5.4 05/17/2021   HGB 12.6 05/17/2021   HCT 39.9 05/17/2021   MCV 92 05/17/2021   PLT 418 (A) 05/11/2021   Lab Results  Component Value Date   IRON 82 05/30/2021   FERRITIN 290.9 05/30/2021   Attestation Statements:   Reviewed by clinician on day of visit: allergies, medications, problem list, medical history, surgical history, family history, social history, and previous encounter notes.  I, Water quality scientist, CMA, am acting as Location manager for [employer], DO.  I have reviewed the above documentation for accuracy and completeness, and I agree with the above. Marjory Sneddon, D.O.  The [place] was signed into law in 2016 which includes the topic of electronic health records.  This provides immediate access to information in MyChart.  This includes consultation notes, operative notes, office notes, lab results and pathology reports.  If you have any questions about what you read please let us know at your next visit so we can discuss your concerns and take corrective action if need be.  We are right here with you.

## 2021-06-15 ENCOUNTER — Other Ambulatory Visit (HOSPITAL_COMMUNITY): Payer: Self-pay

## 2021-06-15 ENCOUNTER — Other Ambulatory Visit: Payer: 59

## 2021-06-16 ENCOUNTER — Other Ambulatory Visit: Payer: Self-pay

## 2021-06-16 ENCOUNTER — Ambulatory Visit
Admission: RE | Admit: 2021-06-16 | Discharge: 2021-06-16 | Disposition: A | Discharge status: Home / Self Care | Payer: 59 | Source: Ambulatory Visit | Attending: Family Medicine | Admitting: Family Medicine

## 2021-06-16 DIAGNOSIS — M545 Low back pain, unspecified: Secondary | ICD-10-CM

## 2021-06-16 DIAGNOSIS — M7062 Trochanteric bursitis, left hip: Secondary | ICD-10-CM | POA: Diagnosis not present

## 2021-06-16 DIAGNOSIS — M7061 Trochanteric bursitis, right hip: Secondary | ICD-10-CM | POA: Diagnosis not present

## 2021-06-16 DIAGNOSIS — M47817 Spondylosis without myelopathy or radiculopathy, lumbosacral region: Secondary | ICD-10-CM | POA: Diagnosis not present

## 2021-06-16 DIAGNOSIS — M5127 Other intervertebral disc displacement, lumbosacral region: Secondary | ICD-10-CM | POA: Diagnosis not present

## 2021-06-16 DIAGNOSIS — M5137 Other intervertebral disc degeneration, lumbosacral region: Secondary | ICD-10-CM | POA: Diagnosis not present

## 2021-06-16 DIAGNOSIS — G8929 Other chronic pain: Secondary | ICD-10-CM

## 2021-06-18 ENCOUNTER — Telehealth (INDEPENDENT_AMBULATORY_CARE_PROVIDER_SITE_OTHER): Payer: 59 | Admitting: Psychology

## 2021-06-18 DIAGNOSIS — F3289 Other specified depressive episodes: Secondary | ICD-10-CM

## 2021-06-20 ENCOUNTER — Other Ambulatory Visit: Payer: Self-pay

## 2021-06-20 ENCOUNTER — Ambulatory Visit (INDEPENDENT_AMBULATORY_CARE_PROVIDER_SITE_OTHER): Payer: 59 | Admitting: Family Medicine

## 2021-06-20 ENCOUNTER — Encounter (INDEPENDENT_AMBULATORY_CARE_PROVIDER_SITE_OTHER): Payer: Self-pay | Admitting: Family Medicine

## 2021-06-20 VITALS — BP 136/84 | HR 83 | Temp 98.2°F | Ht 70.0 in | Wt 256.0 lb

## 2021-06-20 DIAGNOSIS — I10 Essential (primary) hypertension: Secondary | ICD-10-CM | POA: Diagnosis not present

## 2021-06-20 DIAGNOSIS — Z6837 Body mass index (BMI) 37.0-37.9, adult: Secondary | ICD-10-CM

## 2021-06-20 DIAGNOSIS — R7303 Prediabetes: Secondary | ICD-10-CM | POA: Diagnosis not present

## 2021-06-20 DIAGNOSIS — Z9189 Other specified personal risk factors, not elsewhere classified: Secondary | ICD-10-CM | POA: Diagnosis not present

## 2021-06-21 NOTE — Progress Notes (Signed)
Chief Complaint:   OBESITY April [name] is here to discuss her progress with her obesity treatment plan along with follow-up of her obesity related diagnoses. April [name] is on the Category 3 Plan with lunch options and states she is following her eating plan approximately 33% of the time. April [name] states she has not been exercising.  Today's visit was #: 3 Starting weight: 263 lbs Starting date: 05/17/2021 Today's weight: 256 lbs Today's date: 06/20/2021 Total lbs lost to date: 7 Total lbs lost since last in-office visit: 3  Interim History: April [name] has cut out her candy, cakes, cookies, and is eating healthier snacks. She changed from Category 2 to 3 at the last visit, which has helped her hunger during the day. April [name] is not measuring meats/proteins at all.  Subjective:   1. Prediabetes April [name] has a diagnosis of prediabetes based on her elevated HgA1c and she is not one medications. She was informed this puts her at greater risk of developing diabetes. She continues to work on diet and exercise to decrease her risk of diabetes. She denies nausea or hypoglycemia.  Lab Results  Component Value Date   HGBA1C 5.7 (H) 05/17/2021   Lab Results  Component Value Date   INSULIN 16.3 05/17/2021   2. Essential hypertension April [name] has hypertension, which is at goal.  BP Readings from Last 3 Encounters:  06/20/21 136/84  05/31/21 123/77  05/31/21 136/80   Lab Results  Component Value Date   CREATININE 0.77 05/17/2021   CREATININE 0.87 05/26/2020   CREATININE 0.76 12/15/2018   3. At risk for dehydration April [name] is at risk for dehydration.  Assessment/Plan:   1. Prediabetes Lekita agrees to increase protein and decrease simple carbs. I discussed with her that we can consider metformin or other medicines to help with carb cravings. Good blood sugar control is important to decrease the likelihood of diabetic complications such as nephropathy, neuropathy, limb loss, blindness,  coronary artery disease, and death. Intensive lifestyle modification including diet, exercise and weight loss are the first line of treatment for diabetes.   2. Essential hypertension April [name] agrees to continue her medications as written. She is working on healthy weight loss and exercise to improve blood pressure control. We will watch for signs of hypotension as she continues her lifestyle modifications.  3. At risk for dehydration April [name] is at higher than average risk of dehydration. April [name] was given proper hydration counseling today.  We discussed the signs and symptoms of dehydration, some of which may include muscle cramping, constipation or even orthostatic symptoms.  Counseling on the prevention of dehydration was also provided today.  April [name] is at risk for dehydration due to weight loss, lifestyle and behavorial habits and possibly due to taking certain medication(s).  She was encouraged to adequately hydrate and monitor fluid status to avoid dehydration as well as weight loss plateaus.  Unless pre-existing renal or cardiopulmonary conditions exist, in which patient was told to limit their fluid intake, I recommended roughly one half of their weight in pounds to be the approximate ounces of non-caloric, non-caffeinated beverages they should drink per day; including more if they are engaging in exercise.   4. Obesity with current BMI of 36.8 April [name]'s goal for her next office visit is to weigh her proteins and eat them all. She also agrees to increase water to 100 ounces.  April [name] is currently in the action stage of change. As such, her goal is to continue with weight loss efforts. She has agreed to the Category  3 Plan with lunch options.   Exercise goals:  As is.  Behavioral modification strategies: increasing lean protein intake, decreasing simple carbohydrates, and increasing water intake.  April [name] has agreed to follow-up with our clinic in 3 weeks. She was informed of the importance  of frequent follow-up visits to maximize her success with intensive lifestyle modifications for her multiple health conditions.   Objective:   Blood pressure 136/84, pulse 83, temperature 98.2 F (36.8 C), height '5\' 10"'$  (1.778 m), weight 256 lb (116.1 kg), SpO2 97 %. Body mass index is 36.73 kg/m.  General: Cooperative, alert, well developed, in no acute distress. HEENT: Conjunctivae and lids unremarkable. Cardiovascular: Regular rhythm.  Lungs: Normal work of breathing. Neurologic: No focal deficits.   Lab Results  Component Value Date   CREATININE 0.77 05/17/2021   BUN 11 05/17/2021   NA 140 05/17/2021   K 4.8 05/17/2021   CL 103 05/17/2021   CO2 23 05/17/2021   Lab Results  Component Value Date   ALT 33 (H) 05/17/2021   AST 27 05/17/2021   ALKPHOS 96 05/17/2021   BILITOT 0.3 05/17/2021   Lab Results  Component Value Date   HGBA1C 5.7 (H) 05/17/2021   HGBA1C 5.4 05/26/2020   HGBA1C 5.5 05/26/2019   HGBA1C 5.6 05/22/2018   HGBA1C 5.8 05/06/2017   Lab Results  Component Value Date   INSULIN 16.3 05/17/2021   Lab Results  Component Value Date   TSH 0.955 05/17/2021   Lab Results  Component Value Date   CHOL 225 (A) 05/11/2021   HDL 66 05/11/2021   LDLCALC 128 05/11/2021   LDLDIRECT 96.0 05/26/2019   TRIG 179 (A) 05/11/2021   CHOLHDL 3.1 05/26/2020   Lab Results  Component Value Date   VD25OH 75.2 05/17/2021   VD25OH 59 05/26/2020   VD25OH 41.41 05/06/2017   Lab Results  Component Value Date   WBC 5.4 05/17/2021   HGB 12.6 05/17/2021   HCT 39.9 05/17/2021   MCV 92 05/17/2021   PLT 418 (A) 05/11/2021   Lab Results  Component Value Date   IRON 82 05/30/2021   FERRITIN 290.9 05/30/2021   Attestation Statements:   Reviewed by clinician on day of visit: allergies, medications, problem list, medical history, surgical history, family history, social history, and previous encounter notes.  IMarcille Blanco, CMA, am acting as transcriptionist for  [employer], DO  I have reviewed the above documentation for accuracy and completeness, and I agree with the above. Marjory Sneddon, D.O.  The [place] was signed into law in 2016 which includes the topic of electronic health records.  This provides immediate access to information in MyChart.  This includes consultation notes, operative notes, office notes, lab results and pathology reports.  If you have any questions about what you read please let us know at your next visit so we can discuss your concerns and take corrective action if need be.  We are right here with you.

## 2021-06-26 NOTE — Progress Notes (Signed)
  Office: [phone]  /  Fax: [phone]    Date: July 10, 2021   Appointment Start Time: 3:01pm Duration: 30 minutes Provider: Glennie Isle, Psy.D. Type of Session: Individual Therapy  Location of Patient: Home (private location) Location of Provider: Provider's Home (private office) Type of Contact: Telepsychological Visit via MyChart Video Visit  Session Content: April [name] is a 45 y.o. female presenting for a follow-up appointment to address the previously established treatment goal of increasing coping skills. Today's appointment was a telepsychological visit due to COVID-19. April [name] provided verbal consent for today's telepsychological appointment and she is aware she is responsible for securing confidentiality on her end of the session. Prior to proceeding with today's appointment, April [name]'s physical location at the time of this appointment was obtained as well a phone number she could be reached at in the event of technical difficulties. April [name] and this provider participated in today's telepsychological service.   This provider conducted a brief check-in. April [name] shared, "It's been a really rough one week." She stated her dog passed away and she also is experiencing pain due to RA, work stress, and eating deviations. April [name] described experiencing all or nothing thinking. Thus, psychoeducation regarding the unhelpful thinking pattern was provided. April [name] of session focused on self-compassion due to April [name] acknowledging experiencing negative self-talk. She was engaged in an exercise to increase self-compassion to assist with her eating habits. She was challenged to regularly ask herself, "What do I need right now?" She noted a plan to focus on self-care by sitting outside with a book after today's appointment. Overall, April [name] was receptive to today's appointment as evidenced by openness to sharing, responsiveness to feedback, and  willingness to focus on increasing self-compassion  .  Mental Status Examination:  Appearance: well groomed and appropriate hygiene  Behavior: appropriate to circumstances Mood: euthymic Affect: mood congruent Speech: normal in rate, volume, and tone Eye Contact: appropriate Psychomotor Activity: appropriate Gait: unable to assess Thought Process: linear, logical, and goal directed  Thought Content/Perception: no hallucinations, delusions, bizarre thinking or behavior reported or observed and no evidence or endorsement of suicidal and homicidal ideation, plan, and intent Orientation: time, person, place, and purpose of appointment Memory/Concentration: memory, attention, language, and fund of knowledge intact  Insight/Judgment: fair  Interventions:  Conducted a brief chart review Provided empathic reflections and validation Employed supportive psychotherapy interventions to facilitate reduced distress and to improve coping skills with identified stressors Psychoeducation provided regarding all-or-nothing thinking Psychoeducation provided regarding self-compassion  DSM-5 Diagnosis(es): F32.89 Other Specified Depressive Disorder, Emotional Eating and Binge Eating Behaviors  Treatment Goal & Progress: During the initial appointment with this provider, the following treatment goal was established: increase coping skills. Progress is limited, as April [name] has just begun treatment with this provider; however, she is receptive to the interaction and interventions and rapport is being established.   Plan: The next appointment will be scheduled in approximately three weeks, which will be via MyChart Video Visit. The next session will focus on working towards the established treatment goal.

## 2021-06-28 ENCOUNTER — Other Ambulatory Visit: Payer: Self-pay

## 2021-06-28 ENCOUNTER — Ambulatory Visit
Admission: RE | Admit: 2021-06-28 | Discharge: 2021-06-28 | Disposition: A | Discharge status: Home / Self Care | Payer: 59 | Source: Ambulatory Visit | Attending: Internal Medicine | Admitting: Internal Medicine

## 2021-06-28 DIAGNOSIS — R7989 Other specified abnormal findings of blood chemistry: Secondary | ICD-10-CM | POA: Diagnosis not present

## 2021-07-03 ENCOUNTER — Other Ambulatory Visit (HOSPITAL_COMMUNITY): Payer: Self-pay

## 2021-07-04 ENCOUNTER — Other Ambulatory Visit (HOSPITAL_COMMUNITY): Payer: Self-pay

## 2021-07-09 ENCOUNTER — Other Ambulatory Visit (HOSPITAL_COMMUNITY): Payer: Self-pay

## 2021-07-10 ENCOUNTER — Telehealth (INDEPENDENT_AMBULATORY_CARE_PROVIDER_SITE_OTHER): Payer: 59 | Admitting: Psychology

## 2021-07-10 DIAGNOSIS — F3289 Other specified depressive episodes: Secondary | ICD-10-CM

## 2021-07-11 ENCOUNTER — Encounter (INDEPENDENT_AMBULATORY_CARE_PROVIDER_SITE_OTHER): Payer: Self-pay | Admitting: Family Medicine

## 2021-07-11 ENCOUNTER — Other Ambulatory Visit: Payer: Self-pay

## 2021-07-11 ENCOUNTER — Ambulatory Visit (INDEPENDENT_AMBULATORY_CARE_PROVIDER_SITE_OTHER): Payer: 59 | Admitting: Family Medicine

## 2021-07-11 VITALS — BP 142/90 | HR 76 | Temp 98.1°F | Ht 70.0 in | Wt 257.0 lb

## 2021-07-11 DIAGNOSIS — F39 Unspecified mood [affective] disorder: Secondary | ICD-10-CM | POA: Diagnosis not present

## 2021-07-11 DIAGNOSIS — Z6837 Body mass index (BMI) 37.0-37.9, adult: Secondary | ICD-10-CM | POA: Diagnosis not present

## 2021-07-12 ENCOUNTER — Other Ambulatory Visit: Payer: Self-pay

## 2021-07-12 ENCOUNTER — Ambulatory Visit: Payer: 59 | Admitting: Family Medicine

## 2021-07-12 VITALS — BP 120/82 | HR 75 | Ht 70.0 in | Wt 261.0 lb

## 2021-07-12 DIAGNOSIS — M9903 Segmental and somatic dysfunction of lumbar region: Secondary | ICD-10-CM

## 2021-07-12 DIAGNOSIS — M069 Rheumatoid arthritis, unspecified: Secondary | ICD-10-CM

## 2021-07-12 DIAGNOSIS — M9901 Segmental and somatic dysfunction of cervical region: Secondary | ICD-10-CM

## 2021-07-12 DIAGNOSIS — M9908 Segmental and somatic dysfunction of rib cage: Secondary | ICD-10-CM | POA: Diagnosis not present

## 2021-07-12 DIAGNOSIS — M545 Low back pain, unspecified: Secondary | ICD-10-CM | POA: Diagnosis not present

## 2021-07-12 DIAGNOSIS — M9904 Segmental and somatic dysfunction of sacral region: Secondary | ICD-10-CM | POA: Diagnosis not present

## 2021-07-12 DIAGNOSIS — M9902 Segmental and somatic dysfunction of thoracic region: Secondary | ICD-10-CM | POA: Diagnosis not present

## 2021-07-12 NOTE — Progress Notes (Signed)
[place] Phone: [phone] Subjective:   April [name], am serving as a scribe for Dr. Hulan Saas.  This visit occurred during the SARS-CoV-2 public health emergency.  Safety protocols were in place, including screening questions prior to the visit, additional usage of staff PPE, and extensive cleaning of exam room while observing appropriate contact time as indicated for disinfecting solutions.   I'm seeing this patient by the request  of:  Janith Lima, MD  CC: low back pain follow up   [id number]  April [name] is a 45 y.o. female coming in with complaint of back and neck pain. OMT 05/31/2021. Patient states that her SI joint pain has increased. States that her RA meds are not working and is thus having increase in R shoulder and [place] joints. More discomfort then usual. Feeling tight everywhere   Medications patient has been prescribed: Advil           Past Medical History:  Diagnosis Date   ALLERGIC RHINITIS    Anal fissure    Anxiety    ANXIETY DEPRESSION    Back pain    CONSTIPATION    Constipation    DEPRESSION    Esophageal reflux    Food allergy    Avacados   Gastritis    GERD (gastroesophageal reflux disease)    HYPERCHOLESTEROLEMIA    Hyperlipidemia    Hypertension    OBESITY    Occipital neuralgia    Palpitations    Rheumatoid arthritis ([place]) 12/24/2017    Allergies  Allergen Reactions   Ciprofloxacin     toxicity   Levaquin [Levofloxacin] Anxiety    Possible CNS side effects. TOXICITY   Moxifloxacin     TOXICITY     Review of Systems:  [name] headache, visual changes, nausea, vomiting, diarrhea, constipation, dizziness, abdominal pain, skin rash, fevers, chills, night sweats, weight loss, swollen lymph nodes, body aches, joint swelling, chest pain, shortness of breath, mood changes. POSITIVE muscle aches  Objective  Blood pressure 120/82, pulse 75, height  '5\' 10"'$  (1.778 m), weight 261 lb (118.4 kg), SpO2 98 %.   General: [name] apparent distress alert and oriented x3 mood and affect normal, dressed appropriately.  HEENT: Pupils equal, extraocular movements intact  Respiratory: Patient's speak in full sentences and does not appear short of breath  Cardiovascular: [name] lower extremity edema, non tender, [name] erythema  Low back has loss of lordosis, tightness noted in the lumbar spine, tightness with FABER, Negative SLT  5/5 strength of LE  Osteopathic findings  C2 flexed rotated and side bent right C6 flexed rotated and side bent left T3 extended rotated and side bent right inhaled rib T9 extended rotated and side bent left L2 flexed rotated and side bent right Sacrum right on right       Assessment and Plan:  [name] problem-specific Assessment & Plan notes found for this encounter.   Nonallopathic problems  Decision today to treat with OMT was based on Physical Exam  After verbal consent patient was treated with HVLA, ME, FPR techniques in cervical, rib, thoracic, lumbar, and sacral  areas  Patient tolerated the procedure well with improvement in symptoms  Patient given exercises, stretches and lifestyle modifications  See medications in patient instructions if given  Patient will follow up in 4-8 weeks      The above documentation has been reviewed and is accurate and complete Lyndal Pulley, DO  Note: This dictation was prepared with Dragon dictation along with smaller phrase technology. Any transcriptional errors that result from this process are unintentional.

## 2021-07-12 NOTE — Patient Instructions (Signed)
Good to see you  Ice is your friend Call me if you need anything Happy anniversary  PT at [address]  Have fun in [place] See me again in 6ish weeks

## 2021-07-13 ENCOUNTER — Encounter: Payer: Self-pay | Admitting: Family Medicine

## 2021-07-13 NOTE — Assessment & Plan Note (Signed)
Chronic problem, underlying autoimmune, concern for possible flare, discussed prednisone, tordal and patient declined. Discussed continue to work on ROM. Discussed HEP. RTC in 4-8 weeks

## 2021-07-16 NOTE — Progress Notes (Unsigned)
  Office: [phone]  /  Fax: [phone]    Date: July 30, 2021   Appointment Start Time: *** Duration: *** minutes Provider: Glennie Isle, Psy.D. Type of Session: Individual Therapy  Location of Patient: {gbptloc:23249} Location of Provider: Provider's Home (private office) Type of Contact: Telepsychological Visit via {MyChart Video Visit  Session Content: April [name] is a 45 y.o. female presenting for a follow-up appointment to address the previously established treatment goal of increasing coping skills. Today's appointment was a telepsychological visit due to COVID-19. [name] provided verbal consent for today's telepsychological appointment and she is aware she is responsible for securing confidentiality on her end of the session. Prior to proceeding with today's appointment, [name]'s physical location at the time of this appointment was obtained as well a phone number she could be reached at in the event of technical difficulties. Acquanetta and this provider participated in today's telepsychological service.   This provider conducted a brief check-in. *** [name] was receptive to today's appointment as evidenced by openness to sharing, responsiveness to feedback, and {gbreceptiveness:23401}.  Mental Status Examination:  Appearance: {Appearance:22431} Behavior: {Behavior:22445} Mood: {gbmood:21757} Affect: {Affect:22436} Speech: {Speech:22432} Eye Contact: {Eye Contact:22433} Psychomotor Activity: {Motor Activity:22434} Gait: {gbgait:23404} Thought Process: {thought process:22448}  Thought Content/Perception: {disturbances:22451} Orientation: {Orientation:22437} Memory/Concentration: {gbcognition:22449} Insight/Judgment: {Insight:22446}  Interventions:  {Interventions for Progress Notes:23405}  DSM-5 Diagnosis(es):   F32.89 Other Specified Depressive Disorder, Emotional Eating and Binge Eating Behaviors  Treatment Goal & Progress: During the initial appointment with  this provider, the following treatment goal was established: increase coping skills. [name] has demonstrated progress in her goal as evidenced by {gbtxprogress:22839}. [name] also {gbtxprogress2:22951}.  Plan: The next appointment will be scheduled in {gbweeks:21758}, which will be {gbtxmodality:23402}. The next session will focus on {Plan for Next Appointment:23400}.

## 2021-07-16 NOTE — Progress Notes (Signed)
Chief Complaint:   OBESITY April [name] is here to discuss her progress with her obesity treatment plan along with follow-up of her obesity related diagnoses. April [name] is on the Category 3 Plan with lunch options and states she is following her eating plan approximately 10% of the time. April [name] states she is not currently exercising.  Today's visit was #: 4 Starting weight: 263 lbs Starting date: 05/17/2021 Today's weight: 257 lbs Today's date: 07/11/2021 Total lbs lost to date: 6 Total lbs lost since last in-office visit: +1  Interim History: Over the past 2 weeks, April [name] did not meal prep or plan. She is an all or nothing thinker and beats herself up for not being better with her food and lifestyle choices.   Assessment/Plan:  No orders of the defined types were placed in this encounter.   There are no discontinued medications.   No orders of the defined types were placed in this encounter.    1. 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 Medication: Lexapro, Buspar, and Ambien.  Plan:  Continue medications, journal, and CALM APP. Continue with Dr. Mallie Mussel and consider EAP for counseling, as well.   2. Obesity with current BMI of 36.9  [name] is currently in the action stage of change. As such, her goal is to continue with weight loss efforts. She has agreed to the Category 3 Plan with lunch options.   Continue with Dr. Mallie Mussel for emotional eating and "all or nothing" thoughts.  Exercise goals: Walk 5 minutes per day. All adults should avoid inactivity. Some physical activity is better than none, and adults who participate in any amount of physical activity gain some health benefits.  Behavioral modification strategies: meal planning and cooking strategies and planning for success.  April [name] has agreed to follow-up with our clinic in 3 weeks. She was informed of the importance of frequent  follow-up visits to maximize her success with intensive lifestyle modifications for her multiple health conditions.   Objective:   Blood pressure (!) 142/90, pulse 76, temperature 98.1 F (36.7 C), height '5\' 10"'$  (1.778 m), weight 257 lb (116.6 kg), SpO2 99 %. Body mass index is 36.88 kg/m.  General: Cooperative, alert, well developed, in no acute distress. HEENT: Conjunctivae and lids unremarkable. Cardiovascular: Regular rhythm.  Lungs: Normal work of breathing. Neurologic: No focal deficits.   Lab Results  Component Value Date   CREATININE 0.77 05/17/2021   BUN 11 05/17/2021   NA 140 05/17/2021   K 4.8 05/17/2021   CL 103 05/17/2021   CO2 23 05/17/2021   Lab Results  Component Value Date   ALT 33 (H) 05/17/2021   AST 27 05/17/2021   ALKPHOS 96 05/17/2021   BILITOT 0.3 05/17/2021   Lab Results  Component Value Date   HGBA1C 5.7 (H) 05/17/2021   HGBA1C 5.4 05/26/2020   HGBA1C 5.5 05/26/2019   HGBA1C 5.6 05/22/2018   HGBA1C 5.8 05/06/2017   Lab Results  Component Value Date   INSULIN 16.3 05/17/2021   Lab Results  Component Value Date   TSH 0.955 05/17/2021   Lab Results  Component Value Date   CHOL 225 (A) 05/11/2021   HDL 66 05/11/2021   LDLCALC 128 05/11/2021   LDLDIRECT 96.0 05/26/2019   TRIG 179 (A) 05/11/2021   CHOLHDL 3.1 05/26/2020   Lab Results  Component Value Date   VD25OH 75.2 05/17/2021   VD25OH 59 05/26/2020  VD25OH 41.41 05/06/2017   Lab Results  Component Value Date   WBC 5.4 05/17/2021   HGB 12.6 05/17/2021   HCT 39.9 05/17/2021   MCV 92 05/17/2021   PLT 418 (A) 05/11/2021   Lab Results  Component Value Date   IRON 82 05/30/2021   FERRITIN 290.9 05/30/2021   Attestation Statements:   Reviewed by clinician on day of visit: allergies, medications, problem list, medical history, surgical history, family history, social history, and previous encounter notes.  Time spent on visit including pre-visit chart review and post-visit  care and charting was 21 minutes.   Coral Ceo, CMA, am acting as transcriptionist for [employer], DO.  I have reviewed the above documentation for accuracy and completeness, and I agree with the above. Marjory Sneddon, D.O.  The [place] was signed into law in 2016 which includes the topic of electronic health records.  This provides immediate access to information in MyChart.  This includes consultation notes, operative notes, office notes, lab results and pathology reports.  If you have any questions about what you read please let us know at your next visit so we can discuss your concerns and take corrective action if need be.  We are right here with you.

## 2021-07-20 ENCOUNTER — Other Ambulatory Visit: Payer: Self-pay | Admitting: Pharmacist

## 2021-07-20 ENCOUNTER — Other Ambulatory Visit (HOSPITAL_COMMUNITY): Payer: Self-pay

## 2021-07-20 ENCOUNTER — Ambulatory Visit: Payer: 59 | Attending: Internal Medicine | Admitting: Pharmacist

## 2021-07-20 ENCOUNTER — Other Ambulatory Visit: Payer: Self-pay

## 2021-07-20 DIAGNOSIS — M2550 Pain in unspecified joint: Secondary | ICD-10-CM | POA: Diagnosis not present

## 2021-07-20 DIAGNOSIS — E669 Obesity, unspecified: Secondary | ICD-10-CM | POA: Diagnosis not present

## 2021-07-20 DIAGNOSIS — M0589 Other rheumatoid arthritis with rheumatoid factor of multiple sites: Secondary | ICD-10-CM | POA: Diagnosis not present

## 2021-07-20 DIAGNOSIS — Z79899 Other long term (current) drug therapy: Secondary | ICD-10-CM | POA: Diagnosis not present

## 2021-07-20 DIAGNOSIS — M545 Low back pain, unspecified: Secondary | ICD-10-CM | POA: Diagnosis not present

## 2021-07-20 DIAGNOSIS — Z6838 Body mass index (BMI) 38.0-38.9, adult: Secondary | ICD-10-CM | POA: Diagnosis not present

## 2021-07-20 DIAGNOSIS — H15101 Unspecified episcleritis, right eye: Secondary | ICD-10-CM | POA: Diagnosis not present

## 2021-07-20 DIAGNOSIS — L409 Psoriasis, unspecified: Secondary | ICD-10-CM | POA: Diagnosis not present

## 2021-07-20 MED ORDER — METHOTREXATE SODIUM CHEMO INJECTION 50 MG/2ML
INTRAMUSCULAR | 0 refills | Status: DC
  Filled 2021-07-20: qty 12, 84d supply, fill #0

## 2021-07-20 MED ORDER — CIMZIA PREFILLED 2 X 200 MG/ML ~~LOC~~ PSKT
PREFILLED_SYRINGE | SUBCUTANEOUS | 6 refills | Status: DC
  Filled 2021-07-20: qty 2, 30d supply, fill #0
  Filled 2021-07-20: qty 2, 28d supply, fill #0

## 2021-07-20 MED ORDER — CIMZIA PREFILLED 2 X 200 MG/ML ~~LOC~~ PSKT
PREFILLED_SYRINGE | SUBCUTANEOUS | 6 refills | Status: DC
  Filled 2021-07-20 – 2021-07-27 (×2): qty 2, 28d supply, fill #0
  Filled 2021-07-31: qty 1, 28d supply, fill #0
  Filled 2021-08-23: qty 1, 28d supply, fill #1
  Filled 2021-09-21: qty 1, 28d supply, fill #2
  Filled 2021-10-22: qty 1, 28d supply, fill #3
  Filled 2021-11-21: qty 1, 28d supply, fill #4
  Filled 2021-12-14: qty 1, 28d supply, fill #5

## 2021-07-20 MED ORDER — FOLIC ACID 1 MG PO TABS
1.0000 mg | ORAL_TABLET | Freq: Every day | ORAL | 1 refills | Status: DC
  Filled 2021-07-20: qty 90, 90d supply, fill #0
  Filled 2021-10-21: qty 90, 90d supply, fill #1

## 2021-07-20 NOTE — Progress Notes (Signed)
   S: Patient presents today telephonically for review of their specialty medication.   Patient will start taking Cimzia (certolizumab) for rheumatoid arthritis. Patient is managed by Gavin Pound for this.   Dosing: Note: Each 400 mg dose should be administered as 2 injections of 200 mg each. Rheumatoid arthritis: SubQ: Initial: 400 mg, repeat dose 2 and 4 weeks after initial dose; Maintenance: 200 mg every other week. May consider maintenance dose of 400 mg every 4 weeks. May be administered alone or in combination with methotrexate.  Monitoring: none  Adverse events: none   O:     Lab Results  Component Value Date   WBC 5.4 05/17/2021   HGB 12.6 05/17/2021   HCT 39.9 05/17/2021   MCV 92 05/17/2021   PLT 418 (A) 05/11/2021      Chemistry      Component Value Date/Time   NA 140 05/17/2021 0901   K 4.8 05/17/2021 0901   CL 103 05/17/2021 0901   CO2 23 05/17/2021 0901   BUN 11 05/17/2021 0901   CREATININE 0.77 05/17/2021 0901   CREATININE 0.87 05/26/2020 0841   GLU 79 05/11/2021 0000      Component Value Date/Time   CALCIUM 10.1 05/17/2021 0901   ALKPHOS 96 05/17/2021 0901   AST 27 05/17/2021 0901   ALT 33 (H) 05/17/2021 0901   BILITOT 0.3 05/17/2021 0901     A/P: 1. Medication review: patient currently prescribed Cimzia for rheumatoid arthritis and plans to start it on Tuesday. Reviewed the medication with the patient, including the following: Cimzia is a TNF blocking agent used in the treatment of rheumatoid arthritis. She will review videos on how to administer the medication. Possible adverse effects include GI upset, antibody development, increased risk of infection, hypersensitivity, demyelinating CNS disease, hematologic effects, and increased risk of malignancy. Use with caution in patients with heart failure. Avoid use of live vaccinations and let doctor know of any infections as treatment with Cimzia may need to be held during illness. No recommendations for  any changes.  Benard Halsted, PharmD, [place], [place] [phone]

## 2021-07-27 ENCOUNTER — Other Ambulatory Visit (HOSPITAL_COMMUNITY): Payer: Self-pay

## 2021-07-27 ENCOUNTER — Other Ambulatory Visit: Payer: Self-pay | Admitting: Internal Medicine

## 2021-07-27 MED ORDER — ESCITALOPRAM OXALATE 20 MG PO TABS
20.0000 mg | ORAL_TABLET | Freq: Every day | ORAL | 1 refills | Status: DC
  Filled 2021-07-27: qty 90, 90d supply, fill #0
  Filled 2021-10-21: qty 90, 90d supply, fill #1

## 2021-07-29 ENCOUNTER — Encounter (INDEPENDENT_AMBULATORY_CARE_PROVIDER_SITE_OTHER): Payer: Self-pay

## 2021-07-30 ENCOUNTER — Telehealth (INDEPENDENT_AMBULATORY_CARE_PROVIDER_SITE_OTHER): Payer: 59 | Admitting: Psychology

## 2021-07-30 ENCOUNTER — Ambulatory Visit: Payer: 59 | Attending: Family Medicine

## 2021-07-30 ENCOUNTER — Other Ambulatory Visit (HOSPITAL_COMMUNITY): Payer: Self-pay

## 2021-07-30 ENCOUNTER — Other Ambulatory Visit: Payer: Self-pay

## 2021-07-30 DIAGNOSIS — M6281 Muscle weakness (generalized): Secondary | ICD-10-CM | POA: Insufficient documentation

## 2021-07-30 DIAGNOSIS — G8929 Other chronic pain: Secondary | ICD-10-CM | POA: Insufficient documentation

## 2021-07-30 DIAGNOSIS — M545 Low back pain, unspecified: Secondary | ICD-10-CM | POA: Insufficient documentation

## 2021-07-30 MED FILL — Etonogestrel-Ethinyl Estradiol VA Ring 0.12-0.015 MG/24HR: VAGINAL | 84 days supply | Qty: 3 | Fill #1 | Status: AC

## 2021-07-30 NOTE — Therapy (Signed)
April [name], [name], [address] Phone: [phone]   Fax:  [phone]  Physical Therapy Evaluation  Patient Details  Name: April [name] MRN: [id number] Date of Birth: Mar 10, 1976 Referring Provider (PT): Lyndal Pulley, DO   Encounter Date: 07/30/2021   PT End of Session - 07/30/21 1327     Visit Number 1    Number of Visits 13    Date for PT Re-Evaluation 09/15/21    Authorization Type UMR    PT Start Time 1330    PT Stop Time [id number]    PT Time Calculation (min) 45 min    Activity Tolerance Patient tolerated treatment well    Behavior During Therapy [facility] for tasks assessed/performed             Past Medical History:  Diagnosis Date   ALLERGIC RHINITIS    Anal fissure    Anxiety    ANXIETY DEPRESSION    Back pain    CONSTIPATION    Constipation    DEPRESSION    Esophageal reflux    Food allergy    [place]   Gastritis    GERD (gastroesophageal reflux disease)    HYPERCHOLESTEROLEMIA    Hyperlipidemia    Hypertension    OBESITY    Occipital neuralgia    Palpitations    Rheumatoid arthritis ([place]) 12/24/2017    Past Surgical History:  Procedure Laterality Date   NO PAST SURGERIES      There were no vitals filed for this visit.    Subjective Assessment - 07/30/21 1328     Subjective She reports having RA and over the past year she has changed meds twice. She finally had an MRI and physician said it looks more like a pinched nerve and has suggested an epidural, but patient wants to try PT first. She reports the pain is worse on the Rt side of the low back/SI region. She reports the pain has been ongoing for a year without known cause. She denies and previous history of back pain or injury. She denies any numbness or tingling. No red flag symptoms. She denies pain currently. She reports pain at worst 8/10 with repetitive bending and lifting or static standing described as stabbing  and grabbing. Pain is relieved with walking and sleeping with knee pillow    Pertinent History RA    Limitations Standing;Lifting    How long can you sit comfortably? no issues    How long can you stand comfortably? 5 minutes    How long can you walk comfortably? 20 minutes +    Diagnostic tests IMPRESSION:  MRI LUMBAR SPINE IMPRESSION:     1. Moderately advanced degenerative disc disease at L5-S1 with  associated left subarticular disc protrusion, closely approximating  and potentially irritating the descending left S1 nerve root.  Prominent discogenic reactive marrow edema at this level could  contribute to lower back pain.  2. Shallow right subarticular disc protrusion at L4-5 without  significant stenosis or neural impingement.  3. Mild bilateral L3 through L5 foraminal stenosis related to disc  bulge and facet hypertrophy     MRI PELVIS IMPRESSION:     1. Normal MRI of the SI joints.  2. Mild edema about the greater trochanters bilaterally, left  slightly worse than right, suggesting trochanteric bursitis.  3. Suspected focal labral/acetabular tear at the superolateral right  hip.    Patient Stated Goals I'd like to try and lessen the pain  if possible and learn how to manage and be capable of learning to manage it at home.    Currently in Pain? No/denies                [facility] PT Assessment - 07/30/21 0001       Assessment   Medical Diagnosis M54.50 (ICD-10-CM) - Low back pain, unspecified back pain laterality, unspecified chronicity, unspecified whether sciatica present    Referring Provider (PT) Lyndal Pulley, DO    Onset Date/Surgical Date --   >1 year   Hand Dominance Right    Next MD Visit 08/23/21    Prior Therapy yes for plantar fascitis      Precautions   Precautions None      Restrictions   Weight Bearing Restrictions No      Balance Screen   Has the patient fallen in the past 6 months Yes    How many times? 2   stepped in hole while hiking and slipped on water in  her kitchen.   Has the patient had a decrease in activity level because of a fear of falling?  No    Is the patient reluctant to leave their home because of a fear of falling?  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      Cognition   Overall Cognitive Status Within Functional Limits for tasks assessed      Observation/Other Assessments   Focus on Therapeutic Outcomes (FOTO)  50% function to 60% predicted      Sensation   Light Touch Not tested      Coordination   Gross Motor Movements are Fluid and Coordinated Yes      Posture/Postural Control   Posture/Postural Control Postural limitations    Postural Limitations Decreased lumbar lordosis      AROM   Lumbar Flexion Full, Gower sign present   pain during ascent and descent   Lumbar Extension WFL    Lumbar - Right Side Bend WFL    Lumbar - Left Side Bend WFL    Lumbar - Right Rotation WFL   pain in low back   Lumbar - Left Rotation WFL   pain in low back     Strength   Overall Strength Comments pain hip flexion    Right Hip Flexion 4/5    Right Hip Extension 4/5    Right Hip ABduction 4/5    Left Hip Flexion 4/5    Left Hip Extension 4/5    Left Hip ABduction 4/5      Flexibility   Hamstrings tight bilaterally    Quadriceps mild tightness bilaterally    Piriformis tight bilaterally      Palpation   Spinal mobility L-spine mobility WNL    Palpation comment Lt iliac crest and ASIS elevated, TTP bilateral glute max, lumbar paraspinals      Special Tests   Other special tests (+) SLR RLE (+) FABER bilaterally (+) Long Sit, LLE appearing longer (+) Sacral Thrust                        Objective  measurements  completed on examination: See above findings.       [facility] Adult PT Treatment/Exercise - 07/30/21 0001       Self-Care   Self-Care Other Self-Care Comments    Other Self-Care Comments  see patient education      Manual Therapy   Manual therapy comments muscle energy technique to improve pelvic alignment                     PT Education - 07/30/21 1436     Education Details Education on current condition, POC, HEP, FOTO score, centralization phenomenon.    Person(s) Educated Patient    Methods Explanation;Demonstration;Tactile cues;Verbal cues;Handout    Comprehension Verbalized understanding;Returned demonstration;Verbal cues required;Tactile cues required              PT Short Term Goals - 07/30/21 1344       PT SHORT TERM GOAL #1   Title Patient will be independent with initial home program.    Baseline Issued at eval.    Status New    Target Date 08/13/21      PT SHORT TERM GOAL #2   Title Patient will demonstrate no Gower sign when returning to standing from trunk flexed position    Baseline Gower sign    Status New    Target Date 08/20/21      PT SHORT TERM GOAL #3   Title Patient will report tolerating at least 15 minutes of standing activity.    Baseline 5 minutes    Status New    Target Date 08/20/21      PT SHORT TERM GOAL #4   Title Patient will demonstrate proper pelvic alignment maintained between session.    Baseline Lt posterior rotated innominate    Status New    Target Date 08/20/21               PT Long Term Goals - 07/30/21 1346       PT LONG TERM GOAL #1   Title Patient will be independent with advanced home program to assist in management of her chronic condition.    Baseline n/a    Status New    Target Date 09/10/21      PT LONG TERM GOAL #2   Title Patient will demonstrate at least 4+/5 bilateral hip strength to improve stability about the chain with prolonged standing and walking activity.    Baseline see  flowsheet    Status New    Target Date 09/10/21      PT LONG TERM GOAL #3   Title Patient will score at least 60% function on FOTO to signify clinically meaningful improvement in functional abilities.    Baseline 50%    Status New    Target Date 09/10/21      PT LONG TERM GOAL #4   Title Patient will report pain at worst as 5/10 indicative of improvement in her overall condition.    Baseline 8/10    Status New    Target Date 09/10/21      PT LONG TERM GOAL #5   Title Patient will demonstrate proper bending and lifting mechanics of light weight to improve ability to lift and carry laundry at home.    Baseline unable    Status New    Target Date 09/10/21                    Plan - 07/30/21 1425  Clinical Impression Statement Patient is a 45 y/o female with chief complaint of chronic low back/gluteal pain that has been ongoing for a year without known cause. Upon assessment she has findings suggestive of lumbar radiculopathy and SIJD. She has a positive SLR on the RLE as well as positive SI special tests bilaterally. She is noted to have a Lt posterior rotated innominate that easily corrects with muscle energy technique. Unable to assess response to repeated movement as patient was not experiencing pain at today's evaluation, but plan to determine if direction preference is prensent at future sessions. She is noted to have tightness in bilateral hip musculature as well as hip weakness. She will benefit from skilled PT to address the above stated deficits and train in a home program to assist in management of her chronic condition.    Personal Factors and Comorbidities Comorbidity 1;Time since onset of injury/illness/exacerbation;Profession    Examination-Activity Limitations Lift;Squat;Bend;Carry    Examination-Participation Restrictions Laundry;Cleaning    Stability/Clinical Decision Making Stable/Uncomplicated    Clinical Decision Making Low    Rehab Potential Good    PT  Frequency 2x / week    PT Duration 6 weeks    PT Treatment/Interventions ADLs/Self Care Home Management;Cryotherapy;Electrical Stimulation;Iontophoresis '4mg'$ /ml Dexamethasone;Moist Heat;Traction;Therapeutic activities;Therapeutic exercise;Gait training;Stair training;Balance training;Neuromuscular re-education;Patient/family education;Manual techniques;Passive range of motion;Dry needling;Taping;Spinal Manipulations    PT Next Visit Plan assess response to HEP, check hip levels, begin core strengthening, hamstring/quad/piriformis stretching, assess for directional preference, sciatic nerve glides    PT Home Exercise Plan Access Code: [email or web address]    Consulted and Agree with Plan of Care Patient             Patient will benefit from skilled therapeutic intervention in order to improve the following deficits and impairments:  Pain, Impaired flexibility, Improper body mechanics, Postural dysfunction, Decreased strength  Visit Diagnosis: Chronic bilateral low back pain without sciatica  Muscle weakness (generalized)     Problem List Patient Active Problem List   Diagnosis Date Noted   Low back pain 05/31/2021   Elevated LFTs 05/30/2021   Thrombocytosis 05/30/2021   Encounter for general adult medical examination with abnormal findings 05/30/2021   Abscess of skin of abdomen 05/30/2021   Psychophysiological insomnia 05/30/2021   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   Hypertension 12/14/2019   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 04/19/2018   Allergic rhinitis due to animal hair and dander 02/20/2018   Rheumatoid arthritis ([place]) 12/24/2017   Chronic sinusitis 01/07/2017   Major depressive disorder, recurrent episode, moderate ([place]) 12/02/2014   Hyperlipidemia 11/16/2008   Obesity 11/16/2008   ANXIETY DEPRESSION 11/16/2008   Esophageal reflux 11/16/2008   Constipation 11/16/2008   Gwendolyn Grant, PT, DPT, ATC 07/30/21 2:37 PM  [facility] [address] [place], [name], [address] Phone: [phone]   Fax:  [phone]  Name: ADALYNE HEMBREE MRN: [id number] Date of Birth: 04-20-1976

## 2021-07-31 ENCOUNTER — Other Ambulatory Visit (HOSPITAL_COMMUNITY): Payer: Self-pay

## 2021-08-02 ENCOUNTER — Encounter: Payer: Self-pay | Admitting: Physical Therapy

## 2021-08-02 ENCOUNTER — Ambulatory Visit: Payer: 59 | Admitting: Physical Therapy

## 2021-08-02 ENCOUNTER — Other Ambulatory Visit: Payer: Self-pay

## 2021-08-02 DIAGNOSIS — M545 Low back pain, unspecified: Secondary | ICD-10-CM | POA: Diagnosis not present

## 2021-08-02 DIAGNOSIS — G8929 Other chronic pain: Secondary | ICD-10-CM

## 2021-08-02 DIAGNOSIS — M6281 Muscle weakness (generalized): Secondary | ICD-10-CM | POA: Diagnosis not present

## 2021-08-02 NOTE — Therapy (Signed)
[place], [name], [address] Phone: [phone]   Fax:  [phone]  Physical Therapy Treatment  Patient Details  Name: April [name] MRN: [id number] Date of Birth: 04-25-1976 Referring Provider (PT): Lyndal Pulley, DO   Encounter Date: 08/02/2021   PT End of Session - 08/02/21 1319     Visit Number 2    Number of Visits 13    Date for PT Re-Evaluation 09/15/21    Authorization Type UMR    PT Start Time 1312    PT Stop Time 1355    PT Time Calculation (min) 43 min    Activity Tolerance Patient tolerated treatment well    Behavior During Therapy WFL for tasks assessed/performed             Past Medical History:  Diagnosis Date   ALLERGIC RHINITIS    Anal fissure    Anxiety    ANXIETY DEPRESSION    Back pain    CONSTIPATION    Constipation    DEPRESSION    Esophageal reflux    Food allergy    Avacados   Gastritis    GERD (gastroesophageal reflux disease)    HYPERCHOLESTEROLEMIA    Hyperlipidemia    Hypertension    OBESITY    Occipital neuralgia    Palpitations    Rheumatoid arthritis ([place]) 12/24/2017    Past Surgical History:  Procedure Laterality Date   NO PAST SURGERIES      There were no vitals filed for this visit.   Subjective Assessment - 08/02/21 1316     Subjective The back bends help my back pain. Yesterday I wore my danskos to work yesterday and my back is hurting more today.    Diagnostic tests IMPRESSION:  MRI LUMBAR SPINE IMPRESSION:     1. Moderately advanced degenerative disc disease at L5-S1 with  associated left subarticular disc protrusion, closely approximating  and potentially irritating the descending left S1 nerve root.  Prominent discogenic reactive marrow edema at this level could  contribute to lower back pain.  2. Shallow right subarticular disc protrusion at L4-5 without  significant stenosis or neural impingement.  3. Mild bilateral L3 through  L5 foraminal stenosis related to disc  bulge and facet hypertrophy     MRI PELVIS IMPRESSION:     1. Normal MRI of the SI joints.  2. Mild edema about the greater trochanters bilaterally, left  slightly worse than right, suggesting trochanteric bursitis.  3. Suspected focal labral/acetabular tear at the superolateral right  hip.    Currently in Pain? Yes    Pain Score 4     Pain Location Back    Pain Orientation Lower;Right    Pain Descriptors / Indicators Aching    Pain Type Chronic pain    Aggravating Factors  dansko shoes    Pain Relieving Factors sneakers                               [facility] Adult PT Treatment/Exercise - 08/02/21 0001       Exercises   Exercises Knee/Hip      Lumbar Exercises: Supine   Pelvic Tilt 15 reps    Pelvic Tilt Limitations cues to focus posterior tilt    Dead Bug 20 reps    Dead Bug Limitations Level 1 with cues for abdominal draw in and breathing    Bridge 10 reps  Bridge Limitations with initial PPT      Knee/Hip Exercises: Stretches   Active Hamstring Stretch 3 reps;30 seconds    Active Hamstring Stretch Limitations supine with strap    Quad Stretch 3 reps;30 seconds    Quad Stretch Limitations prone with strap      Knee/Hip Exercises: Prone   Hip Extension 10 reps    Hip Extension Limitations alternating                       PT Short Term Goals - 07/30/21 1344       PT SHORT TERM GOAL #1   Title Patient will be independent with initial home program.    Baseline Issued at eval.    Status New    Target Date 08/13/21      PT SHORT TERM GOAL #2   Title Patient will demonstrate no Gower sign when returning to standing from trunk flexed position    Baseline Gower sign    Status New    Target Date 08/20/21      PT SHORT TERM GOAL #3   Title Patient will report tolerating at least 15 minutes of standing activity.    Baseline 5 minutes    Status New    Target Date 08/20/21      PT SHORT TERM GOAL #4    Title Patient will demonstrate proper pelvic alignment maintained between session.    Baseline Lt posterior rotated innominate    Status New    Target Date 08/20/21               PT Long Term Goals - 07/30/21 1346       PT LONG TERM GOAL #1   Title Patient will be independent with advanced home program to assist in management of her chronic condition.    Baseline n/a    Status New    Target Date 09/10/21      PT LONG TERM GOAL #2   Title Patient will demonstrate at least 4+/5 bilateral hip strength to improve stability about the chain with prolonged standing and walking activity.    Baseline see flowsheet    Status New    Target Date 09/10/21      PT LONG TERM GOAL #3   Title Patient will score at least 60% function on FOTO to signify clinically meaningful improvement in functional abilities.    Baseline 50%    Status New    Target Date 09/10/21      PT LONG TERM GOAL #4   Title Patient will report pain at worst as 5/10 indicative of improvement in her overall condition.    Baseline 8/10    Status New    Target Date 09/10/21      PT LONG TERM GOAL #5   Title Patient will demonstrate proper bending and lifting mechanics of light weight to improve ability to lift and carry laundry at home.    Baseline unable    Status New    Target Date 09/10/21                   Plan - 08/02/21 1418     Clinical Impression Statement Pt arrives reporting compliance with HEP and improvemetn in pain with standig lumbar extension. Continued with PT POC and added hamsting and  quad stretches to HEP without adverse effects. While in prone- attempted hip extentions however this increased her right sided back pain. Able to progress PPT  to deadbug without pain. This was also added to HEP. At end of session pt had stiffness after being in prone and needed hands on knees to come upright. Used physioball rollouts to stretch lumbar which she reported helped ease stiffness.    PT Next  Visit Plan assess response to HEP, check hip levels, begin core strengthening, hamstring/quad/piriformis stretching, assess for directional preference, sciatic nerve glides; consider prone over pillows    PT Home Exercise Plan Access Code: [email or web address]             Patient will benefit from skilled therapeutic intervention in order to improve the following deficits and impairments:  Pain, Impaired flexibility, Improper body mechanics, Postural dysfunction, Decreased strength  Visit Diagnosis: Chronic bilateral low back pain without sciatica  Muscle weakness (generalized)     Problem List Patient Active Problem List   Diagnosis Date Noted   Low back pain 05/31/2021   Elevated LFTs 05/30/2021   Thrombocytosis 05/30/2021   Encounter for general adult medical examination with abnormal findings 05/30/2021   Abscess of skin of abdomen 05/30/2021   Psychophysiological insomnia 05/30/2021   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   Hypertension 12/14/2019   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

## 2021-08-03 ENCOUNTER — Other Ambulatory Visit (HOSPITAL_COMMUNITY): Payer: Self-pay

## 2021-08-06 ENCOUNTER — Ambulatory Visit (INDEPENDENT_AMBULATORY_CARE_PROVIDER_SITE_OTHER): Payer: 59 | Admitting: Family Medicine

## 2021-08-06 ENCOUNTER — Encounter (INDEPENDENT_AMBULATORY_CARE_PROVIDER_SITE_OTHER): Payer: Self-pay | Admitting: Family Medicine

## 2021-08-06 ENCOUNTER — Other Ambulatory Visit (HOSPITAL_COMMUNITY): Payer: Self-pay

## 2021-08-06 ENCOUNTER — Other Ambulatory Visit: Payer: Self-pay

## 2021-08-06 VITALS — BP 128/68 | HR 86 | Temp 98.0°F | Ht 70.0 in | Wt 257.0 lb

## 2021-08-06 DIAGNOSIS — E559 Vitamin D deficiency, unspecified: Secondary | ICD-10-CM

## 2021-08-06 DIAGNOSIS — Z9189 Other specified personal risk factors, not elsewhere classified: Secondary | ICD-10-CM | POA: Diagnosis not present

## 2021-08-06 DIAGNOSIS — Z6837 Body mass index (BMI) 37.0-37.9, adult: Secondary | ICD-10-CM

## 2021-08-06 DIAGNOSIS — I10 Essential (primary) hypertension: Secondary | ICD-10-CM

## 2021-08-06 DIAGNOSIS — R7303 Prediabetes: Secondary | ICD-10-CM | POA: Diagnosis not present

## 2021-08-07 NOTE — Progress Notes (Signed)
Chief Complaint:   OBESITY April [name] is here to discuss her progress with her obesity treatment plan along with follow-up of her obesity related diagnoses. April [name] is on the Category 3 Plan and states she is following her eating plan approximately 0% of the time. April [name] states she is walking on the treadmill for 20 minutes 3 times per week.  Today's visit was #: 5 Starting weight: 263 lbs Starting date: 05/17/2021 Today's weight: 257 lbs Today's date: 08/07/2021 Total lbs lost to date: 6 lbs Total lbs lost since last in-office visit: 0  Interim History: April [name] has increased her activity to 5 thousand steps per day. April [name] states she is sick of meal plan. She feels like a dog that eats the same thing every day.  Subjective:   1. Vitamin D deficiency She is currently taking OTC Vitamin D3 5,000 IU each week. She denies nausea, vomiting or muscle weakness.  2. Prediabetes April [name] has a diagnosis of prediabetes based on her elevated HgA1c and was informed this puts her at greater risk of developing diabetes. She continues to work on diet and exercise to decrease her risk of diabetes. She denies nausea or hypoglycemia. She is not on medications.   3. Essential hypertension April [name] is currently taking Cozaar. Review: taking medications as instructed, no medication side effects noted, no chest pain on exertion, no dyspnea on exertion, no swelling of ankles.   4. At risk for malnutrition April [name] is at increased risk for malnutrition due to inadequate intake of certain nutrients.   Assessment/Plan:  No orders of the defined types were placed in this encounter.   There are no discontinued medications.   No orders of the defined types were placed in this encounter.    1. Vitamin D deficiency Low Vitamin D level contributes to fatigue and are associated with obesity, breast, and colon cancer. April [name] agrees to continue to take OTC Vitamin D3 5,000 IU daily and she will follow-up for  routine testing of Vitamin D, at least 2-3 times per year to avoid over-replacement.  2. Prediabetes April [name] will continue to work on weight loss, exercise, and decreasing simple carbohydrates to help decrease the risk of diabetes. We reviewed the importance of decreasing simple carbohydrates and increased proteins. April [name] declines medication again at this time.   3. Essential hypertension April [name]'s initial blood pressure was elevated but upon recheck it was within normal limits. We reviewed to keep an eye on it and have decreased salt intake. She will continue Losartan. She is working on healthy weight loss and exercise to improve blood pressure control. We will watch for signs of hypotension as she continues her lifestyle modifications.  4. At risk for malnutrition April [name] was given approximately 15 minutes of counseling today regarding prevention of malnutrition and ways to meet macronutrient goals..    5. Obesity with current BMI of 37.0 April [name] is currently in the action stage of change. As such, her goal is to continue with weight loss efforts. She has agreed to keeping a food journal and adhering to recommended goals of 1300-1400 calories and 90 grams of protein daily.  Exercise goals:  As is.  Behavioral modification strategies: meal planning and cooking strategies, planning for success, and keeping a strict food journal.  April [name] has agreed to follow-up with our clinic in 2-3 weeks. She was informed of the importance of frequent follow-up visits to maximize her success with intensive lifestyle modifications for her multiple health conditions.   Objective:   Blood pressure 128/68,  pulse 86, temperature 98 F (36.7 C), height 5\' 10"  (1.778 m), weight 257 lb (116.6 kg), SpO2 97 %. Body mass index is 36.88 kg/m.  General: Cooperative, alert, well developed, in no acute distress. HEENT: Conjunctivae and lids unremarkable. Cardiovascular: Regular rhythm.  Lungs: Normal work of  breathing. Neurologic: No focal deficits.   Lab Results  Component Value Date   CREATININE 0.77 05/17/2021   BUN 11 05/17/2021   NA 140 05/17/2021   K 4.8 05/17/2021   CL 103 05/17/2021   CO2 23 05/17/2021   Lab Results  Component Value Date   ALT 33 (H) 05/17/2021   AST 27 05/17/2021   ALKPHOS 96 05/17/2021   BILITOT 0.3 05/17/2021   Lab Results  Component Value Date   HGBA1C 5.7 (H) 05/17/2021   HGBA1C 5.4 05/26/2020   HGBA1C 5.5 05/26/2019   HGBA1C 5.6 05/22/2018   HGBA1C 5.8 05/06/2017   Lab Results  Component Value Date   INSULIN 16.3 05/17/2021   Lab Results  Component Value Date   TSH 0.955 05/17/2021   Lab Results  Component Value Date   CHOL 225 (A) 05/11/2021   HDL 66 05/11/2021   LDLCALC 128 05/11/2021   LDLDIRECT 96.0 05/26/2019   TRIG 179 (A) 05/11/2021   CHOLHDL 3.1 05/26/2020   Lab Results  Component Value Date   VD25OH 75.2 05/17/2021   VD25OH 59 05/26/2020   VD25OH 41.41 05/06/2017   Lab Results  Component Value Date   WBC 5.4 05/17/2021   HGB 12.6 05/17/2021   HCT 39.9 05/17/2021   MCV 92 05/17/2021   PLT 418 (A) 05/11/2021   Lab Results  Component Value Date   IRON 82 05/30/2021   FERRITIN 290.9 05/30/2021   Attestation Statements:   Reviewed by clinician on day of visit: allergies, medications, problem list, medical history, surgical history, family history, social history, and previous encounter notes.   I, Lizbeth Bark, RMA, am acting as Location manager for [employer], DO.   I have reviewed the above documentation for accuracy and completeness, and I agree with the above. Marjory Sneddon, D.O.  The [place] was signed into law in 2016 which includes the topic of electronic health records.  This provides immediate access to information in MyChart.  This includes consultation notes, operative notes, office notes, lab results and pathology reports.  If you have any questions about what you read please  let us know at your next visit so we can discuss your concerns and take corrective action if need be.  We are right here with you.

## 2021-08-08 ENCOUNTER — Ambulatory Visit: Payer: 59

## 2021-08-08 ENCOUNTER — Other Ambulatory Visit: Payer: Self-pay

## 2021-08-08 DIAGNOSIS — M545 Low back pain, unspecified: Secondary | ICD-10-CM

## 2021-08-08 DIAGNOSIS — M6281 Muscle weakness (generalized): Secondary | ICD-10-CM | POA: Diagnosis not present

## 2021-08-08 DIAGNOSIS — G8929 Other chronic pain: Secondary | ICD-10-CM

## 2021-08-08 NOTE — Therapy (Signed)
[place], [name], [address] Phone: [phone]   Fax:  [phone]  Physical Therapy Treatment  Patient Details  Name: April [name] MRN: [id number] Date of Birth: 1976-07-07 Referring Provider (PT): Lyndal Pulley, DO   Encounter Date: 08/08/2021   PT End of Session - 08/08/21 1529     Visit Number 3    Number of Visits 13    Date for PT Re-Evaluation 09/15/21    Authorization Type UMR    PT Start Time 1530    PT Stop Time 1612    PT Time Calculation (min) 42 min    Activity Tolerance Patient tolerated treatment well    Behavior During Therapy WFL for tasks assessed/performed             Past Medical History:  Diagnosis Date   ALLERGIC RHINITIS    Anal fissure    Anxiety    ANXIETY DEPRESSION    Back pain    CONSTIPATION    Constipation    DEPRESSION    Esophageal reflux    Food allergy    Avacados   Gastritis    GERD (gastroesophageal reflux disease)    HYPERCHOLESTEROLEMIA    Hyperlipidemia    Hypertension    OBESITY    Occipital neuralgia    Palpitations    Rheumatoid arthritis ([place]) 12/24/2017    Past Surgical History:  Procedure Laterality Date   NO PAST SURGERIES      There were no vitals filed for this visit.   Subjective Assessment - 08/08/21 1530     Subjective Patient reports she is feeling about the same. The exercises are going well and they do make her feel better when she does them. She may feel "a tiny bit improved overall."    Diagnostic tests IMPRESSION:  MRI LUMBAR SPINE IMPRESSION:     1. Moderately advanced degenerative disc disease at L5-S1 with  associated left subarticular disc protrusion, closely approximating  and potentially irritating the descending left S1 nerve root.  Prominent discogenic reactive marrow edema at this level could  contribute to lower back pain.  2. Shallow right subarticular disc protrusion at L4-5 without  significant  stenosis or neural impingement.  3. Mild bilateral L3 through L5 foraminal stenosis related to disc  bulge and facet hypertrophy     MRI PELVIS IMPRESSION:     1. Normal MRI of the SI joints.  2. Mild edema about the greater trochanters bilaterally, left  slightly worse than right, suggesting trochanteric bursitis.  3. Suspected focal labral/acetabular tear at the superolateral right  hip.    Currently in Pain? Yes    Pain Score 3     Pain Location Sacrum    Pain Orientation Posterior    Pain Descriptors / Indicators Aching    Pain Type Chronic pain    Pain Onset More than a month ago    Pain Frequency Constant    Aggravating Factors  lifting, poor shoes, prolonged standing    Pain Relieving Factors movement                [facility] PT Assessment - 08/08/21 0001       Palpation   Palpation comment Lt posterior rotated innominate      Special Tests   Other special tests (+) Long sit LLE longer  [facility] Adult PT Treatment/Exercise - 08/08/21 0001       Lumbar Exercises: Supine   Clam 10 reps    Clam Limitations x2; blue band    Bridge 10 reps    Bridge Limitations partial range with PPT    Other Supine Lumbar Exercises LTR with figure 4, 1 min each      Knee/Hip Exercises: Stretches   Sports administrator 60 seconds    Quad Stretch Limitations bilateral    Other Knee/Hip Stretches RLE sciatic nerve glide 1 x 10      Knee/Hip Exercises: Sidelying   Hip ABduction 10 reps    Hip ABduction Limitations x2; bilateral      Manual Therapy   Manual therapy comments muscle energy technique to improve pelvic alignment; STM to bilateral posterior hip musculature, lumbar paraspinals                       PT Short Term Goals - 07/30/21 1344       PT SHORT TERM GOAL #1   Title Patient will be independent with initial home program.    Baseline Issued at eval.    Status New    Target Date 08/13/21      PT SHORT TERM GOAL #2   Title  Patient will demonstrate no Gower sign when returning to standing from trunk flexed position    Baseline Gower sign    Status New    Target Date 08/20/21      PT SHORT TERM GOAL #3   Title Patient will report tolerating at least 15 minutes of standing activity.    Baseline 5 minutes    Status New    Target Date 08/20/21      PT SHORT TERM GOAL #4   Title Patient will demonstrate proper pelvic alignment maintained between session.    Baseline Lt posterior rotated innominate    Status New    Target Date 08/20/21               PT Long Term Goals - 07/30/21 1346       PT LONG TERM GOAL #1   Title Patient will be independent with advanced home program to assist in management of her chronic condition.    Baseline n/a    Status New    Target Date 09/10/21      PT LONG TERM GOAL #2   Title Patient will demonstrate at least 4+/5 bilateral hip strength to improve stability about the chain with prolonged standing and walking activity.    Baseline see flowsheet    Status New    Target Date 09/10/21      PT LONG TERM GOAL #3   Title Patient will score at least 60% function on FOTO to signify clinically meaningful improvement in functional abilities.    Baseline 50%    Status New    Target Date 09/10/21      PT LONG TERM GOAL #4   Title Patient will report pain at worst as 5/10 indicative of improvement in her overall condition.    Baseline 8/10    Status New    Target Date 09/10/21      PT LONG TERM GOAL #5   Title Patient will demonstrate proper bending and lifting mechanics of light weight to improve ability to lift and carry laundry at home.    Baseline unable    Status New    Target Date 09/10/21  Plan - 08/08/21 1552     Clinical Impression Statement Patient arrives with slight Lt posterior rotated innominate that easily corrects with muscle energy technique. Held off on adding correction to HEP as the rotation was slight. Will plan to  re-assess at next visit and if assymetry remains will consider adding to home program at that time. She has mild tautness about bilateral glute max, piriformis, and lumbar paraspinals though significant tenderness present about sacral attachment of glute max and piriformis on the left. Unable to complete full range bridge as this caused increased pain, though able to complete partial range without issue. Otherwise good tolerance to progression of hip and core strengthening.    PT Next Visit Plan assess response to HEP, check hip levels, begin core strengthening, hamstring/quad/piriformis stretching, assess for directional preference, sciatic nerve glides; consider prone over pillows    PT Home Exercise Plan Access Code: [email or web address]             Patient will benefit from skilled therapeutic intervention in order to improve the following deficits and impairments:  Pain, Impaired flexibility, Improper body mechanics, Postural dysfunction, Decreased strength  Visit Diagnosis: Chronic bilateral low back pain without sciatica  Muscle weakness (generalized)     Problem List Patient Active Problem List   Diagnosis Date Noted   Low back pain 05/31/2021   Elevated LFTs 05/30/2021   Thrombocytosis 05/30/2021   Encounter for general adult medical examination with abnormal findings 05/30/2021   Abscess of skin of abdomen 05/30/2021   Psychophysiological insomnia 05/30/2021   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 [place], [name], [address] Phone: [phone]   Fax:  [phone]  Name: April [name] MRN: [id number] Date of Birth: [birth year]

## 2021-08-13 ENCOUNTER — Other Ambulatory Visit: Payer: Self-pay

## 2021-08-13 ENCOUNTER — Ambulatory Visit: Payer: 59

## 2021-08-13 DIAGNOSIS — M545 Low back pain, unspecified: Secondary | ICD-10-CM | POA: Diagnosis not present

## 2021-08-13 DIAGNOSIS — G8929 Other chronic pain: Secondary | ICD-10-CM | POA: Diagnosis not present

## 2021-08-13 DIAGNOSIS — M6281 Muscle weakness (generalized): Secondary | ICD-10-CM

## 2021-08-13 NOTE — Therapy (Signed)
[place], [name], [address] Phone: [phone]   Fax:  [phone]  Physical Therapy Treatment  Patient Details  Name: April [name] MRN: [id number] Date of Birth: 1975/11/28 Referring Provider (PT): Lyndal Pulley, DO   Encounter Date: 08/13/2021   PT End of Session - 08/13/21 1634     Visit Number 4    Number of Visits 13    Date for PT Re-Evaluation 09/15/21    Authorization Type UMR    PT Start Time 1633    PT Stop Time 1717    PT Time Calculation (min) 44 min    Activity Tolerance Patient tolerated treatment well    Behavior During Therapy WFL for tasks assessed/performed             Past Medical History:  Diagnosis Date   ALLERGIC RHINITIS    Anal fissure    Anxiety    ANXIETY DEPRESSION    Back pain    CONSTIPATION    Constipation    DEPRESSION    Esophageal reflux    Food allergy    Avacados   Gastritis    GERD (gastroesophageal reflux disease)    HYPERCHOLESTEROLEMIA    Hyperlipidemia    Hypertension    OBESITY    Occipital neuralgia    Palpitations    Rheumatoid arthritis ([place]) 12/24/2017    Past Surgical History:  Procedure Laterality Date   NO PAST SURGERIES      There were no vitals filed for this visit.   Subjective Assessment - 08/13/21 1635     Subjective Patient reports she is a little sore and achy today all over from her RA. The back has been a little painful today.    Diagnostic tests IMPRESSION:  MRI LUMBAR SPINE IMPRESSION:     1. Moderately advanced degenerative disc disease at L5-S1 with  associated left subarticular disc protrusion, closely approximating  and potentially irritating the descending left S1 nerve root.  Prominent discogenic reactive marrow edema at this level could  contribute to lower back pain.  2. Shallow right subarticular disc protrusion at L4-5 without  significant stenosis or neural impingement.  3. Mild bilateral L3  through L5 foraminal stenosis related to disc  bulge and facet hypertrophy     MRI PELVIS IMPRESSION:     1. Normal MRI of the SI joints.  2. Mild edema about the greater trochanters bilaterally, left  slightly worse than right, suggesting trochanteric bursitis.  3. Suspected focal labral/acetabular tear at the superolateral right  hip.    Currently in Pain? Yes    Pain Score 6     Pain Location Sacrum    Pain Orientation Right    Pain Descriptors / Indicators Aching    Pain Type Chronic pain    Pain Onset More than a month ago    Pain Frequency Constant    Aggravating Factors  lifting, standing    Pain Relieving Factors movement                [facility] PT Assessment - 08/13/21 0001       AROM   Lumbar Flexion Full; no gowers sign      Special Tests   Other special tests (-) Long Sit                           [facility] Adult PT Treatment/Exercise - 08/13/21 0001  Self-Care   Other Self-Care Comments  see patient education      Lumbar Exercises: Quadruped   Madcat/Old Horse 10 reps    Straight Leg Raise 10 reps    Straight Leg Raises Limitations x2    Other Quadruped Lumbar Exercises fire hydrant 2 x 10      Knee/Hip Exercises: Sidelying   Clams 2 x 10 blue band      Knee/Hip Exercises: Prone   Hip Extension 10 reps    Hip Extension Limitations x2; bilateral      Manual Therapy   Manual therapy comments STM to bilateral posterior gluteal musculature                     PT Education - 08/13/21 1704     Education Details Issue tennis ball for self soft tissue mobilization.Updated HEP    Person(s) Educated Patient    Methods Explanation;Demonstration;Verbal cues;Handout    Comprehension Verbalized understanding;Returned demonstration;Verbal cues required              PT Short Term Goals - 08/13/21 1649       PT SHORT TERM GOAL #1   Title Patient will be independent with initial home program.    Baseline Issued at eval.     Status Achieved    Target Date 08/13/21      PT SHORT TERM GOAL #2   Title Patient will demonstrate no Gower sign when returning to standing from trunk flexed position    Baseline Gower sign    Status Achieved    Target Date 08/20/21      PT SHORT TERM GOAL #3   Title Patient will report tolerating at least 15 minutes of standing activity.    Baseline 5 minutes    Status On-going    Target Date 08/20/21      PT SHORT TERM GOAL #4   Title Patient will demonstrate proper pelvic alignment maintained between session.    Baseline Lt posterior rotated innominate    Status Achieved    Target Date 08/20/21               PT Long Term Goals - 07/30/21 1346       PT LONG TERM GOAL #1   Title Patient will be independent with advanced home program to assist in management of her chronic condition.    Baseline n/a    Status New    Target Date 09/10/21      PT LONG TERM GOAL #2   Title Patient will demonstrate at least 4+/5 bilateral hip strength to improve stability about the chain with prolonged standing and walking activity.    Baseline see flowsheet    Status New    Target Date 09/10/21      PT LONG TERM GOAL #3   Title Patient will score at least 60% function on FOTO to signify clinically meaningful improvement in functional abilities.    Baseline 50%    Status New    Target Date 09/10/21      PT LONG TERM GOAL #4   Title Patient will report pain at worst as 5/10 indicative of improvement in her overall condition.    Baseline 8/10    Status New    Target Date 09/10/21      PT LONG TERM GOAL #5   Title Patient will demonstrate proper bending and lifting mechanics of light weight to improve ability to lift and carry laundry at home.  Baseline unable    Status New    Target Date 09/10/21                   Plan - 08/13/21 1650     Clinical Impression Statement April [name] arrives with symmetrical pelvic alignment having met this short term functional goal. She  has full lumbar flexion and no gower's sign present when she returns to neutral, though continues to report pain with ascent. Able to progress core and hip strengthening without reports of increased pain. She demonstrates good lumbopelvic stability with quadruped strengthening.    PT Next Visit Plan assess response to HEP, check hip levels, begin core strengthening, hamstring/quad/piriformis stretching, assess for directional preference, sciatic nerve glides; consider prone over pillows    PT Home Exercise Plan Access Code: [email or web address]             Patient will benefit from skilled therapeutic intervention in order to improve the following deficits and impairments:  Pain, Impaired flexibility, Improper body mechanics, Postural dysfunction, Decreased strength  Visit Diagnosis: Chronic bilateral low back pain without sciatica  Muscle weakness (generalized)     Problem List Patient Active Problem List   Diagnosis Date Noted   Low back pain 05/31/2021   Elevated LFTs 05/30/2021   Thrombocytosis 05/30/2021   Encounter for general adult medical examination with abnormal findings 05/30/2021   Abscess of skin of abdomen 05/30/2021   Psychophysiological insomnia 05/30/2021   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   Hypertension 12/14/2019   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 [place], [name], [address] Phone: [phone]   Fax:  [phone]  Name: April [name] MRN:  [id number] Date of Birth: [birth year]

## 2021-08-16 ENCOUNTER — Ambulatory Visit: Payer: 59

## 2021-08-16 ENCOUNTER — Other Ambulatory Visit: Payer: Self-pay

## 2021-08-16 DIAGNOSIS — M6281 Muscle weakness (generalized): Secondary | ICD-10-CM

## 2021-08-16 DIAGNOSIS — G8929 Other chronic pain: Secondary | ICD-10-CM | POA: Diagnosis not present

## 2021-08-16 DIAGNOSIS — M545 Low back pain, unspecified: Secondary | ICD-10-CM

## 2021-08-16 NOTE — Therapy (Signed)
[place], [name], [address] Phone: [phone]   Fax:  [phone]  Physical Therapy Treatment  Patient Details  Name: April [name] MRN: [id number] Date of Birth: 08-18-1976 Referring Provider (PT): Lyndal Pulley, DO   Encounter Date: 08/16/2021   PT End of Session - 08/16/21 1842     Visit Number 5    Number of Visits 13    Date for PT Re-Evaluation 09/15/21    Authorization Type UMR    PT Start Time 1832    PT Stop Time 1913    PT Time Calculation (min) 41 min    Activity Tolerance Patient tolerated treatment well    Behavior During Therapy WFL for tasks assessed/performed             Past Medical History:  Diagnosis Date   ALLERGIC RHINITIS    Anal fissure    Anxiety    ANXIETY DEPRESSION    Back pain    CONSTIPATION    Constipation    DEPRESSION    Esophageal reflux    Food allergy    Avacados   Gastritis    GERD (gastroesophageal reflux disease)    HYPERCHOLESTEROLEMIA    Hyperlipidemia    Hypertension    OBESITY    Occipital neuralgia    Palpitations    Rheumatoid arthritis ([place]) 12/24/2017    Past Surgical History:  Procedure Laterality Date   NO PAST SURGERIES      There were no vitals filed for this visit.   Subjective Assessment - 08/16/21 1840     Subjective Patient reports it's not a good day. She is having pain all over and thinks it's because of the weather.    Currently in Pain? Yes    Pain Score 8     Pain Location Generalized    Pain Descriptors / Indicators Aching    Pain Type Chronic pain    Pain Onset More than a month ago    Pain Frequency Intermittent    Aggravating Factors  weather, initial movement (initial step, transitioning from sit to stand)    Pain Relieving Factors sitting, movement (walking)                               [facility] Adult PT Treatment/Exercise - 08/16/21 0001       Lumbar Exercises:  Stretches   Other Lumbar Stretch Exercise 3 way stability ball rollout 2 minutes    Other Lumbar Stretch Exercise sidelying open book 1 x 10 bilateral      Lumbar Exercises: Seated   Other Seated Lumbar Exercises seated march on stability ball 2 x 10      Lumbar Exercises: Supine   Dead Bug 10 reps    Dead Bug Limitations LE only; x 2    Other Supine Lumbar Exercises modified 100s 3 x 30      Lumbar Exercises: Quadruped   Madcat/Old Horse 10 reps      Knee/Hip Exercises: Sidelying   Other Sidelying Knee/Hip Exercises sidelying hip circles CW/CCW 2  x10 bilateral                       PT Short Term Goals - 08/13/21 1649       PT SHORT TERM GOAL #1   Title Patient will be independent with initial home program.    Baseline Issued at eval.  Status Achieved    Target Date 08/13/21      PT SHORT TERM GOAL #2   Title Patient will demonstrate no Gower sign when returning to standing from trunk flexed position    Baseline Gower sign    Status Achieved    Target Date 08/20/21      PT SHORT TERM GOAL #3   Title Patient will report tolerating at least 15 minutes of standing activity.    Baseline 5 minutes    Status On-going    Target Date 08/20/21      PT SHORT TERM GOAL #4   Title Patient will demonstrate proper pelvic alignment maintained between session.    Baseline Lt posterior rotated innominate    Status Achieved    Target Date 08/20/21               PT Long Term Goals - 07/30/21 1346       PT LONG TERM GOAL #1   Title Patient will be independent with advanced home program to assist in management of her chronic condition.    Baseline n/a    Status New    Target Date 09/10/21      PT LONG TERM GOAL #2   Title Patient will demonstrate at least 4+/5 bilateral hip strength to improve stability about the chain with prolonged standing and walking activity.    Baseline see flowsheet    Status New    Target Date 09/10/21      PT LONG TERM GOAL #3    Title Patient will score at least 60% function on FOTO to signify clinically meaningful improvement in functional abilities.    Baseline 50%    Status New    Target Date 09/10/21      PT LONG TERM GOAL #4   Title Patient will report pain at worst as 5/10 indicative of improvement in her overall condition.    Baseline 8/10    Status New    Target Date 09/10/21      PT LONG TERM GOAL #5   Title Patient will demonstrate proper bending and lifting mechanics of light weight to improve ability to lift and carry laundry at home.    Baseline unable    Status New    Target Date 09/10/21                   Plan - 08/16/21 1848     Clinical Impression Statement Patient arrives with 8/10 generalized pain in her back, hips, neck, and shoulders that is likely related to her RA. Session focused on pain reduction utilizing trunk mobility at beginning of session with patient reporting a reduction in pain. Able to then progress core stabilization with patient quickly fatiguing during core stabilization in tabletop positioning. She reported no pain at the end of the session.    PT Treatment/Interventions ADLs/Self Care Home Management;Cryotherapy;Electrical Stimulation;Iontophoresis 4mg /ml Dexamethasone;Moist Heat;Traction;Therapeutic activities;Therapeutic exercise;Gait training;Stair training;Balance training;Neuromuscular re-education;Patient/family education;Manual techniques;Passive range of motion;Dry needling;Taping;Spinal Manipulations    PT Next Visit Plan assess response to HEP, check hip levels, begin core strengthening, hamstring/quad/piriformis stretching, assess for directional preference, sciatic nerve glides; consider prone over pillows    PT Home Exercise Plan Access Code: [email or web address]             Patient will benefit from skilled therapeutic intervention in order to improve the following deficits and impairments:  Pain, Impaired flexibility, Improper body mechanics, Postural  dysfunction, Decreased strength  Visit Diagnosis: Chronic bilateral low back  pain without sciatica  Muscle weakness (generalized)     Problem List Patient Active Problem List   Diagnosis Date Noted   Low back pain 05/31/2021   Elevated LFTs 05/30/2021   Thrombocytosis 05/30/2021   Encounter for general adult medical examination with abnormal findings 05/30/2021   Abscess of skin of abdomen 05/30/2021   Psychophysiological insomnia 05/30/2021   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   Hypertension 12/14/2019   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 04/19/2018   Allergic rhinitis due to animal hair and dander 02/20/2018   Rheumatoid arthritis ([place]) 12/24/2017   Chronic sinusitis 01/07/2017   Major depressive disorder, recurrent episode, moderate ([place]) 12/02/2014   Hyperlipidemia 11/16/2008   Obesity 11/16/2008   ANXIETY DEPRESSION 11/16/2008   Esophageal reflux 11/16/2008   Constipation 11/16/2008  Gwendolyn Grant, PT, DPT, ATC 08/16/21 7:15 PM   [facility] [address] [place], [name], [address] Phone: [phone]   Fax:  [phone]  Name: April [name] MRN: [id number] Date of Birth: January 08, 1976

## 2021-08-20 ENCOUNTER — Other Ambulatory Visit (HOSPITAL_COMMUNITY): Payer: Self-pay

## 2021-08-20 ENCOUNTER — Other Ambulatory Visit: Payer: Self-pay

## 2021-08-20 ENCOUNTER — Other Ambulatory Visit: Payer: Self-pay | Admitting: Internal Medicine

## 2021-08-20 ENCOUNTER — Ambulatory Visit: Payer: 59 | Attending: Family Medicine

## 2021-08-20 DIAGNOSIS — M545 Low back pain, unspecified: Secondary | ICD-10-CM | POA: Insufficient documentation

## 2021-08-20 DIAGNOSIS — G8929 Other chronic pain: Secondary | ICD-10-CM | POA: Insufficient documentation

## 2021-08-20 DIAGNOSIS — M6281 Muscle weakness (generalized): Secondary | ICD-10-CM | POA: Diagnosis not present

## 2021-08-20 DIAGNOSIS — G43719 Chronic migraine without aura, intractable, without status migrainosus: Secondary | ICD-10-CM | POA: Diagnosis not present

## 2021-08-20 DIAGNOSIS — R519 Headache, unspecified: Secondary | ICD-10-CM | POA: Diagnosis not present

## 2021-08-20 MED ORDER — MONTELUKAST SODIUM 10 MG PO TABS
10.0000 mg | ORAL_TABLET | Freq: Every day | ORAL | 1 refills | Status: DC
  Filled 2021-08-20: qty 90, 90d supply, fill #0
  Filled 2021-11-15: qty 90, 90d supply, fill #1

## 2021-08-20 MED ORDER — GABAPENTIN 300 MG PO CAPS
300.0000 mg | ORAL_CAPSULE | Freq: Every day | ORAL | 1 refills | Status: DC
  Filled 2021-08-20 – 2021-10-01 (×2): qty 90, 90d supply, fill #0
  Filled 2022-01-14: qty 90, 90d supply, fill #1

## 2021-08-20 NOTE — Patient Instructions (Signed)

## 2021-08-20 NOTE — Therapy (Signed)
[place], [name], [address] Phone: [phone]   Fax:  [phone]  Physical Therapy Treatment  Patient Details  Name: April [name] MRN: [id number] Date of Birth: May 02, 1976 Referring Provider (PT): Lyndal Pulley, DO   Encounter Date: 08/20/2021   PT End of Session - 08/20/21 1143     Visit Number 6    Number of Visits 13    Date for PT Re-Evaluation 09/15/21    Authorization Type UMR    PT Start Time 1145    PT Stop Time 1230    PT Time Calculation (min) 45 min    Activity Tolerance Patient tolerated treatment well    Behavior During Therapy WFL for tasks assessed/performed             Past Medical History:  Diagnosis Date   ALLERGIC RHINITIS    Anal fissure    Anxiety    ANXIETY DEPRESSION    Back pain    CONSTIPATION    Constipation    DEPRESSION    Esophageal reflux    Food allergy    Avacados   Gastritis    GERD (gastroesophageal reflux disease)    HYPERCHOLESTEROLEMIA    Hyperlipidemia    Hypertension    OBESITY    Occipital neuralgia    Palpitations    Rheumatoid arthritis ([place]) 12/24/2017    Past Surgical History:  Procedure Laterality Date   NO PAST SURGERIES      There were no vitals filed for this visit.   Subjective Assessment - 08/20/21 1144     Subjective "Just a little achy today, but not like last week."    Currently in Pain? Yes    Pain Score 6     Pain Location Sacrum    Pain Orientation Posterior    Pain Descriptors / Indicators Aching    Pain Type Chronic pain    Pain Onset More than a month ago    Pain Frequency Intermittent    Aggravating Factors  lifting    Pain Relieving Factors exercises, rest                [facility] PT Assessment - 08/20/21 0001       Observation/Other Assessments   Focus on Therapeutic Outcomes (FOTO)  46% function      Functional Tests   Functional tests Squat   lifting: limited hip/knee flexion,  excessive trunk flexion     Squat   Comments limited squat depth, reports weakness in quads with squatting                           [facility] Adult PT Treatment/Exercise - 08/20/21 0001       Self-Care   Other Self-Care Comments  see patient education      Lumbar Exercises: Stretches   Piriformis Stretch 30 seconds    Piriformis Stretch Limitations x2; bilateral      Lumbar Exercises: Seated   Other Seated Lumbar Exercises seated hip hinge 1 x 10 with dowel      Knee/Hip Exercises: Aerobic   Nustep 5 minutes level 6 UE/LE      Knee/Hip Exercises: Machines for Strengthening   Cybex Leg Press 2  x10 @ 40 lbs      Knee/Hip Exercises: Standing   Other Standing Knee Exercises standing hip hinge with dowel 1 x 5; increaesd pain  PT Education - 08/20/21 1152     Education Details Reviewed FOTO score and anticipated progress. Discussed and demonstrated appropriate lifting and bending mechanics, appropriate posture, and sleep positioning recommendations.    Person(s) Educated Patient    Methods Explanation;Demonstration;Verbal cues;Handout    Comprehension Verbalized understanding;Returned demonstration;Verbal cues required              PT Short Term Goals - 08/13/21 1649       PT SHORT TERM GOAL #1   Title Patient will be independent with initial home program.    Baseline Issued at eval.    Status Achieved    Target Date 08/13/21      PT SHORT TERM GOAL #2   Title Patient will demonstrate no Gower sign when returning to standing from trunk flexed position    Baseline Gower sign    Status Achieved    Target Date 08/20/21      PT SHORT TERM GOAL #3   Title Patient will report tolerating at least 15 minutes of standing activity.    Baseline 5 minutes    Status On-going    Target Date 08/20/21      PT SHORT TERM GOAL #4   Title Patient will demonstrate proper pelvic alignment maintained between session.    Baseline Lt  posterior rotated innominate    Status Achieved    Target Date 08/20/21               PT Long Term Goals - 07/30/21 1346       PT LONG TERM GOAL #1   Title Patient will be independent with advanced home program to assist in management of her chronic condition.    Baseline n/a    Status New    Target Date 09/10/21      PT LONG TERM GOAL #2   Title Patient will demonstrate at least 4+/5 bilateral hip strength to improve stability about the chain with prolonged standing and walking activity.    Baseline see flowsheet    Status New    Target Date 09/10/21      PT LONG TERM GOAL #3   Title Patient will score at least 60% function on FOTO to signify clinically meaningful improvement in functional abilities.    Baseline 50%    Status New    Target Date 09/10/21      PT LONG TERM GOAL #4   Title Patient will report pain at worst as 5/10 indicative of improvement in her overall condition.    Baseline 8/10    Status New    Target Date 09/10/21      PT LONG TERM GOAL #5   Title Patient will demonstrate proper bending and lifting mechanics of light weight to improve ability to lift and carry laundry at home.    Baseline unable    Status New    Target Date 09/10/21                   Plan - 08/20/21 1153     Clinical Impression Statement Patient's FOTO score has slightly regressed compared to baseline and when discussing results she reports there has been no change in her pain as it relates to lifting and bending since the start of care. When patient demonstrates lifting she has excessive trunk flexion and limited hip/knee flexion. When she demonstrates squatting she has limited squat depth and feels that weakness in her legs is what keeps her from squatting further. Session began addressing  her aberrant lifting and squatting mechanics today. She is able to complete seated hip hinge through partial range without pain, though when attempts in standing she has immediate  increase in pain. She quickly fatigues with light resistance on leg press, though no increased pain reported.    PT Treatment/Interventions ADLs/Self Care Home Management;Cryotherapy;Electrical Stimulation;Iontophoresis 4mg /ml Dexamethasone;Moist Heat;Traction;Therapeutic activities;Therapeutic exercise;Gait training;Stair training;Balance training;Neuromuscular re-education;Patient/family education;Manual techniques;Passive range of motion;Dry needling;Taping;Spinal Manipulations    PT Next Visit Plan wall squat, hinge, leg press    PT Home Exercise Plan Access Code: [email or web address]             Patient will benefit from skilled therapeutic intervention in order to improve the following deficits and impairments:  Pain, Impaired flexibility, Improper body mechanics, Postural dysfunction, Decreased strength  Visit Diagnosis: Chronic bilateral low back pain without sciatica  Muscle weakness (generalized)     Problem List Patient Active Problem List   Diagnosis Date Noted   Low back pain 05/31/2021   Elevated LFTs 05/30/2021   Thrombocytosis 05/30/2021   Encounter for general adult medical examination with abnormal findings 05/30/2021   Abscess of skin of abdomen 05/30/2021   Psychophysiological insomnia 05/30/2021   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   Hypertension 12/14/2019   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 [place], [name], [address] Phone: [phone]   Fax:   [phone]  Name: NISSI DOFFING MRN: [id number] Date of Birth: 1976-11-15

## 2021-08-21 ENCOUNTER — Ambulatory Visit (INDEPENDENT_AMBULATORY_CARE_PROVIDER_SITE_OTHER): Payer: 59 | Admitting: Family Medicine

## 2021-08-21 ENCOUNTER — Encounter (INDEPENDENT_AMBULATORY_CARE_PROVIDER_SITE_OTHER): Payer: Self-pay | Admitting: Family Medicine

## 2021-08-21 VITALS — BP 127/77 | HR 82 | Temp 97.7°F | Ht 70.0 in | Wt 249.0 lb

## 2021-08-21 DIAGNOSIS — Z9189 Other specified personal risk factors, not elsewhere classified: Secondary | ICD-10-CM

## 2021-08-21 DIAGNOSIS — F39 Unspecified mood [affective] disorder: Secondary | ICD-10-CM | POA: Diagnosis not present

## 2021-08-21 DIAGNOSIS — Z6837 Body mass index (BMI) 37.0-37.9, adult: Secondary | ICD-10-CM

## 2021-08-21 DIAGNOSIS — I10 Essential (primary) hypertension: Secondary | ICD-10-CM

## 2021-08-21 DIAGNOSIS — R7303 Prediabetes: Secondary | ICD-10-CM | POA: Diagnosis not present

## 2021-08-21 DIAGNOSIS — E66812 Obesity, class 2: Secondary | ICD-10-CM

## 2021-08-22 ENCOUNTER — Ambulatory Visit: Payer: 59

## 2021-08-22 ENCOUNTER — Other Ambulatory Visit (HOSPITAL_COMMUNITY): Payer: Self-pay

## 2021-08-22 ENCOUNTER — Other Ambulatory Visit: Payer: Self-pay

## 2021-08-22 DIAGNOSIS — G8929 Other chronic pain: Secondary | ICD-10-CM

## 2021-08-22 DIAGNOSIS — M6281 Muscle weakness (generalized): Secondary | ICD-10-CM | POA: Diagnosis not present

## 2021-08-22 DIAGNOSIS — M545 Low back pain, unspecified: Secondary | ICD-10-CM

## 2021-08-22 MED ORDER — CARESTART COVID-19 HOME TEST VI KIT
PACK | 0 refills | Status: DC
Start: 2021-08-22 — End: 2021-10-30
  Filled 2021-08-22: qty 2, 2d supply, fill #0

## 2021-08-22 NOTE — Therapy (Signed)
[place], [name], [address] Phone: [phone]   Fax:  [phone]  Physical Therapy Treatment  Patient Details  Name: April [name] MRN: [id number] Date of Birth: [birth year] Referring Provider (PT): Lyndal Pulley, DO   Encounter Date: 08/22/2021   PT End of Session - 08/22/21 1816     Visit Number 7    Number of Visits 13    Date for PT Re-Evaluation 09/15/21    Authorization Type UMR    PT Start Time 1815    PT Stop Time 1856    PT Time Calculation (min) 41 min    Activity Tolerance Patient tolerated treatment well    Behavior During Therapy WFL for tasks assessed/performed             Past Medical History:  Diagnosis Date   ALLERGIC RHINITIS    Anal fissure    Anxiety    ANXIETY DEPRESSION    Back pain    CONSTIPATION    Constipation    DEPRESSION    Esophageal reflux    Food allergy    Avacados   Gastritis    GERD (gastroesophageal reflux disease)    HYPERCHOLESTEROLEMIA    Hyperlipidemia    Hypertension    OBESITY    Occipital neuralgia    Palpitations    Rheumatoid arthritis ([place]) 12/24/2017    Past Surgical History:  Procedure Laterality Date   NO PAST SURGERIES      There were no vitals filed for this visit.   Subjective Assessment - 08/22/21 1817     Subjective "I'm feeling really good actually."    Currently in Pain? No/denies                               [facility] Adult PT Treatment/Exercise - 08/22/21 0001       Self-Care   Other Self-Care Comments  see patient education      Knee/Hip Exercises: Aerobic   Nustep 5 minutes level 6 UE/LE      Knee/Hip Exercises: Machines for Strengthening   Cybex Leg Press 2 x 15 @ 40 lbs    Hip Cybex 2 x 10 hip flexion 12.5 lbs      Knee/Hip Exercises: Standing   Forward Step Up Limitations runner's step up 2 x 10 bilateral 4 inch step    Other Standing Knee Exercises 3 way hip airex 1 x  10 bilateral    Other Standing Knee Exercises wall squat 2 x 10                     PT Education - 08/22/21 1847     Education Details updated HEP    Person(s) Educated Patient    Methods Explanation;Demonstration;Verbal cues;Handout    Comprehension Verbalized understanding;Returned demonstration;Verbal cues required              PT Short Term Goals - 08/22/21 1825       PT SHORT TERM GOAL #1   Title Patient will be independent with initial home program.    Baseline Issued at eval.    Status Achieved    Target Date 08/13/21      PT SHORT TERM GOAL #2   Title Patient will demonstrate no Gower sign when returning to standing from trunk flexed position    Baseline Gower sign    Status Achieved    Target  Date 08/20/21      PT SHORT TERM GOAL #3   Title Patient will report tolerating at least 15 minutes of standing activity.    Baseline 5 minutes; 10 minutes 10/5    Status On-going    Target Date 08/20/21      PT SHORT TERM GOAL #4   Title Patient will demonstrate proper pelvic alignment maintained between session.    Baseline Lt posterior rotated innominate    Status Achieved    Target Date 08/20/21               PT Long Term Goals - 07/30/21 1346       PT LONG TERM GOAL #1   Title Patient will be independent with advanced home program to assist in management of her chronic condition.    Baseline n/a    Status New    Target Date 09/10/21      PT LONG TERM GOAL #2   Title Patient will demonstrate at least 4+/5 bilateral hip strength to improve stability about the chain with prolonged standing and walking activity.    Baseline see flowsheet    Status New    Target Date 09/10/21      PT LONG TERM GOAL #3   Title Patient will score at least 60% function on FOTO to signify clinically meaningful improvement in functional abilities.    Baseline 50%    Status New    Target Date 09/10/21      PT LONG TERM GOAL #4   Title Patient will report  pain at worst as 5/10 indicative of improvement in her overall condition.    Baseline 8/10    Status New    Target Date 09/10/21      PT LONG TERM GOAL #5   Title Patient will demonstrate proper bending and lifting mechanics of light weight to improve ability to lift and carry laundry at home.    Baseline unable    Status New    Target Date 09/10/21                   Plan - 08/22/21 1824     Clinical Impression Statement Patient arrives without reports of pain. Continued to progress hip and core strengthening, which she tolerated well without reports of back pain. With resisted hip flexion she reports no back pain, but complains of anterior hip pain at end range of flexion on the LLE. She was instructed to reduce range, which abolished her pain. She demonstrates excellent lumbopelvic stability with standing strengthening today.    PT Treatment/Interventions ADLs/Self Care Home Management;Cryotherapy;Electrical Stimulation;Iontophoresis 4mg /ml Dexamethasone;Moist Heat;Traction;Therapeutic activities;Therapeutic exercise;Gait training;Stair training;Balance training;Neuromuscular re-education;Patient/family education;Manual techniques;Passive range of motion;Dry needling;Taping;Spinal Manipulations    PT Next Visit Plan continue with standing strengthening focusing on core and hips, progress CKC strengthening as tolerated    PT Home Exercise Plan Access Code: [email or web address]    Consulted and Agree with Plan of Care Patient             Patient will benefit from skilled therapeutic intervention in order to improve the following deficits and impairments:  Pain, Impaired flexibility, Improper body mechanics, Postural dysfunction, Decreased strength  Visit Diagnosis: Chronic bilateral low back pain without sciatica  Muscle weakness (generalized)     Problem List Patient Active Problem List   Diagnosis Date Noted   Low back pain 05/31/2021   Elevated LFTs 05/30/2021    Thrombocytosis 05/30/2021   Encounter for general adult medical examination with  abnormal findings 05/30/2021   Abscess of skin of abdomen 05/30/2021   Psychophysiological insomnia 05/30/2021   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   Hypertension 12/14/2019   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 [place], [name], [address] Phone: [phone]   Fax:  [phone]  Name: April [name] MRN: [id number] Date of Birth: 04/12/1976

## 2021-08-22 NOTE — Progress Notes (Signed)
Chief Complaint:   OBESITY Noah is here to discuss her progress with her obesity treatment plan along with follow-up of her obesity related diagnoses. Ottilia is on keeping a food journal and adhering to recommended goals of 1300-1400 calories and 90 grams protein and states she is following her eating plan approximately 75% of the time. Annelise states she is walking on treadmill 20 minutes 2 times per week.  Today's visit was #: 6 Starting weight: 263 lbs Starting date: 05/17/2021 Today's weight: 249 lbs Today's date: 08/21/2021 Total lbs lost to date: 14 Total lbs lost since last in-office visit: 8  Interim History: Andra changed to journaling at last OV. She loved it and said because she is type A, it's good for her to see exactly what the foods have in them and how it's affecting her body.  Subjective:   1. Pre-diabetes Jaeda has had a lot less sugar and candy cravings.  2. Mood disorder ([place]) with emotional eating Karlin's mood is stable and anxiety well controlled. She is feeling better about herself with journaling and feels more in control. Medication: Buspar, Lexapro  3. Essential hypertension BP at goal. Medication: Cozaar  BP Readings from Last 3 Encounters:  08/21/21 127/77  08/06/21 128/68  07/12/21 120/82   Lab Results  Component Value Date   CREATININE 0.77 05/17/2021   CREATININE 0.87 05/26/2020   CREATININE 0.76 12/15/2018   4. At risk for dehydration Carolie is at risk for dehydration due to inadequate water intake.  Assessment/Plan:  No orders of the defined types were placed in this encounter.   Medications Discontinued During This Encounter  Medication Reason   clindamycin (CLINDAGEL) 1 % gel Error     No orders of the defined types were placed in this encounter.    1. Pre-diabetes Onisha will continue to work on weight loss, exercise, and decreasing simple carbohydrates to help decrease the risk of diabetes. No need for  medication at this time.  2. Mood disorder ([place]) with emotional eating Continue medications, increase exercise, and continue prudent nutritional plan  3. Essential hypertension Jereline is working on healthy weight loss and exercise to improve blood pressure control. We will watch for signs of hypotension as she continues her lifestyle modifications.  4. At risk for dehydration Zollie was given approximately 9 minutes dehydration prevention counseling today. Oluwanifemi is at risk for dehydration due to weight loss and current medication(s). She was encouraged to hydrate and monitor fluid status to avoid dehydration as well as weight loss plateaus.    5. Obesity with current BMI of 35.8  Hether is currently in the action stage of change. As such, her goal is to continue with weight loss efforts. She has agreed to keeping a food journal and adhering to recommended goals of 1300-1400 calories and 90+ grams protein.   Bring log to next OV.  Exercise goals:  Increase as tolerated to 20 minutes 3 days a week.  Behavioral modification strategies: increasing lean protein intake, decreasing simple carbohydrates, and keeping a strict food journal.  Veora has agreed to follow-up with our clinic in 2 weeks. She was informed of the importance of frequent follow-up visits to maximize her success with intensive lifestyle modifications for her multiple health conditions.   Objective:   Blood pressure 127/77, pulse 82, temperature 97.7 F (36.5 C), height 5\' 10"  (1.778 m), weight 249 lb (112.9 kg), SpO2 99 %. Body mass index is 35.73 kg/m.  General: Cooperative, alert, well developed, in  no acute distress. HEENT: Conjunctivae and lids unremarkable. Cardiovascular: Regular rhythm.  Lungs: Normal work of breathing. Neurologic: No focal deficits.   Lab Results  Component Value Date   CREATININE 0.77 05/17/2021   BUN 11 05/17/2021   NA 140 05/17/2021   K 4.8 05/17/2021   CL 103 05/17/2021   CO2  23 05/17/2021   Lab Results  Component Value Date   ALT 33 (H) 05/17/2021   AST 27 05/17/2021   ALKPHOS 96 05/17/2021   BILITOT 0.3 05/17/2021   Lab Results  Component Value Date   HGBA1C 5.7 (H) 05/17/2021   HGBA1C 5.4 05/26/2020   HGBA1C 5.5 05/26/2019   HGBA1C 5.6 05/22/2018   HGBA1C 5.8 05/06/2017   Lab Results  Component Value Date   INSULIN 16.3 05/17/2021   Lab Results  Component Value Date   TSH 0.955 05/17/2021   Lab Results  Component Value Date   CHOL 225 (A) 05/11/2021   HDL 66 05/11/2021   LDLCALC 128 05/11/2021   LDLDIRECT 96.0 05/26/2019   TRIG 179 (A) 05/11/2021   CHOLHDL 3.1 05/26/2020   Lab Results  Component Value Date   VD25OH 75.2 05/17/2021   VD25OH 59 05/26/2020   VD25OH 41.41 05/06/2017   Lab Results  Component Value Date   WBC 5.4 05/17/2021   HGB 12.6 05/17/2021   HCT 39.9 05/17/2021   MCV 92 05/17/2021   PLT 418 (A) 05/11/2021   Lab Results  Component Value Date   IRON 82 05/30/2021   FERRITIN 290.9 05/30/2021    Attestation Statements:   Reviewed by clinician on day of visit: allergies, medications, problem list, medical history, surgical history, family history, social history, and previous encounter notes.  Coral Ceo, CMA, am acting as transcriptionist for [employer], DO.  I have reviewed the above documentation for accuracy and completeness, and I agree with the above. Marjory Sneddon, D.O.  The [place] was signed into law in 2016 which includes the topic of electronic health records.  This provides immediate access to information in MyChart.  This includes consultation notes, operative notes, office notes, lab results and pathology reports.  If you have any questions about what you read please let us know at your next visit so we can discuss your concerns and take corrective action if need be.  We are right here with you.

## 2021-08-22 NOTE — Progress Notes (Signed)
April [name] [place] [address] [place] Phone: [phone] Subjective:   [name], am serving as a scribe for Dr. Hulan Saas. This visit occurred during the SARS-CoV-2 public health emergency.  Safety protocols were in place, including screening questions prior to the visit, additional usage of staff PPE, and extensive cleaning of exam room while observing appropriate contact time as indicated for disinfecting solutions.   I'm seeing this patient by the request  of:  Janith Lima, MD  CC: Neck and low back pain follow-up  WPY:[id number]  April [name] is a 45 y.o. female coming in with complaint of back and neck pain. OMT on 07/12/2021. Patient states pain is getting better, but not 100 percent. On week 2 of physical therapy.  Medications patient has been prescribed: Advil  Taking: Intermittently         Reviewed prior external information including notes and imaging from previsou exam, outside providers and external EMR if available.   As well as notes that were available from care everywhere and other healthcare systems.  Past medical history, social, surgical and family history all reviewed in electronic medical record.  No pertanent information unless stated regarding to the chief complaint.   Past Medical History:  Diagnosis Date   ALLERGIC RHINITIS    Anal fissure    Anxiety    ANXIETY DEPRESSION    Back pain    CONSTIPATION    Constipation    DEPRESSION    Esophageal reflux    Food allergy    Avacados   Gastritis    GERD (gastroesophageal reflux disease)    HYPERCHOLESTEROLEMIA    Hyperlipidemia    Hypertension    OBESITY    Occipital neuralgia    Palpitations    Rheumatoid arthritis ([place]) 12/24/2017    Allergies  Allergen Reactions   Ciprofloxacin     toxicity   Levaquin [Levofloxacin] Anxiety    Possible CNS side effects. TOXICITY   Moxifloxacin     TOXICITY     Review of Systems:  No  headache, visual changes, nausea, vomiting, diarrhea, constipation, dizziness, abdominal pain, skin rash, fevers, chills, night sweats, weight loss, swollen lymph nodes, body aches, joint swelling, chest pain, shortness of breath, mood changes. POSITIVE muscle aches  Objective  Blood pressure 122/74, pulse 77, height 5\' 10"  (1.778 m), weight 255 lb (115.7 kg), SpO2 98 %.   General: No apparent distress alert and oriented x3 mood and affect normal, dressed appropriately.  HEENT: Pupils equal, extraocular movements intact  Respiratory: Patient's speak in full sentences and does not appear short of breath  Cardiovascular: No lower extremity edema, non tender, no erythema  Still having significant discomfort noted over the sacroiliac joints bilaterally.  Tightness with FABER test.  Patient does have tightness also in the hamstrings bilaterally.  Osteopathic findings  C7 flexed rotated and side bent left T3 extended rotated and side bent right inhaled rib T9 extended rotated and side bent left L2 flexed rotated and side bent right Sacrum right on right Pelvic shear noted      Assessment and Plan:  SI (sacroiliac) joint dysfunction Continues to give patient chronic problems.  Seems to be a chronic problem with continued exacerbation.  Patient is continuing to do the home exercises and the core strengthening and is making some progress with the formal physical therapy.  Discussed icing regimen.  Patient is going to continue to use anti-inflammatories as needed.  Has taken Celebrex  in the past as well.  Follow-up with me again 6 weeks   Nonallopathic problems  Decision today to treat with OMT was based on Physical Exam  After verbal consent patient was treated with HVLA, ME, FPR techniques in cervical, rib, thoracic, lumbar, and sacral and pelvis areas avoided HVLA in the neck  Patient tolerated the procedure well with improvement in symptoms  Patient given exercises, stretches and  lifestyle modifications  See medications in patient instructions if given  Patient will follow up in 4-8 weeks      The above documentation has been reviewed and is accurate and complete Lyndal Pulley, DO       Note: This dictation was prepared with Dragon dictation along with smaller phrase technology. Any transcriptional errors that result from this process are unintentional.

## 2021-08-23 ENCOUNTER — Other Ambulatory Visit (HOSPITAL_COMMUNITY): Payer: Self-pay

## 2021-08-23 ENCOUNTER — Ambulatory Visit: Payer: 59 | Admitting: Family Medicine

## 2021-08-23 ENCOUNTER — Encounter: Payer: Self-pay | Admitting: Family Medicine

## 2021-08-23 VITALS — BP 122/74 | HR 77 | Ht 70.0 in | Wt 255.0 lb

## 2021-08-23 DIAGNOSIS — M533 Sacrococcygeal disorders, not elsewhere classified: Secondary | ICD-10-CM | POA: Diagnosis not present

## 2021-08-23 DIAGNOSIS — M9908 Segmental and somatic dysfunction of rib cage: Secondary | ICD-10-CM | POA: Diagnosis not present

## 2021-08-23 DIAGNOSIS — M9903 Segmental and somatic dysfunction of lumbar region: Secondary | ICD-10-CM

## 2021-08-23 DIAGNOSIS — M9904 Segmental and somatic dysfunction of sacral region: Secondary | ICD-10-CM | POA: Diagnosis not present

## 2021-08-23 DIAGNOSIS — M9902 Segmental and somatic dysfunction of thoracic region: Secondary | ICD-10-CM

## 2021-08-23 DIAGNOSIS — M9901 Segmental and somatic dysfunction of cervical region: Secondary | ICD-10-CM | POA: Diagnosis not present

## 2021-08-23 DIAGNOSIS — M9905 Segmental and somatic dysfunction of pelvic region: Secondary | ICD-10-CM | POA: Diagnosis not present

## 2021-08-23 NOTE — Assessment & Plan Note (Signed)
Continues to give patient chronic problems.  Seems to be a chronic problem with continued exacerbation.  Patient is continuing to do the home exercises and the core strengthening and is making some progress with the formal physical therapy.  Discussed icing regimen.  Patient is going to continue to use anti-inflammatories as needed.  Has taken Celebrex in the past as well.  Follow-up with me again 6 weeks

## 2021-08-23 NOTE — Patient Instructions (Signed)
Keep working with PT See me in 6 weeks

## 2021-08-24 ENCOUNTER — Other Ambulatory Visit (HOSPITAL_COMMUNITY): Payer: Self-pay

## 2021-08-27 ENCOUNTER — Other Ambulatory Visit: Payer: Self-pay

## 2021-08-27 ENCOUNTER — Encounter: Payer: Self-pay | Admitting: Physical Therapy

## 2021-08-27 ENCOUNTER — Ambulatory Visit: Payer: 59 | Admitting: Physical Therapy

## 2021-08-27 DIAGNOSIS — G8929 Other chronic pain: Secondary | ICD-10-CM | POA: Diagnosis not present

## 2021-08-27 DIAGNOSIS — M545 Low back pain, unspecified: Secondary | ICD-10-CM

## 2021-08-27 DIAGNOSIS — M6281 Muscle weakness (generalized): Secondary | ICD-10-CM | POA: Diagnosis not present

## 2021-08-27 NOTE — Therapy (Signed)
[place], [name], [address] Phone: [phone]   Fax:  [phone]  Physical Therapy Treatment  Patient Details  Name: April [name] MRN: [id number] Date of Birth: 08/02/1976 Referring Provider (PT): Lyndal Pulley, DO   Encounter Date: 08/27/2021   PT End of Session - 08/27/21 1026     Visit Number 8    Number of Visits 13    Date for PT Re-Evaluation 09/15/21    Authorization Type UMR    PT Start Time 1017    PT Stop Time 1100    PT Time Calculation (min) 43 min             Past Medical History:  Diagnosis Date   ALLERGIC RHINITIS    Anal fissure    Anxiety    ANXIETY DEPRESSION    Back pain    CONSTIPATION    Constipation    DEPRESSION    Esophageal reflux    Food allergy    [place]   Gastritis    GERD (gastroesophageal reflux disease)    HYPERCHOLESTEROLEMIA    Hyperlipidemia    Hypertension    OBESITY    Occipital neuralgia    Palpitations    Rheumatoid arthritis ([place]) 12/24/2017    Past Surgical History:  Procedure Laterality Date   NO PAST SURGERIES      There were no vitals filed for this visit.   Subjective Assessment - 08/27/21 1016     Subjective I am having a good day. I am having more good days consistently.    Pertinent History RA    Diagnostic tests IMPRESSION:  MRI LUMBAR SPINE IMPRESSION:     1. Moderately advanced degenerative disc disease at L5-S1 with  associated left subarticular disc protrusion, closely approximating  and potentially irritating the descending left S1 nerve root.  Prominent discogenic reactive marrow edema at this level could  contribute to lower back pain.  2. Shallow right subarticular disc protrusion at L4-5 without  significant stenosis or neural impingement.  3. Mild bilateral L3 through L5 foraminal stenosis related to disc  bulge and facet hypertrophy     MRI PELVIS IMPRESSION:     1. Normal MRI of the SI joints.  2. Mild  edema about the greater trochanters bilaterally, left  slightly worse than right, suggesting trochanteric bursitis.  3. Suspected focal labral/acetabular tear at the superolateral right  hip.    Currently in Pain? Yes    Pain Score 3     Pain Location Sacrum    Pain Orientation Right;Left    Pain Descriptors / Indicators Aching    Pain Type Chronic pain    Aggravating Factors  weather, lifting    Pain Relieving Factors exercises. rest               [facility] Adult PT Treatment/Exercise - 08/27/21 0001       Therapeutic Activites    Therapeutic Activities Lifting    Lifting Hip hinge with and without wooden dowel- able to retrun demo without wooden dowel and min verbal cues , 10 reps      Lumbar Exercises: Stretches   Piriformis Stretch 30 seconds    Piriformis Stretch Limitations x2; bilateral   seated     Knee/Hip Exercises: Aerobic   Nustep 5 minutes level 6 UE/LE      Knee/Hip Exercises: Machines for Strengthening   Cybex Leg Press 2 x 15 @ 40 lbs    Hip  Cybex 2 x 10 hip flexion 12.5 lbs      Knee/Hip Exercises: Standing   Forward Step Up Limitations runner's step up 2 x 10 bilateral 4 inch step    Other Standing Knee Exercises 3 way hip airex 1 x 10 bilateral    Other Standing Knee Exercises wall squat 2 x 10                       PT Short Term Goals - 08/22/21 1825       PT SHORT TERM GOAL #1   Title Patient will be independent with initial home program.    Baseline Issued at eval.    Status Achieved    Target Date 08/13/21      PT SHORT TERM GOAL #2   Title Patient will demonstrate no Gower sign when returning to standing from trunk flexed position    Baseline Gower sign    Status Achieved    Target Date 08/20/21      PT SHORT TERM GOAL #3   Title Patient will report tolerating at least 15 minutes of standing activity.    Baseline 5 minutes; 10 minutes 10/5    Status On-going    Target Date 08/20/21      PT SHORT TERM GOAL #4   Title  Patient will demonstrate proper pelvic alignment maintained between session.    Baseline Lt posterior rotated innominate    Status Achieved    Target Date 08/20/21               PT Long Term Goals - 07/30/21 1346       PT LONG TERM GOAL #1   Title Patient will be independent with advanced home program to assist in management of her chronic condition.    Baseline n/a    Status New    Target Date 09/10/21      PT LONG TERM GOAL #2   Title Patient will demonstrate at least 4+/5 bilateral hip strength to improve stability about the chain with prolonged standing and walking activity.    Baseline see flowsheet    Status New    Target Date 09/10/21      PT LONG TERM GOAL #3   Title Patient will score at least 60% function on FOTO to signify clinically meaningful improvement in functional abilities.    Baseline 50%    Status New    Target Date 09/10/21      PT LONG TERM GOAL #4   Title Patient will report pain at worst as 5/10 indicative of improvement in her overall condition.    Baseline 8/10    Status New    Target Date 09/10/21      PT LONG TERM GOAL #5   Title Patient will demonstrate proper bending and lifting mechanics of light weight to improve ability to lift and carry laundry at home.    Baseline unable    Status New    Target Date 09/10/21                   Plan - 08/27/21 1115     Clinical Impression Statement Pt reports she is having a good day on arrival. Continued with closed chain core and hip stability with improved tolerance. Revisited hip hinge and patient able to return demonstration without increased pain with min cues and feedback from wooden dowel for spine alignment.    PT Treatment/Interventions ADLs/Self Care Home Management;Cryotherapy;Electrical Stimulation;Iontophoresis 4mg /ml  Dexamethasone;Moist Heat;Traction;Therapeutic activities;Therapeutic exercise;Gait training;Stair training;Balance training;Neuromuscular  re-education;Patient/family education;Manual techniques;Passive range of motion;Dry needling;Taping;Spinal Manipulations    PT Next Visit Plan continue with standing strengthening focusing on core and hips, progress CKC strengthening as tolerated; progress hip hinge if able    PT Home Exercise Plan Access Code: [email or web address]             Patient will benefit from skilled therapeutic intervention in order to improve the following deficits and impairments:  Pain, Impaired flexibility, Improper body mechanics, Postural dysfunction, Decreased strength  Visit Diagnosis: Chronic bilateral low back pain without sciatica  Muscle weakness (generalized)     Problem List Patient Active Problem List   Diagnosis Date Noted   Low back pain 05/31/2021   Elevated LFTs 05/30/2021   Thrombocytosis 05/30/2021   Encounter for general adult medical examination with abnormal findings 05/30/2021   Abscess of skin of abdomen 05/30/2021   Psychophysiological insomnia 05/30/2021   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   Hypertension 12/14/2019   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

## 2021-08-28 ENCOUNTER — Other Ambulatory Visit (HOSPITAL_COMMUNITY): Payer: Self-pay

## 2021-08-30 ENCOUNTER — Ambulatory Visit: Payer: 59 | Admitting: Physical Therapy

## 2021-08-30 ENCOUNTER — Other Ambulatory Visit: Payer: Self-pay

## 2021-08-30 ENCOUNTER — Encounter: Payer: Self-pay | Admitting: Physical Therapy

## 2021-08-30 DIAGNOSIS — M545 Low back pain, unspecified: Secondary | ICD-10-CM

## 2021-08-30 DIAGNOSIS — G8929 Other chronic pain: Secondary | ICD-10-CM

## 2021-08-30 DIAGNOSIS — M6281 Muscle weakness (generalized): Secondary | ICD-10-CM | POA: Diagnosis not present

## 2021-08-30 NOTE — Therapy (Signed)
[place], [name], [address] Phone: [phone]   Fax:  [phone]  Physical Therapy Treatment  Patient Details  Name: April [name] MRN: [id number] Date of Birth: [birth year] Referring Provider (PT): Lyndal Pulley, DO   Encounter Date: 08/30/2021   PT End of Session - 08/30/21 0936     Visit Number 9    Number of Visits 13    Date for PT Re-Evaluation 09/15/21    Authorization Type UMR    PT Start Time 0930    PT Stop Time 1013    PT Time Calculation (min) 43 min             Past Medical History:  Diagnosis Date   ALLERGIC RHINITIS    Anal fissure    Anxiety    ANXIETY DEPRESSION    Back pain    CONSTIPATION    Constipation    DEPRESSION    Esophageal reflux    Food allergy    [place]   Gastritis    GERD (gastroesophageal reflux disease)    HYPERCHOLESTEROLEMIA    Hyperlipidemia    Hypertension    OBESITY    Occipital neuralgia    Palpitations    Rheumatoid arthritis ([place]) 12/24/2017    Past Surgical History:  Procedure Laterality Date   NO PAST SURGERIES      There were no vitals filed for this visit.   Subjective Assessment - 08/30/21 0934     Subjective I was very sore in my quads after last visit. But my knees did not bother me like they did before.    Diagnostic tests IMPRESSION:  MRI LUMBAR SPINE IMPRESSION:     1. Moderately advanced degenerative disc disease at L5-S1 with  associated left subarticular disc protrusion, closely approximating  and potentially irritating the descending left S1 nerve root.  Prominent discogenic reactive marrow edema at this level could  contribute to lower back pain.  2. Shallow right subarticular disc protrusion at L4-5 without  significant stenosis or neural impingement.  3. Mild bilateral L3 through L5 foraminal stenosis related to disc  bulge and facet hypertrophy     MRI PELVIS IMPRESSION:     1. Normal MRI of the SI joints.   2. Mild edema about the greater trochanters bilaterally, left  slightly worse than right, suggesting trochanteric bursitis.  3. Suspected focal labral/acetabular tear at the superolateral right  hip.    Currently in Pain? Yes    Pain Score 3     Pain Location Sacrum    Pain Orientation Right;Left    Pain Descriptors / Indicators Aching                               [facility] Adult PT Treatment/Exercise - 08/30/21 0001       Therapeutic Activites    Lifting Hip hinge with and without wooden dowel- able to retrun demo without wooden dowel and min verbal cues , 10 reps- added 5# KB lift ftom 6 inch step -      Knee/Hip Exercises: Aerobic   Nustep 5 minutes level 6 UE/LE      Knee/Hip Exercises: Machines for Strengthening   Cybex Leg Press 1 x 15 @ 50 lbs, 1x 15 @ 40 lbs    Hip Cybex 2 x 10 hip flexion 12.5 lbs      Knee/Hip Exercises: Standing   Forward Step Up  Limitations runner's step up 2 x 10 bilateral 6 inch step    Other Standing Knee Exercises 3 way hip airex 1 x 10 bilateral   2 # on ankle   Other Standing Knee Exercises wall squat 2 x 10                       PT Short Term Goals - 08/22/21 1825       PT SHORT TERM GOAL #1   Title Patient will be independent with initial home program.    Baseline Issued at eval.    Status Achieved    Target Date 08/13/21      PT SHORT TERM GOAL #2   Title Patient will demonstrate no Gower sign when returning to standing from trunk flexed position    Baseline Gower sign    Status Achieved    Target Date 08/20/21      PT SHORT TERM GOAL #3   Title Patient will report tolerating at least 15 minutes of standing activity.    Baseline 5 minutes; 10 minutes 10/5    Status On-going    Target Date 08/20/21      PT SHORT TERM GOAL #4   Title Patient will demonstrate proper pelvic alignment maintained between session.    Baseline Lt posterior rotated innominate    Status Achieved    Target Date 08/20/21                PT Long Term Goals - 07/30/21 1346       PT LONG TERM GOAL #1   Title Patient will be independent with advanced home program to assist in management of her chronic condition.    Baseline n/a    Status New    Target Date 09/10/21      PT LONG TERM GOAL #2   Title Patient will demonstrate at least 4+/5 bilateral hip strength to improve stability about the chain with prolonged standing and walking activity.    Baseline see flowsheet    Status New    Target Date 09/10/21      PT LONG TERM GOAL #3   Title Patient will score at least 60% function on FOTO to signify clinically meaningful improvement in functional abilities.    Baseline 50%    Status New    Target Date 09/10/21      PT LONG TERM GOAL #4   Title Patient will report pain at worst as 5/10 indicative of improvement in her overall condition.    Baseline 8/10    Status New    Target Date 09/10/21      PT LONG TERM GOAL #5   Title Patient will demonstrate proper bending and lifting mechanics of light weight to improve ability to lift and carry laundry at home.    Baseline unable    Status New    Target Date 09/10/21                   Plan - 08/30/21 1227     Clinical Impression Statement Pt reports she was sore in her quads after last session. She notes decreased soreness today. Continued with closed chain strengthening focus. Progressed hip hinge with light KB lifts and she reported no increased pain.    PT Next Visit Plan continue with standing strengthening focusing on core and hips, progress CKC strengthening as tolerated; progress hip hinge if able    PT Home Exercise Plan Access Code: [email or web address]  Patient will benefit from skilled therapeutic intervention in order to improve the following deficits and impairments:  Pain, Impaired flexibility, Improper body mechanics, Postural dysfunction, Decreased strength  Visit Diagnosis: Chronic bilateral low back pain without  sciatica  Muscle weakness (generalized)     Problem List Patient Active Problem List   Diagnosis Date Noted   Low back pain 05/31/2021   Elevated LFTs 05/30/2021   Thrombocytosis 05/30/2021   Encounter for general adult medical examination with abnormal findings 05/30/2021   Abscess of skin of abdomen 05/30/2021   Psychophysiological insomnia 05/30/2021   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 [place], [name], [address] Phone: [phone]   Fax:  [phone]  Name: ANNISSA ANDREONI MRN: [id number] Date of Birth: [birth year]

## 2021-09-03 ENCOUNTER — Ambulatory Visit: Payer: 59

## 2021-09-05 ENCOUNTER — Other Ambulatory Visit: Payer: Self-pay

## 2021-09-05 ENCOUNTER — Ambulatory Visit: Payer: 59

## 2021-09-05 DIAGNOSIS — M6281 Muscle weakness (generalized): Secondary | ICD-10-CM | POA: Diagnosis not present

## 2021-09-05 DIAGNOSIS — G8929 Other chronic pain: Secondary | ICD-10-CM

## 2021-09-05 DIAGNOSIS — M545 Low back pain, unspecified: Secondary | ICD-10-CM

## 2021-09-05 NOTE — Therapy (Signed)
[place], [name], [address] Phone: [phone]   Fax:  [phone]  Physical Therapy Treatment/Discharge   Patient Details  Name: April [name] MRN: [id number] Date of Birth: 07-Mar-1976 Referring Provider (PT): Lyndal Pulley, DO   Encounter Date: 09/05/2021   PT End of Session - 09/05/21 1527     Visit Number 10    Number of Visits 13    Date for PT Re-Evaluation 09/15/21    Authorization Type UMR    PT Start Time 1530    PT Stop Time 1600    PT Time Calculation (min) 30 min    Activity Tolerance Patient tolerated treatment well    Behavior During Therapy [place] for tasks assessed/performed             Past Medical History:  Diagnosis Date   ALLERGIC RHINITIS    Anal fissure    Anxiety    ANXIETY DEPRESSION    Back pain    CONSTIPATION    Constipation    DEPRESSION    Esophageal reflux    Food allergy    [place]   Gastritis    GERD (gastroesophageal reflux disease)    HYPERCHOLESTEROLEMIA    Hyperlipidemia    Hypertension    OBESITY    Occipital neuralgia    Palpitations    Rheumatoid arthritis ([place]) 12/24/2017    Past Surgical History:  Procedure Laterality Date   NO PAST SURGERIES      There were no vitals filed for this visit.   Subjective Assessment - 09/05/21 1531     Subjective Patient reports subjective overall improvement of 50% reporting that this is "pretty good in (her) opinion." Patient feels that she is ready for discharge and can continue with her HEP independently to maintain/progress her current level of function.    How long can you sit comfortably? no issues    How long can you stand comfortably? 5-10 minutes    How long can you walk comfortably? 20-30 minutes    Diagnostic tests IMPRESSION:  MRI LUMBAR SPINE IMPRESSION:     1. Moderately advanced degenerative disc disease at L5-S1 with  associated left subarticular disc protrusion, closely  approximating  and potentially irritating the descending left S1 nerve root.  Prominent discogenic reactive marrow edema at this level could  contribute to lower back pain.  2. Shallow right subarticular disc protrusion at L4-5 without  significant stenosis or neural impingement.  3. Mild bilateral L3 through L5 foraminal stenosis related to disc  bulge and facet hypertrophy     MRI PELVIS IMPRESSION:     1. Normal MRI of the SI joints.  2. Mild edema about the greater trochanters bilaterally, left  slightly worse than right, suggesting trochanteric bursitis.  3. Suspected focal labral/acetabular tear at the superolateral right  hip.    Currently in Pain? Yes    Pain Score 5     Pain Location Sacrum    Pain Orientation Posterior    Pain Descriptors / Indicators Aching    Pain Type Chronic pain    Pain Onset More than a month ago    Pain Frequency Intermittent    Aggravating Factors  repetitive bending, worse in the PM.    Pain Relieving Factors exercise, rest                [facility] PT Assessment - 09/05/21 0001       Assessment   Medical Diagnosis M54.50 (  ICD-10-CM) - Low back pain, unspecified back pain laterality, unspecified chronicity, unspecified whether sciatica present    Referring Provider (PT) Lyndal Pulley, DO      Observation/Other Assessments   Focus on Therapeutic Outcomes (FOTO)  52% function      Functional Tests   Functional tests Squat      Squat   Comments normal mechanics, no increased pain      AROM   Lumbar Flexion Full; no gowers sign    Lumbar Extension Full; pain at end range    Lumbar - Right Side Bend Full; pain at end range    Lumbar - Left Side Bend Full; pain at end range    Lumbar - Right Rotation Full; pain at end range    Lumbar - Left Rotation Full; pain at end range      Strength   Right Hip Flexion 5/5    Right Hip Extension 5/5    Right Hip ABduction 5/5    Left Hip Flexion 5/5    Left Hip Extension 5/5    Left Hip ABduction 5/5       Palpation   Palpation comment symmetrical pelvic alignment      Special Tests   Other special tests (-) SLR Rt (+) FABER Rt                           [facility] Adult PT Treatment/Exercise - 09/05/21 0001       Self-Care   Other Self-Care Comments  see patient education                     PT Education - 09/05/21 1605     Education Details Education on re-assessment findings, FOTO score, D/C education, reviewed and updated HEP, recommended to continue with HEP over the next month and discuss further pain interventions at next appointment with Dr. Tamala Julian if needed.    Person(s) Educated Patient    Methods Explanation;Demonstration;Handout    Comprehension Verbalized understanding;Returned demonstration              PT Short Term Goals - 09/05/21 1536       PT SHORT TERM GOAL #1   Title Patient will be independent with initial home program.    Baseline Issued at eval.    Status Achieved    Target Date 08/13/21      PT SHORT TERM GOAL #2   Title Patient will demonstrate no Gower sign when returning to standing from trunk flexed position    Baseline Gower sign    Status Achieved    Target Date 08/20/21      PT SHORT TERM GOAL #3   Title Patient will report tolerating at least 15 minutes of standing activity.    Baseline 5 minutes; 10 minutes 10/19    Status On-going    Target Date 08/20/21      PT SHORT TERM GOAL #4   Title Patient will demonstrate proper pelvic alignment maintained between session.    Baseline Lt posterior rotated innominate    Status Achieved    Target Date 08/20/21               PT Long Term Goals - 09/05/21 1536       PT LONG TERM GOAL #1   Title Patient will be independent with advanced home program to assist in management of her chronic condition.    Baseline n/a  Status Achieved      PT LONG TERM GOAL #2   Title Patient will demonstrate at least 4+/5 bilateral hip strength to improve stability  about the chain with prolonged standing and walking activity.    Baseline see flowsheet    Status Achieved      PT LONG TERM GOAL #3   Title Patient will score at least 60% function on FOTO to signify clinically meaningful improvement in functional abilities.    Baseline 50%    Status Not Met      PT LONG TERM GOAL #4   Title Patient will report pain at worst as 5/10 indicative of improvement in her overall condition.    Baseline 8/10; 7/10 on 10/19    Status Not Met      PT LONG TERM GOAL #5   Title Patient will demonstrate proper bending and lifting mechanics of light weight to improve ability to lift and carry laundry at home.    Baseline unable    Status Achieved                   Plan - 09/05/21 1538     Clinical Impression Statement April [name] has attended 10 PT sessions having met the majority of her functional goals. She demonstrates improvements in hip strength and lumbopelvic stability. She has symmetrical pelvic alignment and normal lumbar AROM, though continues to report pain at end range of all lumbar AROM with the exception of flexion. She has improved tolerance to standing and walking activity, however these activties do remain pain provoking. She is pleased with her overall progress in therapy and demonstrates independence with advanced home program. She is therefore appropriate for discharge at this time.    PT Treatment/Interventions ADLs/Self Care Home Management;Cryotherapy;Electrical Stimulation;Iontophoresis 65m/ml Dexamethasone;Moist Heat;Traction;Therapeutic activities;Therapeutic exercise;Gait training;Stair training;Balance training;Neuromuscular re-education;Patient/family education;Manual techniques;Passive range of motion;Dry needling;Taping;Spinal Manipulations    PT Next Visit Plan continue with standing strengthening focusing on core and hips, progress CKC strengthening as tolerated; progress hip hinge if able    PT Home Exercise Plan Access Code:  [email or web address]             Patient will benefit from skilled therapeutic intervention in order to improve the following deficits and impairments:  Pain, Impaired flexibility, Improper body mechanics, Postural dysfunction, Decreased strength  Visit Diagnosis: Chronic bilateral low back pain without sciatica  Muscle weakness (generalized)     Problem List Patient Active Problem List   Diagnosis Date Noted   Low back pain 05/31/2021   Elevated LFTs 05/30/2021   Thrombocytosis 05/30/2021   Encounter for general adult medical examination with abnormal findings 05/30/2021   Abscess of skin of abdomen 05/30/2021   Psychophysiological insomnia 05/30/2021   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   Hypertension 12/14/2019   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 04/19/2018   Allergic rhinitis due to animal hair and dander 02/20/2018   Rheumatoid arthritis ([place] 12/24/2017   Chronic sinusitis 01/07/2017   Major depressive disorder, recurrent episode, moderate ([place] 12/02/2014   Hyperlipidemia 11/16/2008   Obesity 11/16/2008   ANXIETY DEPRESSION 11/16/2008   Esophageal reflux 11/16/2008   Constipation 11/16/2008   PHYSICAL THERAPY DISCHARGE SUMMARY  Visits from Start of Care: 10  Current functional level related to goals / functional outcomes: See goals above   Remaining deficits: See impression above   Education / Equipment: See education above    Patient agrees to discharge. Patient goals were partially met. Patient is being discharged due to being  pleased with the current functional level. Gwendolyn Grant, PT, DPT, ATC 09/05/21 4:09 PM   [facility] [address] [place], [name], [address] Phone: [phone]   Fax:  [phone]  Name: April [name] MRN: [id number] Date of Birth: Jan 11, 1976

## 2021-09-06 ENCOUNTER — Other Ambulatory Visit (HOSPITAL_COMMUNITY): Payer: Self-pay

## 2021-09-11 ENCOUNTER — Other Ambulatory Visit: Payer: Self-pay

## 2021-09-11 ENCOUNTER — Encounter (INDEPENDENT_AMBULATORY_CARE_PROVIDER_SITE_OTHER): Payer: Self-pay | Admitting: Bariatrics

## 2021-09-11 ENCOUNTER — Ambulatory Visit (INDEPENDENT_AMBULATORY_CARE_PROVIDER_SITE_OTHER): Payer: 59 | Admitting: Bariatrics

## 2021-09-11 VITALS — BP 124/86 | HR 82 | Temp 98.4°F | Ht 70.0 in | Wt 242.0 lb

## 2021-09-11 DIAGNOSIS — Z6837 Body mass index (BMI) 37.0-37.9, adult: Secondary | ICD-10-CM | POA: Diagnosis not present

## 2021-09-11 DIAGNOSIS — E7849 Other hyperlipidemia: Secondary | ICD-10-CM | POA: Diagnosis not present

## 2021-09-11 DIAGNOSIS — I10 Essential (primary) hypertension: Secondary | ICD-10-CM

## 2021-09-11 NOTE — Progress Notes (Signed)
Chief Complaint:   OBESITY April [name] is here to discuss her progress with her obesity treatment plan along with follow-up of her obesity related diagnoses. April [name] is on keeping a food journal and adhering to recommended goals of 1300-1400 calories and 90+ grams of protein daily and states she is following her eating plan approximately 50% of the time. April [name] states she is on the treadmill for 20 minutes 4 times per week.  Today's visit was #: 7 Starting weight: 263 lbs Starting date: 05/17/2021 Today's weight: 242 lbs Today's date: 09/11/2021 Total lbs lost to date: 21 Total lbs lost since last in-office visit: 7  Interim History: April [name] is down an additional 7 lbs. She struggles with getting adequate protein.  Subjective:   1. Essential hypertension April [name]'s blood pressure is essentially well controlled.  2. Other hyperlipidemia April [name] is taking fenofibrate and fish oil.  Assessment/Plan:   1. Essential hypertension Takasha will continue her medications, and will continue working on healthy weight loss and exercise to improve blood pressure control. We will watch for signs of hypotension as she continues her lifestyle modifications.  2. Other hyperlipidemia Cardiovascular risk and specific lipid/LDL goals reviewed. We discussed several lifestyle modifications today. April [name] will continue her medications, and will continue to work on diet, exercise and weight loss efforts. Orders and follow up as documented in patient record.   Counseling Intensive lifestyle modifications are the first line treatment for this issue. Dietary changes: Increase soluble fiber. Decrease simple carbohydrates. Exercise changes: Moderate to vigorous-intensity aerobic activity 150 minutes per week if tolerated. Lipid-lowering medications: see documented in medical record.  3. Obesity with current BMI of 34.7 April [name] is currently in the action stage of change. As such, her goal is to continue  with weight loss efforts. She has agreed to keeping a food journal and adhering to recommended goals of 1300-1400 calories and 90+ grams of protein daily.   April [name] will adhere to the meal plan. Recipes were given today.  Exercise goals: As is, add strengthening.  Behavioral modification strategies: increasing lean protein intake, decreasing simple carbohydrates, increasing vegetables, increasing water intake, decreasing eating out, no skipping meals, meal planning and cooking strategies, keeping healthy foods in the home, and planning for success.  April [name] has agreed to follow-up with our clinic in 2 to 3 weeks with Dr. Raliegh Scarlet. She was informed of the importance of frequent follow-up visits to maximize her success with intensive lifestyle modifications for her multiple health conditions.   Objective:   Pulse 82, temperature 98.4 F (36.9 C), height 5\' 10"  (1.778 m), weight 242 lb (109.8 kg), SpO2 98 %. Body mass index is 34.72 kg/m.  General: Cooperative, alert, well developed, in no acute distress. HEENT: Conjunctivae and lids unremarkable. Cardiovascular: Regular rhythm.  Lungs: Normal work of breathing. Neurologic: No focal deficits.   Lab Results  Component Value Date   CREATININE 0.77 05/17/2021   BUN 11 05/17/2021   NA 140 05/17/2021   K 4.8 05/17/2021   CL 103 05/17/2021   CO2 23 05/17/2021   Lab Results  Component Value Date   ALT 33 (H) 05/17/2021   AST 27 05/17/2021   ALKPHOS 96 05/17/2021   BILITOT 0.3 05/17/2021   Lab Results  Component Value Date   HGBA1C 5.7 (H) 05/17/2021   HGBA1C 5.4 05/26/2020   HGBA1C 5.5 05/26/2019   HGBA1C 5.6 05/22/2018   HGBA1C 5.8 05/06/2017   Lab Results  Component Value Date   INSULIN 16.3 05/17/2021   Lab  Results  Component Value Date   TSH 0.955 05/17/2021   Lab Results  Component Value Date   CHOL 225 (A) 05/11/2021   HDL 66 05/11/2021   LDLCALC 128 05/11/2021   LDLDIRECT 96.0 05/26/2019   TRIG 179 (A)  05/11/2021   CHOLHDL 3.1 05/26/2020   Lab Results  Component Value Date   VD25OH 75.2 05/17/2021   VD25OH 59 05/26/2020   VD25OH 41.41 05/06/2017   Lab Results  Component Value Date   WBC 5.4 05/17/2021   HGB 12.6 05/17/2021   HCT 39.9 05/17/2021   MCV 92 05/17/2021   PLT 418 (A) 05/11/2021   Lab Results  Component Value Date   IRON 82 05/30/2021   FERRITIN 290.9 05/30/2021   Attestation Statements:   Reviewed by clinician on day of visit: allergies, medications, problem list, medical history, surgical history, family history, social history, and previous encounter notes.   Wilhemena Durie, am acting as Location manager for [employer], DO.  I have reviewed the above documentation for accuracy and completeness, and I agree with the above. Jearld Lesch, DO

## 2021-09-20 ENCOUNTER — Other Ambulatory Visit (HOSPITAL_COMMUNITY): Payer: Self-pay

## 2021-09-20 DIAGNOSIS — L720 Epidermal cyst: Secondary | ICD-10-CM | POA: Diagnosis not present

## 2021-09-20 DIAGNOSIS — L400 Psoriasis vulgaris: Secondary | ICD-10-CM | POA: Diagnosis not present

## 2021-09-20 DIAGNOSIS — L02415 Cutaneous abscess of right lower limb: Secondary | ICD-10-CM | POA: Diagnosis not present

## 2021-09-20 DIAGNOSIS — L0889 Other specified local infections of the skin and subcutaneous tissue: Secondary | ICD-10-CM | POA: Diagnosis not present

## 2021-09-20 DIAGNOSIS — B958 Unspecified staphylococcus as the cause of diseases classified elsewhere: Secondary | ICD-10-CM | POA: Diagnosis not present

## 2021-09-20 DIAGNOSIS — Z79899 Other long term (current) drug therapy: Secondary | ICD-10-CM | POA: Diagnosis not present

## 2021-09-20 DIAGNOSIS — L732 Hidradenitis suppurativa: Secondary | ICD-10-CM | POA: Diagnosis not present

## 2021-09-20 DIAGNOSIS — L738 Other specified follicular disorders: Secondary | ICD-10-CM | POA: Diagnosis not present

## 2021-09-20 MED ORDER — CLOBETASOL PROPIONATE 0.05 % EX SOLN
1.0000 "application " | Freq: Every day | CUTANEOUS | 1 refills | Status: DC | PRN
  Filled 2021-09-20: qty 50, 30d supply, fill #0

## 2021-09-20 MED ORDER — CEPHALEXIN 500 MG PO CAPS
500.0000 mg | ORAL_CAPSULE | Freq: Two times a day (BID) | ORAL | 0 refills | Status: AC
  Filled 2021-09-20: qty 20, 10d supply, fill #0

## 2021-09-20 MED ORDER — CLINDAMYCIN PHOSPHATE 1 % EX SOLN
1.0000 "application " | Freq: Every day | CUTANEOUS | 3 refills | Status: DC
  Filled 2021-09-20: qty 60, 30d supply, fill #0

## 2021-09-20 MED ORDER — CLINDAMYCIN PHOSPHATE 1 % EX LOTN
1.0000 "application " | TOPICAL_LOTION | Freq: Every day | CUTANEOUS | 3 refills | Status: DC
  Filled 2021-09-20: qty 60, 20d supply, fill #0

## 2021-09-21 ENCOUNTER — Other Ambulatory Visit (HOSPITAL_COMMUNITY): Payer: Self-pay

## 2021-09-24 ENCOUNTER — Other Ambulatory Visit (HOSPITAL_COMMUNITY): Payer: Self-pay

## 2021-09-25 ENCOUNTER — Other Ambulatory Visit: Payer: Self-pay

## 2021-09-25 ENCOUNTER — Ambulatory Visit (INDEPENDENT_AMBULATORY_CARE_PROVIDER_SITE_OTHER): Payer: 59 | Admitting: Family Medicine

## 2021-09-25 ENCOUNTER — Encounter (INDEPENDENT_AMBULATORY_CARE_PROVIDER_SITE_OTHER): Payer: Self-pay | Admitting: Family Medicine

## 2021-09-25 VITALS — BP 123/80 | HR 53 | Temp 97.9°F | Ht 70.0 in | Wt 239.0 lb

## 2021-09-25 DIAGNOSIS — Z9189 Other specified personal risk factors, not elsewhere classified: Secondary | ICD-10-CM

## 2021-09-25 DIAGNOSIS — E7849 Other hyperlipidemia: Secondary | ICD-10-CM | POA: Diagnosis not present

## 2021-09-25 DIAGNOSIS — Z6837 Body mass index (BMI) 37.0-37.9, adult: Secondary | ICD-10-CM | POA: Diagnosis not present

## 2021-09-25 DIAGNOSIS — R7303 Prediabetes: Secondary | ICD-10-CM

## 2021-09-25 DIAGNOSIS — R748 Abnormal levels of other serum enzymes: Secondary | ICD-10-CM | POA: Diagnosis not present

## 2021-09-25 DIAGNOSIS — E559 Vitamin D deficiency, unspecified: Secondary | ICD-10-CM | POA: Diagnosis not present

## 2021-09-26 NOTE — Progress Notes (Signed)
Chief Complaint:   OBESITY Shonita is here to discuss her progress with her obesity treatment plan along with follow-up of her obesity related diagnoses. Luther is on keeping a food journal and adhering to recommended goals of 1300-1400 calories and 90+ grams of protein daily and states she is following her eating plan approximately 50% of the time. Shandell states she is on the treadmill for 25 minutes 4 times per week.  Today's visit was #: 8 Starting weight: 263 lbs Starting date: 05/17/2021 Today's weight: 239 lbs Today's date: 09/25/2021 Total lbs lost to date: 24 Total lbs lost since last in-office visit: 3  Interim History: Lawson states her protein is still not where it should be. She notes logging is very helpful, but she forgot her sheets. Her calories are usually <1500 calories daily, and she usually hits around 80 grams of protein daily. About 30% of the time, she hits her protein and calorie goals. She is drinking 80 oz of water per day.  Subjective:   1. Pre-diabetes Indiah has a diagnosis of pre-diabetes based on her elevated HgA1c and was informed this puts her at greater risk of developing diabetes. She continues to work on diet and exercise to decrease her risk of diabetes. She denies nausea or hypoglycemia.  2. Other hyperlipidemia Sheyann has hyperlipidemia and has been trying to improve her cholesterol levels with intensive lifestyle modification including a low saturated fat diet, exercise and weight loss. She denies any chest pain, claudication or myalgias.  3. Vitamin D deficiency Alisah is currently taking OTC vitamin D 5,000 units each day. She denies nausea, vomiting or muscle weakness.  4. Elevated liver enzymes Aubryana has a diagnosis of elevated ALT. She denies abdominal pain or jaundice and has never been told of any liver problems in the past. She denies excessive alcohol intake.  5. At risk for constipation Slyvia is at increased risk for  constipation due to inadequate water intake and fiber intake.  Assessment/Plan:   Orders Placed This Encounter  Procedures   Hemoglobin A1c   Insulin, random   Comprehensive metabolic panel   Lipid Panel With LDL/HDL Ratio   VITAMIN D 25 Hydroxy (Vit-D Deficiency, Fractures)    Medications Discontinued During This Encounter  Medication Reason   esomeprazole (NEXIUM) 20 MG capsule Error   etodolac (LODINE) 400 MG tablet Error     No orders of the defined types were placed in this encounter.    1. Pre-diabetes We will recheck labs at her next office visit. [place] declines medications will focus on increasing her protein and decreasing simple carbohydrates to help decrease the risk of diabetes.   - Hemoglobin A1c - Insulin, random  2. Other hyperlipidemia Cardiovascular risk and specific lipid/LDL goals reviewed. We discussed several lifestyle modifications today. We will recheck labs at her next office visit. Yara will continue to work on diet, exercise and weight loss efforts. Orders and follow up as documented in patient record.   Counseling Intensive lifestyle modifications are the first line treatment for this issue. Dietary changes: Increase soluble fiber. Decrease simple carbohydrates. Exercise changes: Moderate to vigorous-intensity aerobic activity 150 minutes per week if tolerated. Lipid-lowering medications: see documented in medical record.  - Comprehensive metabolic panel - Lipid Panel With LDL/HDL Ratio  3. Vitamin D deficiency Low Vitamin D level contributes to fatigue and are associated with obesity, breast, and colon cancer. Derika will continue Vitamin D 5,000 IU daily, and we will recheck labs at her next office  visit. She will follow-up for routine testing of Vitamin D, at least 2-3 times per year to avoid over-replacement.  - VITAMIN D 25 Hydroxy (Vit-D Deficiency, Fractures)  4. Elevated liver enzymes We discussed the likely diagnosis of  non-alcoholic fatty liver disease today and how this condition is obesity related. Coretha was educated the importance of weight loss. Leather agreed to continue with her weight loss efforts with healthier diet and exercise as an essential part of her treatment plan. We will recheck labs at her next office visit.  - Comprehensive metabolic panel  5. At risk for constipation Jonea was given approximately 9 minutes of counseling today regarding prevention of constipation. She was encouraged to increase water and fiber intake.   6. Obesity with current BMI of 34.4 Monet is currently in the action stage of change. As such, her goal is to continue with weight loss efforts. She has agreed to keeping a food journal and adhering to recommended goals of 1300-1400 calories and 90+ grams of protein daily.   For her next office visit, Shanice's goal is to prepare lunch at home and bring it with her. We discussed higher protein snacks.  We will obtain fasting labs at her next office visit.  Exercise goals: As is.  Behavioral modification strategies: increasing lean protein intake, decreasing simple carbohydrates, planning for success, and keeping a strict food journal.  Ashleigh has agreed to follow-up with our clinic in 2 to 3 weeks. She was informed of the importance of frequent follow-up visits to maximize her success with intensive lifestyle modifications for her multiple health conditions.   Objective:   Blood pressure 123/80, pulse (!) 53, temperature 97.9 F (36.6 C), height 5\' 10"  (1.778 m), weight 239 lb (108.4 kg), SpO2 97 %. Body mass index is 34.29 kg/m.  General: Cooperative, alert, well developed, in no acute distress. HEENT: Conjunctivae and lids unremarkable. Cardiovascular: Regular rhythm.  Lungs: Normal work of breathing. Neurologic: No focal deficits.   Lab Results  Component Value Date   CREATININE 0.77 05/17/2021   BUN 11 05/17/2021   NA 140 05/17/2021   K 4.8  05/17/2021   CL 103 05/17/2021   CO2 23 05/17/2021   Lab Results  Component Value Date   ALT 33 (H) 05/17/2021   AST 27 05/17/2021   ALKPHOS 96 05/17/2021   BILITOT 0.3 05/17/2021   Lab Results  Component Value Date   HGBA1C 5.7 (H) 05/17/2021   HGBA1C 5.4 05/26/2020   HGBA1C 5.5 05/26/2019   HGBA1C 5.6 05/22/2018   HGBA1C 5.8 05/06/2017   Lab Results  Component Value Date   INSULIN 16.3 05/17/2021   Lab Results  Component Value Date   TSH 0.955 05/17/2021   Lab Results  Component Value Date   CHOL 225 (A) 05/11/2021   HDL 66 05/11/2021   LDLCALC 128 05/11/2021   LDLDIRECT 96.0 05/26/2019   TRIG 179 (A) 05/11/2021   CHOLHDL 3.1 05/26/2020   Lab Results  Component Value Date   VD25OH 75.2 05/17/2021   VD25OH 59 05/26/2020   VD25OH 41.41 05/06/2017   Lab Results  Component Value Date   WBC 5.4 05/17/2021   HGB 12.6 05/17/2021   HCT 39.9 05/17/2021   MCV 92 05/17/2021   PLT 418 (A) 05/11/2021   Lab Results  Component Value Date   IRON 82 05/30/2021   FERRITIN 290.9 05/30/2021   Attestation Statements:   Reviewed by clinician on day of visit: allergies, medications, problem list, medical history, surgical history,  family history, social history, and previous encounter notes.   Wilhemena Durie, am acting as transcriptionist for [employer], DO.  I have reviewed the above documentation for accuracy and completeness, and I agree with the above. Marjory Sneddon, D.O.  [place] was signed into law in 2016 which includes the topic of electronic health records.  This provides immediate access to information in MyChart.  This includes consultation notes, operative notes, office notes, lab results and pathology reports.  If you have any questions about what you read please let us know at your next visit so we can discuss your concerns and take corrective action if need be.  We are right here with you.

## 2021-10-01 ENCOUNTER — Other Ambulatory Visit (HOSPITAL_COMMUNITY): Payer: Self-pay

## 2021-10-04 ENCOUNTER — Ambulatory Visit: Payer: 59 | Admitting: Family Medicine

## 2021-10-04 ENCOUNTER — Other Ambulatory Visit: Payer: Self-pay

## 2021-10-04 ENCOUNTER — Ambulatory Visit: Payer: 59 | Admitting: Sports Medicine

## 2021-10-04 VITALS — BP 116/70 | HR 69 | Ht 70.0 in | Wt 243.0 lb

## 2021-10-04 DIAGNOSIS — M9905 Segmental and somatic dysfunction of pelvic region: Secondary | ICD-10-CM | POA: Diagnosis not present

## 2021-10-04 DIAGNOSIS — M9901 Segmental and somatic dysfunction of cervical region: Secondary | ICD-10-CM | POA: Diagnosis not present

## 2021-10-04 DIAGNOSIS — M9903 Segmental and somatic dysfunction of lumbar region: Secondary | ICD-10-CM | POA: Diagnosis not present

## 2021-10-04 DIAGNOSIS — M9902 Segmental and somatic dysfunction of thoracic region: Secondary | ICD-10-CM

## 2021-10-04 DIAGNOSIS — M069 Rheumatoid arthritis, unspecified: Secondary | ICD-10-CM | POA: Diagnosis not present

## 2021-10-04 DIAGNOSIS — M9908 Segmental and somatic dysfunction of rib cage: Secondary | ICD-10-CM

## 2021-10-04 NOTE — Progress Notes (Signed)
Benito Mccreedy D.[place] Phone: [phone]   Assessment and Plan:     1. Somatic dysfunction of cervical region 2. Somatic dysfunction of thoracic region 3. Somatic dysfunction of lumbar region 4. Somatic dysfunction of pelvic region 5. Somatic dysfunction of rib region 6. Rheumatoid arthritis, involving unspecified site, unspecified whether rheumatoid factor present ([place]) -Chronic with exacerbation, subsequent visit - Recurrence of multiple chronic musculoskeletal complaints with most prominent being in low back - Patient has history of rheumatoid arthritis so HVLA was not performed on C-spine - Patient has received significant relief with OMT in the past.  Elects for repeat OMT today.  Tolerated well per note below. - Decision today to treat with OMT was based on Physical Exam   After verbal consent patient was treated with HVLA (high velocity low amplitude), ME (muscle energy), FPR (flex positional release), ST (soft tissue), PC/PD (Pelvic Compression/ Pelvic Decompression) techniques in cervical, rib, thoracic, lumbar, and pelvic areas. Patient tolerated the procedure well with improvement in symptoms.  Patient educated on potential side effects of soreness and recommended to rest, hydrate, and use Tylenol as needed for pain control.   Pertinent previous records reviewed include none   Follow Up: 6 weeks for repeat OMT   Subjective:   I, Judy Pimple, am serving as a scribe for Dr. Glennon Mac  Chief Complaint: Neck and low back pain   HPI:   10/04/21 Patient is a 45 year old female presenting with neck and low back pain. Patient was last seen by Dr. Tamala Julian on 08/23/21 for this reason and had OMT. Today patient states Si joints are her main areas for concern. Patient completed 6 weeks of PT since her last visit with Dr. Tamala Julian.   Relevant Historical Information: Rheumatoid arthritis,  hypertension  Additional pertinent review of systems negative.  Current Outpatient Medications  Medication Sig Dispense Refill   ascorbic acid (VITAMIN C) 1000 MG tablet Take 1,000 mg by mouth daily.     busPIRone (BUSPAR) 15 MG tablet Take 1 tablet (15 mg total) by mouth 2 (two) times daily. 180 tablet 1   celecoxib (CELEBREX) 200 MG capsule Take 1 capsule (200 mg total) by mouth 2 (two) times daily with food. (Patient taking differently: Take 200 mg by mouth 2 (two) times daily as needed.) 60 capsule 2   Certolizumab Pegol (CIMZIA PREFILLED) 2 X 200 MG/ML PSKT Use 1 injection subcutaneously every other week 2 each 6   cetirizine (ZYRTEC) 10 MG tablet Take 1 tablet (10 mg total) by mouth daily. 30 tablet 3   Cholecalciferol (VITAMIN D3) 125 MCG (5000 UT) CAPS Take 1 capsule by mouth daily.     clindamycin (CLEOCIN T) 1 % external solution Apply a small amount to the affected area(s) once daily after you shower. 60 mL 3   clobetasol (TEMOVATE) 0.05 % external solution Apply a small amount to scalp once daily as needed 50 mL 1   clobetasol ointment (TEMOVATE) 0.05 % APPLY TO AFFECTED AREA TWICE DAILY FOR 10 DAYS  0   escitalopram (LEXAPRO) 20 MG tablet Take 1 tablet (20 mg total) by mouth daily. 90 tablet 1   esomeprazole (NEXIUM) 40 MG capsule Take 1 capsule (40 mg total) by mouth daily. 90 capsule 3   etonogestrel-ethinyl estradiol (NUVARING) 0.12-0.015 MG/24HR vaginal ring REMOVE AND INSERT A NEW RING INTO THE VAGINA EVERY 4 WEEKS *USE CONTINUOUSLY* (Patient taking differently: REMOVE AND INSERT A NEW RING  INTO THE VAGINA EVERY 4 WEEKS *USE CONTINUOUSLY*) 3 each 4   famotidine (PEPCID) 20 MG tablet Take 20 mg by mouth at bedtime. Take 1 tablets by mouth daily at bedtime     fenofibrate 160 MG tablet Take 1 tablet (160 mg total) by mouth daily. 90 tablet 3   folic acid (FOLVITE) 1 MG tablet Take 1 tablet (1 mg total) by mouth daily. 90 tablet 1   gabapentin (NEURONTIN) 300 MG capsule Take 1  capsule (300 mg total) by mouth daily. 90 capsule 1   ibuprofen (ADVIL) 800 MG tablet Take 1 tablet (800 mg total) by mouth every 8 (eight) hours as needed. 90 tablet 0   Ibuprofen-Famotidine (DUEXIS) 800-26.6 MG TABS Take 800 mg by mouth 3 (three) times daily. (Patient taking differently: Take 800 mg by mouth 3 (three) times daily as needed.) 90 tablet 3   losartan (COZAAR) 50 MG tablet Take 1 tablet (50 mg total) by mouth daily. 90 tablet 1   MAGNESIUM MALATE PO Take 1 tablet by mouth at bedtime.     methotrexate 50 MG/2ML injection Inject 27m  (237m  once a week 12 mL 0   montelukast (SINGULAIR) 10 MG tablet Take 1 tablet (10 mg total) by mouth at bedtime. 90 tablet 1   Multiple Vitamin (MULTIVITAMIN) tablet Take 1 tablet by mouth daily.     Omega-3 Fatty Acids (FISH OIL) 1000 MG CAPS Take 2 capsules by mouth 2 (two) times daily.      zolpidem (AMBIEN) 5 MG tablet Take 1 tablet (5 mg total) by mouth at bedtime as needed for sleep. 90 tablet 1   cephALEXin (KEFLEX) 500 MG capsule Take 1 capsule (500 mg total) by mouth 2 (two) times daily for 10 days. 20 capsule 0   COVID-19 At Home Antigen Test (CARESTART COVID-19 HOME TEST) KIT Use  as directed 2 each 0   Probiotic Product (SOLUBLE FIBER/PROBIOTICS PO) Take 1 capsule by mouth daily.      No current facility-administered medications for this visit.      Objective:     Vitals:   10/04/21 1546  BP: 116/70  Pulse: 69  SpO2: 98%  Weight: 243 lb (110.2 kg)  Height: 5' 10"  (1.778 m)      Body mass index is 34.87 kg/m.    Physical Exam:     General: Well-appearing, cooperative, sitting comfortably in no acute distress.   OMT Physical Exam:  ASIS Compression Test: Positive Right Cervical: TTP paraspinal, C5-7 RRSR Rib: Bilateral elevated first rib with TTP Thoracic: TTP paraspinal, T4-8 RRSL Lumbar: TTP paraspinal, L1-3 RRSL Pelvis: Right anterior innominate  Electronically signed by:  BeBenito Mccreedy.O.[facility]  Medicine 4:44 PM 10/04/21

## 2021-10-04 NOTE — Patient Instructions (Addendum)
Good to see you   Follow up in 6 weeks for repeat OMT 

## 2021-10-16 ENCOUNTER — Other Ambulatory Visit: Payer: Self-pay

## 2021-10-16 ENCOUNTER — Encounter (INDEPENDENT_AMBULATORY_CARE_PROVIDER_SITE_OTHER): Payer: Self-pay | Admitting: Family Medicine

## 2021-10-16 ENCOUNTER — Ambulatory Visit (INDEPENDENT_AMBULATORY_CARE_PROVIDER_SITE_OTHER): Payer: 59 | Admitting: Family Medicine

## 2021-10-16 VITALS — BP 119/78 | HR 76 | Temp 97.7°F | Ht 70.0 in | Wt 233.0 lb

## 2021-10-16 DIAGNOSIS — K5909 Other constipation: Secondary | ICD-10-CM

## 2021-10-16 DIAGNOSIS — E559 Vitamin D deficiency, unspecified: Secondary | ICD-10-CM | POA: Diagnosis not present

## 2021-10-16 DIAGNOSIS — R7303 Prediabetes: Secondary | ICD-10-CM | POA: Diagnosis not present

## 2021-10-16 DIAGNOSIS — M069 Rheumatoid arthritis, unspecified: Secondary | ICD-10-CM | POA: Diagnosis not present

## 2021-10-16 DIAGNOSIS — Z9189 Other specified personal risk factors, not elsewhere classified: Secondary | ICD-10-CM

## 2021-10-16 DIAGNOSIS — E7849 Other hyperlipidemia: Secondary | ICD-10-CM | POA: Diagnosis not present

## 2021-10-16 DIAGNOSIS — Z6837 Body mass index (BMI) 37.0-37.9, adult: Secondary | ICD-10-CM

## 2021-10-16 DIAGNOSIS — R748 Abnormal levels of other serum enzymes: Secondary | ICD-10-CM | POA: Diagnosis not present

## 2021-10-16 MED ORDER — MAGNESIUM SALICYLATE 325 MG PO TABS: ORAL_TABLET | ORAL | 0 refills | Status: DC

## 2021-10-17 ENCOUNTER — Other Ambulatory Visit (HOSPITAL_COMMUNITY): Payer: Self-pay

## 2021-10-17 LAB — COMPREHENSIVE METABOLIC PANEL
ALT: 29 IU/L (ref 0–32)
AST: 22 IU/L (ref 0–40)
Albumin/Globulin Ratio: 1.3 (ref 1.2–2.2)
Albumin: 4 g/dL (ref 3.8–4.8)
Alkaline Phosphatase: 69 IU/L (ref 44–121)
BUN/Creatinine Ratio: 15 (ref 9–23)
BUN: 11 mg/dL (ref 6–24)
Bilirubin Total: 0.4 mg/dL (ref 0.0–1.2)
CO2: 21 mmol/L (ref 20–29)
Calcium: 9.8 mg/dL (ref 8.7–10.2)
Chloride: 104 mmol/L (ref 96–106)
Creatinine, Ser: 0.74 mg/dL (ref 0.57–1.00)
Globulin, Total: 3.2 g/dL (ref 1.5–4.5)
Glucose: 81 mg/dL (ref 70–99)
Potassium: 4.3 mmol/L (ref 3.5–5.2)
Sodium: 139 mmol/L (ref 134–144)
Total Protein: 7.2 g/dL (ref 6.0–8.5)
eGFR: 102 mL/min/{1.73_m2} (ref >59)

## 2021-10-17 LAB — CBC WITH DIFFERENTIAL/PLATELET
Basophils Absolute: 0 10*3/uL (ref 0.0–0.2)
Basos: 1 %
EOS (ABSOLUTE): 0.3 10*3/uL (ref 0.0–0.4)
Eos: 6 %
Hematocrit: 37.3 % (ref 34.0–46.6)
Hemoglobin: 12.5 g/dL (ref 11.1–15.9)
Immature Grans (Abs): 0 10*3/uL (ref 0.0–0.1)
Immature Granulocytes: 0 %
Lymphocytes Absolute: 1.6 10*3/uL (ref 0.7–3.1)
Lymphs: 29 %
MCH: 30 pg (ref 26.6–33.0)
MCHC: 33.5 g/dL (ref 31.5–35.7)
MCV: 89 fL (ref 79–97)
Monocytes Absolute: 0.4 10*3/uL (ref 0.1–0.9)
Monocytes: 8 %
Neutrophils Absolute: 3.1 10*3/uL (ref 1.4–7.0)
Neutrophils: 56 %
Platelets: 379 10*3/uL (ref 150–450)
RBC: 4.17 x10E6/uL (ref 3.77–5.28)
RDW: 13 % (ref 11.7–15.4)
WBC: 5.4 10*3/uL (ref 3.4–10.8)

## 2021-10-17 LAB — VITAMIN D 25 HYDROXY (VIT D DEFICIENCY, FRACTURES): Vit D, 25-Hydroxy: 96.3 ng/mL (ref 30.0–100.0)

## 2021-10-17 LAB — LIPID PANEL WITH LDL/HDL RATIO
Cholesterol, Total: 178 mg/dL (ref 100–199)
HDL: 53 mg/dL (ref >39)
LDL Chol Calc (NIH): 97 mg/dL (ref 0–99)
LDL/HDL Ratio: 1.8 ratio (ref 0.0–3.2)
Triglycerides: 165 mg/dL — ABNORMAL HIGH (ref 0–149)
VLDL Cholesterol Cal: 28 mg/dL (ref 5–40)

## 2021-10-17 LAB — HEMOGLOBIN A1C
Est. average glucose Bld gHb Est-mCnc: 108 mg/dL
Hgb A1c MFr Bld: 5.4 % (ref 4.8–5.6)

## 2021-10-17 LAB — C-REACTIVE PROTEIN: CRP: 9 mg/L (ref 0–10)

## 2021-10-17 LAB — INSULIN, RANDOM: INSULIN: 9.4 u[IU]/mL (ref 2.6–24.9)

## 2021-10-17 MED FILL — Etonogestrel-Ethinyl Estradiol VA Ring 0.12-0.015 MG/24HR: VAGINAL | 84 days supply | Qty: 3 | Fill #2 | Status: AC

## 2021-10-17 NOTE — Progress Notes (Signed)
Chief Complaint:   OBESITY April [name] is here to discuss her progress with her obesity treatment plan along with follow-up of her obesity related diagnoses. April [name] is on keeping a food journal and adhering to recommended goals of 1300-1400 calories and 90+ grams of protein daily and states she is following her eating plan approximately 50% of the time. April [name] states she is on the treadmill for 25 minutes 3 times per week.  Today's visit was #: 9 Starting weight: 263 lbs Starting date: 05/17/2021 Today's weight: 233 lbs Today's date: 10/16/2021 Total lbs lost to date: 30 Total lbs lost since last in-office visit: 6  Interim History: April [name] tracked over the holidays, and she made sure she continued to exercise. She tries to slowly increase the time she exercises. She did portion control and make smarter choices over Thanksgiving.  Subjective:   1. Rheumatoid arthritis, involving unspecified site, unspecified whether rheumatoid factor present (April [name]) April [name] is seeing Rheumatologist, Dr. Lenna [name] next week, and she requests additional labs in prep for that office visit since we are drawing labs today.  2. Other constipation April [name] has increased her Docusate to 100 mg BID recently due to more constipation. She is drinking 60-70 oz of water on average per day.  3. At risk for dehydration April [name] is at risk for dehydration due to inadequate water intake.  Assessment/Plan:   Orders Placed This Encounter  Procedures   CBC with Differential/Platelet   C-reactive protein    There are no discontinued medications.   Meds ordered this encounter  Medications   Magnesium Salicylate 725 MG TABS    Sig: Magnesium 325mg  via CALM supplement. OTC    Dispense:  30 tablet    Refill:  0     1. Rheumatoid arthritis, involving unspecified site, unspecified whether rheumatoid factor present (No Name) We will add CRP and CBD to April [name]'s existing lab orders today.  - CBC with  Differential/Platelet - C-reactive protein  2. Other constipation April [name] is ok to add CALM magnesium supplementation and/or miralax. She is to increase her water intake especially with increased activity.  - Magnesium Salicylate 366 MG TABS; Magnesium 325mg  via CALM supplement. OTC  Dispense: 30 tablet; Refill: 0  3. At risk for dehydration April [name] was given approximately 9 minutes dehydration prevention counseling today. April [name] is at risk for dehydration due to weight loss and current medication(s). She was encouraged to hydrate and monitor fluid status to avoid dehydration as well as weight loss plateaus.   4. Obesity with current BMI of 33.5 April [name] is currently in the action stage of change. As such, her goal is to continue with weight loss efforts. She has agreed to keeping a food journal and adhering to recommended goals of 1300-1400 calories and 90+ grams of protein daily.   Exercise goals: As is. April [name] will increase the intensity in which she exercises, such as increasing the incline on her treadmill and add 2 days per week when she uses weights.  Behavioral modification strategies: increasing lean protein intake, decreasing simple carbohydrates, holiday eating strategies , and avoiding temptations.  April [name] has agreed to follow-up with our clinic in 3 weeks. She was informed of the importance of frequent follow-up visits to maximize her success with intensive lifestyle modifications for her multiple health conditions.   April [name] was informed we would discuss her lab results at her next visit unless there is a critical issue that needs to be addressed sooner. April [name] agreed to keep her next visit at the agreed upon  time to discuss these results.  Objective:   Blood pressure 119/78, pulse 76, temperature 97.7 F (36.5 C), height 5\' 10"  (1.778 m), weight 233 lb (105.7 kg), SpO2 99 %. Body mass index is 33.43 kg/m.  General: Cooperative, alert, well developed, in no acute  distress. HEENT: Conjunctivae and lids unremarkable. Cardiovascular: Regular rhythm.  Lungs: Normal work of breathing. Neurologic: No focal deficits.   Lab Results  Component Value Date   CREATININE 0.74 10/16/2021   BUN 11 10/16/2021   NA 139 10/16/2021   K 4.3 10/16/2021   CL 104 10/16/2021   CO2 21 10/16/2021   Lab Results  Component Value Date   ALT 29 10/16/2021   AST 22 10/16/2021   ALKPHOS 69 10/16/2021   BILITOT 0.4 10/16/2021   Lab Results  Component Value Date   HGBA1C 5.4 10/16/2021   HGBA1C 5.7 (H) 05/17/2021   HGBA1C 5.4 05/26/2020   HGBA1C 5.5 05/26/2019   HGBA1C 5.6 05/22/2018   Lab Results  Component Value Date   INSULIN 9.4 10/16/2021   INSULIN 16.3 05/17/2021   Lab Results  Component Value Date   TSH 0.955 05/17/2021   Lab Results  Component Value Date   CHOL 178 10/16/2021   HDL 53 10/16/2021   LDLCALC 97 10/16/2021   LDLDIRECT 96.0 05/26/2019   TRIG 165 (H) 10/16/2021   CHOLHDL 3.1 05/26/2020   Lab Results  Component Value Date   VD25OH 96.3 10/16/2021   VD25OH 75.2 05/17/2021   VD25OH 59 05/26/2020   Lab Results  Component Value Date   WBC 5.4 10/16/2021   HGB 12.5 10/16/2021   HCT 37.3 10/16/2021   MCV 89 10/16/2021   PLT 379 10/16/2021   Lab Results  Component Value Date   IRON 82 05/30/2021   FERRITIN 290.9 05/30/2021   Attestation Statements:   Reviewed by clinician on day of visit: allergies, medications, problem list, medical history, surgical history, family history, social history, and previous encounter notes.   Wilhemena Durie, am acting as transcriptionist for [employer], DO.  I have reviewed the above documentation for accuracy and completeness, and I agree with the above. Marjory Sneddon, D.O.  The [place] was signed into law in 2016 which includes the topic of electronic health records.  This provides immediate access to information in MyChart.  This includes consultation notes,  operative notes, office notes, lab results and pathology reports.  If you have any questions about what you read please let us know at your next visit so we can discuss your concerns and take corrective action if need be.  We are right here with you.

## 2021-10-18 ENCOUNTER — Other Ambulatory Visit (HOSPITAL_COMMUNITY): Payer: Self-pay

## 2021-10-18 DIAGNOSIS — H04123 Dry eye syndrome of bilateral lacrimal glands: Secondary | ICD-10-CM | POA: Diagnosis not present

## 2021-10-18 DIAGNOSIS — H47333 Pseudopapilledema of optic disc, bilateral: Secondary | ICD-10-CM | POA: Diagnosis not present

## 2021-10-18 MED ORDER — KETOROLAC TROMETHAMINE 0.5 % OP SOLN
1.0000 [drp] | Freq: Three times a day (TID) | OPHTHALMIC | 1 refills | Status: DC
  Filled 2021-10-18: qty 5, 25d supply, fill #0

## 2021-10-19 ENCOUNTER — Other Ambulatory Visit (HOSPITAL_COMMUNITY): Payer: Self-pay

## 2021-10-22 ENCOUNTER — Other Ambulatory Visit (HOSPITAL_COMMUNITY): Payer: Self-pay

## 2021-10-23 ENCOUNTER — Other Ambulatory Visit (HOSPITAL_COMMUNITY): Payer: Self-pay

## 2021-10-23 DIAGNOSIS — Z79899 Other long term (current) drug therapy: Secondary | ICD-10-CM | POA: Diagnosis not present

## 2021-10-23 DIAGNOSIS — L409 Psoriasis, unspecified: Secondary | ICD-10-CM | POA: Diagnosis not present

## 2021-10-23 DIAGNOSIS — R5383 Other fatigue: Secondary | ICD-10-CM | POA: Diagnosis not present

## 2021-10-23 DIAGNOSIS — E669 Obesity, unspecified: Secondary | ICD-10-CM | POA: Diagnosis not present

## 2021-10-23 DIAGNOSIS — Z6835 Body mass index (BMI) 35.0-35.9, adult: Secondary | ICD-10-CM | POA: Diagnosis not present

## 2021-10-23 DIAGNOSIS — M0589 Other rheumatoid arthritis with rheumatoid factor of multiple sites: Secondary | ICD-10-CM | POA: Diagnosis not present

## 2021-10-23 DIAGNOSIS — H15101 Unspecified episcleritis, right eye: Secondary | ICD-10-CM | POA: Diagnosis not present

## 2021-10-23 DIAGNOSIS — M0579 Rheumatoid arthritis with rheumatoid factor of multiple sites without organ or systems involvement: Secondary | ICD-10-CM | POA: Diagnosis not present

## 2021-10-23 DIAGNOSIS — M2550 Pain in unspecified joint: Secondary | ICD-10-CM | POA: Diagnosis not present

## 2021-10-23 MED ORDER — METHOTREXATE SODIUM CHEMO INJECTION 50 MG/2ML
25.0000 mg | INTRAMUSCULAR | 0 refills | Status: DC
  Filled 2021-10-23: qty 12, 84d supply, fill #0

## 2021-10-23 MED ORDER — FOLIC ACID 1 MG PO TABS
1.0000 mg | ORAL_TABLET | Freq: Every day | ORAL | 4 refills | Status: DC
  Filled 2022-01-29: qty 90, 90d supply, fill #0
  Filled 2022-02-27 – 2022-04-29 (×2): qty 90, 90d supply, fill #1
  Filled 2022-08-26: qty 90, 90d supply, fill #2

## 2021-10-25 ENCOUNTER — Other Ambulatory Visit (HOSPITAL_COMMUNITY): Payer: Self-pay

## 2021-10-30 ENCOUNTER — Other Ambulatory Visit: Payer: Self-pay

## 2021-10-30 ENCOUNTER — Encounter: Payer: Self-pay | Admitting: Obstetrics & Gynecology

## 2021-10-30 ENCOUNTER — Other Ambulatory Visit (HOSPITAL_COMMUNITY): Payer: Self-pay

## 2021-10-30 ENCOUNTER — Ambulatory Visit (INDEPENDENT_AMBULATORY_CARE_PROVIDER_SITE_OTHER): Payer: 59 | Admitting: Obstetrics & Gynecology

## 2021-10-30 VITALS — BP 106/80 | HR 83 | Resp 16 | Ht 70.25 in | Wt 234.0 lb

## 2021-10-30 DIAGNOSIS — Z3044 Encounter for surveillance of vaginal ring hormonal contraceptive device: Secondary | ICD-10-CM

## 2021-10-30 DIAGNOSIS — E6609 Other obesity due to excess calories: Secondary | ICD-10-CM | POA: Diagnosis not present

## 2021-10-30 DIAGNOSIS — Z01419 Encounter for gynecological examination (general) (routine) without abnormal findings: Secondary | ICD-10-CM | POA: Diagnosis not present

## 2021-10-30 DIAGNOSIS — Z6833 Body mass index (BMI) 33.0-33.9, adult: Secondary | ICD-10-CM

## 2021-10-30 MED ORDER — ETONOGESTREL-ETHINYL ESTRADIOL 0.12-0.015 MG/24HR VA RING
VAGINAL_RING | VAGINAL | 4 refills | Status: DC
  Filled 2021-10-30 – 2022-01-19 (×2): qty 3, 84d supply, fill #0
  Filled 2022-04-29: qty 3, 84d supply, fill #1
  Filled 2022-07-29: qty 3, 84d supply, fill #2
  Filled 2022-10-16: qty 3, 84d supply, fill #3

## 2021-10-30 NOTE — Progress Notes (Signed)
April [name] 1976-08-14 [id number]   History:    45 y.o.  G0 Married. Moved into a new house.   RP:  Established patient presenting for annual gyn exam    HPI: Well on Nuvaring continuous use.  No BTB.  No pelvic pain.  Frequent sebaceous gland cysts/abcesses at vulva/upper inner legs and breast/axilla (Hidradenitis suppurativa) and Psoriasis followed by Dermato.  No pain with IC. Pap Neg 10/2020.  No previous abnormal Pap.  Breasts wnl.  Screening mammo Neg 11/2020, scheduled for this year.  BMI decreased to 33.34 (37.77 last year).  Exercising regularly.  Health labs with Fam MD.  Anxiety/Depression stable on Lexapro.   Past medical history,surgical history, family history and social history were all reviewed and documented in the EPIC chart.  Gynecologic History No LMP recorded. (Menstrual status: Other).  Obstetric History OB History  Gravida Para Term Preterm AB Living  0 0 0 0 0 0  SAB IAB Ectopic Multiple Live Births  0 0 0 0 0     ROS: A ROS was performed and pertinent positives and negatives are included in the history.  GENERAL: No fevers or chills. HEENT: No change in vision, no earache, sore throat or sinus congestion. NECK: No pain or stiffness. CARDIOVASCULAR: No chest pain or pressure. No palpitations. PULMONARY: No shortness of breath, cough or wheeze. GASTROINTESTINAL: No abdominal pain, nausea, vomiting or diarrhea, melena or bright red blood per rectum. GENITOURINARY: No urinary frequency, urgency, hesitancy or dysuria. MUSCULOSKELETAL: No joint or muscle pain, no back pain, no recent trauma. DERMATOLOGIC: No rash, no itching, no lesions. ENDOCRINE: No polyuria, polydipsia, no heat or cold intolerance. No recent change in weight. HEMATOLOGICAL: No anemia or easy bruising or bleeding. NEUROLOGIC: No headache, seizures, numbness, tingling or weakness. PSYCHIATRIC: No depression, no loss of interest in normal activity or change in sleep pattern.     Exam:   BP  106/80    Pulse 83    Resp 16    Ht 5' 10.25" (1.784 m)    Wt 234 lb (106.1 kg)    BMI 33.34 kg/m   Body mass index is 33.34 kg/m.  General appearance : Well developed well nourished female. No acute distress HEENT: Eyes: no retinal hemorrhage or exudates,  Neck supple, trachea midline, no carotid bruits, no thyroidmegaly Lungs: Clear to auscultation, no rhonchi or wheezes, or rib retractions  Heart: Regular rate and rhythm, no murmurs or gallops Breast:Examined in sitting and supine position were symmetrical in appearance, no palpable masses or tenderness,  no skin retraction, no nipple inversion, no nipple discharge, no skin discoloration, no axillary or supraclavicular lymphadenopathy Abdomen: no palpable masses or tenderness, no rebound or guarding Extremities: no edema or skin discoloration or tenderness  Pelvic: Vulva: Normal             Vagina: No gross lesions or discharge  Cervix: No gross lesions or discharge  Uterus  AV, normal size, shape and consistency, non-tender and mobile  Adnexa  Without masses or tenderness  Anus: Normal   Assessment/Plan:  45 y.o. female for annual exam   1. Well female exam with routine gynecological exam Well on Nuvaring continuous use.  No BTB.  No pelvic pain.  Frequent sebaceous gland cysts/abcesses at vulva/upper inner legs and breast/axilla (Hidradenitis suppurativa) and Psoriasis followed by Dermato.  No pain with IC. Pap Neg 10/2020.  No previous abnormal Pap.  Breasts wnl.  Screening mammo Neg 11/2020, scheduled for this year.  BMI  decreased to 33.34 (37.77 last year).  Exercising regularly.  Health labs with Fam MD.  Anxiety/Depression stable on Lexapro.  2. Encounter for surveillance of vaginal ring hormonal contraceptive device Well on Nuvaring continuous use.  No BTB.  No CI to continue.  Prescription sent to pharmacy.  3. Class 1 obesity due to excess calories without serious comorbidity with body mass index (BMI) of 33.0 to 33.9 in  adult Successfully loosing weight.  Continue low calorie/carb diet.  Continue with fitness.  Other orders - etonogestrel-ethinyl estradiol (NUVARING) 0.12-0.015 MG/24HR vaginal ring; REMOVE AND INSERT A NEW RING INTO THE VAGINA EVERY 4 WEEKS *USE CONTINUOUSLY*   Princess Bruins MD, 9:12 AM 10/30/2021

## 2021-11-05 ENCOUNTER — Other Ambulatory Visit: Payer: Self-pay

## 2021-11-05 ENCOUNTER — Encounter (INDEPENDENT_AMBULATORY_CARE_PROVIDER_SITE_OTHER): Payer: Self-pay | Admitting: Family Medicine

## 2021-11-05 ENCOUNTER — Ambulatory Visit (INDEPENDENT_AMBULATORY_CARE_PROVIDER_SITE_OTHER): Payer: 59 | Admitting: Family Medicine

## 2021-11-05 VITALS — BP 128/72 | HR 68 | Temp 97.6°F | Ht 70.0 in | Wt 232.0 lb

## 2021-11-05 DIAGNOSIS — Z6837 Body mass index (BMI) 37.0-37.9, adult: Secondary | ICD-10-CM | POA: Diagnosis not present

## 2021-11-05 DIAGNOSIS — R7303 Prediabetes: Secondary | ICD-10-CM

## 2021-11-05 DIAGNOSIS — K5909 Other constipation: Secondary | ICD-10-CM | POA: Diagnosis not present

## 2021-11-05 DIAGNOSIS — E559 Vitamin D deficiency, unspecified: Secondary | ICD-10-CM | POA: Diagnosis not present

## 2021-11-06 NOTE — Progress Notes (Signed)
Chief Complaint:   OBESITY April [name] is here to discuss her progress with her obesity treatment plan along with follow-up of her obesity related diagnoses. April [name] is on keeping a food journal and adhering to recommended goals of 1300-1400 calories and 90+ grams protein and states she is following her eating plan approximately 40% of the time. April [name] states she is doing cardio/exercise videos 30 minutes 4 times per week.  Today's visit was #: 10 Starting weight: 263 lbs Starting date: 05/17/2021 Today's weight: 232 lbs Today's date: 11/05/2021 Total lbs lost to date: 31 Total lbs lost since last in-office visit: 1  Interim History: Pt is going over in calories 60% of the time and hits protein about 50-60% of the time. She gets hungry between lunch and dinner (afternoons).  Subjective:   1. Prediabetes Even though she has hunger, pt declines meds at this time and wishes to work on hitting her calorie and protein goals more often on a regular basis.  2. Other constipation Pt is on docusate BID and symptoms have normalized somewhat. She has a BM every other day now. Pt denies concerns. She never took the CALM supplement and is still lacking on water intake.   3. Vitamin D deficiency She is currently taking OTC vitamin D 5,000 IU each day. She denies nausea, vomiting or muscle weakness.  Assessment/Plan:  No orders of the defined types were placed in this encounter.   There are no discontinued medications.   No orders of the defined types were placed in this encounter.    1. Prediabetes April [name] will continue to work on weight loss, exercise, and decreasing simple carbohydrates to help decrease the risk of diabetes. She declines meds today. Prudent nutritional plan, decrease simple sugars, and increase protein.  2. Other constipation Continue current treatment plan. Increase water intake to 1/2 her weight in ounces per day. Take magnesium supplement as discussed along with  Dulcolax and Miralax prn.  3. Vitamin D deficiency Low Vitamin D level contributes to fatigue and are associated with obesity, breast, and colon cancer. She agrees to continue to take OTC Vitamin D 5,000 IU daily and will follow-up for routine testing of Vitamin D, at least 2-3 times per year to avoid over-replacement.  4. Obesity with current BMI of 33.4  April [name] is currently in the action stage of change. As such, her goal is to continue with weight loss efforts. She has agreed to keeping a food journal and adhering to recommended goals of 1300-1400 calories and 90+ grams protein.   Come fasting for repeat IC to next OV. Arrive 30 minutes prior to appt. Goal is 30 minutes of exercise 4 days a week for the next OV. Goal is to avoid the sweets as much as possible for next OV.  Exercise goals: For substantial health benefits, adults should do at least 150 minutes (2 hours and 30 minutes) a week of moderate-intensity, or 75 minutes (1 hour and 15 minutes) a week of vigorous-intensity aerobic physical activity, or an equivalent combination of moderate- and vigorous-intensity aerobic activity. Aerobic activity should be performed in episodes of at least 10 minutes, and preferably, it should be spread throughout the week.  Behavioral modification strategies: holiday eating strategies , celebration eating strategies, and planning for success.  April [name] has agreed to follow-up with our clinic in 3 weeks. She was informed of the importance of frequent follow-up visits to maximize her success with intensive lifestyle modifications for her multiple health conditions.   Objective:  Blood pressure 128/72, pulse 68, temperature 97.6 F (36.4 C), temperature source Oral, height 5\' 10"  (1.778 m), weight 232 lb (105.2 kg), SpO2 99 %. Body mass index is 33.29 kg/m.  General: Cooperative, alert, well developed, in no acute distress. HEENT: Conjunctivae and lids unremarkable. Cardiovascular: Regular rhythm.   Lungs: Normal work of breathing. Neurologic: No focal deficits.   Lab Results  Component Value Date   CREATININE 0.74 10/16/2021   BUN 11 10/16/2021   NA 139 10/16/2021   K 4.3 10/16/2021   CL 104 10/16/2021   CO2 21 10/16/2021   Lab Results  Component Value Date   ALT 29 10/16/2021   AST 22 10/16/2021   ALKPHOS 69 10/16/2021   BILITOT 0.4 10/16/2021   Lab Results  Component Value Date   HGBA1C 5.4 10/16/2021   HGBA1C 5.7 (H) 05/17/2021   HGBA1C 5.4 05/26/2020   HGBA1C 5.5 05/26/2019   HGBA1C 5.6 05/22/2018   Lab Results  Component Value Date   INSULIN 9.4 10/16/2021   INSULIN 16.3 05/17/2021   Lab Results  Component Value Date   TSH 0.955 05/17/2021   Lab Results  Component Value Date   CHOL 178 10/16/2021   HDL 53 10/16/2021   LDLCALC 97 10/16/2021   LDLDIRECT 96.0 05/26/2019   TRIG 165 (H) 10/16/2021   CHOLHDL 3.1 05/26/2020   Lab Results  Component Value Date   VD25OH 96.3 10/16/2021   VD25OH 75.2 05/17/2021   VD25OH 59 05/26/2020   Lab Results  Component Value Date   WBC 5.4 10/16/2021   HGB 12.5 10/16/2021   HCT 37.3 10/16/2021   MCV 89 10/16/2021   PLT 379 10/16/2021   Lab Results  Component Value Date   IRON 82 05/30/2021   FERRITIN 290.9 05/30/2021    Attestation Statements:   Reviewed by clinician on day of visit: allergies, medications, problem list, medical history, surgical history, family history, social history, and previous encounter notes.  Time spent on visit including pre-visit chart review and post-visit care and charting was 30 minutes.   Coral Ceo, CMA, am acting as transcriptionist for [employer], DO.  I have reviewed the above documentation for accuracy and completeness, and I agree with the above. Marjory Sneddon, D.O.  The [place] was signed into law in 2016 which includes the topic of electronic health records.  This provides immediate access to information in MyChart.  This  includes consultation notes, operative notes, office notes, lab results and pathology reports.  If you have any questions about what you read please let us know at your next visit so we can discuss your concerns and take corrective action if need be.  We are right here with you.

## 2021-11-13 NOTE — Progress Notes (Signed)
April [name] [address] [place] Phone: [phone] Subjective:   IVilma [name], am serving as a scribe for Dr. Hulan Saas. This visit occurred during the SARS-CoV-2 public health emergency.  Safety protocols were in place, including screening questions prior to the visit, additional usage of staff PPE, and extensive cleaning of exam room while observing appropriate contact time as indicated for disinfecting solutions.   I'm seeing this patient by the request  of:  Janith Lima, MD  CC: neck and back  pain   EQA:[id number]  April [name] is a 45 y.o. female coming in with complaint of back and neck pain. OMT on 10/04/2021 with Dr. Glennon Mac. Patient states everything remains the same. No new complaints.  Patient continues to have tightness.  Wanting to know about long-term management of the sacrum.  Does feel like she does get better but unfortunately pain does come back relatively quickly after the manipulation.  Medications patient has been prescribed: None  Taking:         Reviewed prior external information including notes and imaging from previsou exam, outside providers and external EMR if available.   As well as notes that were available from care everywhere and other healthcare systems.  Past medical history, social, surgical and family history all reviewed in electronic medical record.  No pertanent information unless stated regarding to the chief complaint.   Past Medical History:  Diagnosis Date   ALLERGIC RHINITIS    Anal fissure    Anxiety    ANXIETY DEPRESSION    Back pain    CONSTIPATION    Constipation    DEPRESSION    Esophageal reflux    Food allergy    Avacados   Gastritis    GERD (gastroesophageal reflux disease)    HYPERCHOLESTEROLEMIA    Hyperlipidemia    Hypertension    OBESITY    Occipital neuralgia    Palpitations    Rheumatoid arthritis ([place]) 12/24/2017    Allergies  Allergen  Reactions   Ciprofloxacin     toxicity   Levaquin [Levofloxacin] Anxiety    Possible CNS side effects. TOXICITY   Moxifloxacin     TOXICITY   Avocado     Other reaction(s): Unknown     Review of Systems:  No headache, visual changes, nausea, vomiting, diarrhea, constipation, dizziness, abdominal pain, skin rash, fevers, chills, night sweats, weight loss, swollen lymph nodes, body aches, joint swelling, chest pain, shortness of breath, mood changes. POSITIVE muscle aches  Objective  Blood pressure 110/64, pulse 80, height 5\' 10"  (1.778 m), weight 234 lb (106.1 kg), SpO2 99 %.   General: No apparent distress alert and oriented x3 mood and affect normal, dressed appropriately.  HEENT: Pupils equal, extraocular movements intact  Respiratory: Patient's speak in full sentences and does not appear short of breath  Cardiovascular: No lower extremity edema, non tender, no erythema  Neuro: Cranial nerves II through XII are intact, neurovascularly intact in all extremities with 2+ DTRs and 2+ pulses.  Gait normal with good balance and coordination.  MSK:  Non tender with full range of motion and good stability and symmetric strength and tone of shoulders, elbows, wrist, hip, knee and ankles bilaterally.  Back - Normal skin, Spine with normal alignment and no deformity.  No tenderness to vertebral process palpation.  Paraspinous muscles are not tender and without spasm.   Range of motion is full at neck and lumbar sacral regions  Osteopathic  findings  C2 flexed rotated and side bent right C7 flexed rotated and side bent left T3 extended rotated and side bent right inhaled rib T8 extended rotated and side bent left L2 flexed rotated and side bent right Sacrum right on right       Assessment and Plan:  SI (sacroiliac) joint dysfunction Continues to give her difficult problem with exacerbation.  Patient does have the underlying rheumatoid arthritis that I think does not contribute as  well.  We did discuss the long-term ramifications of this and we may want to consider the possibility of a fusion at some point if this continues to give her more trouble.  I am hoping the patient will do relatively well.  Follow-up with me again 4 to 8 weeks.    Nonallopathic problems  Decision today to treat with OMT was based on Physical Exam  After verbal consent patient was treated with HVLA, ME, FPR techniques in cervical, rib, thoracic, lumbar, and sacral  areas  Patient tolerated the procedure well with improvement in symptoms  Patient given exercises, stretches and lifestyle modifications  See medications in patient instructions if given  Patient will follow up in 4-8 weeks      The above documentation has been reviewed and is accurate and complete Lyndal Pulley, DO        Note: This dictation was prepared with Dragon dictation along with smaller phrase technology. Any transcriptional errors that result from this process are unintentional.

## 2021-11-14 ENCOUNTER — Ambulatory Visit: Payer: 59 | Admitting: Family Medicine

## 2021-11-14 ENCOUNTER — Other Ambulatory Visit: Payer: Self-pay

## 2021-11-14 VITALS — BP 110/64 | HR 80 | Ht 70.0 in | Wt 234.0 lb

## 2021-11-14 DIAGNOSIS — M9902 Segmental and somatic dysfunction of thoracic region: Secondary | ICD-10-CM | POA: Diagnosis not present

## 2021-11-14 DIAGNOSIS — M9901 Segmental and somatic dysfunction of cervical region: Secondary | ICD-10-CM | POA: Diagnosis not present

## 2021-11-14 DIAGNOSIS — M9904 Segmental and somatic dysfunction of sacral region: Secondary | ICD-10-CM

## 2021-11-14 DIAGNOSIS — M533 Sacrococcygeal disorders, not elsewhere classified: Secondary | ICD-10-CM

## 2021-11-14 DIAGNOSIS — M9908 Segmental and somatic dysfunction of rib cage: Secondary | ICD-10-CM | POA: Diagnosis not present

## 2021-11-14 DIAGNOSIS — M9903 Segmental and somatic dysfunction of lumbar region: Secondary | ICD-10-CM

## 2021-11-14 NOTE — Assessment & Plan Note (Signed)
Continues to give her difficult problem with exacerbation.  Patient does have the underlying rheumatoid arthritis that I think does not contribute as well.  We did discuss the long-term ramifications of this and we may want to consider the possibility of a fusion at some point if this continues to give her more trouble.  I am hoping the patient will do relatively well.  Follow-up with me again 4 to 8 weeks.

## 2021-11-14 NOTE — Patient Instructions (Signed)
Good to see you! Have wonderful and eventful New Years See you again in 6 weeks

## 2021-11-15 ENCOUNTER — Other Ambulatory Visit (HOSPITAL_COMMUNITY): Payer: Self-pay

## 2021-11-20 ENCOUNTER — Other Ambulatory Visit (HOSPITAL_COMMUNITY): Payer: Self-pay

## 2021-11-21 ENCOUNTER — Other Ambulatory Visit (HOSPITAL_COMMUNITY): Payer: Self-pay

## 2021-12-05 ENCOUNTER — Other Ambulatory Visit (HOSPITAL_COMMUNITY): Payer: Self-pay

## 2021-12-05 ENCOUNTER — Other Ambulatory Visit: Payer: Self-pay

## 2021-12-05 ENCOUNTER — Encounter: Payer: Self-pay | Admitting: Internal Medicine

## 2021-12-05 ENCOUNTER — Other Ambulatory Visit: Payer: Self-pay | Admitting: Internal Medicine

## 2021-12-05 ENCOUNTER — Ambulatory Visit: Payer: 59 | Admitting: Internal Medicine

## 2021-12-05 VITALS — BP 128/78 | HR 70 | Temp 97.8°F | Resp 16 | Ht 70.0 in | Wt 230.0 lb

## 2021-12-05 DIAGNOSIS — I10 Essential (primary) hypertension: Secondary | ICD-10-CM

## 2021-12-05 DIAGNOSIS — L02211 Cutaneous abscess of abdominal wall: Secondary | ICD-10-CM

## 2021-12-05 MED ORDER — RIFAMPIN 300 MG PO CAPS
300.0000 mg | ORAL_CAPSULE | Freq: Two times a day (BID) | ORAL | 0 refills | Status: AC
  Filled 2021-12-05: qty 14, 7d supply, fill #0

## 2021-12-05 MED ORDER — LOSARTAN POTASSIUM 50 MG PO TABS
50.0000 mg | ORAL_TABLET | Freq: Every day | ORAL | 1 refills | Status: DC
  Filled 2021-12-05: qty 90, 90d supply, fill #0
  Filled 2022-03-03: qty 90, 90d supply, fill #1

## 2021-12-05 MED ORDER — SULFAMETHOXAZOLE-TRIMETHOPRIM 800-160 MG PO TABS
1.0000 | ORAL_TABLET | Freq: Two times a day (BID) | ORAL | 0 refills | Status: AC
  Filled 2021-12-05: qty 14, 7d supply, fill #0

## 2021-12-05 NOTE — Progress Notes (Signed)
Subjective:  [name], female    DOB: [birth year]  Age: 46 y.o. MRN: [id number]  CC: Recurrent Skin Infections  This visit occurred during the SARS-CoV-2 public health emergency.  Safety protocols were in place, including screening questions prior to the visit, additional usage of staff PPE, and extensive cleaning of exam room while observing appropriate contact time as indicated for disinfecting solutions.    HPI April [name] presents for f/up -  She complains of a 10 day hx of red, painful, swollen area in her lower abdomen. It has intermittently drained a purulent fluid.  Outpatient Medications Prior to Visit  Medication Sig Dispense Refill   ascorbic acid (VITAMIN C) 1000 MG tablet Take 1,000 mg by mouth daily.     busPIRone (BUSPAR) 15 MG tablet Take 1 tablet (15 mg total) by mouth 2 (two) times daily. (Patient taking differently: Take 15 mg by mouth. Bid as needed) 180 tablet 1   celecoxib (CELEBREX) 200 MG capsule Take 1 capsule (200 mg total) by mouth 2 (two) times daily with food. (Patient taking differently: Take 200 mg by mouth 2 (two) times daily as needed.) 60 capsule 2   Certolizumab Pegol (CIMZIA PREFILLED) 2 X 200 MG/ML PSKT Use 1 injection subcutaneously every other week 2 each 6   cetirizine (ZYRTEC) 10 MG tablet Take 1 tablet (10 mg total) by mouth daily. 30 tablet 3   Cholecalciferol (VITAMIN D3) 125 MCG (5000 UT) CAPS Take 1 capsule by mouth daily.     clindamycin (CLEOCIN T) 1 % external solution Apply a small amount to the affected area(s) once daily after you shower. 60 mL 3   clobetasol (TEMOVATE) 0.05 % external solution Apply a small amount to scalp once daily as needed 50 mL 1   clobetasol ointment (TEMOVATE) 0.05 % APPLY TO AFFECTED AREA TWICE DAILY FOR 10 DAYS  0   escitalopram (LEXAPRO) 20 MG tablet Take 1 tablet (20 mg total) by mouth daily. 90 tablet 1   esomeprazole (NEXIUM) 40 MG capsule Take 1 capsule (40 mg total) by mouth  daily. 90 capsule 3   etonogestrel-ethinyl estradiol (NUVARING) 0.12-0.015 MG/24HR vaginal ring REMOVE AND INSERT A NEW RING INTO THE VAGINA EVERY 4 WEEKS *USE CONTINUOUSLY* 3 each 4   famotidine (PEPCID) 20 MG tablet Take 20 mg by mouth at bedtime. Take 1 tablets by mouth daily at bedtime     fenofibrate 160 MG tablet Take 1 tablet (160 mg total) by mouth daily. 90 tablet 3   folic acid (FOLVITE) 1 MG tablet Take 1 tablet (1 mg total) by mouth daily. 90 tablet 4   gabapentin (NEURONTIN) 300 MG capsule Take 1 capsule (300 mg total) by mouth daily. 90 capsule 1   ibuprofen (ADVIL) 800 MG tablet Take 1 tablet (800 mg total) by mouth every 8 (eight) hours as needed. 90 tablet 0   ketorolac (ACULAR) 0.5 % ophthalmic solution Place 1 drop into the right eye 3 (three) times daily for redness/irriation as needed 5 mL 1   MAGNESIUM MALATE PO Take 1 tablet by mouth at bedtime.     methotrexate 50 MG/2ML injection Inject 1 mL (25 mg total) into the skin once a week. 12 mL 0   montelukast (SINGULAIR) 10 MG tablet Take 1 tablet (10 mg total) by mouth at bedtime. 90 tablet 1   Multiple Vitamin (MULTIVITAMIN) tablet Take 1 tablet by mouth daily.     Omega-3 Fatty Acids (FISH OIL) 1000 MG  CAPS Take 2 capsules by mouth 2 (two) times daily.      zolpidem (AMBIEN) 5 MG tablet Take 1 tablet (5 mg total) by mouth at bedtime as needed for sleep. 90 tablet 1   Ibuprofen-Famotidine (DUEXIS) 800-26.6 MG TABS Take 800 mg by mouth 3 (three) times daily. (Patient taking differently: Take 800 mg by mouth 3 (three) times daily as needed.) 90 tablet 3   losartan (COZAAR) 50 MG tablet Take 1 tablet (50 mg total) by mouth daily. 90 tablet 1   No facility-administered medications prior to visit.    ROS Review of Systems  Constitutional:  Negative for chills, diaphoresis, fatigue and fever.  HENT: Negative.    Eyes: Negative.   Respiratory: Negative.  Negative for cough, wheezing and stridor.   Cardiovascular:  Negative for  chest pain, palpitations and leg swelling.  Gastrointestinal:  Negative for abdominal pain, diarrhea and nausea.  Endocrine: Negative.   Genitourinary: Negative.  Negative for difficulty urinating.  Musculoskeletal: Negative.   Skin:  Positive for color change. Negative for rash.  Neurological: Negative.   Hematological:  Negative for adenopathy. Does not bruise/bleed easily.  Psychiatric/Behavioral: Negative.     Objective:  BP 128/78 (BP Location: Right Arm, Patient Position: Sitting, Cuff Size: Large)    Pulse 70    Temp 97.8 F (36.6 C) (Oral)    Resp 16    Ht 5\' 10"  (1.778 m)    Wt 230 lb (104.3 kg)    SpO2 97%    BMI 33.00 kg/m   BP Readings from Last 3 Encounters:  12/06/21 127/78  12/05/21 128/78  11/14/21 110/64    Wt Readings from Last 3 Encounters:  12/06/21 224 lb (101.6 kg)  12/05/21 230 lb (104.3 kg)  11/14/21 234 lb (106.1 kg)    Physical Exam Vitals reviewed.  Constitutional:      Appearance: She is not ill-appearing.  HENT:     Nose: Nose normal.     Mouth/Throat:     Mouth: Mucous membranes are moist.  Eyes:     Conjunctiva/sclera: Conjunctivae normal.  Cardiovascular:     Rate and Rhythm: Normal rate and regular rhythm.     Heart sounds: No murmur heard. Pulmonary:     Effort: Pulmonary effort is normal.     Breath sounds: No stridor. No wheezing, rhonchi or rales.  Abdominal:     General: Abdomen is flat.     Palpations: There is no mass.     Tenderness: There is no abdominal tenderness. There is no guarding.     Hernia: No hernia is present.    Musculoskeletal:     Cervical back: Neck supple.  Lymphadenopathy:     Cervical: No cervical adenopathy.  Skin:    General: Skin is warm.     Findings: No rash.  Neurological:     General: No focal deficit present.  Psychiatric:        Mood and Affect: Mood normal.        Behavior: Behavior normal.    Lab Results  Component Value Date   WBC 5.4 10/16/2021   HGB 12.5 10/16/2021   HCT 37.3  10/16/2021   PLT 379 10/16/2021   GLUCOSE 81 10/16/2021   CHOL 178 10/16/2021   TRIG 165 (H) 10/16/2021   HDL 53 10/16/2021   LDLDIRECT 96.0 05/26/2019   LDLCALC 97 10/16/2021   ALT 29 10/16/2021   AST 22 10/16/2021   NA 139 10/16/2021   K 4.3 10/16/2021  CL 104 10/16/2021   CREATININE 0.74 10/16/2021   BUN 11 10/16/2021   CO2 21 10/16/2021   TSH 0.955 05/17/2021   HGBA1C 5.4 10/16/2021    US Abdomen Limited RUQ (LIVER/GB)  Result Date: 06/29/2021 CLINICAL DATA:  Elevated LFTs EXAM: ULTRASOUND ABDOMEN LIMITED RIGHT UPPER QUADRANT COMPARISON:  None. FINDINGS: Gallbladder: No gallstones or wall thickening visualized. No sonographic Murphy sign noted by sonographer. Common bile duct: Diameter: 0.3 cm Liver: No focal lesion identified. Within normal limits in parenchymal echogenicity. Portal vein is patent on color Doppler imaging with normal direction of blood flow towards the liver. Other: None. IMPRESSION: Normal ultrasound of the right upper quadrant. Electronically Signed   By: Merilyn Baba M.D.   On: 06/29/2021 13:19  Status: FINAL   Gram Stain: Moderate White blood cells seen No epithelial cells seen No organisms seen   Isolate 1: Staphylococcus aureus Abnormal    Comment: Moderate growth of Staphylococcus aureus  Resulting Agency [facility]     Susceptibility   Staphylococcus aureus    AEROBIC CULT, GRAM STAIN POSITIVE 1    CIPROFLOXACIN <=0.5  Sensitive    CLINDAMYCIN >=8  Resistant    ERYTHROMYCIN <=0.25  Sensitive    GENTAMICIN <=0.5  Sensitive    LEVOFLOXACIN <=0.12  Sensitive    OXACILLIN 0.5  Sensitive 1    TETRACYCLINE <=1  Sensitive    TRIMETH/SULFA <=10  Sensitive 2    VANCOMYCIN 1  Sensitive                  After informed verbal consent was obtained. Using Betadine for cleansing and 2% Lidocaine with epinephrine for anesthetic (3 cc's used), with sterile technique a 5 mm punch incision was made and a small cavity was found with scant  amount of exudate. The cavity was cultured and irrigated with H2O2 and Qtips. No deep tracking or loculations were found. The cavity was packed with iodoform. Hemostasis was obtained by pressure. The specimen is labeled and sent. The procedure was well tolerated without complications.   Assessment & Plan:   Gerda was seen today for recurrent skin infections.  Diagnoses and all orders for this visit:  Abscess of skin of abdomen- Incision and drainage completed.  The culture is positive for staph.  Will treat with Bactrim and rifampin.  She agrees to remove the packing in 48 hours.  She will follow-up with me if there are concerns. -     WOUND CULTURE; Future -     sulfamethoxazole-trimethoprim (BACTRIM DS) 800-160 MG tablet; Take 1 tablet by mouth 2 (two) times daily for 7 days. -     rifampin (RIFADIN) 300 MG capsule; Take 1 capsule (300 mg total) by mouth 2 (two) times daily for 7 days. -     WOUND CULTURE  Primary hypertension- Her blood pressure is adequately well controlled. -     losartan (COZAAR) 50 MG tablet; Take 1 tablet (50 mg total) by mouth daily.   I have discontinued Onya S. Santee's Duexis. I am also having her start on sulfamethoxazole-trimethoprim and rifampin. Additionally, I am having her maintain her multivitamin, cetirizine, Fish Oil, ascorbic acid, clobetasol ointment, Vitamin D3, MAGNESIUM MALATE PO, celecoxib, busPIRone, zolpidem, ibuprofen, esomeprazole, fenofibrate, Cimzia Prefilled, escitalopram, montelukast, gabapentin, clobetasol, clindamycin, famotidine, ketorolac, folic acid, methotrexate, etonogestrel-ethinyl estradiol, and losartan.  Meds ordered this encounter  Medications   losartan (COZAAR) 50 MG tablet    Sig: Take 1 tablet (50 mg total) by mouth daily.  Dispense:  90 tablet    Refill:  1   sulfamethoxazole-trimethoprim (BACTRIM DS) 800-160 MG tablet    Sig: Take 1 tablet by mouth 2 (two) times daily for 7 days.    Dispense:  14 tablet     Refill:  0   rifampin (RIFADIN) 300 MG capsule    Sig: Take 1 capsule (300 mg total) by mouth 2 (two) times daily for 7 days.    Dispense:  14 capsule    Refill:  0     Follow-up: Return in about 2 days (around 12/07/2021).  Scarlette Calico, MD

## 2021-12-05 NOTE — Patient Instructions (Signed)
Incision and Drainage Incision and drainage is a surgical procedure to open and drain a fluid-filled sac. The sac may be filled with pus, mucus, or blood. Examples of fluid-filled sacs that may need surgical drainage include cysts, skin infections (abscesses), and red lumps that develop from a ruptured cyst or a small abscess (boils). You may need this procedure if the affected area is large, painful, infected, or not healing well. Tell a health care provider about: Any allergies you have. All medicines you are taking, including vitamins, herbs, eye drops, creams, and over-the-counter medicines. Any problems you or family members have had with anesthetic medicines. Any blood disorders you have or have had. Any surgeries you have had. Any medical conditions you have or have had. Whether you are pregnant or may be pregnant. What are the risks? Generally, this is a safe procedure. However, problems may occur, including: Infection. Bleeding. Allergic reactions to medicines. Scarring. The cyst or abscess returns. Damage to nerves or vessels. What happens before the procedure? Medicine Ask your health care provider about: Changing or stopping your regular medicines. This is especially important if you are taking diabetes medicines or blood thinners. Taking medicines such as aspirin and ibuprofen. These medicines can thin your blood. Do not take these medicines unless your health care provider tells you to take them. Taking over-the-counter medicines, vitamins, herbs, and supplements. Tests You may have an exam or testing. These may include: Ultrasound or other imaging tests to see how large or deep the fluid-filled sac is. Blood tests to check for infection. General instructions Follow instructions from your health care provider about eating or drinking restrictions. Plan to have someone take you home from the hospital or clinic. Ask your health care provider whether a responsible adult  should care for you for at least 24 hours after you leave the hospital or clinic. This is important. You may get a tetanus shot. Ask your health care provider: How your surgery site will be marked or identified. What steps will be taken to help prevent infection. These may include: Removing hair at the surgery site. Washing skin with a germ-killing soap. Receiving antibiotic medicine. What happens during the procedure?  An IV may be inserted into one of your veins. You will be given one or more of the following: A medicine to help you relax (sedative). A medicine to numb the area (local anesthetic). A medicine to make you fall asleep (general anesthetic). An incision will be made in the top of the fluid-filled sac. Pus, blood, and mucus will be squeezed out, and a syringe or tube (drain) may be used to empty more fluid from the sac. Your health care provider will do one of the following. He or she may: Leave the drain in place for several weeks to drain more fluid. Stitch open the edges of the incision to make a long-term opening for drainage (marsupialization). The inside of the sac may be washed out (irrigated) with a sterile solution and packed with gauze before it is covered with a bandage (dressing). Your health care provider do a culture test of the drainage fluid. The procedure may vary among health care providers and hospitals. What happens after the procedure? Your blood pressure, heart rate, breathing rate, and blood oxygen level will be monitored often until you leave the hospital or clinic. Do not drive for 24 hours if you were given a sedative during your procedure. Summary Incision and drainage is a surgical procedure to open and drain a fluid-filled sac.  The sac may be filled with pus, mucus, or blood. Before the procedure, you may be given antibiotic medicine to treat or help prevent infection. During the procedure, an incision will be made in the top of the fluid-filled  sac. Pus, blood, and mucus is squeezed out, and a syringe or tube (drain) may be used to empty more fluid from the sac. The inside of the sac may be washed out (irrigated) with a sterile solution and packed with gauze before it is covered with a bandage (dressing). This information is not intended to replace advice given to you by your health care provider. Make sure you discuss any questions you have with your health care provider. Document Revised: 10/05/2018 Document Reviewed: 10/05/2018 Elsevier Patient Education  2022 [employer].

## 2021-12-06 ENCOUNTER — Ambulatory Visit (INDEPENDENT_AMBULATORY_CARE_PROVIDER_SITE_OTHER): Payer: 59 | Admitting: Family Medicine

## 2021-12-06 ENCOUNTER — Encounter (INDEPENDENT_AMBULATORY_CARE_PROVIDER_SITE_OTHER): Payer: Self-pay | Admitting: Family Medicine

## 2021-12-06 VITALS — BP 127/78 | HR 83 | Temp 98.5°F | Ht 70.0 in | Wt 224.0 lb

## 2021-12-06 DIAGNOSIS — E66812 Obesity, class 2: Secondary | ICD-10-CM

## 2021-12-06 DIAGNOSIS — R0602 Shortness of breath: Secondary | ICD-10-CM

## 2021-12-06 DIAGNOSIS — Z6832 Body mass index (BMI) 32.0-32.9, adult: Secondary | ICD-10-CM | POA: Diagnosis not present

## 2021-12-06 DIAGNOSIS — R7303 Prediabetes: Secondary | ICD-10-CM | POA: Diagnosis not present

## 2021-12-06 DIAGNOSIS — E781 Pure hyperglyceridemia: Secondary | ICD-10-CM

## 2021-12-06 DIAGNOSIS — E669 Obesity, unspecified: Secondary | ICD-10-CM | POA: Diagnosis not present

## 2021-12-06 DIAGNOSIS — Z6837 Body mass index (BMI) 37.0-37.9, adult: Secondary | ICD-10-CM

## 2021-12-08 DIAGNOSIS — Z1231 Encounter for screening mammogram for malignant neoplasm of breast: Secondary | ICD-10-CM | POA: Diagnosis not present

## 2021-12-09 ENCOUNTER — Encounter: Payer: Self-pay | Admitting: Internal Medicine

## 2021-12-09 LAB — WOUND CULTURE

## 2021-12-10 ENCOUNTER — Other Ambulatory Visit: Payer: Self-pay | Admitting: Internal Medicine

## 2021-12-10 ENCOUNTER — Other Ambulatory Visit (HOSPITAL_COMMUNITY): Payer: Self-pay

## 2021-12-10 ENCOUNTER — Encounter: Payer: Self-pay | Admitting: Obstetrics & Gynecology

## 2021-12-10 MED ORDER — CARESTART COVID-19 HOME TEST VI KIT
PACK | 0 refills | Status: DC
  Filled 2021-12-10: qty 4, 4d supply, fill #0

## 2021-12-13 NOTE — Progress Notes (Signed)
Chief Complaint:   OBESITY April [name] is here to discuss her progress with her obesity treatment plan along with follow-up of her obesity related diagnoses. April [name] is on keeping a food journal and adhering to recommended goals of 1300-1400 calories and 90 grams protein and states she is following her eating plan approximately 40% of the time. April [name] states she is doing treadmill and light weights 30 minutes 4 times per week.  Today's visit was #: 11 Starting weight: 263 lbs Starting date: 05/17/2021 Today's weight: 224 lbs Today's date: 12/06/2021 Total lbs lost to date: 39 Total lbs lost since last in-office visit: 8  Interim History: Pt's last OV was a month ago and she did great over the holidays. She has been increasing her exercise and added light weight lifting 2 days a week. Pt has been journaling and except for a couple of days over the holidays, she is doing better hitting protein goals.  Subjective:   1. Prediabetes With increasing proteins, pt has had less sweets cravings. She denies issues with plan or hunger or cravings.  2. Hypertriglyceridemia H/o sugar cravings and snacking on candy, cookies, etc.   3. SOB (shortness of breath) on exertion Pt reports some SOB is she climbs 2 flights of stairs and it's worse with increasing weight. She denies chest pain, heart palpitations, orthopnea, paroxysmal nocturnal dyspnea.   Assessment/Plan:  No orders of the defined types were placed in this encounter.   There are no discontinued medications.   No orders of the defined types were placed in this encounter.    1. Prediabetes April [name] will continue to work on weight loss, exercise, and decreasing simple carbohydrates to help decrease the risk of diabetes. Pt declines meds.  2. Hypertriglyceridemia Pt reminded of the importance of hitting protein goals to help avoid excess carbs, especially fatty carbs to help lower triglycerides.   3. SOB (shortness of breath) on  exertion Recheck IC today. No need for additional workup or evaluation at this time. Continue weight loss and increase exercise to help with cardiovascular fitness levels.  4. Obesity with current BMI of 32.2 April [name] is currently in the action stage of change. As such, her goal is to continue with weight loss efforts. She has agreed to Change to keeping a food journal and adhering to recommended goals of 1700-1900 calories and 110+ grams protein. Use log to track.  New IC today- 2462.   Exercise goals:  As is  Behavioral modification strategies: increasing lean protein intake, decreasing simple carbohydrates, and increasing water intake.  April [name] has agreed to follow-up with our clinic in 2 weeks. She was informed of the importance of frequent follow-up visits to maximize her success with intensive lifestyle modifications for her multiple health conditions.   Objective:   Blood pressure 127/78, pulse 83, temperature 98.5 F (36.9 C), height 5\' 10"  (1.778 m), weight 224 lb (101.6 kg), SpO2 98 %. Body mass index is 32.14 kg/m.  General: Cooperative, alert, well developed, in no acute distress. HEENT: Conjunctivae and lids unremarkable. Cardiovascular: Regular rhythm.  Lungs: Normal work of breathing. Neurologic: No focal deficits.   Lab Results  Component Value Date   CREATININE 0.74 10/16/2021   BUN 11 10/16/2021   NA 139 10/16/2021   K 4.3 10/16/2021   CL 104 10/16/2021   CO2 21 10/16/2021   Lab Results  Component Value Date   ALT 29 10/16/2021   AST 22 10/16/2021   ALKPHOS 69 10/16/2021   BILITOT 0.4 10/16/2021  Lab Results  Component Value Date   HGBA1C 5.4 10/16/2021   HGBA1C 5.7 (H) 05/17/2021   HGBA1C 5.4 05/26/2020   HGBA1C 5.5 05/26/2019   HGBA1C 5.6 05/22/2018   Lab Results  Component Value Date   INSULIN 9.4 10/16/2021   INSULIN 16.3 05/17/2021   Lab Results  Component Value Date   TSH 0.955 05/17/2021   Lab Results  Component Value Date   CHOL  178 10/16/2021   HDL 53 10/16/2021   LDLCALC 97 10/16/2021   LDLDIRECT 96.0 05/26/2019   TRIG 165 (H) 10/16/2021   CHOLHDL 3.1 05/26/2020   Lab Results  Component Value Date   VD25OH 96.3 10/16/2021   VD25OH 75.2 05/17/2021   VD25OH 59 05/26/2020   Lab Results  Component Value Date   WBC 5.4 10/16/2021   HGB 12.5 10/16/2021   HCT 37.3 10/16/2021   MCV 89 10/16/2021   PLT 379 10/16/2021   Lab Results  Component Value Date   IRON 82 05/30/2021   FERRITIN 290.9 05/30/2021    Attestation Statements:   Reviewed by clinician on day of visit: allergies, medications, problem list, medical history, surgical history, family history, social history, and previous encounter notes.  Coral Ceo, CMA, am acting as transcriptionist for [employer], DO.  I have reviewed the above documentation for accuracy and completeness, and I agree with the above. Marjory Sneddon, D.O.  The [place] was signed into law in 2016 which includes the topic of electronic health records.  This provides immediate access to information in MyChart.  This includes consultation notes, operative notes, office notes, lab results and pathology reports.  If you have any questions about what you read please let us know at your next visit so we can discuss your concerns and take corrective action if need be.  We are right here with you.

## 2021-12-14 ENCOUNTER — Other Ambulatory Visit (HOSPITAL_COMMUNITY): Payer: Self-pay

## 2021-12-19 ENCOUNTER — Other Ambulatory Visit (HOSPITAL_COMMUNITY): Payer: Self-pay

## 2021-12-21 ENCOUNTER — Other Ambulatory Visit (HOSPITAL_COMMUNITY): Payer: Self-pay

## 2021-12-24 ENCOUNTER — Encounter (INDEPENDENT_AMBULATORY_CARE_PROVIDER_SITE_OTHER): Payer: Self-pay | Admitting: Adult Health

## 2021-12-24 ENCOUNTER — Ambulatory Visit (INDEPENDENT_AMBULATORY_CARE_PROVIDER_SITE_OTHER): Payer: 59 | Admitting: Adult Health

## 2021-12-24 ENCOUNTER — Other Ambulatory Visit: Payer: Self-pay

## 2021-12-24 VITALS — BP 117/68 | HR 78 | Temp 98.6°F | Ht 70.0 in | Wt 223.0 lb

## 2021-12-24 DIAGNOSIS — L02211 Cutaneous abscess of abdominal wall: Secondary | ICD-10-CM

## 2021-12-24 DIAGNOSIS — Z6832 Body mass index (BMI) 32.0-32.9, adult: Secondary | ICD-10-CM

## 2021-12-24 DIAGNOSIS — E559 Vitamin D deficiency, unspecified: Secondary | ICD-10-CM | POA: Diagnosis not present

## 2021-12-24 DIAGNOSIS — E669 Obesity, unspecified: Secondary | ICD-10-CM | POA: Diagnosis not present

## 2021-12-25 DIAGNOSIS — E559 Vitamin D deficiency, unspecified: Secondary | ICD-10-CM | POA: Insufficient documentation

## 2021-12-25 NOTE — Progress Notes (Signed)
Chief Complaint:   OBESITY April [name] is here to discuss her progress with her obesity treatment plan along with follow-up of her obesity related diagnoses. April [name] is on keeping a food journal and adhering to recommended goals of 1700-1900 calories and 110 grams of protein and states she is following her eating plan approximately 50% of the time. April [name] states she is using the treadmill for 30 minutes 3-4 times per week.  Today's visit was #: 12 Starting weight: 263 lbs Starting date: 05/17/2021 Today's weight: 223 lbs Today's date: 12/24/2021 Total lbs lost to date: 40 lbs Total lbs lost since last in-office visit: 1 lb  Interim History:  April [name] had her RMR checked at last visit - metabolism increased to 2462. Journaling plan was adjusted from 1300-1400 calories/90 grams protein/day TO 1700-1900 calories/110 grams protein/day. Challenged to consume all prescribed daily protein.  COVID-19 positive 3 weeks ago. Recent abdominal abscess- treated by PCP.  Wound is not fully closed yet.  Subjective:   1. Abscess of skin of abdomen On 12/05/2021, PCP performed wound ID. Culture demonstrated - staph+. Treated with Bactrim DS 800/160 mg BID x7 days and Rifadin 300 mg BID x7 days. She only had 3 days of Rifadin due to possible interactions with Paxlovid for her recent COVID-19 infection.  2. Vitamin D deficiency On 10/16/2021 - vitamin D level - 96.3. She is on OTC vitamin D3 5000 IU daily.  Assessment/Plan:   1. Abscess of skin of abdomen Follow-up with PCP as needed.  2. Vitamin D deficiency Check labs. Decrease OTC vitamin D3 from 5000 IU to 2000 IU daily.  3. Obesity with current BMI of 32.0  April [name] is currently in the action stage of change. As such, her goal is to continue with weight loss efforts. She has agreed to keeping a food journal and adhering to recommended goals of 1700-1900 calories and 110 grams of protein.   Exercise goals:  As is.  Behavioral  modification strategies: increasing lean protein intake, decreasing simple carbohydrates, meal planning and cooking strategies, keeping healthy foods in the home, avoiding temptations, planning for success, and keeping a strict food journal.  April [name] has agreed to follow-up with our clinic in 2-3 weeks. She was informed of the importance of frequent follow-up visits to maximize her success with intensive lifestyle modifications for her multiple health conditions.   Objective:   Blood pressure 117/68, pulse 78, temperature 98.6 F (37 C), height 5\' 10"  (1.778 m), weight 223 lb (101.2 kg), SpO2 99 %. Body mass index is 32 kg/m.  General: Cooperative, alert, well developed, in no acute distress. HEENT: Conjunctivae and lids unremarkable. Cardiovascular: Regular rhythm.  Lungs: Normal work of breathing. Neurologic: No focal deficits.   Lab Results  Component Value Date   CREATININE 0.74 10/16/2021   BUN 11 10/16/2021   NA 139 10/16/2021   K 4.3 10/16/2021   CL 104 10/16/2021   CO2 21 10/16/2021   Lab Results  Component Value Date   ALT 29 10/16/2021   AST 22 10/16/2021   ALKPHOS 69 10/16/2021   BILITOT 0.4 10/16/2021   Lab Results  Component Value Date   HGBA1C 5.4 10/16/2021   HGBA1C 5.7 (H) 05/17/2021   HGBA1C 5.4 05/26/2020   HGBA1C 5.5 05/26/2019   HGBA1C 5.6 05/22/2018   Lab Results  Component Value Date   INSULIN 9.4 10/16/2021   INSULIN 16.3 05/17/2021   Lab Results  Component Value Date   TSH 0.955 05/17/2021   Lab Results  Component Value Date   CHOL 178 10/16/2021   HDL 53 10/16/2021   LDLCALC 97 10/16/2021   LDLDIRECT 96.0 05/26/2019   TRIG 165 (H) 10/16/2021   CHOLHDL 3.1 05/26/2020   Lab Results  Component Value Date   VD25OH 96.3 10/16/2021   VD25OH 75.2 05/17/2021   VD25OH 59 05/26/2020   Lab Results  Component Value Date   WBC 5.4 10/16/2021   HGB 12.5 10/16/2021   HCT 37.3 10/16/2021   MCV 89 10/16/2021   PLT 379 10/16/2021   Lab  Results  Component Value Date   IRON 82 05/30/2021   FERRITIN 290.9 05/30/2021   Attestation Statements:   Reviewed by clinician on day of visit: allergies, medications, problem list, medical history, surgical history, family history, social history, and previous encounter notes.  Time spent on visit including pre-visit chart review and post-visit care and charting was 28 minutes.   I, Water quality scientist, CMA, am acting as Location manager for Mina Marble, NP.  I have reviewed the above documentation for accuracy and completeness, and I agree with the above. -  [place] d. Violet Seabury, NP-C

## 2021-12-28 NOTE — Progress Notes (Signed)
[place] [place] Phone: [phone] Subjective:   [name], am serving as a scribe for Dr. Hulan Saas.  This visit occurred during the SARS-CoV-2 public health emergency.  Safety protocols were in place, including screening questions prior to the visit, additional usage of staff PPE, and extensive cleaning of exam room while observing appropriate contact time as indicated for disinfecting solutions.    I'm seeing this patient by the request  of:  Janith Lima, MD  CC: Neck and back pain follow-up  WHQ:[id number]  April [name] is a 46 y.o. female coming in with complaint of back and neck pain. OMT 11/14/2021. Patient states that she has been about the same as last visit. SI joints giving her the biggest issue at the moment.  Describes the pain as a dull aching sensation.  No significant radicular symptoms but pain does not seem to ever go away.  Patient has been seeing medical weight and wellness and has continued to lose weight.  Medications patient has been prescribed: None  Taking:      Patient does have an MRI of the back from July 20 22nd showing the patient does have advanced degenerative disc disease mostly at L5-S1 with a left-sided disc protrusion causing the left S1 nerve root impingement.   Reviewed prior external information including notes and imaging from previsou exam, outside providers and external EMR if available.   As well as notes that were available from care everywhere and other healthcare systems.  Past medical history, social, surgical and family history all reviewed in electronic medical record.  No pertanent information unless stated regarding to the chief complaint.   Past Medical History:  Diagnosis Date   ALLERGIC RHINITIS    Anal fissure    Anxiety    ANXIETY DEPRESSION    Back pain    CONSTIPATION    Constipation    DEPRESSION    Esophageal reflux    Food  allergy    Avacados   Gastritis    GERD (gastroesophageal reflux disease)    HYPERCHOLESTEROLEMIA    Hyperlipidemia    Hypertension    OBESITY    Occipital neuralgia    Palpitations    Rheumatoid arthritis ([place]) 12/24/2017    Allergies  Allergen Reactions   Ciprofloxacin     toxicity   Levaquin [Levofloxacin] Anxiety    Possible CNS side effects. TOXICITY   Moxifloxacin     TOXICITY   Avocado     Other reaction(s): Unknown     Review of Systems:  No headache, visual changes, nausea, vomiting, diarrhea, constipation, dizziness, abdominal pain, skin rash, fevers, chills, night sweats, weight loss, swollen lymph nodes, joint swelling, chest pain, shortness of breath, mood changes. POSITIVE muscle aches, body aches  Objective  Blood pressure 122/82, pulse 75, height 5\' 10"  (1.778 m), weight 226 lb (102.5 kg), SpO2 98 %.   General: No apparent distress alert and oriented x3 mood and affect normal, dressed appropriately.  Patient continues to lose weight and does have improvement in core strength HEENT: Pupils equal, extraocular movements intact  Respiratory: Patient's speak in full sentences and does not appear short of breath  Cardiovascular: No lower extremity edema, non tender, no erythema  Tender to palpation in the paraspinal musculature but severely over the sacroiliac joint bilaterally.  Mild positive Faber bilaterally.  The patient does have tightness in the thoracolumbar juncture as well.  Osteopathic findings  C2 flexed  rotated and side bent right C6 flexed rotated and side bent left T3 extended rotated and side bent right inhaled rib T9 extended rotated and side bent left L2 flexed rotated and side bent right Sacrum right on right   After verbal consent patient was prepped with alcohol swab and with a 21-gauge 2 inch needle injected into the right sacroiliac joint with a total of 3 cc 0.5% Marcaine and 1 cc of Kenalog 40 mg/mL.  No blood loss.  Postinjection  instructions given  After verbal consent patient was prepped with alcohol swab and with a 21-gauge 2 inch needle injected into the left sacroiliac joint with a total of 1 cc 0.5% Marcaine and 1 cc of Kenalog 40 mg/mL.  No blood loss.  Band-Aid placed.  Postinjection instructions given    Assessment and Plan:  SI (sacroiliac) joint dysfunction Bilateral injections given December 31, 2021 Patient tolerated the procedure well, discussed icing regimen and home exercises, discussed which activities to do and which ones to avoid.  Increase activity slowly concerned patient does have more of a sacroiliitis follow-up with me again in 4 to 6 weeks.  Obesity Patient is doing fantastic with weight loss.  Encouraged her to continue to do so.  Hopefully patient's goal can be around 200 pounds and would be doing significantly better at that point    Nonallopathic problems  Decision today to treat with OMT was based on Physical Exam  After verbal consent patient was treated with HVLA, ME, FPR techniques in cervical, rib, thoracic, lumbar, and sacral  areas  Patient tolerated the procedure well with improvement in symptoms  Patient given exercises, stretches and lifestyle modifications  See medications in patient instructions if given  Patient will follow up in 4-8 weeks      The above documentation has been reviewed and is accurate and complete Lyndal Pulley, DO        Note: This dictation was prepared with Dragon dictation along with smaller phrase technology. Any transcriptional errors that result from this process are unintentional.

## 2021-12-31 ENCOUNTER — Ambulatory Visit: Payer: 59 | Admitting: Family Medicine

## 2021-12-31 ENCOUNTER — Other Ambulatory Visit: Payer: Self-pay

## 2021-12-31 VITALS — BP 122/82 | HR 75 | Ht 70.0 in | Wt 226.0 lb

## 2021-12-31 DIAGNOSIS — E669 Obesity, unspecified: Secondary | ICD-10-CM | POA: Diagnosis not present

## 2021-12-31 DIAGNOSIS — M9901 Segmental and somatic dysfunction of cervical region: Secondary | ICD-10-CM | POA: Diagnosis not present

## 2021-12-31 DIAGNOSIS — M533 Sacrococcygeal disorders, not elsewhere classified: Secondary | ICD-10-CM | POA: Diagnosis not present

## 2021-12-31 DIAGNOSIS — M9908 Segmental and somatic dysfunction of rib cage: Secondary | ICD-10-CM

## 2021-12-31 DIAGNOSIS — M9903 Segmental and somatic dysfunction of lumbar region: Secondary | ICD-10-CM | POA: Diagnosis not present

## 2021-12-31 DIAGNOSIS — M9902 Segmental and somatic dysfunction of thoracic region: Secondary | ICD-10-CM | POA: Diagnosis not present

## 2021-12-31 DIAGNOSIS — M069 Rheumatoid arthritis, unspecified: Secondary | ICD-10-CM | POA: Diagnosis not present

## 2021-12-31 DIAGNOSIS — M9904 Segmental and somatic dysfunction of sacral region: Secondary | ICD-10-CM | POA: Diagnosis not present

## 2021-12-31 DIAGNOSIS — Z6832 Body mass index (BMI) 32.0-32.9, adult: Secondary | ICD-10-CM

## 2021-12-31 NOTE — Assessment & Plan Note (Signed)
Patient is doing fantastic with weight loss.  Encouraged her to continue to do so.  Hopefully patient's goal can be around 200 pounds and would be doing significantly better at that point

## 2021-12-31 NOTE — Patient Instructions (Addendum)
Injected both SI joints today See me in 4-6 weeks Keep doing well with weight loss

## 2021-12-31 NOTE — Assessment & Plan Note (Addendum)
Bilateral injections given December 31, 2021 Patient tolerated the procedure well, discussed icing regimen and home exercises, discussed which activities to do and which ones to avoid.  Increase activity slowly concerned patient does have more of a sacroiliitis follow-up with me again in 4 to 6 weeks.

## 2022-01-09 ENCOUNTER — Other Ambulatory Visit (HOSPITAL_COMMUNITY): Payer: Self-pay

## 2022-01-10 ENCOUNTER — Other Ambulatory Visit (HOSPITAL_COMMUNITY): Payer: Self-pay

## 2022-01-10 ENCOUNTER — Other Ambulatory Visit: Payer: Self-pay | Admitting: Pharmacist

## 2022-01-10 MED ORDER — CIMZIA 2 X 200 MG/ML ~~LOC~~ PSKT
PREFILLED_SYRINGE | SUBCUTANEOUS | 2 refills | Status: DC
  Filled 2022-01-10: qty 2, 28d supply, fill #0
  Filled 2022-02-11: qty 1, 28d supply, fill #1
  Filled 2022-03-12: qty 1, 28d supply, fill #2
  Filled 2022-04-29: qty 1, 28d supply, fill #3
  Filled 2022-05-23: qty 1, 28d supply, fill #4

## 2022-01-10 MED ORDER — CIMZIA 2 X 200 MG/ML ~~LOC~~ PSKT: PREFILLED_SYRINGE | SUBCUTANEOUS | 2 refills | Status: DC

## 2022-01-14 ENCOUNTER — Other Ambulatory Visit: Payer: Self-pay | Admitting: Internal Medicine

## 2022-01-14 MED ORDER — ESCITALOPRAM OXALATE 20 MG PO TABS
20.0000 mg | ORAL_TABLET | Freq: Every day | ORAL | 1 refills | Status: DC
  Filled 2022-01-14: qty 90, 90d supply, fill #0
  Filled 2022-04-29: qty 90, 90d supply, fill #1

## 2022-01-15 ENCOUNTER — Other Ambulatory Visit (HOSPITAL_COMMUNITY): Payer: Self-pay

## 2022-01-15 ENCOUNTER — Other Ambulatory Visit: Payer: Self-pay

## 2022-01-15 ENCOUNTER — Ambulatory Visit (INDEPENDENT_AMBULATORY_CARE_PROVIDER_SITE_OTHER): Payer: 59 | Admitting: Adult Health

## 2022-01-15 ENCOUNTER — Encounter (INDEPENDENT_AMBULATORY_CARE_PROVIDER_SITE_OTHER): Payer: Self-pay | Admitting: Adult Health

## 2022-01-15 VITALS — BP 109/66 | HR 80 | Temp 97.6°F | Ht 70.0 in | Wt 217.0 lb

## 2022-01-15 DIAGNOSIS — E66812 Obesity, class 2: Secondary | ICD-10-CM

## 2022-01-15 DIAGNOSIS — E559 Vitamin D deficiency, unspecified: Secondary | ICD-10-CM | POA: Diagnosis not present

## 2022-01-15 DIAGNOSIS — Z6831 Body mass index (BMI) 31.0-31.9, adult: Secondary | ICD-10-CM | POA: Diagnosis not present

## 2022-01-15 DIAGNOSIS — E669 Obesity, unspecified: Secondary | ICD-10-CM | POA: Diagnosis not present

## 2022-01-15 DIAGNOSIS — R7303 Prediabetes: Secondary | ICD-10-CM | POA: Diagnosis not present

## 2022-01-15 NOTE — Progress Notes (Signed)
Chief Complaint:   OBESITY April [name] is here to discuss her progress with her obesity treatment plan along with follow-up of her obesity related diagnoses. April [name] is on keeping a food journal and adhering to recommended goals of 1700-1900 calories and 110 grams of protein and states she is following her eating plan approximately 40% of the time. 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  Today's visit was #: 54 Starting weight: 263 lbs Starting date: 05/17/2021 Today's weight: 217 lbs Today's date: 01/15/2022 Total lbs lost to date: 46 lbs Total lbs lost since last in-office visit: 6 lbs  Interim History:  April [name] has been tracking her intake 100% -  On average she consumes Calories 1800-2000 Protein 80-95 grams per day.  She says she has had less fatigue since starting at [facility].  Subjective:   1. Vitamin D deficiency She has remained on lower OTC vitamin D3 2000 IU daily dosage with high normal Vit D level in 11/22-96.3 She denies N/V/muscle weakness.  2. Prediabetes On 05/17/2021, A1c 5.7, lower at 5.4 on 10/16/2021. She denies polyphagia.  Assessment/Plan:   1. Vitamin D deficiency Check labs at next office visit.  2. Prediabetes Check labs at next office visit.  3. Obesity with current BMI of 31.2  April [name] is currently in the action stage of change. As such, her goal is to continue with weight loss efforts. She has agreed to keeping a food journal and adhering to recommended goals of 1700-1900 calories and 110 grams of protein.   Exercise goals:  As is.  Behavioral modification strategies: increasing lean protein intake, decreasing simple carbohydrates, meal planning and cooking strategies, keeping healthy foods in the home, and planning for success.  April [name] has agreed to follow-up with our clinic in 3 weeks, fasting. She was informed of the importance of frequent follow-up visits to maximize her success with  intensive lifestyle modifications for her multiple health conditions.   Objective:   Blood pressure 109/66, pulse 80, temperature 97.6 F (36.4 C), height 5\' 10"  (1.778 m), weight 217 lb (98.4 kg), SpO2 99 %. Body mass index is 31.14 kg/m.  General: Cooperative, alert, well developed, in no acute distress. HEENT: Conjunctivae and lids unremarkable. Cardiovascular: Regular rhythm.  Lungs: Normal work of breathing. Neurologic: No focal deficits.   Lab Results  Component Value Date   CREATININE 0.74 10/16/2021   BUN 11 10/16/2021   NA 139 10/16/2021   K 4.3 10/16/2021   CL 104 10/16/2021   CO2 21 10/16/2021   Lab Results  Component Value Date   ALT 29 10/16/2021   AST 22 10/16/2021   ALKPHOS 69 10/16/2021   BILITOT 0.4 10/16/2021   Lab Results  Component Value Date   HGBA1C 5.4 10/16/2021   HGBA1C 5.7 (H) 05/17/2021   HGBA1C 5.4 05/26/2020   HGBA1C 5.5 05/26/2019   HGBA1C 5.6 05/22/2018   Lab Results  Component Value Date   INSULIN 9.4 10/16/2021   INSULIN 16.3 05/17/2021   Lab Results  Component Value Date   TSH 0.955 05/17/2021   Lab Results  Component Value Date   CHOL 178 10/16/2021   HDL 53 10/16/2021   LDLCALC 97 10/16/2021   LDLDIRECT 96.0 05/26/2019   TRIG 165 (H) 10/16/2021   CHOLHDL 3.1 05/26/2020   Lab Results  Component Value Date   VD25OH 96.3 10/16/2021   VD25OH 75.2 05/17/2021   VD25OH 59 05/26/2020   Lab Results  Component Value Date  WBC 5.4 10/16/2021   HGB 12.5 10/16/2021   HCT 37.3 10/16/2021   MCV 89 10/16/2021   PLT 379 10/16/2021   Lab Results  Component Value Date   IRON 82 05/30/2021   FERRITIN 290.9 05/30/2021   Attestation Statements:   Reviewed by clinician on day of visit: allergies, medications, problem list, medical history, surgical history, family history, social history, and previous encounter notes.  Time spent on visit including pre-visit chart review and post-visit care and charting was 28 minutes.    I, Water quality scientist, CMA, am acting as Location manager for Mina Marble, NP.  I have reviewed the above documentation for accuracy and completeness, and I agree with the above. -  Jolie Strohecker d. Gulianna Hornsby, NP-C

## 2022-01-21 ENCOUNTER — Other Ambulatory Visit (HOSPITAL_COMMUNITY): Payer: Self-pay

## 2022-01-22 ENCOUNTER — Other Ambulatory Visit (HOSPITAL_COMMUNITY): Payer: Self-pay

## 2022-01-23 ENCOUNTER — Other Ambulatory Visit (HOSPITAL_COMMUNITY): Payer: Self-pay

## 2022-01-24 ENCOUNTER — Other Ambulatory Visit (HOSPITAL_COMMUNITY): Payer: Self-pay

## 2022-01-29 ENCOUNTER — Other Ambulatory Visit (HOSPITAL_COMMUNITY): Payer: Self-pay

## 2022-01-30 ENCOUNTER — Other Ambulatory Visit (HOSPITAL_COMMUNITY): Payer: Self-pay

## 2022-01-30 MED ORDER — METHOTREXATE SODIUM CHEMO INJECTION 50 MG/2ML
25.0000 mg | INTRAMUSCULAR | 0 refills | Status: DC
  Filled 2022-01-30: qty 4, 28d supply, fill #0

## 2022-02-05 NOTE — Progress Notes (Signed)
?Charlann Boxer D.O. ?[facility] ?[place] ?Phone: [phone] ?Subjective:   ?I, [name], am serving as a scribe for Dr. Hulan Saas. ? ?This visit occurred during the SARS-CoV-2 public health emergency.  Safety protocols were in place, including screening questions prior to the visit, additional usage of staff PPE, and extensive cleaning of exam room while observing appropriate contact time as indicated for disinfecting solutions.  ? ? ?I'm seeing this patient by the request  of:  Janith Lima, MD ? ?CC: back and neck pain  ? ?[id number]  ?April [name] is a 46 y.o. female coming in with complaint of back and neck pain. OMT 12/31/2021. Patient states that she has been  ? ?Medications patient has been prescribed: None ? ?Taking: ? ? ?  ? ? ? ? ?Reviewed prior external information including notes and imaging from previsou exam, outside providers and external EMR if available.  ? ?As well as notes that were available from care everywhere and other healthcare systems. ? ?Past medical history, social, surgical and family history all reviewed in electronic medical record.  No pertanent information unless stated regarding to the chief complaint.  ? ?Past Medical History:  ?Diagnosis Date  ? ALLERGIC RHINITIS   ? Anal fissure   ? Anxiety   ? ANXIETY DEPRESSION   ? Back pain   ? CONSTIPATION   ? Constipation   ? DEPRESSION   ? Esophageal reflux   ? Food allergy   ? Avacados  ? Gastritis   ? GERD (gastroesophageal reflux disease)   ? HYPERCHOLESTEROLEMIA   ? Hyperlipidemia   ? Hypertension   ? OBESITY   ? Occipital neuralgia   ? Palpitations   ? Rheumatoid arthritis ([place]) 12/24/2017  ?  ?Allergies  ?Allergen Reactions  ? Ciprofloxacin   ?  toxicity  ? Levaquin [Levofloxacin] Anxiety  ?  Possible CNS side effects. TOXICITY  ? Moxifloxacin   ?  TOXICITY  ? Avocado   ?  Other reaction(s): Unknown  ? ? ? ?Review of Systems: ? No headache, visual changes, nausea,  vomiting, diarrhea, constipation, dizziness, abdominal pain, skin rash, fevers, chills, night sweats, weight loss, swollen lymph nodes, body aches, joint swelling, chest pain, shortness of breath, mood changes. POSITIVE muscle aches ? ?Objective  ?Blood pressure 120/70, pulse 83, height '5\' 10"'$  (1.778 m), weight 217 lb (98.4 kg), SpO2 97 %. ?  ?General: No apparent distress alert and oriented x3 mood and affect normal, dressed appropriately.  ?HEENT: Pupils equal, extraocular movements intact  ?Respiratory: Patient's speak in full sentences and does not appear short of breath  ?Cardiovascular: No lower extremity edema, non tender, no erythema  ?Low back exam does show the patient does have improvement in core strength noted.  Patient does still have some tenderness noted more in the thoracolumbar junction.  Patient has increasing tightness noted in the neck on the left side. ?Osteopathic findings ? ?C2 flexed rotated and side bent left ?C6 flexed rotated and side bent left ?T3 extended rotated and side bent left inhaled rib ?L2 flexed rotated and side bent right ?Sacrum right on right ? ? ? ? ?  ?Assessment and Plan: ? ?SI (sacroiliac) joint dysfunction ?Chronic, stable.  Patient continues to do relatively well with her weight loss.  I do think that this has been significantly beneficial.  Discussed with her to continue to work on her core strength and stability.  Discussed icing regimen.  Follow-up with me again  in 6 to 8 weeks ?  ? ?Nonallopathic problems ? ?Decision today to treat with OMT was based on Physical Exam ? ?After verbal consent patient was treated with HVLA, ME, FPR techniques in cervical, rib, thoracic, lumbar, and sacral  areas ? ?Patient tolerated the procedure well with improvement in symptoms ? ?Patient given exercises, stretches and lifestyle modifications ? ?See medications in patient instructions if given ? ?Patient will follow up in 4-8 weeks ? ?  ? ? ?The above documentation has been reviewed  and is accurate and complete Lyndal Pulley, DO ? ? ? ?  ? ? Note: This dictation was prepared with Dragon dictation along with smaller phrase technology. Any transcriptional errors that result from this process are unintentional.    ?  ?  ? ?

## 2022-02-06 ENCOUNTER — Other Ambulatory Visit: Payer: Self-pay

## 2022-02-06 ENCOUNTER — Ambulatory Visit: Payer: 59 | Admitting: Family Medicine

## 2022-02-06 VITALS — BP 120/70 | HR 83 | Ht 70.0 in | Wt 217.0 lb

## 2022-02-06 DIAGNOSIS — M9908 Segmental and somatic dysfunction of rib cage: Secondary | ICD-10-CM | POA: Diagnosis not present

## 2022-02-06 DIAGNOSIS — M9903 Segmental and somatic dysfunction of lumbar region: Secondary | ICD-10-CM | POA: Diagnosis not present

## 2022-02-06 DIAGNOSIS — M9901 Segmental and somatic dysfunction of cervical region: Secondary | ICD-10-CM

## 2022-02-06 DIAGNOSIS — M533 Sacrococcygeal disorders, not elsewhere classified: Secondary | ICD-10-CM | POA: Diagnosis not present

## 2022-02-06 DIAGNOSIS — M9902 Segmental and somatic dysfunction of thoracic region: Secondary | ICD-10-CM

## 2022-02-06 DIAGNOSIS — M9904 Segmental and somatic dysfunction of sacral region: Secondary | ICD-10-CM | POA: Diagnosis not present

## 2022-02-06 NOTE — Assessment & Plan Note (Signed)
Chronic, stable.  Patient continues to do relatively well with her weight loss.  I do think that this has been significantly beneficial.  Discussed with her to continue to work on her core strength and stability.  Discussed icing regimen.  Follow-up with me again in 6 to 8 weeks ?

## 2022-02-06 NOTE — Patient Instructions (Signed)
See me in 6 weeks

## 2022-02-11 ENCOUNTER — Other Ambulatory Visit (HOSPITAL_COMMUNITY): Payer: Self-pay

## 2022-02-17 ENCOUNTER — Other Ambulatory Visit: Payer: Self-pay | Admitting: Internal Medicine

## 2022-02-17 MED ORDER — MONTELUKAST SODIUM 10 MG PO TABS
10.0000 mg | ORAL_TABLET | Freq: Every day | ORAL | 1 refills | Status: DC
  Filled 2022-02-17: qty 90, 90d supply, fill #0
  Filled 2022-05-24: qty 90, 90d supply, fill #1

## 2022-02-18 ENCOUNTER — Encounter (INDEPENDENT_AMBULATORY_CARE_PROVIDER_SITE_OTHER): Payer: Self-pay | Admitting: Adult Health

## 2022-02-18 ENCOUNTER — Other Ambulatory Visit (HOSPITAL_COMMUNITY): Payer: Self-pay

## 2022-02-18 ENCOUNTER — Ambulatory Visit (INDEPENDENT_AMBULATORY_CARE_PROVIDER_SITE_OTHER): Payer: 59 | Admitting: Adult Health

## 2022-02-18 VITALS — BP 106/67 | HR 77 | Temp 98.0°F | Ht 70.0 in | Wt 212.0 lb

## 2022-02-18 DIAGNOSIS — F39 Unspecified mood [affective] disorder: Secondary | ICD-10-CM

## 2022-02-18 DIAGNOSIS — Z6830 Body mass index (BMI) 30.0-30.9, adult: Secondary | ICD-10-CM | POA: Diagnosis not present

## 2022-02-18 DIAGNOSIS — Z79899 Other long term (current) drug therapy: Secondary | ICD-10-CM | POA: Diagnosis not present

## 2022-02-18 DIAGNOSIS — M0579 Rheumatoid arthritis with rheumatoid factor of multiple sites without organ or systems involvement: Secondary | ICD-10-CM | POA: Diagnosis not present

## 2022-02-18 DIAGNOSIS — E669 Obesity, unspecified: Secondary | ICD-10-CM

## 2022-02-18 DIAGNOSIS — E559 Vitamin D deficiency, unspecified: Secondary | ICD-10-CM | POA: Diagnosis not present

## 2022-02-18 DIAGNOSIS — R5383 Other fatigue: Secondary | ICD-10-CM | POA: Diagnosis not present

## 2022-02-18 DIAGNOSIS — G43719 Chronic migraine without aura, intractable, without status migrainosus: Secondary | ICD-10-CM | POA: Diagnosis not present

## 2022-02-18 DIAGNOSIS — Z6832 Body mass index (BMI) 32.0-32.9, adult: Secondary | ICD-10-CM | POA: Diagnosis not present

## 2022-02-18 DIAGNOSIS — R519 Headache, unspecified: Secondary | ICD-10-CM | POA: Diagnosis not present

## 2022-02-18 DIAGNOSIS — L409 Psoriasis, unspecified: Secondary | ICD-10-CM | POA: Diagnosis not present

## 2022-02-18 DIAGNOSIS — H15101 Unspecified episcleritis, right eye: Secondary | ICD-10-CM | POA: Diagnosis not present

## 2022-02-18 MED ORDER — METHOTREXATE SODIUM CHEMO INJECTION 50 MG/2ML
25.0000 mg | INTRAMUSCULAR | 0 refills | Status: DC
  Filled 2022-02-18 – 2022-02-27 (×2): qty 12, 84d supply, fill #0

## 2022-02-18 MED ORDER — GABAPENTIN 300 MG PO CAPS
300.0000 mg | ORAL_CAPSULE | Freq: Every day | ORAL | 1 refills | Status: DC
  Filled 2022-02-18 – 2022-04-18 (×2): qty 90, 90d supply, fill #0
  Filled 2022-07-19: qty 90, 90d supply, fill #1

## 2022-02-18 MED ORDER — FOLIC ACID 1 MG PO TABS
1.0000 mg | ORAL_TABLET | Freq: Every day | ORAL | 3 refills | Status: DC
  Filled 2022-02-18: qty 90, 90d supply, fill #0

## 2022-02-20 ENCOUNTER — Other Ambulatory Visit (HOSPITAL_COMMUNITY): Payer: Self-pay

## 2022-02-20 DIAGNOSIS — F39 Unspecified mood [affective] disorder: Secondary | ICD-10-CM | POA: Insufficient documentation

## 2022-02-20 NOTE — Progress Notes (Signed)
? ? ? ?Chief Complaint:  ? ?OBESITY ?April [name] is here to discuss her progress with her obesity treatment plan along with follow-up of her obesity related diagnoses. Yarelly is on keeping a food journal and adhering to recommended goals of 1700-1900 calories and 110 grams of protein and states she is following her eating plan approximately 50% of the time. Chiffon states she is doing cardio and weights for 30-40 minutes 34 times per week. ? ?Today's visit was #: 14 ?Starting weight: 263 lbs ?Starting date: 05/17/2021 ?Today's weight: 212 lbs ?Today's date: 02/20/2022 ?Total lbs lost to date: 54 lbs ?Total lbs lost since last in-office visit: 5 lbs ? ?Interim History:  ?Unable to complete fasting labs at [place] today. ?Reviewed intake log- ?Average daily calories consumed 1300-2700. ?Average daily protein consumes 56g-168g. ? ?Subjective:  ? ?1. Vitamin D deficiency ?OTC supplement was decreased from vitamin D3 5,000 IU to 2,000 IU. ? ?Lab Results  ?Component Value Date  ? VD25OH 96.3 10/16/2021  ? VD25OH 75.2 05/17/2021  ? VD25OH 59 05/26/2020  ? ?2. Mood disorder ([place]) with emotional eating ?She reports stable mood, denies SI/HI. ?She is on Lexapro 50 mg daily Buspar 15 mg BID - PRN. ?She reports decrease with recent move from [facility] main [facility] to [facility]. ? ?Assessment/Plan:  ? ?1. Vitamin D deficiency ?Check labs at next office visit. ? ?2. Mood disorder ([place]) with emotional eating ?Continue regular exercise and current medication regimen. ? ?3. Obesity with current BMI of 30.5 ? ?Kewanda is currently in the action stage of change. As such, her goal is to continue with weight loss efforts. She has agreed to keeping a food journal and adhering to recommended goals of 300-400 calories and 30+ protein at lunch and 400-500 calories and 35 grams of protein at supper.  ? ?Exercise goals:  As is. ? ?Behavioral modification strategies: increasing lean protein intake, decreasing simple carbohydrates, meal  planning and cooking strategies, keeping healthy foods in the home, planning for success, and keeping a strict food journal. ? ?April [name] has agreed to follow-up with our clinic in 3 weeks. She was informed of the importance of frequent follow-up visits to maximize her success with intensive lifestyle modifications for her multiple health conditions.  ? ?Objective:  ? ?Blood pressure 106/67, pulse 77, temperature 98 ?F (36.7 ?C), height '5\' 10"'$  (1.778 m), weight 212 lb (96.2 kg), SpO2 100 %. ?Body mass index is 30.42 kg/m?. ? ?General: Cooperative, alert, well developed, in no acute distress. ?HEENT: Conjunctivae and lids unremarkable. ?Cardiovascular: Regular rhythm.  ?Lungs: Normal work of breathing. ?Neurologic: No focal deficits.  ? ?Lab Results  ?Component Value Date  ? CREATININE 0.74 10/16/2021  ? BUN 11 10/16/2021  ? NA 139 10/16/2021  ? K 4.3 10/16/2021  ? CL 104 10/16/2021  ? CO2 21 10/16/2021  ? ?Lab Results  ?Component Value Date  ? ALT 29 10/16/2021  ? AST 22 10/16/2021  ? ALKPHOS 69 10/16/2021  ? BILITOT 0.4 10/16/2021  ? ?Lab Results  ?Component Value Date  ? HGBA1C 5.4 10/16/2021  ? HGBA1C 5.7 (H) 05/17/2021  ? HGBA1C 5.4 05/26/2020  ? HGBA1C 5.5 05/26/2019  ? HGBA1C 5.6 05/22/2018  ? ?Lab Results  ?Component Value Date  ? INSULIN 9.4 10/16/2021  ? INSULIN 16.3 05/17/2021  ? ?Lab Results  ?Component Value Date  ? TSH 0.955 05/17/2021  ? ?Lab Results  ?Component Value Date  ? CHOL 178 10/16/2021  ? HDL 53 10/16/2021  ? [place] 97  10/16/2021  ? LDLDIRECT 96.0 05/26/2019  ? TRIG 165 (H) 10/16/2021  ? CHOLHDL 3.1 05/26/2020  ? ?Lab Results  ?Component Value Date  ? VD25OH 96.3 10/16/2021  ? VD25OH 75.2 05/17/2021  ? VD25OH 59 05/26/2020  ? ?Lab Results  ?Component Value Date  ? WBC 5.4 10/16/2021  ? HGB 12.5 10/16/2021  ? HCT 37.3 10/16/2021  ? MCV 89 10/16/2021  ? PLT 379 10/16/2021  ? ?Lab Results  ?Component Value Date  ? IRON 82 05/30/2021  ? FERRITIN 290.9 05/30/2021  ? ?Attestation Statements:   ? ?Reviewed by clinician on day of visit: allergies, medications, problem list, medical history, surgical history, family history, social history, and previous encounter notes. ? ?I, Water quality scientist, CMA, am acting as Location manager for Mina Marble, NP. ? ?I have reviewed the above documentation for accuracy and completeness, and I agree with the above. -  Amiri Tritch d. Tequan Redmon, NP-C ?

## 2022-02-27 ENCOUNTER — Other Ambulatory Visit (HOSPITAL_COMMUNITY): Payer: Self-pay

## 2022-03-04 ENCOUNTER — Other Ambulatory Visit (HOSPITAL_COMMUNITY): Payer: Self-pay

## 2022-03-08 ENCOUNTER — Other Ambulatory Visit (HOSPITAL_COMMUNITY): Payer: Self-pay

## 2022-03-11 ENCOUNTER — Other Ambulatory Visit (HOSPITAL_COMMUNITY): Payer: Self-pay

## 2022-03-12 ENCOUNTER — Other Ambulatory Visit (HOSPITAL_COMMUNITY): Payer: Self-pay

## 2022-03-18 ENCOUNTER — Ambulatory Visit (INDEPENDENT_AMBULATORY_CARE_PROVIDER_SITE_OTHER): Payer: 59 | Admitting: Adult Health

## 2022-03-18 ENCOUNTER — Encounter (INDEPENDENT_AMBULATORY_CARE_PROVIDER_SITE_OTHER): Payer: Self-pay | Admitting: Adult Health

## 2022-03-18 VITALS — BP 114/71 | HR 70 | Temp 97.9°F | Ht 70.0 in | Wt 212.0 lb

## 2022-03-18 DIAGNOSIS — E781 Pure hyperglyceridemia: Secondary | ICD-10-CM

## 2022-03-18 DIAGNOSIS — Z6830 Body mass index (BMI) 30.0-30.9, adult: Secondary | ICD-10-CM | POA: Diagnosis not present

## 2022-03-18 DIAGNOSIS — E669 Obesity, unspecified: Secondary | ICD-10-CM

## 2022-03-18 DIAGNOSIS — E559 Vitamin D deficiency, unspecified: Secondary | ICD-10-CM

## 2022-03-18 DIAGNOSIS — R7303 Prediabetes: Secondary | ICD-10-CM

## 2022-03-18 DIAGNOSIS — Z9189 Other specified personal risk factors, not elsewhere classified: Secondary | ICD-10-CM

## 2022-03-18 DIAGNOSIS — E66812 Obesity, class 2: Secondary | ICD-10-CM

## 2022-03-19 ENCOUNTER — Telehealth (INDEPENDENT_AMBULATORY_CARE_PROVIDER_SITE_OTHER): Payer: Self-pay

## 2022-03-19 ENCOUNTER — Encounter (INDEPENDENT_AMBULATORY_CARE_PROVIDER_SITE_OTHER): Payer: Self-pay

## 2022-03-19 LAB — VITAMIN D 25 HYDROXY (VIT D DEFICIENCY, FRACTURES): Vit D, 25-Hydroxy: 120 ng/mL — ABNORMAL HIGH (ref 30.0–100.0)

## 2022-03-19 LAB — COMPREHENSIVE METABOLIC PANEL
ALT: 30 IU/L (ref 0–32)
AST: 21 IU/L (ref 0–40)
Albumin/Globulin Ratio: 1.2 (ref 1.2–2.2)
Albumin: 3.9 g/dL (ref 3.8–4.8)
Alkaline Phosphatase: 74 IU/L (ref 44–121)
BUN/Creatinine Ratio: 13 (ref 9–23)
BUN: 10 mg/dL (ref 6–24)
Bilirubin Total: 0.3 mg/dL (ref 0.0–1.2)
CO2: 22 mmol/L (ref 20–29)
Calcium: 9.5 mg/dL (ref 8.7–10.2)
Chloride: 103 mmol/L (ref 96–106)
Creatinine, Ser: 0.75 mg/dL (ref 0.57–1.00)
Globulin, Total: 3.2 g/dL (ref 1.5–4.5)
Glucose: 72 mg/dL (ref 70–99)
Potassium: 4.7 mmol/L (ref 3.5–5.2)
Sodium: 138 mmol/L (ref 134–144)
Total Protein: 7.1 g/dL (ref 6.0–8.5)
eGFR: 100 mL/min/{1.73_m2} (ref >59)

## 2022-03-19 LAB — LIPID PANEL
Chol/HDL Ratio: 3.1 ratio (ref 0.0–4.4)
Cholesterol, Total: 193 mg/dL (ref 100–199)
HDL: 62 mg/dL (ref >39)
LDL Chol Calc (NIH): 106 mg/dL — ABNORMAL HIGH (ref 0–99)
Triglycerides: 142 mg/dL (ref 0–149)
VLDL Cholesterol Cal: 25 mg/dL (ref 5–40)

## 2022-03-19 LAB — INSULIN, RANDOM: INSULIN: 5.7 u[IU]/mL (ref 2.6–24.9)

## 2022-03-19 LAB — HEMOGLOBIN A1C
Est. average glucose Bld gHb Est-mCnc: 105 mg/dL
Hgb A1c MFr Bld: 5.3 % (ref 4.8–5.6)

## 2022-03-19 NOTE — Telephone Encounter (Signed)
-----   Message from Esaw Grandchild, NP sent at 03/19/2022  8:42 AM EDT ----- ?Good Morning Latanga Nedrow, ?Can you please call Ms. Hofland and tell her to STOP all Vit D supp. ?She is overreplaced- Level 120. ?Please ask if she is experiencing N/V/Muscle Weakness- please document answer. ?Will re-check her Vit D Level in 8 weeks. ?Sincerely, ?Katy ?----- Message ----- ?From: Interface, [facility] Lab Results In ?Sent: 03/19/2022   7:39 AM EDT ?To: Esaw Grandchild, NP ? ? ?

## 2022-03-19 NOTE — Telephone Encounter (Signed)
Contacted pt and instructed  her to STOP all Vit D supplements. ?Advised she is overreplaced- Level 120. ?Pt denies experiencing N/V/Muscle Weakness- please but sill call she these symptoms develop. ?Will re-check her Vit D Level in 8 weeks. Pt verbalized understanding. ?

## 2022-03-19 NOTE — Progress Notes (Signed)
Done and documented.

## 2022-03-20 ENCOUNTER — Other Ambulatory Visit: Payer: Self-pay | Admitting: Internal Medicine

## 2022-03-20 ENCOUNTER — Other Ambulatory Visit (HOSPITAL_COMMUNITY): Payer: Self-pay

## 2022-03-20 DIAGNOSIS — I10 Essential (primary) hypertension: Secondary | ICD-10-CM

## 2022-03-20 MED ORDER — LOSARTAN POTASSIUM 50 MG PO TABS
50.0000 mg | ORAL_TABLET | Freq: Every day | ORAL | 1 refills | Status: DC
  Filled 2022-03-20 – 2022-08-29 (×3): qty 90, 90d supply, fill #0
  Filled 2022-11-28: qty 90, 90d supply, fill #1

## 2022-03-25 ENCOUNTER — Other Ambulatory Visit (HOSPITAL_COMMUNITY): Payer: Self-pay

## 2022-03-26 NOTE — Progress Notes (Signed)
?Charlann Boxer D.O. ?[facility] ?[place] ?Phone: [phone] ?Subjective:   ?I, Vilma Meckel, am serving as a Education administrator for Dr. Hulan Saas. ?This visit occurred during the SARS-CoV-2 public health emergency.  Safety protocols were in place, including screening questions prior to the visit, additional usage of staff PPE, and extensive cleaning of exam room while observing appropriate contact time as indicated for disinfecting solutions.  ? ?I'm seeing this patient by the request  of:  Janith Lima, MD ? ?CC: Bilateral back and neck pain ? ?[id number]  ?April [name] is a 46 y.o. female coming in with complaint of back and neck pain. OMT 02/06/2022. Patient states same per usual. No new complaints. ? ?Medications patient has been prescribed: None ? ?Taking: ? ? ?  ? ? ? ? ?Past Medical History:  ?Diagnosis Date  ? ALLERGIC RHINITIS   ? Anal fissure   ? Anxiety   ? ANXIETY DEPRESSION   ? Back pain   ? CONSTIPATION   ? Constipation   ? DEPRESSION   ? Esophageal reflux   ? Food allergy   ? Avacados  ? Gastritis   ? GERD (gastroesophageal reflux disease)   ? HYPERCHOLESTEROLEMIA   ? Hyperlipidemia   ? Hypertension   ? OBESITY   ? Occipital neuralgia   ? Palpitations   ? Rheumatoid arthritis ([place]) 12/24/2017  ?  ?Allergies  ?Allergen Reactions  ? Ciprofloxacin   ?  toxicity  ? Levaquin [Levofloxacin] Anxiety  ?  Possible CNS side effects. TOXICITY  ? Moxifloxacin   ?  TOXICITY  ? Avocado   ?  Other reaction(s): Unknown  ? ? ? ?Review of Systems: ? No headache, visual changes, nausea, vomiting, diarrhea, constipation, dizziness, abdominal pain, skin rash, fevers, chills, night sweats, weight loss, swollen lymph nodes, body aches, joint swelling, chest pain, shortness of breath, mood changes. POSITIVE muscle aches ? ?Objective  ?Blood pressure 110/68, pulse 76, height '5\' 10"'$  (1.778 m), weight 215 lb (97.5 kg), SpO2 98 %. ?  ?General: No apparent distress alert and  oriented x3 mood and affect normal, dressed appropriately.  ?HEENT: Pupils equal, extraocular movements intact  ?Respiratory: Patient's speak in full sentences and does not appear short of breath  ?Cardiovascular: No lower extremity edema, non tender, no erythema  ?Mild hypomobility noted today.  Patient has had improvement in core strength but still does have some tightness mostly in the thoracolumbar juncture. ? ?Osteopathic findings ? ?C2 flexed rotated and side bent right ?C6 flexed rotated and side bent left ?T3 extended rotated and side bent right inhaled rib ?T11 extended rotated and side bent left ?L1 flexed rotated and side bent right ?Sacrum right on right ? ? ? ? ?  ?Assessment and Plan: ?Low back pain ?Low back does have some loss of lordosis.  Still doing better overall I think since patient has lost weight.  Encourage patient to continue to increase activity.  Discussed icing regimen.  Follow-up again in 6 to 8 weeks.  ? ?Nonallopathic problems ? ?Decision today to treat with OMT was based on Physical Exam ? ?After verbal consent patient was treated with HVLA, ME, FPR techniques in cervical, rib, thoracic, lumbar, and sacral  areas ? ?Patient tolerated the procedure well with improvement in symptoms ? ?Patient given exercises, stretches and lifestyle modifications ? ?See medications in patient instructions if given ? ?Patient will follow up in 4-8 weeks ? ?  ? ?The above documentation has been  reviewed and is accurate and complete Lyndal Pulley, DO ? ? ? ?  ? ? Note: This dictation was prepared with Dragon dictation along with smaller phrase technology. Any transcriptional errors that result from this process are unintentional.    ?  ?  ? ?

## 2022-03-28 ENCOUNTER — Ambulatory Visit: Payer: 59 | Admitting: Family Medicine

## 2022-03-28 VITALS — BP 110/68 | HR 76 | Ht 70.0 in | Wt 215.0 lb

## 2022-03-28 DIAGNOSIS — M5442 Lumbago with sciatica, left side: Secondary | ICD-10-CM

## 2022-03-28 DIAGNOSIS — M9908 Segmental and somatic dysfunction of rib cage: Secondary | ICD-10-CM | POA: Diagnosis not present

## 2022-03-28 DIAGNOSIS — M9904 Segmental and somatic dysfunction of sacral region: Secondary | ICD-10-CM | POA: Diagnosis not present

## 2022-03-28 DIAGNOSIS — M9902 Segmental and somatic dysfunction of thoracic region: Secondary | ICD-10-CM | POA: Diagnosis not present

## 2022-03-28 DIAGNOSIS — M9903 Segmental and somatic dysfunction of lumbar region: Secondary | ICD-10-CM | POA: Diagnosis not present

## 2022-03-28 DIAGNOSIS — M9901 Segmental and somatic dysfunction of cervical region: Secondary | ICD-10-CM | POA: Diagnosis not present

## 2022-03-28 DIAGNOSIS — M5441 Lumbago with sciatica, right side: Secondary | ICD-10-CM

## 2022-03-28 NOTE — Assessment & Plan Note (Signed)
Low back does have some loss of lordosis.  Still doing better overall I think since patient has lost weight.  Encourage patient to continue to increase activity.  Discussed icing regimen.  Follow-up again in 6 to 8 weeks. ?

## 2022-03-28 NOTE — Patient Instructions (Signed)
Drive safe ?Keep it up ?See you again in 8 weeks ?

## 2022-03-28 NOTE — Progress Notes (Signed)
? ? ? ?Chief Complaint:  ? ?OBESITY ?Jeanell is here to discuss her progress with her obesity treatment plan along with follow-up of her obesity related diagnoses. Ayano is on keeping a food journal and adhering to recommended goals of 1700-1900 calories and 110 plus grams of  protein and states she is following her eating plan approximately 30% of the time. Elisandra states she is doing cardio and weights for 30-40 minutes 3 times per week. ? ?Today's visit was #: 15 ?Starting weight: 263 lbs ?Starting date: 05/17/2021 ?Today's weight: 212 lbs ?Today's date: 03/18/2022 ?Total lbs lost to date: 51 lbs ?Total lbs lost since last in-office visit: 0 ? ?Interim History:  ?Jakaylah is happy to maintained her weight.  ?She is consuming 2300 calories and 110 grams of protein daily.  ?Her currently weight is 212 lbs and her goal weight is 190 lbs with a BMI of 27.  ?She notes increased polyphagia in the afternoon.  ? ?Subjective:  ? ?1. Hypertriglyceridemia ?Au Gres's Triglycerides are improving.  ? ?2. Vitamin D deficiency ?Caera is currently taking over the counter Vitamin D 3 2,000 IU every day.  ? ?3. Prediabetes ?Mckinna's mother and both grandmothers has T2D. ?She denies a family history of MTC or personal history of pancreatitis.  ?We discussed risks and benefits of Metformin or GLP-1 therapies. ? ?4. At risk for diabetes mellitus ?Emalea is at risk for diabetes mellitus due to pre-diabetes and a family history of Type 2 diabetes mellitus.  ? ?Assessment/Plan:  ? ?1. Hypertriglyceridemia ?We will check lab today. ? ?- Lipid panel ? ?2. Vitamin D deficiency ?We will check labs today.  ? ?- VITAMIN D 25 Hydroxy (Vit-D Deficiency, Fractures) ? ?3. Prediabetes ?We will check labs today.  ? ?- Comprehensive metabolic panel ?- Hemoglobin A1c ?- Insulin, random ? ?Consider initiating anti-diabetic therapy at next OV. ? ?4. At risk for diabetes mellitus ?Talayah was given approximately 15 minutes of diabetic education and  counseling today. We discussed intensive lifestyle modifications today with an emphasis on weight loss as well as increasing exercise and decreasing simple carbohydrates in her diet. We also reviewed medication options with an emphasis on risk versus benefits of those discussed. ? ?Repetitive spaced learning was employed today to elicit superior memory formation and behavioral change.  ? ?5. Obesity with current BMI of 30.4 ?Junetta is currently in the action stage of change. As such, her goal is to continue with weight loss efforts. She has agreed to keeping a food journal and adhering to recommended goals of 1700-1900 calories and 110 grams of protein.  ? ?Exercise goals:  As is.  ? ?Behavioral modification strategies: increasing lean protein intake, decreasing simple carbohydrates, meal planning and cooking strategies, keeping healthy foods in the home, and planning for success. ? ?Ia has agreed to follow-up with our clinic in 3 weeks. She was informed of the importance of frequent follow-up visits to maximize her success with intensive lifestyle modifications for her multiple health conditions.  ? ?Jordanna was informed we would discuss her lab results at her next visit unless there is a critical issue that needs to be addressed sooner. Javionna agreed to keep her next visit at the agreed upon time to discuss these results. ? ?Objective:  ? ?Blood pressure 114/71, pulse 70, temperature 97.9 ?F (36.6 ?C), height '5\' 10"'$  (1.778 m), weight 212 lb (96.2 kg), SpO2 100 %. ?Body mass index is 30.42 kg/m?. ? ?General: Cooperative, alert, well developed, in no acute distress. ?HEENT: Conjunctivae and  lids unremarkable. ?Cardiovascular: Regular rhythm.  ?Lungs: Normal work of breathing. ?Neurologic: No focal deficits.  ? ?Lab Results  ?Component Value Date  ? CREATININE 0.75 03/18/2022  ? BUN 10 03/18/2022  ? NA 138 03/18/2022  ? K 4.7 03/18/2022  ? CL 103 03/18/2022  ? CO2 22 03/18/2022  ? ?Lab Results  ?Component  Value Date  ? ALT 30 03/18/2022  ? AST 21 03/18/2022  ? ALKPHOS 74 03/18/2022  ? BILITOT 0.3 03/18/2022  ? ?Lab Results  ?Component Value Date  ? HGBA1C 5.3 03/18/2022  ? HGBA1C 5.4 10/16/2021  ? HGBA1C 5.7 (H) 05/17/2021  ? HGBA1C 5.4 05/26/2020  ? HGBA1C 5.5 05/26/2019  ? ?Lab Results  ?Component Value Date  ? INSULIN 5.7 03/18/2022  ? INSULIN 9.4 10/16/2021  ? INSULIN 16.3 05/17/2021  ? ?Lab Results  ?Component Value Date  ? TSH 0.955 05/17/2021  ? ?Lab Results  ?Component Value Date  ? CHOL 193 03/18/2022  ? HDL 62 03/18/2022  ? LDLCALC 106 (H) 03/18/2022  ? LDLDIRECT 96.0 05/26/2019  ? TRIG 142 03/18/2022  ? CHOLHDL 3.1 03/18/2022  ? ?Lab Results  ?Component Value Date  ? VD25OH 120.0 (H) 03/18/2022  ? VD25OH 96.3 10/16/2021  ? VD25OH 75.2 05/17/2021  ? ?Lab Results  ?Component Value Date  ? WBC 5.4 10/16/2021  ? HGB 12.5 10/16/2021  ? HCT 37.3 10/16/2021  ? MCV 89 10/16/2021  ? PLT 379 10/16/2021  ? ?Lab Results  ?Component Value Date  ? IRON 82 05/30/2021  ? FERRITIN 290.9 05/30/2021  ? ?Attestation Statements:  ? ?Reviewed by clinician on day of visit: allergies, medications, problem list, medical history, surgical history, family history, social history, and previous encounter notes. ? ?I, Lizbeth Bark, RMA, am acting as Location manager for Mina Marble, NP. ? ?I have reviewed the above documentation for accuracy and completeness, and I agree with the above. -  Olive Zmuda d. Antavious Spanos, NP-C ?

## 2022-04-01 ENCOUNTER — Other Ambulatory Visit (HOSPITAL_COMMUNITY): Payer: Self-pay

## 2022-04-10 ENCOUNTER — Encounter (INDEPENDENT_AMBULATORY_CARE_PROVIDER_SITE_OTHER): Payer: Self-pay | Admitting: Adult Health

## 2022-04-10 ENCOUNTER — Other Ambulatory Visit (HOSPITAL_COMMUNITY): Payer: Self-pay

## 2022-04-10 ENCOUNTER — Ambulatory Visit (INDEPENDENT_AMBULATORY_CARE_PROVIDER_SITE_OTHER): Payer: 59 | Admitting: Adult Health

## 2022-04-10 VITALS — BP 113/67 | HR 72 | Temp 97.8°F | Ht 70.0 in | Wt 211.0 lb

## 2022-04-10 DIAGNOSIS — E559 Vitamin D deficiency, unspecified: Secondary | ICD-10-CM

## 2022-04-10 DIAGNOSIS — Z6830 Body mass index (BMI) 30.0-30.9, adult: Secondary | ICD-10-CM | POA: Diagnosis not present

## 2022-04-10 DIAGNOSIS — E669 Obesity, unspecified: Secondary | ICD-10-CM

## 2022-04-10 DIAGNOSIS — I10 Essential (primary) hypertension: Secondary | ICD-10-CM

## 2022-04-10 DIAGNOSIS — R632 Polyphagia: Secondary | ICD-10-CM | POA: Diagnosis not present

## 2022-04-10 DIAGNOSIS — E7849 Other hyperlipidemia: Secondary | ICD-10-CM | POA: Diagnosis not present

## 2022-04-10 DIAGNOSIS — Z9189 Other specified personal risk factors, not elsewhere classified: Secondary | ICD-10-CM

## 2022-04-10 MED ORDER — METFORMIN HCL 500 MG PO TABS
500.0000 mg | ORAL_TABLET | Freq: Every day | ORAL | 0 refills | Status: DC
  Filled 2022-04-10: qty 30, 30d supply, fill #0

## 2022-04-13 DIAGNOSIS — I10 Essential (primary) hypertension: Secondary | ICD-10-CM | POA: Insufficient documentation

## 2022-04-13 DIAGNOSIS — R632 Polyphagia: Secondary | ICD-10-CM | POA: Insufficient documentation

## 2022-04-13 DIAGNOSIS — E7849 Other hyperlipidemia: Secondary | ICD-10-CM | POA: Insufficient documentation

## 2022-04-13 DIAGNOSIS — Z9189 Other specified personal risk factors, not elsewhere classified: Secondary | ICD-10-CM | POA: Insufficient documentation

## 2022-04-13 NOTE — Progress Notes (Unsigned)
Chief Complaint:   OBESITY April [name] is here to discuss her progress with her obesity treatment plan along with follow-up of her obesity related diagnoses. Stuart is on keeping a food journal and adhering to recommended goals of "under 2000  calories and 110+ protein and states she is following her eating plan approximately 30% of the time. Darica states she is doing cardio and strength training 40-50 minutes 3 times per week.  Today's visit was #: 45 Starting weight: 263 lbs Starting date: 05/17/2021 Today's weight: 211 lbs Today's date: 04/13/2022 Total lbs lost to date: 52 lbs Total lbs lost since last in-office visit: 1 lb  Interim History: [name] has been on a 6 week plateau.  She endorses extreme sugar cravings after lunch over the last several weeks.   Of note:  week long family vacation at [facility] middle of June.  Subjective:   1. Vitamin D deficiency Discussed labs with patient today. She stopped OTC vitamin D3, 2,000 IU *** She denies nausea, vomiting, muscle weakness.  2. Essential hypertension Discussed labs with patient today. Blood pressure and heart rate at goal. CMP:  electrolytes and kidney function stable Dannette is on Losartan 25 mg daily.  3. Polyphagia Discussed labs with patient today. *** CMP GFR-100. Emmilee is agreeable to Metformin therapy, GLP-1 backup.  4. Other hyperlipidemia Discussed labs with patient today. Lipid panel ***  5. At risk for diarrhea Siri is at higher risk of diarrhea due to medications and diet. Starting metformin for worsening polyphagia.   Assessment/Plan:   1. Vitamin D deficiency Wanda is to remain off all Vitamin D supplementations.   Check labs in 2 months.  2. Essential hypertension Continue *** and regular exercise.  3. Polyphagia Start metformin 500 mg 1/2 tablet at lunch for 1 week, then increase to 1 full tablet daily.   See below.   - metFORMIN (GLUCOPHAGE) 500 MG tablet; Take 1 tablet  (500 mg total) by mouth daily with lunch.  Dispense: 30 tablet; Refill: 0  4. Other hyperlipidemia Continue decreased saturated fat, increase regular  5. At risk for diarrhea Greydis was given approximately 15 minutes of diarrhea prevention counseling today. She is 46 y.o. female and has risk factors for diarrhea including medications and changes in diet. We discussed intensive lifestyle modifications today with an emphasis on specific weight loss instructions including dietary strategies.   Repetitive spaced learning was employed today to elicit superior memory formation and behavioral change.   6. Obesity with current BMI of 30.4 [name] is currently in the action stage of change. As such, her goal is to continue with weight loss efforts. She has agreed to keeping a food journal and adhering to recommended goals of 1700-1900 calories and 110 protein daily.  Exercise goals:  As is.  Behavioral modification strategies: increasing lean protein intake, decreasing simple carbohydrates, meal planning and cooking strategies, keeping healthy foods in the home, travel eating strategies, and planning for success.  [name] has agreed to follow-up with our clinic in 3 weeks. She was informed of the importance of frequent follow-up visits to maximize her success with intensive lifestyle modifications for her multiple health conditions.   Objective:   Blood pressure 113/67, pulse 72, temperature 97.8 F (36.6 C), height '5\' 10"'$  (1.778 m), weight 211 lb (95.7 kg), SpO2 98 %. Body mass index is 30.28 kg/m.  General: Cooperative, alert, well developed, in no acute distress. HEENT: Conjunctivae and lids unremarkable. Cardiovascular: Regular rhythm.  Lungs: Normal work of  breathing. Neurologic: No focal deficits.   Lab Results  Component Value Date   CREATININE 0.75 03/18/2022   BUN 10 03/18/2022   NA 138 03/18/2022   K 4.7 03/18/2022   CL 103 03/18/2022   CO2 22 03/18/2022   Lab Results   Component Value Date   ALT 30 03/18/2022   AST 21 03/18/2022   ALKPHOS 74 03/18/2022   BILITOT 0.3 03/18/2022   Lab Results  Component Value Date   HGBA1C 5.3 03/18/2022   HGBA1C 5.4 10/16/2021   HGBA1C 5.7 (H) 05/17/2021   HGBA1C 5.4 05/26/2020   HGBA1C 5.5 05/26/2019   Lab Results  Component Value Date   INSULIN 5.7 03/18/2022   INSULIN 9.4 10/16/2021   INSULIN 16.3 05/17/2021   Lab Results  Component Value Date   TSH 0.955 05/17/2021   Lab Results  Component Value Date   CHOL 193 03/18/2022   HDL 62 03/18/2022   LDLCALC 106 (H) 03/18/2022   LDLDIRECT 96.0 05/26/2019   TRIG 142 03/18/2022   CHOLHDL 3.1 03/18/2022   Lab Results  Component Value Date   VD25OH 120.0 (H) 03/18/2022   VD25OH 96.3 10/16/2021   VD25OH 75.2 05/17/2021   Lab Results  Component Value Date   WBC 5.4 10/16/2021   HGB 12.5 10/16/2021   HCT 37.3 10/16/2021   MCV 89 10/16/2021   PLT 379 10/16/2021   Lab Results  Component Value Date   IRON 82 05/30/2021   FERRITIN 290.9 05/30/2021    Attestation Statements:   Reviewed by clinician on day of visit: allergies, medications, problem list, medical history, surgical history, family history, social history, and previous encounter notes.  I, Davy Pique, RMA, am acting as Location manager for Mina Marble, NP.  I have reviewed the above documentation for accuracy and completeness, and I agree with the above. -  ***

## 2022-04-18 ENCOUNTER — Other Ambulatory Visit (HOSPITAL_COMMUNITY): Payer: Self-pay

## 2022-04-25 ENCOUNTER — Other Ambulatory Visit (HOSPITAL_COMMUNITY): Payer: Self-pay

## 2022-04-29 ENCOUNTER — Other Ambulatory Visit (HOSPITAL_COMMUNITY): Payer: Self-pay

## 2022-05-01 ENCOUNTER — Other Ambulatory Visit (HOSPITAL_COMMUNITY): Payer: Self-pay

## 2022-05-02 ENCOUNTER — Other Ambulatory Visit (HOSPITAL_COMMUNITY): Payer: Self-pay

## 2022-05-02 ENCOUNTER — Ambulatory Visit (INDEPENDENT_AMBULATORY_CARE_PROVIDER_SITE_OTHER): Payer: 59 | Admitting: Adult Health

## 2022-05-02 ENCOUNTER — Telehealth (INDEPENDENT_AMBULATORY_CARE_PROVIDER_SITE_OTHER): Payer: Self-pay

## 2022-05-02 ENCOUNTER — Encounter (INDEPENDENT_AMBULATORY_CARE_PROVIDER_SITE_OTHER): Payer: Self-pay | Admitting: Adult Health

## 2022-05-02 VITALS — BP 124/80 | HR 92 | Temp 98.4°F | Ht 70.0 in | Wt 214.0 lb

## 2022-05-02 DIAGNOSIS — E669 Obesity, unspecified: Secondary | ICD-10-CM

## 2022-05-02 DIAGNOSIS — R632 Polyphagia: Secondary | ICD-10-CM | POA: Diagnosis not present

## 2022-05-02 DIAGNOSIS — Z7984 Long term (current) use of oral hypoglycemic drugs: Secondary | ICD-10-CM

## 2022-05-02 DIAGNOSIS — E66812 Obesity, class 2: Secondary | ICD-10-CM

## 2022-05-02 DIAGNOSIS — Z9189 Other specified personal risk factors, not elsewhere classified: Secondary | ICD-10-CM

## 2022-05-02 DIAGNOSIS — Z6830 Body mass index (BMI) 30.0-30.9, adult: Secondary | ICD-10-CM | POA: Diagnosis not present

## 2022-05-02 DIAGNOSIS — I10 Essential (primary) hypertension: Secondary | ICD-10-CM

## 2022-05-02 DIAGNOSIS — Z7985 Long-term (current) use of injectable non-insulin antidiabetic drugs: Secondary | ICD-10-CM

## 2022-05-02 MED ORDER — WEGOVY 0.25 MG/0.5ML ~~LOC~~ SOAJ
0.2500 mg | SUBCUTANEOUS | 0 refills | Status: DC
  Filled 2022-05-02 – 2022-07-05 (×2): qty 2, 28d supply, fill #0

## 2022-05-02 NOTE — Telephone Encounter (Signed)
Prior authorization started for Wegovy. Will notify patient and provider once response is received.  

## 2022-05-02 NOTE — Telephone Encounter (Signed)
FYI

## 2022-05-06 NOTE — Progress Notes (Addendum)
Chief Complaint:   OBESITY April [name] [name] here to discuss her progress with her obesity treatment plan along with follow-up of her obesity related diagnoses. April [name] [name] on keeping a food journal and adhering to recommended goals of 1700-1900 calories and 110 grams of protein and states she [name] following her eating plan approximately 0% of the time. April [name] states she [name] doing cardio/wts 45 minutes 3-4 times per week.  Today's visit was #: 31 Starting weight: 263 lbs Starting date: 05/17/2021 Today's weight: 214 lbs Today's date: 05/02/2022 Total lbs lost to date: 49 lbs Total lbs lost since last in-office visit: 0  Interim History:  April [name] [name] exercising 45 minutes 3-4 x week. Her typical routine [name] 35 min on elliptical or 40 min on treadmill at an increased incline/grade. She will perform core exercises at least twice weekly.  She feels that she [name] overeating her calorie range and still experiencing polyphagia, even despite Metformin '500mg'$  daily at breakfast.  Subjective:   1. Benign essential hypertension April [name]'s blood pressure excellent at office visit, even despite water main break at [facility] today.  2. Polyphagia On 04/10/22 she started Metformin 500 mg- taking 1 full tablet at breakfast.  She reports zero reduction of cravings with Metformin. She denies family hx of MTC or personal of pancreatitis.  3. At risk for nausea April [name] [name] at risk for nausea due to [facility].  Assessment/Plan:   1. Benign essential hypertension April [name] will continue taking Losartan 25 mg daily and regular exercise.  2. Polyphagia April [name] will stop Metformin and start Wegovy.  3. At risk for nausea April [name] was given approximately 15 minutes of nausea prevention counseling today. April [name] [name] at risk for nausea due to her new or current medication. She was encouraged to titrate her medication slowly, make sure to stay hydrated, eat smaller portions throughout the day, and avoid high fat  meals.   4. Obesity with current BMI of 30.7 Start Wegovy 0.25 mg SubQ once weekly for 1 month with 0 refills.  -Start Semaglutide-Weight Management (WEGOVY) 0.25 MG/0.5ML SOAJ; Inject 0.25 mg into the skin once a week.  Dispense: 2 mL; Refill: 0  April [name] [name] currently in the action stage of change. April such, her goal [name] to continue with weight loss efforts. She has April to keeping a food journal and adhering to recommended goals of 1700-1900 calories and 110 grams of protein daily.  Exercise goals: April [name].  Behavioral modification strategies: increasing lean protein intake, decreasing simple carbohydrates, meal planning and cooking strategies, keeping healthy foods in the home, and planning for success.  April [name] has April [name]. She was informed of the importance of frequent follow-up visits to maximize her success with intensive lifestyle modifications for her multiple health conditions.   Objective:   Blood pressure 124/80, pulse 92, temperature 98.4 F (36.9 C), height '5\' 10"'$  (1.778 m), weight 214 lb (97.1 kg), SpO2 98 %. Body mass index [name] 30.71 kg/m.  General: Cooperative, alert, well developed, in no acute distress. HEENT: Conjunctivae and lids unremarkable. Cardiovascular: Regular rhythm.  Lungs: Normal work of breathing. Neurologic: No focal deficits.   Lab Results  Component Value Date   CREATININE 0.75 03/18/2022   BUN 10 03/18/2022   NA 138 03/18/2022   K 4.7 03/18/2022   CL 103 03/18/2022   CO2 22 03/18/2022   Lab Results  Component Value Date   ALT 30 03/18/2022   AST 21 03/18/2022  ALKPHOS 74 03/18/2022   BILITOT 0.3 03/18/2022   Lab Results  Component Value Date   HGBA1C 5.3 03/18/2022   HGBA1C 5.4 10/16/2021   HGBA1C 5.7 (H) 05/17/2021   HGBA1C 5.4 05/26/2020   HGBA1C 5.5 05/26/2019   Lab Results  Component Value Date   INSULIN 5.7 03/18/2022   INSULIN 9.4 10/16/2021   INSULIN 16.3 05/17/2021   Lab Results   Component Value Date   TSH 0.955 05/17/2021   Lab Results  Component Value Date   CHOL 193 03/18/2022   HDL 62 03/18/2022   LDLCALC 106 (H) 03/18/2022   LDLDIRECT 96.0 05/26/2019   TRIG 142 03/18/2022   CHOLHDL 3.1 03/18/2022   Lab Results  Component Value Date   VD25OH 120.0 (H) 03/18/2022   VD25OH 96.3 10/16/2021   VD25OH 75.2 05/17/2021   Lab Results  Component Value Date   WBC 5.4 10/16/2021   HGB 12.5 10/16/2021   HCT 37.3 10/16/2021   MCV 89 10/16/2021   PLT 379 10/16/2021   Lab Results  Component Value Date   IRON 82 05/30/2021   FERRITIN 290.9 05/30/2021   Attestation Statements:   Reviewed by clinician on day of visit: allergies, medications, problem list, medical history, surgical history, family history, social history, and previous encounter notes.  I, Brendell Tyus, RMA, am acting April transcriptionist for Mina Marble, NP.  I have reviewed the above documentation for accuracy and completeness, and I agree with the above. -  Wojciech Willetts d. Jolane Bankhead, NP-C

## 2022-05-07 ENCOUNTER — Telehealth (INDEPENDENT_AMBULATORY_CARE_PROVIDER_SITE_OTHER): Payer: Self-pay | Admitting: Adult Health

## 2022-05-07 ENCOUNTER — Encounter (INDEPENDENT_AMBULATORY_CARE_PROVIDER_SITE_OTHER): Payer: Self-pay

## 2022-05-07 NOTE — Telephone Encounter (Signed)
Mina Marble - Prior authorization approved for [employer]. Effective: 05/03/2022 to 11/22/2022. Patient sent approval message via mychart.

## 2022-05-17 NOTE — Progress Notes (Signed)
[facility] [place] Phone: [phone] Subjective:   [name], am serving as a scribe for Dr. Hulan Saas.   I'm seeing this patient by the request  of:  Janith Lima, MD  CC: Neck and back pain follow-up  BPZ:[id number]  April [name] is a 46 y.o. female coming in with complaint of back and neck pain. OMT on 03/28/2022. Patient states that her neck did flare up last week. Lumbar spine pain seems to be worse than last visit. Standing for prolonged periods increases her pain.   Medications patient has been prescribed: None  Taking:         Reviewed prior external information including notes and imaging from previsou exam, outside providers and external EMR if available.   As well as notes that were available from care everywhere and other healthcare systems.  Past medical history, social, surgical and family history all reviewed in electronic medical record.  No pertanent information unless stated regarding to the chief complaint.   Past Medical History:  Diagnosis Date   ALLERGIC RHINITIS    Anal fissure    Anxiety    ANXIETY DEPRESSION    Back pain    CONSTIPATION    Constipation    DEPRESSION    Esophageal reflux    Food allergy    Avacados   Gastritis    GERD (gastroesophageal reflux disease)    HYPERCHOLESTEROLEMIA    Hyperlipidemia    Hypertension    OBESITY    Occipital neuralgia    Palpitations    Rheumatoid arthritis ([place]) 12/24/2017    Allergies  Allergen Reactions   Ciprofloxacin     toxicity   Levaquin [Levofloxacin] Anxiety    Possible CNS side effects. TOXICITY   Moxifloxacin     TOXICITY   Avocado     Other reaction(s): Unknown     Review of Systems:  No headache, visual changes, nausea, vomiting, diarrhea, constipation, dizziness, abdominal pain, skin rash, fevers, chills, night sweats, weight loss, swollen lymph nodes, body aches, joint swelling, chest  pain, shortness of breath, mood changes. POSITIVE muscle aches  Objective  Blood pressure 120/78, pulse (!) 105, height '5\' 10"'$  (1.778 m), weight 221 lb (100.2 kg).   General: No apparent distress alert and oriented x3 mood and affect normal, dressed appropriately.  HEENT: Pupils equal, extraocular movements intact  Respiratory: Patient's speak in full sentences and does not appear short of breath  Cardiovascular: No lower extremity edema, non tender, no erythema  Gait MSK:  Back does have tenderness still over the sacroiliac joints bilaterally right greater than left.  Positive Faber on the right side.  Negative straight leg test.  Patient does have tightness of the hip flexors noted.  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  Discussed icing regimen and home exercises.  Discussed which activities to do and which ones to avoid.  Still taking the Celebrex as needed has gabapentin.  Follow-up with me again in 6 to 8 weeks    Nonallopathic problems  Decision today to treat with OMT was based on Physical Exam  After verbal consent patient was  treated with HVLA, ME, FPR techniques in cervical, rib, thoracic, lumbar, and sacral  areas  Patient tolerated the procedure well with improvement in symptoms  Patient given exercises, stretches and lifestyle modifications  See medications in patient instructions if given  Patient will follow up in 4-8 weeks             Note: This dictation was prepared with Dragon dictation along with smaller phrase technology. Any transcriptional errors that result from this process are unintentional.

## 2022-05-23 ENCOUNTER — Other Ambulatory Visit (HOSPITAL_COMMUNITY): Payer: Self-pay

## 2022-05-23 DIAGNOSIS — M0579 Rheumatoid arthritis with rheumatoid factor of multiple sites without organ or systems involvement: Secondary | ICD-10-CM | POA: Diagnosis not present

## 2022-05-23 LAB — CBC AND DIFFERENTIAL
HCT: 36 (ref 36–46)
Hemoglobin: 11.9 — AB (ref 12.0–16.0)
Platelets: 361 10*3/uL (ref 150–400)
WBC: 6.9

## 2022-05-23 LAB — COMPREHENSIVE METABOLIC PANEL
Albumin: 3.9 (ref 3.5–5.0)
Calcium: 9.3 (ref 8.7–10.7)

## 2022-05-24 ENCOUNTER — Other Ambulatory Visit (HOSPITAL_COMMUNITY): Payer: Self-pay

## 2022-05-24 LAB — BASIC METABOLIC PANEL
BUN: 14 (ref 4–21)
CO2: 23 — AB (ref 13–22)
Chloride: 105 (ref 99–108)
Creatinine: 0.7 (ref 0.5–1.1)
Glucose: 71
Potassium: 4.4 mEq/L (ref 3.5–5.1)
Sodium: 140 (ref 137–147)

## 2022-05-24 LAB — HEPATIC FUNCTION PANEL
ALT: 21 U/L (ref 7–35)
AST: 17 (ref 13–35)
Alkaline Phosphatase: 62 (ref 25–125)
Bilirubin, Total: 0.2

## 2022-05-24 LAB — HM HEPATITIS C SCREENING LAB: HM Hepatitis Screen: NEGATIVE

## 2022-05-27 ENCOUNTER — Other Ambulatory Visit (HOSPITAL_COMMUNITY): Payer: Self-pay

## 2022-05-28 ENCOUNTER — Ambulatory Visit: Payer: 59 | Admitting: Family Medicine

## 2022-05-28 ENCOUNTER — Other Ambulatory Visit: Payer: Self-pay | Admitting: Internal Medicine

## 2022-05-28 ENCOUNTER — Other Ambulatory Visit (HOSPITAL_COMMUNITY): Payer: Self-pay

## 2022-05-28 VITALS — BP 120/78 | HR 105 | Ht 70.0 in | Wt 221.0 lb

## 2022-05-28 DIAGNOSIS — M9902 Segmental and somatic dysfunction of thoracic region: Secondary | ICD-10-CM | POA: Diagnosis not present

## 2022-05-28 DIAGNOSIS — M9904 Segmental and somatic dysfunction of sacral region: Secondary | ICD-10-CM

## 2022-05-28 DIAGNOSIS — M9903 Segmental and somatic dysfunction of lumbar region: Secondary | ICD-10-CM

## 2022-05-28 DIAGNOSIS — M9901 Segmental and somatic dysfunction of cervical region: Secondary | ICD-10-CM

## 2022-05-28 DIAGNOSIS — M533 Sacrococcygeal disorders, not elsewhere classified: Secondary | ICD-10-CM | POA: Diagnosis not present

## 2022-05-28 DIAGNOSIS — M9908 Segmental and somatic dysfunction of rib cage: Secondary | ICD-10-CM | POA: Diagnosis not present

## 2022-05-28 MED ORDER — FENOFIBRATE 160 MG PO TABS
160.0000 mg | ORAL_TABLET | Freq: Every day | ORAL | 1 refills | Status: DC
  Filled 2022-05-28 – 2022-06-18 (×2): qty 90, 90d supply, fill #0
  Filled 2022-09-19: qty 90, 90d supply, fill #1

## 2022-05-28 MED ORDER — ESOMEPRAZOLE MAGNESIUM 40 MG PO CPDR
40.0000 mg | DELAYED_RELEASE_CAPSULE | Freq: Every day | ORAL | 1 refills | Status: DC
Start: 2022-05-28 — End: 2022-11-28
  Filled 2022-05-28: qty 90, 90d supply, fill #0
  Filled 2022-08-29: qty 90, 90d supply, fill #1

## 2022-05-28 MED ORDER — METHOTREXATE SODIUM CHEMO INJECTION 50 MG/2ML
25.0000 mg | INTRAMUSCULAR | 0 refills | Status: DC
  Filled 2022-05-28: qty 12, 84d supply, fill #0

## 2022-05-28 MED ORDER — ESCITALOPRAM OXALATE 20 MG PO TABS
20.0000 mg | ORAL_TABLET | Freq: Every day | ORAL | 1 refills | Status: DC
  Filled 2022-05-28 – 2022-08-27 (×2): qty 90, 90d supply, fill #0
  Filled 2022-11-28: qty 90, 90d supply, fill #1

## 2022-05-28 NOTE — Assessment & Plan Note (Signed)
Chronic problem with exacerbation.  Discussed icing regimen and home exercises.  Discussed which activities to do and which ones to avoid.  Still taking the Celebrex as needed has gabapentin.  Follow-up with me again in 6 to 8 weeks

## 2022-05-28 NOTE — Patient Instructions (Signed)
Good to see you Keep doing what you are doing See me in 6-8 weeks

## 2022-05-30 ENCOUNTER — Ambulatory Visit (INDEPENDENT_AMBULATORY_CARE_PROVIDER_SITE_OTHER): Payer: 59

## 2022-05-30 ENCOUNTER — Ambulatory Visit (INDEPENDENT_AMBULATORY_CARE_PROVIDER_SITE_OTHER): Payer: 59 | Admitting: Internal Medicine

## 2022-05-30 ENCOUNTER — Encounter: Payer: Self-pay | Admitting: Internal Medicine

## 2022-05-30 VITALS — BP 122/82 | HR 70 | Temp 98.3°F | Resp 16 | Wt 218.0 lb

## 2022-05-30 DIAGNOSIS — R002 Palpitations: Secondary | ICD-10-CM | POA: Diagnosis not present

## 2022-05-30 DIAGNOSIS — I10 Essential (primary) hypertension: Secondary | ICD-10-CM | POA: Diagnosis not present

## 2022-05-30 NOTE — Progress Notes (Signed)
Subjective:  [name], female    DOB: 08-25-1976  Age: 46 y.o. MRN: [id number]  CC: Hypertension and Palpitations   HPI April [name] presents for f/up -  She complains of a 1 month history of palpitations that she describes as a fluttering sensation.  Her Apple Watch has informed her that her pulse has been as high as 110.  She denies diaphoresis, dizziness, lightheadedness, presyncope, chest pain, or edema.  Outpatient Medications Prior to Visit  Medication Sig Dispense Refill   ascorbic acid (VITAMIN C) 1000 MG tablet Take 1,000 mg by mouth daily.     busPIRone (BUSPAR) 15 MG tablet Take 1 tablet (15 mg total) by mouth 2 (two) times daily. (Patient taking differently: Take 15 mg by mouth. Bid as needed) 180 tablet 1   celecoxib (CELEBREX) 200 MG capsule Take 1 capsule (200 mg total) by mouth 2 (two) times daily with food. (Patient taking differently: Take 200 mg by mouth 2 (two) times daily as needed.) 60 capsule 2   certolizumab pegol (CIMZIA) 2 X 200 MG/ML PSKT Use 1 injection subcutaneously every other week 2 each 2   cetirizine (ZYRTEC) 10 MG tablet Take 1 tablet (10 mg total) by mouth daily. 30 tablet 3   Cholecalciferol (VITAMIN D) 50 MCG (2000 UT) CAPS Take 1 capsule by mouth.     clobetasol ointment (TEMOVATE) 0.05 % APPLY TO AFFECTED AREA TWICE DAILY FOR 10 DAYS  0   escitalopram (LEXAPRO) 20 MG tablet Take 1 tablet (20 mg total) by mouth daily. 90 tablet 1   esomeprazole (NEXIUM) 40 MG capsule Take 1 capsule (40 mg total) by mouth daily. 90 capsule 1   etonogestrel-ethinyl estradiol (NUVARING) 0.12-0.015 MG/24HR vaginal ring REMOVE AND INSERT A NEW RING INTO THE VAGINA EVERY 4 WEEKS *USE CONTINUOUSLY* 3 each 4   famotidine (PEPCID) 20 MG tablet Take 20 mg by mouth at bedtime. Take 1 tablets by mouth daily at bedtime     fenofibrate 160 MG tablet Take 1 tablet (160 mg total) by mouth daily. 90 tablet 1   folic acid (FOLVITE) 1 MG tablet Take 1  tablet (1 mg total) by mouth daily. 90 tablet 4   folic acid (FOLVITE) 1 MG tablet Take 1 tablet (1 mg total) by mouth daily. 90 tablet 3   gabapentin (NEURONTIN) 300 MG capsule Take 1 capsule (300 mg total) by mouth daily. 90 capsule 1   ibuprofen (ADVIL) 800 MG tablet Take 1 tablet (800 mg total) by mouth every 8 (eight) hours as needed. 90 tablet 0   losartan (COZAAR) 50 MG tablet Take 1 tablet (50 mg total) by mouth daily. (Patient taking differently: Take 25 mg by mouth daily.) 90 tablet 1   MAGNESIUM MALATE PO Take 1 tablet by mouth at bedtime.     methotrexate 50 MG/2ML injection Inject 1 mL (25 mg total) into the skin once a week. 12 mL 0   montelukast (SINGULAIR) 10 MG tablet Take 1 tablet (10 mg total) by mouth at bedtime. 90 tablet 1   Multiple Vitamin (MULTIVITAMIN) tablet Take 1 tablet by mouth daily.     Omega-3 Fatty Acids (FISH OIL) 1000 MG CAPS Take 2 capsules by mouth 2 (two) times daily.      Semaglutide-Weight Management (WEGOVY) 0.25 MG/0.5ML SOAJ Inject 0.25 mg into the skin once a week. 2 mL 0   zolpidem (AMBIEN) 5 MG tablet Take 1 tablet (5 mg total) by mouth at bedtime as needed  for sleep. 90 tablet 1   No facility-administered medications prior to visit.    ROS Review of Systems  Constitutional: Negative.  Negative for diaphoresis and fatigue.  HENT: Negative.    Eyes: Negative.   Respiratory:  Negative for cough, chest tightness and shortness of breath.   Cardiovascular:  Positive for palpitations. Negative for chest pain and leg swelling.  Gastrointestinal:  Negative for abdominal pain, constipation, diarrhea, nausea and vomiting.  Endocrine: Negative.   Genitourinary: Negative.  Negative for difficulty urinating.  Musculoskeletal: Negative.  Negative for arthralgias, back pain, myalgias and neck pain.  Skin:  Negative for color change and pallor.  Neurological:  Negative for dizziness, weakness and headaches.  Hematological:  Negative for adenopathy. Does not  bruise/bleed easily.  Psychiatric/Behavioral: Negative.      Objective:  BP 122/82 (BP Location: Right Arm, Patient Position: Sitting, Cuff Size: Normal)   Pulse 70   Temp 98.3 F (36.8 C) (Oral)   Resp 16   Wt 218 lb (98.9 kg)   SpO2 96%   BMI 31.28 kg/m   BP Readings from Last 3 Encounters:  05/30/22 122/82  05/28/22 120/78  05/02/22 124/80    Wt Readings from Last 3 Encounters:  05/30/22 218 lb (98.9 kg)  05/28/22 221 lb (100.2 kg)  05/02/22 214 lb (97.1 kg)    Physical Exam Vitals reviewed.  HENT:     Nose: Nose normal.     Mouth/Throat:     Mouth: Mucous membranes are moist.  Eyes:     General: No scleral icterus.    Conjunctiva/sclera: Conjunctivae normal.  Cardiovascular:     Rate and Rhythm: Normal rate and regular rhythm.     Heart sounds: Normal heart sounds, S1 normal and S2 normal. No murmur heard.    Comments: EKG- NSR, 78 bpm Normal EKG Pulmonary:     Effort: Pulmonary effort is normal.     Breath sounds: No stridor. No wheezing, rhonchi or rales.  Abdominal:     General: Abdomen is flat.     Palpations: There is no mass.     Tenderness: There is no abdominal tenderness. There is no guarding or rebound.     Hernia: No hernia is present.  Musculoskeletal:        General: Normal range of motion.     Cervical back: Neck supple.     Right lower leg: No edema.     Left lower leg: No edema.  Lymphadenopathy:     Cervical: No cervical adenopathy.  Skin:    General: Skin is warm and dry.  Neurological:     General: No focal deficit present.     Mental Status: She is alert.  Psychiatric:        Mood and Affect: Mood normal.        Behavior: Behavior normal.     Lab Results  Component Value Date   WBC 6.9 05/24/2022   HGB 11.9 (A) 05/24/2022   HCT 36 05/24/2022   PLT 361 05/24/2022   GLUCOSE 72 03/18/2022   CHOL 193 03/18/2022   TRIG 142 03/18/2022   HDL 62 03/18/2022   LDLDIRECT 96.0 05/26/2019   LDLCALC 106 (H) 03/18/2022   ALT 21  05/24/2022   AST 17 05/24/2022   NA 140 05/24/2022   K 4.4 05/24/2022   CL 105 05/24/2022   CREATININE 0.7 05/24/2022   BUN 14 05/24/2022   CO2 23 (A) 05/24/2022   TSH 0.955 05/17/2021   HGBA1C 5.3  03/18/2022    US Abdomen Limited RUQ (LIVER/GB)  Result Date: 06/29/2021 CLINICAL DATA:  Elevated LFTs EXAM: ULTRASOUND ABDOMEN LIMITED RIGHT UPPER QUADRANT COMPARISON:  None. FINDINGS: Gallbladder: No gallstones or wall thickening visualized. No sonographic Murphy sign noted by sonographer. Common bile duct: Diameter: 0.3 cm Liver: No focal lesion identified. Within normal limits in parenchymal echogenicity. Portal vein is patent on color Doppler imaging with normal direction of blood flow towards the liver. Other: None. IMPRESSION: Normal ultrasound of the right upper quadrant. Electronically Signed   By: Merilyn Baba M.D.   On: 06/29/2021 13:19    Assessment & Plan:   Chrissie was seen today for hypertension and palpitations.  Diagnoses and all orders for this visit:  Heart palpitations- Her EKG is normal today.  She is having palpitations nearly every day so I recommended that she wear a 72-hour Holter monitor. -     EKG 12-Lead -     Cancel: Holter monitor - 72 hour; Future -     LONG TERM MONITOR (3-14 DAYS); Future  Benign essential hypertension- Her blood pressure is adequately well controlled. -     EKG 12-Lead   I am having Jeanenne S. Alford Highland maintain her multivitamin, cetirizine, Fish Oil, ascorbic acid, clobetasol ointment, MAGNESIUM MALATE PO, celecoxib, busPIRone, zolpidem, ibuprofen, famotidine, folic acid, etonogestrel-ethinyl estradiol, Cimzia, montelukast, Vitamin D, gabapentin, folic acid, losartan, Wegovy, esomeprazole, fenofibrate, escitalopram, and methotrexate.  No orders of the defined types were placed in this encounter.    Follow-up: Return in about 3 months (around 08/30/2022).  Scarlette Calico, MD

## 2022-05-30 NOTE — Patient Instructions (Signed)
Palpitations Palpitations are feelings that your heartbeat is irregular or is faster than normal. It may feel like your heart is fluttering or skipping a beat. Palpitations may be caused by many things, including smoking, caffeine, alcohol, stress, and certain medicines or drugs. Most causes of palpitations are not serious.  However, some palpitations can be a sign of a serious problem. Further tests and a thorough medical history will be done to find the cause of your palpitations. Your provider may order tests such as an ECG, labs, an echocardiogram, or an ambulatory continuous ECG monitor. Follow these instructions at home: Pay attention to any changes in your symptoms. Let your health care provider know about them. Take these actions to help manage your symptoms: Eating and drinking Follow instructions from your health care provider about eating or drinking restrictions. You may need to avoid foods and drinks that may cause palpitations. These may include: Caffeinated coffee, tea, soft drinks, and energy drinks. Chocolate. Alcohol. Diet pills. Lifestyle     Take steps to reduce your stress and anxiety. Things that can help you relax include: Yoga. Mind-body activities, such as deep breathing, meditation, or using words and images to create positive thoughts (guided imagery). Physical activity, such as swimming, jogging, or walking. Tell your health care provider if your palpitations increase with activity. If you have chest pain or shortness of breath with activity, do not continue the activity until you are seen by your health care provider. Biofeedback. This is a method that helps you learn to use your mind to control things in your body, such as your heartbeat. Get plenty of rest and sleep. Keep a regular bed time. Do not use drugs, including cocaine or ecstasy. Do not use marijuana. Do not use any products that contain nicotine or tobacco. These products include cigarettes, chewing  tobacco, and vaping devices, such as e-cigarettes. If you need help quitting, ask your health care provider. General instructions Take over-the-counter and prescription medicines only as told by your health care provider. Keep all follow-up visits. This is important. These may include visits for further testing if palpitations do not go away or get worse. Contact a health care provider if: You continue to have a fast or irregular heartbeat for a long period of time. You notice that your palpitations occur more often. Get help right away if: You have chest pain or shortness of breath. You have a severe headache. You feel dizzy or you faint. These symptoms may represent a serious problem that is an emergency. Do not wait to see if the symptoms will go away. Get medical help right away. Call your local emergency services (911 in the [place] Do not drive yourself to the hospital. Summary Palpitations are feelings that your heartbeat is irregular or is faster than normal. It may feel like your heart is fluttering or skipping a beat. Palpitations may be caused by many things, including smoking, caffeine, alcohol, stress, certain medicines, and drugs. Further tests and a thorough medical history may be done to find the cause of your palpitations. Get help right away if you faint or have chest pain, shortness of breath, severe headache, or dizziness. This information is not intended to replace advice given to you by your health care provider. Make sure you discuss any questions you have with your health care provider. Document Revised: 03/28/2021 Document Reviewed: 03/28/2021 Elsevier Patient Education  2023 Elsevier Inc.  

## 2022-05-30 NOTE — Progress Notes (Unsigned)
Enrolled for [employer] to mail a ZIO XT long term holter monitor to the patients address on file.   DOD to read. 

## 2022-06-02 DIAGNOSIS — R002 Palpitations: Secondary | ICD-10-CM

## 2022-06-04 ENCOUNTER — Other Ambulatory Visit (HOSPITAL_COMMUNITY): Payer: Self-pay

## 2022-06-06 ENCOUNTER — Ambulatory Visit (INDEPENDENT_AMBULATORY_CARE_PROVIDER_SITE_OTHER): Payer: 59 | Admitting: Adult Health

## 2022-06-06 ENCOUNTER — Encounter (INDEPENDENT_AMBULATORY_CARE_PROVIDER_SITE_OTHER): Payer: Self-pay | Admitting: Adult Health

## 2022-06-06 ENCOUNTER — Other Ambulatory Visit (HOSPITAL_COMMUNITY): Payer: Self-pay

## 2022-06-06 VITALS — BP 112/68 | HR 86 | Temp 97.9°F | Ht 70.0 in | Wt 218.0 lb

## 2022-06-06 DIAGNOSIS — E669 Obesity, unspecified: Secondary | ICD-10-CM | POA: Diagnosis not present

## 2022-06-06 DIAGNOSIS — R002 Palpitations: Secondary | ICD-10-CM

## 2022-06-06 DIAGNOSIS — Z9189 Other specified personal risk factors, not elsewhere classified: Secondary | ICD-10-CM

## 2022-06-06 DIAGNOSIS — R632 Polyphagia: Secondary | ICD-10-CM | POA: Diagnosis not present

## 2022-06-06 DIAGNOSIS — Z6831 Body mass index (BMI) 31.0-31.9, adult: Secondary | ICD-10-CM | POA: Diagnosis not present

## 2022-06-06 DIAGNOSIS — E66812 Obesity, class 2: Secondary | ICD-10-CM

## 2022-06-06 MED ORDER — METFORMIN HCL 500 MG PO TABS
ORAL_TABLET | ORAL | 0 refills | Status: DC
  Filled 2022-06-06: qty 60, 30d supply, fill #0

## 2022-06-11 NOTE — Progress Notes (Signed)
Chief Complaint:   OBESITY April [name] is here to discuss her progress with her obesity treatment plan along with follow-up of her obesity related diagnoses. April [name] is on keeping a food journal and adhering to recommended goals of 1700-1900 calories and 110 grams of protein daily and states she is following her eating plan approximately 75% of the time. April [name] states she is on the treadmill, elliptical, and DVD workout videos for 30-45 minutes 3-4 times per week.  Today's visit was #: 18 Starting weight: 263 lbs Starting date: 05/17/2021 Today's weight: 218 lbs Today's date: 06/06/2022 Total lbs lost to date: 45 Total lbs lost since last in-office visit: 0  Interim History:  April [name] is consistently tracking, however she continues to eat over her calorie goal of 1900.  She will often consume 3000 cal/day.  Prior authorization for [facility] therapy 05/07/2022 - 12/07/2022.   On 05/30/2022 Dr. Ronnald Ramp, PCP ordered 72-hour Holter monitor for near daily palpitations.  Subjective:   1. Palpitations Rodina just completed a 72-hour Holter study per her PCP.   She believes palpitations have been occurring for 20 years with noticeable increased frequency in the last 3 to 4 years.   Caffeine intake-12 ounces of half calf coffee in the morning and up to 20 ounces of Coke 0.   Per patient, she is sensitive to caffeine.  2. Polyphagia Prior authorization was approved for Wegovy from 04/2022-11/2022- unable to fill Rx due to [employer]. April [name] has been off metformin for the past 3 weeks and she wants to restart.  3. At risk for diarrhea April [name] is at higher risk of diarrhea due to restarting metformin.  Assessment/Plan:   1. Palpitations Reduce caffeine intake. F/u with PCP, re: Holter Study results  2. Polyphagia April [name] agreed to restart metformin 500 mg for 1 week then increase to 500 mg twice daily with meals, and we will refill for 1 month.  - metFORMIN (GLUCOPHAGE) 500 MG  tablet; Take 1 tablet by mouth daily with food for 7 days, THEN increase to 1 tablet 2  times daily with meals  Dispense: 60 tablet; Refill: 0  3. At risk for diarrhea April [name] was given approximately 15 minutes of diarrhea prevention counseling today. She is 46 y.o. female and has risk factors for diarrhea including medications and changes in diet. We discussed intensive lifestyle modifications today with an emphasis on specific weight loss instructions including dietary strategies.   Repetitive spaced learning was employed today to elicit superior memory formation and behavioral change.  4. Obesity with current BMI of 31.4 April [name] is currently in the action stage of change. As such, her goal is to continue with weight loss efforts. She has agreed to keeping a food journal and adhering to recommended goals of 1900 calories and 110 grams of protein daily.   Call pharmacy weekly about obtaining [facility] Rx.  Exercise goals: As is.   Behavioral modification strategies: increasing lean protein intake, decreasing simple carbohydrates, meal planning and cooking strategies, keeping healthy foods in the home, and keeping a strict food journal.  April [name] has agreed to follow-up with our clinic in 4 weeks. She was informed of the importance of frequent follow-up visits to maximize her success with intensive lifestyle modifications for her multiple health conditions.   Objective:   Blood pressure 112/68, pulse 86, temperature 97.9 F (36.6 C), height '5\' 10"'$  (1.778 m), weight 218 lb (98.9 kg), SpO2 97 %. Body mass index is 31.28 kg/m.  General: Cooperative, alert, well developed,  in no acute distress. HEENT: Conjunctivae and lids unremarkable. Cardiovascular: Regular rhythm.  Lungs: Normal work of breathing. Neurologic: No focal deficits.   Lab Results  Component Value Date   CREATININE 0.7 05/24/2022   BUN 14 05/24/2022   NA 140 05/24/2022   K 4.4 05/24/2022   CL 105 05/24/2022   CO2 23 (A)  05/24/2022   Lab Results  Component Value Date   ALT 21 05/24/2022   AST 17 05/24/2022   ALKPHOS 62 05/24/2022   BILITOT 0.3 03/18/2022   Lab Results  Component Value Date   HGBA1C 5.3 03/18/2022   HGBA1C 5.4 10/16/2021   HGBA1C 5.7 (H) 05/17/2021   HGBA1C 5.4 05/26/2020   HGBA1C 5.5 05/26/2019   Lab Results  Component Value Date   INSULIN 5.7 03/18/2022   INSULIN 9.4 10/16/2021   INSULIN 16.3 05/17/2021   Lab Results  Component Value Date   TSH 0.955 05/17/2021   Lab Results  Component Value Date   CHOL 193 03/18/2022   HDL 62 03/18/2022   LDLCALC 106 (H) 03/18/2022   LDLDIRECT 96.0 05/26/2019   TRIG 142 03/18/2022   CHOLHDL 3.1 03/18/2022   Lab Results  Component Value Date   VD25OH 120.0 (H) 03/18/2022   VD25OH 96.3 10/16/2021   VD25OH 75.2 05/17/2021   Lab Results  Component Value Date   WBC 6.9 05/24/2022   HGB 11.9 (A) 05/24/2022   HCT 36 05/24/2022   MCV 89 10/16/2021   PLT 361 05/24/2022   Lab Results  Component Value Date   IRON 82 05/30/2021   FERRITIN 290.9 05/30/2021   Attestation Statements:   Reviewed by clinician on day of visit: allergies, medications, problem list, medical history, surgical history, family history, social history, and previous encounter notes.   Wilhemena Durie, am acting as transcriptionist for Mina Marble, NP.  I have reviewed the above documentation for accuracy and completeness, and I agree with the above. -  Chandrea Zellman d. Aditya Nastasi, NP-C

## 2022-06-12 DIAGNOSIS — R002 Palpitations: Secondary | ICD-10-CM | POA: Diagnosis not present

## 2022-06-18 ENCOUNTER — Other Ambulatory Visit (HOSPITAL_COMMUNITY): Payer: Self-pay

## 2022-06-19 ENCOUNTER — Other Ambulatory Visit: Payer: Self-pay | Admitting: Pharmacist

## 2022-06-19 ENCOUNTER — Other Ambulatory Visit (HOSPITAL_COMMUNITY): Payer: Self-pay

## 2022-06-19 MED ORDER — CIMZIA 2 X 200 MG/ML ~~LOC~~ PSKT: PREFILLED_SYRINGE | SUBCUTANEOUS | 5 refills | Status: DC

## 2022-06-19 MED ORDER — CIMZIA 2 X 200 MG/ML ~~LOC~~ PSKT
PREFILLED_SYRINGE | SUBCUTANEOUS | 5 refills | Status: DC
  Filled 2022-06-19: qty 1, 28d supply, fill #0
  Filled 2022-07-29: qty 1, 28d supply, fill #1
  Filled 2022-08-20: qty 1, 28d supply, fill #2
  Filled 2022-11-07: qty 1, 28d supply, fill #3
  Filled 2022-12-11: qty 1, 28d supply, fill #4
  Filled 2023-01-23: qty 1, 28d supply, fill #5
  Filled 2023-02-12: qty 2, 28d supply, fill #5

## 2022-06-26 ENCOUNTER — Encounter (INDEPENDENT_AMBULATORY_CARE_PROVIDER_SITE_OTHER): Payer: Self-pay

## 2022-07-02 ENCOUNTER — Other Ambulatory Visit (HOSPITAL_COMMUNITY): Payer: Self-pay

## 2022-07-04 NOTE — Progress Notes (Unsigned)
[facility] [place] [address] [place] Phone: [phone] Subjective:   [name], am serving as a scribe for Dr. Hulan Saas.  I'm seeing this patient by the request  of:  Janith Lima, MD  CC: Back and neck pain  [id number]  Isebella MINEOLA DUAN is a 46 y.o. female coming in with complaint of back and neck pain. OMT on 05/28/2022. Patient states doing well. No new issues.Does feel like that she may be having a flare of her rheumatoid arthritis.  Feels like her hands are tighter than usual.  Medications patient has been prescribed: None        Reviewed prior external information including notes and imaging from previsou exam, outside providers and external EMR if available.   As well as notes that were available from care everywhere and other healthcare systems.  Patient has been going to a weight loss clinic.  Patient has also been going to cardiology for her palpitations  Past medical history, social, surgical and family history all reviewed in electronic medical record.  No pertanent information unless stated regarding to the chief complaint.   Past Medical History:  Diagnosis Date   ALLERGIC RHINITIS    Anal fissure    Anxiety    ANXIETY DEPRESSION    Back pain    CONSTIPATION    Constipation    DEPRESSION    Esophageal reflux    Food allergy    Avacados   Gastritis    GERD (gastroesophageal reflux disease)    HYPERCHOLESTEROLEMIA    Hyperlipidemia    Hypertension    OBESITY    Occipital neuralgia    Palpitations    Rheumatoid arthritis ([place]) 12/24/2017    Allergies  Allergen Reactions   Ciprofloxacin     toxicity   Levaquin [Levofloxacin] Anxiety    Possible CNS side effects. TOXICITY   Moxifloxacin     TOXICITY   Avocado     Other reaction(s): Unknown     Review of Systems:  No headache, visual changes, nausea, vomiting, diarrhea, constipation, dizziness, abdominal pain, skin rash, fevers,  chills, night sweats, weight loss, swollen lymph nodes, body aches, joint swelling, chest pain, shortness of breath, mood changes. POSITIVE muscle aches  Objective  Blood pressure 118/74, pulse 72, height '5\' 10"'$  (1.778 m), weight 224 lb (101.6 kg), SpO2 98 %.   General: No apparent distress alert and oriented x3 mood and affect normal, dressed appropriately.  HEENT: Pupils equal, extraocular movements intact  Respiratory: Patient's speak in full sentences and does not appear short of breath  Cardiovascular: No lower extremity edema, non tender, no erythema  Gait MSK:  Back does have some mild loss of lordosis.  Some tenderness to palpation of the paraspinal musculature.  Patient does have tightness with FABER test bilaterally right greater than left.  Patient does have a trace effusion of the PIP joints of the hands bilaterally.  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  Concern for the underlying rheumatoid arthritis possibly may be worsening as well.  Patient will monitor.  Please take the supplementation with glutamine.  Patient is continuing on the methotrexate.  In doing we will go over it as well.  We discussed with patient the Celebrex can continue taking it as needed.  Follow-up with me again in 6 weeks    Nonallopathic problems  Decision today to treat with OMT was based on Physical Exam  After verbal consent patient was treated with HVLA, ME, FPR techniques in cervical, rib, thoracic, lumbar, and sacral  areas  Patient tolerated the procedure well with improvement in symptoms  Patient given exercises, stretches and lifestyle modifications  See medications  in patient instructions if given  Patient will follow up in 4-8 weeks    The above documentation has been reviewed and is accurate and complete Lyndal Pulley, DO          Note: This dictation was prepared with Dragon dictation along with smaller phrase technology. Any transcriptional errors that result from this process are unintentional.

## 2022-07-05 ENCOUNTER — Other Ambulatory Visit (HOSPITAL_COMMUNITY): Payer: Self-pay

## 2022-07-08 ENCOUNTER — Ambulatory Visit (INDEPENDENT_AMBULATORY_CARE_PROVIDER_SITE_OTHER): Payer: 59 | Admitting: Adult Health

## 2022-07-08 ENCOUNTER — Other Ambulatory Visit (HOSPITAL_COMMUNITY): Payer: Self-pay

## 2022-07-08 ENCOUNTER — Encounter (INDEPENDENT_AMBULATORY_CARE_PROVIDER_SITE_OTHER): Payer: Self-pay | Admitting: Adult Health

## 2022-07-08 VITALS — BP 121/82 | HR 73 | Temp 97.9°F | Ht 70.0 in | Wt 219.0 lb

## 2022-07-08 DIAGNOSIS — Z6831 Body mass index (BMI) 31.0-31.9, adult: Secondary | ICD-10-CM | POA: Diagnosis not present

## 2022-07-08 DIAGNOSIS — R632 Polyphagia: Secondary | ICD-10-CM

## 2022-07-08 DIAGNOSIS — E669 Obesity, unspecified: Secondary | ICD-10-CM | POA: Diagnosis not present

## 2022-07-08 MED ORDER — METFORMIN HCL 500 MG PO TABS: 500.0000 mg | ORAL_TABLET | Freq: Every day | ORAL | 0 refills | Status: DC

## 2022-07-09 ENCOUNTER — Ambulatory Visit: Payer: 59 | Admitting: Family Medicine

## 2022-07-09 VITALS — BP 118/74 | HR 72 | Ht 70.0 in | Wt 224.0 lb

## 2022-07-09 DIAGNOSIS — M9904 Segmental and somatic dysfunction of sacral region: Secondary | ICD-10-CM | POA: Diagnosis not present

## 2022-07-09 DIAGNOSIS — M069 Rheumatoid arthritis, unspecified: Secondary | ICD-10-CM | POA: Diagnosis not present

## 2022-07-09 DIAGNOSIS — M533 Sacrococcygeal disorders, not elsewhere classified: Secondary | ICD-10-CM | POA: Diagnosis not present

## 2022-07-09 DIAGNOSIS — M9903 Segmental and somatic dysfunction of lumbar region: Secondary | ICD-10-CM

## 2022-07-09 DIAGNOSIS — M9901 Segmental and somatic dysfunction of cervical region: Secondary | ICD-10-CM

## 2022-07-09 DIAGNOSIS — M9908 Segmental and somatic dysfunction of rib cage: Secondary | ICD-10-CM

## 2022-07-09 DIAGNOSIS — M9902 Segmental and somatic dysfunction of thoracic region: Secondary | ICD-10-CM | POA: Diagnosis not present

## 2022-07-09 NOTE — Patient Instructions (Signed)
Good to see you! If anything worsen you know where I am

## 2022-07-09 NOTE — Assessment & Plan Note (Signed)
Sacroiliac dysfunction with worsening symptoms.  Concern for the underlying rheumatoid arthritis possibly may be worsening as well.  Patient will monitor.  Please take the supplementation with glutamine.  Patient is continuing on the methotrexate.  In doing we will go over it as well.  We discussed with patient the Celebrex can continue taking it as needed.  Follow-up with me again in 6 weeks

## 2022-07-10 ENCOUNTER — Encounter: Payer: Self-pay | Admitting: Family Medicine

## 2022-07-12 NOTE — Progress Notes (Unsigned)
Chief Complaint:   OBESITY April [name] is here to discuss her progress with her obesity treatment plan along with follow-up of her obesity related diagnoses. April [name] is on and states she is following her eating plan approximately 10% of the time. April [name] states she is She has agreed to keeping a food journal and adhering to recommended goals of 1900 calories and 110 grams of protein daily.  Doing cardio 30-45 minutes 3 times per week.  Today's visit was #: 12 Starting weight: 263 lbs Starting date: 05/17/2021 Today's weight: 219 lbs Today's date: 07/08/2022 Total lbs lost to date: 44 lbs Total lbs lost since last in-office visit: +1 lb  Interim History: She will pick up Wegovy 0.25 mg prescription today.  She is currently on Metformin 500 mg daily.  She is unable to remember to take BID.   Subjective:   1. Polyphagia ***  Assessment/Plan:   1. Polyphagia Start [place] therapy as directed.   Continue - metFORMIN (GLUCOPHAGE) 500 MG tablet; Take 1 tablet (500 mg total) by mouth daily with breakfast.  Dispense: 30 tablet; Refill: 0  2. Obesity with current BMI of 31.5 Start Wegovy 0.25 mg once weekly.  *** on Metformin 500 mg daily.  April [name] is currently in the action stage of change. As such, her goal is to continue with weight loss efforts. She has agreed to She has agreed to keeping a food journal and adhering to recommended goals of 1700-1900 calories and 110 grams of protein daily.    Exercise goals:  As is.   Behavioral modification strategies: increasing lean protein intake, decreasing simple carbohydrates, meal planning and cooking strategies, keeping healthy foods in the home, and planning for success.  April [name] has agreed to follow-up with our clinic in 4 weeks. She was informed of the importance of frequent follow-up visits to maximize her success with intensive lifestyle modifications for her multiple health conditions.   Objective:   Blood pressure 121/82, pulse  73, temperature 97.9 F (36.6 C), height '5\' 10"'$  (1.778 m), weight 219 lb (99.3 kg), SpO2 98 %. Body mass index is 31.42 kg/m.  General: Cooperative, alert, well developed, in no acute distress. HEENT: Conjunctivae and lids unremarkable. Cardiovascular: Regular rhythm.  Lungs: Normal work of breathing. Neurologic: No focal deficits.   Lab Results  Component Value Date   CREATININE 0.7 05/24/2022   BUN 14 05/24/2022   NA 140 05/24/2022   K 4.4 05/24/2022   CL 105 05/24/2022   CO2 23 (A) 05/24/2022   Lab Results  Component Value Date   ALT 21 05/24/2022   AST 17 05/24/2022   ALKPHOS 62 05/24/2022   BILITOT 0.3 03/18/2022   Lab Results  Component Value Date   HGBA1C 5.3 03/18/2022   HGBA1C 5.4 10/16/2021   HGBA1C 5.7 (H) 05/17/2021   HGBA1C 5.4 05/26/2020   HGBA1C 5.5 05/26/2019   Lab Results  Component Value Date   INSULIN 5.7 03/18/2022   INSULIN 9.4 10/16/2021   INSULIN 16.3 05/17/2021   Lab Results  Component Value Date   TSH 0.955 05/17/2021   Lab Results  Component Value Date   CHOL 193 03/18/2022   HDL 62 03/18/2022   LDLCALC 106 (H) 03/18/2022   LDLDIRECT 96.0 05/26/2019   TRIG 142 03/18/2022   CHOLHDL 3.1 03/18/2022   Lab Results  Component Value Date   VD25OH 120.0 (H) 03/18/2022   VD25OH 96.3 10/16/2021   VD25OH 75.2 05/17/2021   Lab Results  Component Value Date  WBC 6.9 05/23/2022   HGB 11.9 (A) 05/23/2022   HCT 36 05/23/2022   MCV 89 10/16/2021   PLT 361 05/23/2022   Lab Results  Component Value Date   IRON 82 05/30/2021   FERRITIN 290.9 05/30/2021   Attestation Statements:   Reviewed by clinician on day of visit: allergies, medications, problem list, medical history, surgical history, family history, social history, and previous encounter notes.  I, Davy Pique, RMA, am acting as Location manager for Mina Marble, NP.  I have reviewed the above documentation for accuracy and completeness, and I agree with the above. -  ***

## 2022-07-19 ENCOUNTER — Other Ambulatory Visit (HOSPITAL_COMMUNITY): Payer: Self-pay

## 2022-07-25 ENCOUNTER — Other Ambulatory Visit (HOSPITAL_COMMUNITY): Payer: Self-pay

## 2022-07-29 ENCOUNTER — Other Ambulatory Visit (HOSPITAL_COMMUNITY): Payer: Self-pay

## 2022-07-30 ENCOUNTER — Other Ambulatory Visit (HOSPITAL_COMMUNITY): Payer: Self-pay

## 2022-08-01 ENCOUNTER — Ambulatory Visit: Payer: 59 | Attending: Internal Medicine | Admitting: Pharmacist

## 2022-08-01 DIAGNOSIS — Z79899 Other long term (current) drug therapy: Secondary | ICD-10-CM

## 2022-08-01 NOTE — Progress Notes (Signed)
   S: Patient presents today telephonically for review of their specialty medication.   Patient is taking Cimzia (certolizumab) for rheumatoid arthritis. She is managed by Gavin Pound for this.   Dosing: Note: Each 400 mg dose should be administered as 2 injections of 200 mg each. Rheumatoid arthritis: SubQ: Initial: 400 mg, repeat dose 2 and 4 weeks after initial dose; Maintenance: 200 mg every other week. May consider maintenance dose of 400 mg every 4 weeks. May be administered alone or in combination with methotrexate.  Efficacy: admits that the medication is starting to lose efficacy. She has a follow-up visit scheduled with her specialist in a couple of weeks and is going to discuss next steps at that appointment.   Monitoring: none  Adverse events: none  O:   Lab Results  Component Value Date   WBC 6.9 05/23/2022   HGB 11.9 (A) 05/23/2022   HCT 36 05/23/2022   MCV 89 10/16/2021   PLT 361 05/23/2022      Chemistry      Component Value Date/Time   NA 140 05/24/2022 0000   K 4.4 05/24/2022 0000   CL 105 05/24/2022 0000   CO2 23 (A) 05/24/2022 0000   BUN 14 05/24/2022 0000   CREATININE 0.7 05/24/2022 0000   CREATININE 0.75 03/18/2022 1054   CREATININE 0.87 05/26/2020 0841   GLU 71 05/24/2022 0000      Component Value Date/Time   CALCIUM 9.3 05/23/2022 0000   ALKPHOS 62 05/24/2022 0000   AST 17 05/24/2022 0000   ALT 21 05/24/2022 0000   BILITOT 0.3 03/18/2022 1054     A/P: 1. Medication review: patient currently taking Cimzia for rheumatoid arthritis. Reviewed the medication with the patient, including the following: Cimzia is a TNF blocking agent used in the treatment of rheumatoid arthritis. She will review videos on how to administer the medication. Possible adverse effects include GI upset, antibody development, increased risk of infection, hypersensitivity, demyelinating CNS disease, hematologic effects, and increased risk of malignancy. Use with caution in  patients with heart failure. Avoid use of live vaccinations and let doctor know of any infections as treatment with Cimzia may need to be held during illness. No recommendations for any changes.  Benard Halsted, PharmD, [place], [place] [phone]

## 2022-08-06 ENCOUNTER — Ambulatory Visit (INDEPENDENT_AMBULATORY_CARE_PROVIDER_SITE_OTHER): Payer: 59 | Admitting: Adult Health

## 2022-08-06 ENCOUNTER — Encounter (INDEPENDENT_AMBULATORY_CARE_PROVIDER_SITE_OTHER): Payer: Self-pay | Admitting: Adult Health

## 2022-08-06 VITALS — BP 132/85 | HR 80 | Temp 97.9°F | Ht 70.0 in | Wt 221.0 lb

## 2022-08-06 DIAGNOSIS — E669 Obesity, unspecified: Secondary | ICD-10-CM

## 2022-08-06 DIAGNOSIS — Z6831 Body mass index (BMI) 31.0-31.9, adult: Secondary | ICD-10-CM | POA: Diagnosis not present

## 2022-08-06 DIAGNOSIS — R632 Polyphagia: Secondary | ICD-10-CM | POA: Diagnosis not present

## 2022-08-06 MED ORDER — METFORMIN HCL 500 MG PO TABS: 500.0000 mg | ORAL_TABLET | Freq: Every day | ORAL | 0 refills | Status: DC

## 2022-08-07 ENCOUNTER — Other Ambulatory Visit (HOSPITAL_COMMUNITY): Payer: Self-pay

## 2022-08-07 NOTE — Progress Notes (Addendum)
Chief Complaint:   OBESITY April [name] is here to discuss her progress with her obesity treatment plan along with follow-up of her obesity related diagnoses. April [name] is on keeping a food journal and adhering to recommended goals of 1700-1900 calories and 110 protein and states she is following her eating plan approximately 10% of the time. April [name] states she is cardio 30-45 minutes 2 times per week.  Today's visit was #: 20 Starting weight: 263 lbs Starting date: 05/17/2021 Today's weight: 221 lbs Today's date: 08/06/2022 Total lbs lost to date: 42 lbs Total lbs lost since last in-office visit: +2 lbs  Interim History:  07/08/2022 started on Wegovy 0.25 mg once weekly injection. She has had 3 doses and experienced nausea, without vomiting.   She endorses quicker satiety. Due to national drug shortage, it may be difficult to obtain refill of [facility] prescription.  Bioimpedance:   Muscle Mass: +2 lbs Adipose Mass: no change    Subjective:   1. Polyphagia 07/08/2022 started on Wegovy 0.25 mg once weekly injection. She has had 3 doses and experienced nausea, without vomiting.   She endorses quicker satiety. Due to national drug shortage, it may be difficult to obtain refill of [facility] prescription. Remained on daily metformin 500 mg, denies GI upset with oral therapy.   Assessment/Plan:   1. Polyphagia Refill - metFORMIN (GLUCOPHAGE) 500 MG tablet; Take 1 tablet (500 mg total) by mouth daily with breakfast.  Dispense: 30 tablet; Refill: 0  2. Obesity with current BMI of 31.7 Refill and increase Wegovy 0.5mg  once week, Disp 2ml RF 0  Check fasting labs at next office visit.   April [name] is currently in the action stage of change. As such, her goal is to continue with weight loss efforts. She has agreed to keeping a food journal and adhering to recommended goals of 1700-1900 calories and 110 protein.   Exercise goals:  as is.   Behavioral modification strategies: increasing  lean protein intake, decreasing simple carbohydrates, meal planning and cooking strategies, keeping healthy foods in the home, and planning for success.  April [name] has agreed to follow-up with our clinic in 4 weeks. She was informed of the importance of frequent follow-up visits to maximize her success with intensive lifestyle modifications for her multiple health conditions.   Objective:   Blood pressure 132/85, pulse 80, temperature 97.9 F (36.6 C), height 5\' 10"  (1.778 m), weight 221 lb (100.2 kg), SpO2 99 %. Body mass index is 31.71 kg/m.  General: Cooperative, alert, well developed, in no acute distress. HEENT: Conjunctivae and lids unremarkable. Cardiovascular: Regular rhythm.  Lungs: Normal work of breathing. Neurologic: No focal deficits.   Lab Results  Component Value Date   CREATININE 0.7 05/24/2022   BUN 14 05/24/2022   NA 140 05/24/2022   K 4.4 05/24/2022   CL 105 05/24/2022   CO2 23 (A) 05/24/2022   Lab Results  Component Value Date   ALT 21 05/24/2022   AST 17 05/24/2022   ALKPHOS 62 05/24/2022   BILITOT 0.3 03/18/2022   Lab Results  Component Value Date   HGBA1C 5.3 03/18/2022   HGBA1C 5.4 10/16/2021   HGBA1C 5.7 (H) 05/17/2021   HGBA1C 5.4 05/26/2020   HGBA1C 5.5 05/26/2019   Lab Results  Component Value Date   INSULIN 5.7 03/18/2022   INSULIN 9.4 10/16/2021   INSULIN 16.3 05/17/2021   Lab Results  Component Value Date   TSH 0.955 05/17/2021   Lab Results  Component Value Date  CHOL 193 03/18/2022   HDL 62 03/18/2022   LDLCALC 106 (H) 03/18/2022   LDLDIRECT 96.0 05/26/2019   TRIG 142 03/18/2022   CHOLHDL 3.1 03/18/2022   Lab Results  Component Value Date   VD25OH 120.0 (H) 03/18/2022   VD25OH 96.3 10/16/2021   VD25OH 75.2 05/17/2021   Lab Results  Component Value Date   WBC 6.9 05/23/2022   HGB 11.9 (A) 05/23/2022   HCT 36 05/23/2022   MCV 89 10/16/2021   PLT 361 05/23/2022   Lab Results  Component Value Date   IRON 82  05/30/2021   FERRITIN 290.9 05/30/2021   Attestation Statements:   Reviewed by clinician on day of visit: allergies, medications, problem list, medical history, surgical history, family history, social history, and previous encounter notes.  I, Malcolm Metro, RMA, am acting as Energy manager for William Hamburger, NP.  I have reviewed the above documentation for accuracy and completeness, and I agree with the above. -  Airanna Partin d. Leelynd Maldonado, NP-C

## 2022-08-08 ENCOUNTER — Other Ambulatory Visit (HOSPITAL_COMMUNITY): Payer: Self-pay

## 2022-08-08 ENCOUNTER — Encounter (INDEPENDENT_AMBULATORY_CARE_PROVIDER_SITE_OTHER): Payer: Self-pay | Admitting: Adult Health

## 2022-08-08 ENCOUNTER — Other Ambulatory Visit (INDEPENDENT_AMBULATORY_CARE_PROVIDER_SITE_OTHER): Payer: Self-pay | Admitting: Adult Health

## 2022-08-08 DIAGNOSIS — R632 Polyphagia: Secondary | ICD-10-CM

## 2022-08-08 MED ORDER — WEGOVY 0.5 MG/0.5ML ~~LOC~~ SOAJ
0.5000 mg | SUBCUTANEOUS | 0 refills | Status: DC
  Filled 2022-08-08: qty 2, 28d supply, fill #0

## 2022-08-08 NOTE — Addendum Note (Signed)
Addended by: Mina Marble D on: 08/08/2022 02:04 PM   Modules accepted: Orders

## 2022-08-20 ENCOUNTER — Other Ambulatory Visit (HOSPITAL_COMMUNITY): Payer: Self-pay

## 2022-08-20 NOTE — Progress Notes (Unsigned)
[name] [place] Phone: [phone] Subjective:   Rito Ehrlich, am serving as a scribe for Dr. Hulan Saas.  I'm seeing this patient by the request  of:  Janith Lima, MD  CC: back and neck pain   [id number]  April [name] is a 46 y.o. female coming in with complaint of back and neck pain. OMT 07/09/2022. Patient states here for usual adjustment  Medications patient has been prescribed: None  Taking:         Reviewed prior external information including notes and imaging from previsou exam, outside providers and external EMR if available.   As well as notes that were available from care everywhere and other healthcare systems.  Past medical history, social, surgical and family history all reviewed in electronic medical record.  No pertanent information unless stated regarding to the chief complaint.   Past Medical History:  Diagnosis Date   ALLERGIC RHINITIS    Anal fissure    Anxiety    ANXIETY DEPRESSION    Back pain    CONSTIPATION    Constipation    DEPRESSION    Esophageal reflux    Food allergy    Avacados   Gastritis    GERD (gastroesophageal reflux disease)    HYPERCHOLESTEROLEMIA    Hyperlipidemia    Hypertension    OBESITY    Occipital neuralgia    Palpitations    Rheumatoid arthritis ([place]) 12/24/2017    Allergies  Allergen Reactions   Ciprofloxacin     toxicity   Levaquin [Levofloxacin] Anxiety    Possible CNS side effects. TOXICITY   Moxifloxacin     TOXICITY   Avocado     Other reaction(s): Unknown     Review of Systems:  No headache, visual changes, nausea, vomiting, diarrhea, constipation, dizziness, abdominal pain, skin rash, fevers, chills, night sweats, weight loss, swollen lymph nodes, body aches, joint swelling, chest pain, shortness of breath, mood changes. POSITIVE muscle aches  Objective  Blood pressure 130/80, pulse 76, height '5\' 10"'$  (1.778  m), weight 220 lb (99.8 kg), SpO2 97 %.   General: No apparent distress alert and oriented x3 mood and affect normal, dressed appropriately.  HEENT: Pupils equal, extraocular movements intact  Respiratory: Patient's speak in full sentences and does not appear short of breath  Cardiovascular: No lower extremity edema, non tender, no erythema  MSK:  Back: Low back does have some loss of lordosis.  More tightness noted around the parascapular region on the right side.  Patient does have what appears to be a slipped rib noted on the anterior chest wall as well.  No erythema noted.  Osteopathic findings  C2 flexed rotated and side bent right C6 flexed rotated and side bent right T3 extended rotated and side bent right inhaled rib T7 extended rotated and side bent right L2 flexed rotated and side bent right Sacrum right on right       Assessment and Plan:  Low back pain More tightness at this moment.  Discussed icing regimen and home exercises, which activities to do and which ones to avoid.  Increase activity slowly.  Follow-up again in 6 to 8 weeks.    Nonallopathic problems  Decision today to treat with OMT was based on Physical Exam  After verbal consent patient was treated with HVLA, ME, FPR techniques in cervical, rib, thoracic, lumbar, and sacral  areas  Patient tolerated the procedure well with improvement in  symptoms  Patient given exercises, stretches and lifestyle modifications  See medications in patient instructions if given  Patient will follow up in 4-8 weeks     The above documentation has been reviewed and is accurate and complete Lyndal Pulley, DO         Note: This dictation was prepared with Dragon dictation along with smaller phrase technology. Any transcriptional errors that result from this process are unintentional.

## 2022-08-21 ENCOUNTER — Ambulatory Visit: Payer: 59 | Admitting: Family Medicine

## 2022-08-21 VITALS — BP 130/80 | HR 76 | Ht 70.0 in | Wt 220.0 lb

## 2022-08-21 DIAGNOSIS — M9901 Segmental and somatic dysfunction of cervical region: Secondary | ICD-10-CM | POA: Diagnosis not present

## 2022-08-21 DIAGNOSIS — M9908 Segmental and somatic dysfunction of rib cage: Secondary | ICD-10-CM | POA: Diagnosis not present

## 2022-08-21 DIAGNOSIS — M5441 Lumbago with sciatica, right side: Secondary | ICD-10-CM

## 2022-08-21 DIAGNOSIS — M9904 Segmental and somatic dysfunction of sacral region: Secondary | ICD-10-CM | POA: Diagnosis not present

## 2022-08-21 DIAGNOSIS — M5442 Lumbago with sciatica, left side: Secondary | ICD-10-CM | POA: Diagnosis not present

## 2022-08-21 DIAGNOSIS — M9902 Segmental and somatic dysfunction of thoracic region: Secondary | ICD-10-CM | POA: Diagnosis not present

## 2022-08-21 DIAGNOSIS — M9903 Segmental and somatic dysfunction of lumbar region: Secondary | ICD-10-CM | POA: Diagnosis not present

## 2022-08-21 NOTE — Assessment & Plan Note (Signed)
More tightness at this moment.  Discussed icing regimen and home exercises, which activities to do and which ones to avoid.  Increase activity slowly.  Follow-up again in 6 to 8 weeks.

## 2022-08-21 NOTE — Patient Instructions (Addendum)
Good to see you  Good luck with the puppy Check out the cheese whizz  Follow up in 6 weeks

## 2022-08-22 ENCOUNTER — Other Ambulatory Visit (HOSPITAL_COMMUNITY): Payer: Self-pay

## 2022-08-22 ENCOUNTER — Ambulatory Visit: Payer: 59 | Admitting: Family Medicine

## 2022-08-22 DIAGNOSIS — E669 Obesity, unspecified: Secondary | ICD-10-CM | POA: Diagnosis not present

## 2022-08-22 DIAGNOSIS — Z6832 Body mass index (BMI) 32.0-32.9, adult: Secondary | ICD-10-CM | POA: Diagnosis not present

## 2022-08-22 DIAGNOSIS — M0579 Rheumatoid arthritis with rheumatoid factor of multiple sites without organ or systems involvement: Secondary | ICD-10-CM | POA: Diagnosis not present

## 2022-08-22 DIAGNOSIS — L409 Psoriasis, unspecified: Secondary | ICD-10-CM | POA: Diagnosis not present

## 2022-08-22 DIAGNOSIS — H15101 Unspecified episcleritis, right eye: Secondary | ICD-10-CM | POA: Diagnosis not present

## 2022-08-22 DIAGNOSIS — G43719 Chronic migraine without aura, intractable, without status migrainosus: Secondary | ICD-10-CM | POA: Diagnosis not present

## 2022-08-22 DIAGNOSIS — Z79899 Other long term (current) drug therapy: Secondary | ICD-10-CM | POA: Diagnosis not present

## 2022-08-22 MED ORDER — GABAPENTIN 300 MG PO CAPS
300.0000 mg | ORAL_CAPSULE | Freq: Every day | ORAL | 1 refills | Status: DC
  Filled 2022-08-22 – 2022-10-16 (×2): qty 90, 90d supply, fill #0

## 2022-08-22 MED ORDER — CELECOXIB 200 MG PO CAPS
200.0000 mg | ORAL_CAPSULE | Freq: Two times a day (BID) | ORAL | 1 refills | Status: AC | PRN
  Filled 2022-08-22: qty 180, 90d supply, fill #0
  Filled 2023-08-21 – 2023-08-22 (×2): qty 180, 90d supply, fill #1

## 2022-08-26 ENCOUNTER — Other Ambulatory Visit (HOSPITAL_COMMUNITY): Payer: Self-pay

## 2022-08-26 ENCOUNTER — Other Ambulatory Visit: Payer: Self-pay | Admitting: Internal Medicine

## 2022-08-26 MED ORDER — MONTELUKAST SODIUM 10 MG PO TABS
10.0000 mg | ORAL_TABLET | Freq: Every day | ORAL | 1 refills | Status: DC
  Filled 2022-08-26: qty 90, 90d supply, fill #0
  Filled 2022-11-28: qty 90, 90d supply, fill #1

## 2022-08-27 ENCOUNTER — Other Ambulatory Visit (HOSPITAL_COMMUNITY): Payer: Self-pay

## 2022-08-27 MED ORDER — METHOTREXATE SODIUM CHEMO INJECTION 50 MG/2ML
25.0000 mg | INTRAMUSCULAR | 0 refills | Status: DC
  Filled 2022-08-27: qty 12, 84d supply, fill #0

## 2022-08-29 ENCOUNTER — Other Ambulatory Visit (HOSPITAL_COMMUNITY): Payer: Self-pay

## 2022-09-17 ENCOUNTER — Encounter (INDEPENDENT_AMBULATORY_CARE_PROVIDER_SITE_OTHER): Payer: Self-pay | Admitting: Adult Health

## 2022-09-17 ENCOUNTER — Ambulatory Visit (INDEPENDENT_AMBULATORY_CARE_PROVIDER_SITE_OTHER): Payer: 59 | Admitting: Adult Health

## 2022-09-17 ENCOUNTER — Other Ambulatory Visit (HOSPITAL_COMMUNITY): Payer: Self-pay

## 2022-09-17 VITALS — BP 114/71 | HR 88 | Temp 98.0°F | Ht 70.0 in | Wt 220.0 lb

## 2022-09-17 DIAGNOSIS — E559 Vitamin D deficiency, unspecified: Secondary | ICD-10-CM | POA: Diagnosis not present

## 2022-09-17 DIAGNOSIS — E669 Obesity, unspecified: Secondary | ICD-10-CM | POA: Diagnosis not present

## 2022-09-17 DIAGNOSIS — R7303 Prediabetes: Secondary | ICD-10-CM

## 2022-09-17 DIAGNOSIS — Z6831 Body mass index (BMI) 31.0-31.9, adult: Secondary | ICD-10-CM

## 2022-09-17 DIAGNOSIS — R632 Polyphagia: Secondary | ICD-10-CM

## 2022-09-17 MED ORDER — METFORMIN HCL 500 MG PO TABS
500.0000 mg | ORAL_TABLET | Freq: Every day | ORAL | 0 refills | Status: DC
  Filled 2022-09-17: qty 30, 30d supply, fill #0

## 2022-09-18 NOTE — Progress Notes (Signed)
Chief Complaint:   OBESITY April [name] is here to discuss her progress with her obesity treatment plan along with follow-up of her obesity related diagnoses. April [name] is on keeping a food journal and adhering to recommended goals of 1700-1900 calories and 110 protein and states she is following her eating plan approximately 0% of the time. April [name] states she is walking 30 minutes 7 times per week.  Today's visit was #: 21 Starting weight: 263 lbs Starting date: 05/17/2021 Today's weight: 220 lbs Today's date: 09/17/2022 Total lbs lost to date: 43 lbs Total lbs lost since last in-office visit: 1 lb  Interim History:  She reports zero tracking.  Her life has been chaotic the last several weeks. They have a new puppy, 61 month old [place] Shepard, Roscoe  Bioimpedance results: Muscle mass +2 lbs Adipose mass -3.2 lbs.   Subjective:   1. Polyphagia Last Wegovy 0.'25mg'$  dose 07/2022- unable to continue to [employer].  2. Vitamin D deficiency She is on OTC Vitamin D-3, 2,000 IU daily.    3. Prediabetes Lab Results  Component Value Date   HGBA1C 5.5 09/17/2022   HGBA1C 5.3 03/18/2022   HGBA1C 5.4 10/16/2021  Unable to continue [facility] therapy. She is currently on daily Metformin '500mg'$  QD- tolerating well.   Assessment/Plan:   1. Polyphagia Restart GLP-1 therapy in January 2024.  Refill - metFORMIN (GLUCOPHAGE) 500 MG tablet; Take 1 tablet (500 mg total) by mouth daily with breakfast.  Dispense: 30 tablet; Refill: 0  2. Vitamin D deficiency Check labs  - VITAMIN D 25 Hydroxy (Vit-D Deficiency, Fractures)  3. Prediabetes Check labs today.   - Comprehensive metabolic panel - Vitamin N62 - Hemoglobin A1c - Insulin, random  4. Obesity with current BMI of 31.6 30 grams less of protein at major meal.   April [name] is currently in the action stage of change. As such, her goal is to continue with weight loss efforts. She has agreed to practicing portion control and  making smarter food choices, such as increasing vegetables and decreasing simple carbohydrates.   Exercise goals:  As is. Walking Roscoe.  Behavioral modification strategies: increasing lean protein intake, decreasing simple carbohydrates, meal planning and cooking strategies, keeping healthy foods in the home, and planning for success.  April [name] has agreed to follow-up with our clinic in 4 weeks. She was informed of the importance of frequent follow-up visits to maximize her success with intensive lifestyle modifications for her multiple health conditions.   [name] was informed we would discuss her lab results at her next visit unless there is a critical issue that needs to be addressed sooner. April [name] agreed to keep her next visit at the agreed upon time to discuss these results.  Objective:   Blood pressure 114/71, pulse 88, temperature 98 F (36.7 C), height '5\' 10"'$  (1.778 m), weight 220 lb (99.8 kg), SpO2 99 %. Body mass index is 31.57 kg/m.  General: Cooperative, alert, well developed, in no acute distress. HEENT: Conjunctivae and lids unremarkable. Cardiovascular: Regular rhythm.  Lungs: Normal work of breathing. Neurologic: No focal deficits.   Lab Results  Component Value Date   CREATININE 0.72 09/17/2022   BUN 12 09/17/2022   NA 141 09/17/2022   K 4.5 09/17/2022   CL 104 09/17/2022   CO2 23 09/17/2022   Lab Results  Component Value Date   ALT 21 09/17/2022   AST 21 09/17/2022   ALKPHOS 63 09/17/2022   BILITOT 0.3 09/17/2022   Lab Results  Component Value Date   HGBA1C 5.5 09/17/2022   HGBA1C 5.3 03/18/2022   HGBA1C 5.4 10/16/2021   HGBA1C 5.7 (H) 05/17/2021   HGBA1C 5.4 05/26/2020   Lab Results  Component Value Date   INSULIN WILL FOLLOW 09/17/2022   INSULIN 5.7 03/18/2022   INSULIN 9.4 10/16/2021   INSULIN 16.3 05/17/2021   Lab Results  Component Value Date   TSH 0.955 05/17/2021   Lab Results  Component Value Date   CHOL 193 03/18/2022   HDL 62  03/18/2022   LDLCALC 106 (H) 03/18/2022   LDLDIRECT 96.0 05/26/2019   TRIG 142 03/18/2022   CHOLHDL 3.1 03/18/2022   Lab Results  Component Value Date   VD25OH 57.5 09/17/2022   VD25OH 120.0 (H) 03/18/2022   VD25OH 96.3 10/16/2021   Lab Results  Component Value Date   WBC 6.9 05/23/2022   HGB 11.9 (A) 05/23/2022   HCT 36 05/23/2022   MCV 89 10/16/2021   PLT 361 05/23/2022   Lab Results  Component Value Date   IRON 82 05/30/2021   FERRITIN 290.9 05/30/2021    Attestation Statements:   Reviewed by clinician on day of visit: allergies, medications, problem list, medical history, surgical history, family history, social history, and previous encounter notes.  I, Davy Pique, RMA, am acting as Location manager for Mina Marble, NP.  I have reviewed the above documentation for accuracy and completeness, and I agree with the above. -  Nerida Boivin d. Cola Gane, NP-C

## 2022-09-19 ENCOUNTER — Other Ambulatory Visit (HOSPITAL_COMMUNITY): Payer: Self-pay

## 2022-09-19 LAB — COMPREHENSIVE METABOLIC PANEL
ALT: 21 IU/L (ref 0–32)
AST: 21 IU/L (ref 0–40)
Albumin/Globulin Ratio: 1.3 (ref 1.2–2.2)
Albumin: 4.1 g/dL (ref 3.9–4.9)
Alkaline Phosphatase: 63 IU/L (ref 44–121)
BUN/Creatinine Ratio: 17 (ref 9–23)
BUN: 12 mg/dL (ref 6–24)
Bilirubin Total: 0.3 mg/dL (ref 0.0–1.2)
CO2: 23 mmol/L (ref 20–29)
Calcium: 10 mg/dL (ref 8.7–10.2)
Chloride: 104 mmol/L (ref 96–106)
Creatinine, Ser: 0.72 mg/dL (ref 0.57–1.00)
Globulin, Total: 3.2 g/dL (ref 1.5–4.5)
Glucose: 83 mg/dL (ref 70–99)
Potassium: 4.5 mmol/L (ref 3.5–5.2)
Sodium: 141 mmol/L (ref 134–144)
Total Protein: 7.3 g/dL (ref 6.0–8.5)
eGFR: 105 mL/min/{1.73_m2} (ref >59)

## 2022-09-19 LAB — HEMOGLOBIN A1C
Est. average glucose Bld gHb Est-mCnc: 111 mg/dL
Hgb A1c MFr Bld: 5.5 % (ref 4.8–5.6)

## 2022-09-19 LAB — VITAMIN B12: Vitamin B-12: 434 pg/mL (ref 232–1245)

## 2022-09-19 LAB — VITAMIN D 25 HYDROXY (VIT D DEFICIENCY, FRACTURES): Vit D, 25-Hydroxy: 57.5 ng/mL (ref 30.0–100.0)

## 2022-09-19 LAB — INSULIN, RANDOM: INSULIN: 11.8 u[IU]/mL (ref 2.6–24.9)

## 2022-10-02 NOTE — Progress Notes (Unsigned)
[name] [place] Phone: [phone] Subjective:   Rito Ehrlich, am serving as a scribe for Dr. Hulan Saas.  I'm seeing this patient by the request  of:  Janith Lima, MD  CC: Neck and back pain follow-up  [id number]  Trinitey KALESHA IRVING is a 46 y.o. female coming in with complaint of back and neck pain. OMT 08/21/2022. Patient states her for routine manipulation.  Medications patient has been prescribed: None  Taking:         Reviewed prior external information including notes and imaging from previsou exam, outside providers and external EMR if available.   As well as notes that were available from care everywhere and other healthcare systems.  Past medical history, social, surgical and family history all reviewed in electronic medical record.  No pertanent information unless stated regarding to the chief complaint.   Past Medical History:  Diagnosis Date   ALLERGIC RHINITIS    Anal fissure    Anxiety    ANXIETY DEPRESSION    Back pain    CONSTIPATION    Constipation    DEPRESSION    Esophageal reflux    Food allergy    Avacados   Gastritis    GERD (gastroesophageal reflux disease)    HYPERCHOLESTEROLEMIA    Hyperlipidemia    Hypertension    OBESITY    Occipital neuralgia    Palpitations    Rheumatoid arthritis ([place]) 12/24/2017    Allergies  Allergen Reactions   Ciprofloxacin     toxicity   Levaquin [Levofloxacin] Anxiety    Possible CNS side effects. TOXICITY   Moxifloxacin     TOXICITY   Avocado     Other reaction(s): Unknown     Review of Systems:  No headache, visual changes, nausea, vomiting, diarrhea, constipation, dizziness, abdominal pain, skin rash, fevers, chills, night sweats, weight loss, swollen lymph nodes, body aches, joint swelling, chest pain, shortness of breath, mood changes. POSITIVE muscle aches  Objective  Blood pressure 118/70, pulse 73, height  '5\' 10"'$  (1.778 m), weight 230 lb (104.3 kg), SpO2 98 %.   General: No apparent distress alert and oriented x3 mood and affect normal, dressed appropriately.  HEENT: Pupils equal, extraocular movements intact  Respiratory: Patient's speak in full sentences and does not appear short of breath  Cardiovascular: No lower extremity edema, non tender, no erythema  Neck exam does have some loss of lordosis.  Some tenderness to palpation in the paraspinal musculature.  Patient does have some limited sidebending.  Tightness in the parascapular region.  Osteopathic findings  C2 flexed rotated and side bent right C6 flexed rotated and side bent left T3 extended rotated and side bent right inhaled rib T8 extended rotated and side bent left L2 flexed rotated and side bent right Sacrum right on right       Assessment and Plan:  SI (sacroiliac) joint dysfunction Chronic discomfort but does respond extremely well to osteopathic manipulation.  Some more tightness noted in the parascapular area.  Patient is doing very well with everything else.  No significant changes in medication.  We will follow-up again in 6 to 8 weeks    Nonallopathic problems  Decision today to treat with OMT was based on Physical Exam  After verbal consent patient was treated with HVLA, ME, FPR techniques in cervical, rib, thoracic, lumbar, and sacral  areas  Patient tolerated the procedure well with improvement in symptoms  Patient given  exercises, stretches and lifestyle modifications  See medications in patient instructions if given  Patient will follow up in 4-8 weeks     The above documentation has been reviewed and is accurate and complete Lyndal Pulley, DO         Note: This dictation was prepared with Dragon dictation along with smaller phrase technology. Any transcriptional errors that result from this process are unintentional.

## 2022-10-03 ENCOUNTER — Ambulatory Visit: Payer: 59 | Admitting: Family Medicine

## 2022-10-03 VITALS — BP 118/70 | HR 73 | Ht 70.0 in | Wt 230.0 lb

## 2022-10-03 DIAGNOSIS — M9903 Segmental and somatic dysfunction of lumbar region: Secondary | ICD-10-CM | POA: Diagnosis not present

## 2022-10-03 DIAGNOSIS — M9908 Segmental and somatic dysfunction of rib cage: Secondary | ICD-10-CM

## 2022-10-03 DIAGNOSIS — M533 Sacrococcygeal disorders, not elsewhere classified: Secondary | ICD-10-CM

## 2022-10-03 DIAGNOSIS — M9904 Segmental and somatic dysfunction of sacral region: Secondary | ICD-10-CM | POA: Diagnosis not present

## 2022-10-03 DIAGNOSIS — M9902 Segmental and somatic dysfunction of thoracic region: Secondary | ICD-10-CM | POA: Diagnosis not present

## 2022-10-03 DIAGNOSIS — M9901 Segmental and somatic dysfunction of cervical region: Secondary | ICD-10-CM

## 2022-10-03 NOTE — Patient Instructions (Signed)
Good to see you  Glad you are doing so well Good luck with roscoe  Follow up in 6-8 weeks

## 2022-10-03 NOTE — Assessment & Plan Note (Signed)
Chronic discomfort but does respond extremely well to osteopathic manipulation.  Some more tightness noted in the parascapular area.  Patient is doing very well with everything else.  No significant changes in medication.  We will follow-up again in 6 to 8 weeks

## 2022-10-16 ENCOUNTER — Other Ambulatory Visit (HOSPITAL_COMMUNITY): Payer: Self-pay

## 2022-10-17 ENCOUNTER — Other Ambulatory Visit (HOSPITAL_COMMUNITY): Payer: Self-pay

## 2022-10-22 ENCOUNTER — Encounter (INDEPENDENT_AMBULATORY_CARE_PROVIDER_SITE_OTHER): Payer: Self-pay | Admitting: Family Medicine

## 2022-10-22 ENCOUNTER — Ambulatory Visit (INDEPENDENT_AMBULATORY_CARE_PROVIDER_SITE_OTHER): Payer: 59 | Admitting: Family Medicine

## 2022-10-22 ENCOUNTER — Other Ambulatory Visit (HOSPITAL_COMMUNITY): Payer: Self-pay

## 2022-10-22 VITALS — BP 131/77 | HR 73 | Temp 97.9°F | Ht 70.0 in | Wt 226.2 lb

## 2022-10-22 DIAGNOSIS — I10 Essential (primary) hypertension: Secondary | ICD-10-CM | POA: Diagnosis not present

## 2022-10-22 DIAGNOSIS — Z6832 Body mass index (BMI) 32.0-32.9, adult: Secondary | ICD-10-CM

## 2022-10-22 DIAGNOSIS — R7303 Prediabetes: Secondary | ICD-10-CM | POA: Diagnosis not present

## 2022-10-22 DIAGNOSIS — E669 Obesity, unspecified: Secondary | ICD-10-CM | POA: Diagnosis not present

## 2022-10-22 DIAGNOSIS — E559 Vitamin D deficiency, unspecified: Secondary | ICD-10-CM | POA: Diagnosis not present

## 2022-11-05 ENCOUNTER — Encounter: Payer: Self-pay | Admitting: Obstetrics & Gynecology

## 2022-11-05 ENCOUNTER — Ambulatory Visit (INDEPENDENT_AMBULATORY_CARE_PROVIDER_SITE_OTHER): Payer: 59 | Admitting: Obstetrics & Gynecology

## 2022-11-05 ENCOUNTER — Other Ambulatory Visit (HOSPITAL_COMMUNITY): Payer: Self-pay

## 2022-11-05 VITALS — BP 112/78 | HR 84 | Ht 70.25 in | Wt 232.0 lb

## 2022-11-05 DIAGNOSIS — Z3044 Encounter for surveillance of vaginal ring hormonal contraceptive device: Secondary | ICD-10-CM | POA: Diagnosis not present

## 2022-11-05 DIAGNOSIS — E6609 Other obesity due to excess calories: Secondary | ICD-10-CM

## 2022-11-05 DIAGNOSIS — Z01419 Encounter for gynecological examination (general) (routine) without abnormal findings: Secondary | ICD-10-CM

## 2022-11-05 MED ORDER — ETONOGESTREL-ETHINYL ESTRADIOL 0.12-0.015 MG/24HR VA RING
VAGINAL_RING | VAGINAL | 4 refills | Status: DC
  Filled 2022-11-05 – 2023-01-15 (×2): qty 3, 84d supply, fill #0
  Filled 2023-04-02: qty 3, 84d supply, fill #1
  Filled 2023-06-14: qty 3, 84d supply, fill #2
  Filled 2023-09-07: qty 3, 84d supply, fill #3

## 2022-11-05 NOTE — Progress Notes (Signed)
AAMARI WEST November 12, 1976 [id number]   History:    46 y.o. G0 Married. Moved into a new house.   RP:  Established patient presenting for annual gyn exam    HPI: Well on Nuvaring continuous use.  No BTB.  No pelvic pain.  Frequent sebaceous gland cysts/abcesses at vulva/upper inner legs and breast/axilla (Hidradenitis suppurativa) and Psoriasis followed by Dermato.  No pain with IC. Pap Neg 10/2020.  No previous abnormal Pap.  Will repeat Pap at 3 years.  Breasts wnl.  Screening mammo Neg 11/2021.  BMI 33.05.  Exercising regularly.  Health labs with Fam MD.  Anxiety/Depression stable on Lexapro.  Will schedule Colono.   Past medical history,surgical history, family history and social history were all reviewed and documented in the EPIC chart.  Gynecologic History No LMP recorded. (Menstrual status: Other).  Obstetric History OB History  Gravida Para Term Preterm AB Living  0 0 0 0 0 0  SAB IAB Ectopic Multiple Live Births  0 0 0 0 0     ROS: A ROS was performed and pertinent positives and negatives are included in the history. GENERAL: No fevers or chills. HEENT: No change in vision, no earache, sore throat or sinus congestion. NECK: No pain or stiffness. CARDIOVASCULAR: No chest pain or pressure. No palpitations. PULMONARY: No shortness of breath, cough or wheeze. GASTROINTESTINAL: No abdominal pain, nausea, vomiting or diarrhea, melena or bright red blood per rectum. GENITOURINARY: No urinary frequency, urgency, hesitancy or dysuria. MUSCULOSKELETAL: No joint or muscle pain, no back pain, no recent trauma. DERMATOLOGIC: No rash, no itching, no lesions. ENDOCRINE: No polyuria, polydipsia, no heat or cold intolerance. No recent change in weight. HEMATOLOGICAL: No anemia or easy bruising or bleeding. NEUROLOGIC: No headache, seizures, numbness, tingling or weakness. PSYCHIATRIC: No depression, no loss of interest in normal activity or change in sleep pattern.     Exam:   BP 112/78    Pulse 84   Ht 5' 10.25" (1.784 m)   Wt 232 lb (105.2 kg)   SpO2 99%   BMI 33.05 kg/m   Body mass index is 33.05 kg/m.  General appearance : Well developed well nourished female. No acute distress HEENT: Eyes: no retinal hemorrhage or exudates,  Neck supple, trachea midline, no carotid bruits, no thyroidmegaly Lungs: Clear to auscultation, no rhonchi or wheezes, or rib retractions  Heart: Regular rate and rhythm, no murmurs or gallops Breast:Examined in sitting and supine position were symmetrical in appearance, no palpable masses or tenderness,  no skin retraction, no nipple inversion, no nipple discharge, no skin discoloration, no axillary or supraclavicular lymphadenopathy Abdomen: no palpable masses or tenderness, no rebound or guarding Extremities: no edema or skin discoloration or tenderness  Pelvic: Vulva: Normal             Vagina: No gross lesions or discharge  Cervix: No gross lesions or discharge  Uterus  AV, normal size, shape and consistency, non-tender and mobile  Adnexa  Without masses or tenderness  Anus: Normal   Assessment/Plan:  46 y.o. female for annual exam   1. Well female exam with routine gynecological exam Well on Nuvaring continuous use.  No BTB.  No pelvic pain.  Frequent sebaceous gland cysts/abcesses at vulva/upper inner legs and breast/axilla (Hidradenitis suppurativa) and Psoriasis followed by Dermato.  No pain with IC. Pap Neg 10/2020.  No previous abnormal Pap.  Will repeat Pap at 3 years.  Breasts wnl.  Screening mammo Neg 11/2021.  BMI 33.05.  Exercising  regularly.  Health labs with Fam MD.  Anxiety/Depression stable on Lexapro.  Will schedule Colono.  2. Encounter for surveillance of vaginal ring hormonal contraceptive device Well on Nuvaring continuous use.  No BTB.  No pelvic pain. No CI to continue.  Prescription sent to pharmacy.  3. Class 1 obesity due to excess calories without serious comorbidity with body mass index (BMI) of 33.0 to 33.9  in adult Good fitness.  Decrease carbs/calories.  Other orders - etonogestrel-ethinyl estradiol (NUVARING) 0.12-0.015 MG/24HR vaginal ring; REMOVE AND INSERT A NEW RING INTO THE VAGINA EVERY 4 WEEKS *USE CONTINUOUSLY*   Princess Bruins MD, 10:13 AM

## 2022-11-06 ENCOUNTER — Other Ambulatory Visit (HOSPITAL_COMMUNITY): Payer: Self-pay

## 2022-11-06 DIAGNOSIS — I10 Essential (primary) hypertension: Secondary | ICD-10-CM | POA: Insufficient documentation

## 2022-11-06 MED ORDER — METHOTREXATE SODIUM CHEMO INJECTION 50 MG/2ML
25.0000 mg | INTRAMUSCULAR | 0 refills | Status: DC
  Filled 2022-11-06: qty 12, 84d supply, fill #0

## 2022-11-07 ENCOUNTER — Other Ambulatory Visit (HOSPITAL_COMMUNITY): Payer: Self-pay

## 2022-11-10 NOTE — Progress Notes (Signed)
Chief Complaint:   OBESITY April [name] is here to discuss her progress with her obesity treatment plan along with follow-up of her obesity related diagnoses. April [name] is on practicing portion control and making smarter food choices, such as increasing vegetables and decreasing simple carbohydrates and states she is following her eating plan approximately 0% of the time. April [name] states she is walking 30-45 minutes 5-7  times per week.  Today's visit was #: 53 Starting weight: 91 LBS Starting date: 05/17/2021 Today's weight: 226 LBS Today's date: 03/22/2022 Total lbs lost to date: 29 LBS Total lbs lost since last in-office visit: +6 LBS  Interim History: I have not seen patient since January 2023, she is up 2 pounds from that time.  Has been seeing Katie since then.  Work is going well she just has not been following any meal plan, but she thinks that journaling helps.  Patient had labs done at last office visit with Joellen Jersey on 09/17/2022.  She is here to review them.  Discussed CMP, B12, vitamin D, A1c, and fasting insulin results discussed with patient today.  Subjective:   1. Prediabetes Discussed labs with patient today A1c was 5.7 on 05/17/2021, now 5.5.  Fasting insulin is up a little at 11.8 it was 5.7 seven months ago.    2. Vitamin D deficiency Discussed labs with patient today Vitamin D is at goal 57.5.  Patient is taking OTC vitamin D 2000 IUs daily.  3. Essential hypertension Discussed labs with patient today CMP and blood pressure within normal limits.  Patient is taking losartan 50 mg daily.  Patient is asymptomatic with no concerns.  Assessment/Plan:  No orders of the defined types were placed in this encounter.   Medications Discontinued During This Encounter  Medication Reason   metFORMIN (GLUCOPHAGE) 500 MG tablet Completed Course     No orders of the defined types were placed in this encounter.    1. Prediabetes Worsening fasting insulin, A1c prior 5.3 now  5.5.  Patient reminded on how foods affect blood sugar level patient decrease in weight which equals decreased risk diagnosis.  2. Vitamin D deficiency Continue Vitamin D supplement 2000 IU OTC daily.  Winter her counseling done.  3. Essential hypertension Continue medications per PCP.  Continue PNP, weight loss and decrease salt intake.  4. Obesity with current BMI of 32.5 1.  Patient goal: Check everything she eats and bring in log of calories and grams protein for each day. 2.  Second goal walk 5 days a week a 2-minute slow walk.  April [name] is currently in the action stage of change. As such, her goal is to continue with weight loss efforts. She has agreed to keeping a food journal and adhering to recommended goals of 1700-1900 calories and 120+ protein.   Exercise goals:  5 days/week 2 minutes, walk, also, 2 minutes speed walk at 30 minutes each time.  Behavioral modification strategies: holiday eating strategies  and keeping a strict food journal.  April [name] has agreed to follow-up with our clinic in 3-4 weeks. She was informed of the importance of frequent follow-up visits to maximize her success with intensive lifestyle modifications for her multiple health conditions.   Objective:   Blood pressure 131/77, pulse 73, temperature 97.9 F (36.6 C), height '5\' 10"'$  (1.778 m), weight 226 lb 3.2 oz (102.6 kg), SpO2 100 %. Body mass index is 32.46 kg/m.  General: Cooperative, alert, well developed, in no acute distress. HEENT: Conjunctivae and lids unremarkable. Cardiovascular: Regular  rhythm.  Lungs: Normal work of breathing. Neurologic: No focal deficits.   Lab Results  Component Value Date   CREATININE 0.72 09/17/2022   BUN 12 09/17/2022   NA 141 09/17/2022   K 4.5 09/17/2022   CL 104 09/17/2022   CO2 23 09/17/2022   Lab Results  Component Value Date   ALT 21 09/17/2022   AST 21 09/17/2022   ALKPHOS 63 09/17/2022   BILITOT 0.3 09/17/2022   Lab Results  Component Value  Date   HGBA1C 5.5 09/17/2022   HGBA1C 5.3 03/18/2022   HGBA1C 5.4 10/16/2021   HGBA1C 5.7 (H) 05/17/2021   HGBA1C 5.4 05/26/2020   Lab Results  Component Value Date   INSULIN 11.8 09/17/2022   INSULIN 5.7 03/18/2022   INSULIN 9.4 10/16/2021   INSULIN 16.3 05/17/2021   Lab Results  Component Value Date   TSH 0.955 05/17/2021   Lab Results  Component Value Date   CHOL 193 03/18/2022   HDL 62 03/18/2022   LDLCALC 106 (H) 03/18/2022   LDLDIRECT 96.0 05/26/2019   TRIG 142 03/18/2022   CHOLHDL 3.1 03/18/2022   Lab Results  Component Value Date   VD25OH 57.5 09/17/2022   VD25OH 120.0 (H) 03/18/2022   VD25OH 96.3 10/16/2021   Lab Results  Component Value Date   WBC 6.9 05/23/2022   HGB 11.9 (A) 05/23/2022   HCT 36 05/23/2022   MCV 89 10/16/2021   PLT 361 05/23/2022   Lab Results  Component Value Date   IRON 82 05/30/2021   FERRITIN 290.9 05/30/2021   Attestation Statements:   Reviewed by clinician on day of visit: allergies, medications, problem list, medical history, surgical history, family history, social history, and previous encounter notes.  Time spent on visit including pre-visit chart review and post-visit care and charting was 40 minutes.   I, Davy Pique, RMA, am acting as Location manager for [employer], DO.   I have reviewed the above documentation for accuracy and completeness, and I agree with the above. Marjory Sneddon, D.O.  The [place] was signed into law in 2016 which includes the topic of electronic health records.  This provides immediate access to information in MyChart.  This includes consultation notes, operative notes, office notes, lab results and pathology reports.  If you have any questions about what you read please let us know at your next visit so we can discuss your concerns and take corrective action if need be.  We are right here with you.

## 2022-11-13 ENCOUNTER — Other Ambulatory Visit: Payer: Self-pay

## 2022-11-13 DIAGNOSIS — H15011 Anterior scleritis, right eye: Secondary | ICD-10-CM | POA: Diagnosis not present

## 2022-11-13 DIAGNOSIS — H04123 Dry eye syndrome of bilateral lacrimal glands: Secondary | ICD-10-CM | POA: Diagnosis not present

## 2022-11-13 DIAGNOSIS — H47333 Pseudopapilledema of optic disc, bilateral: Secondary | ICD-10-CM | POA: Diagnosis not present

## 2022-11-13 DIAGNOSIS — H1045 Other chronic allergic conjunctivitis: Secondary | ICD-10-CM | POA: Diagnosis not present

## 2022-11-25 ENCOUNTER — Ambulatory Visit (INDEPENDENT_AMBULATORY_CARE_PROVIDER_SITE_OTHER): Payer: Self-pay | Admitting: Family Medicine

## 2022-11-26 ENCOUNTER — Ambulatory Visit: Payer: Self-pay | Admitting: Family Medicine

## 2022-11-27 DIAGNOSIS — M0579 Rheumatoid arthritis with rheumatoid factor of multiple sites without organ or systems involvement: Secondary | ICD-10-CM | POA: Diagnosis not present

## 2022-11-28 ENCOUNTER — Encounter: Payer: Self-pay | Admitting: Internal Medicine

## 2022-11-28 ENCOUNTER — Other Ambulatory Visit (HOSPITAL_COMMUNITY): Payer: Self-pay

## 2022-11-28 ENCOUNTER — Other Ambulatory Visit: Payer: Self-pay | Admitting: Internal Medicine

## 2022-11-28 MED ORDER — FOLIC ACID 1 MG PO TABS
1.0000 mg | ORAL_TABLET | Freq: Every day | ORAL | 3 refills | Status: DC
  Filled 2022-11-28: qty 90, 90d supply, fill #0

## 2022-11-28 MED ORDER — ESOMEPRAZOLE MAGNESIUM 40 MG PO CPDR
40.0000 mg | DELAYED_RELEASE_CAPSULE | Freq: Every day | ORAL | 0 refills | Status: DC
  Filled 2022-11-28: qty 90, 90d supply, fill #0

## 2022-11-28 NOTE — Progress Notes (Signed)
[place]    Subjective:    [name], female    DOB: 1976/03/11, 47 y.o.   MRN: [id number]      HPI April [name] is here for  Chief Complaint  Patient presents with   Infection in finger    Right middle finger infection (has taken Augmentin the last 4 days)    Infected finger -right middle finger is infected.  She has had this in the past.  Typically occurs because the pen wrist on that finger and she developed a callus and she did light on the callus and then she introducing bacteria.  Symptoms started 4 days ago-the area got red and angry.  It then got very hot.  Swelling and redness extended to the PIP joint, but has decompressed and is now only around the cuticle to the DIP joint.  No drainage.  She has been doing warm compresses.  She did take 7 doses of Augmentin that she had leftover from an infection in 2021.  She denies fevers or chills.    Medications and allergies reviewed with patient and updated if appropriate.  Current Outpatient Medications on File Prior to Visit  Medication Sig Dispense Refill   ascorbic acid (VITAMIN C) 1000 MG tablet Take 1,000 mg by mouth daily.     busPIRone (BUSPAR) 15 MG tablet Take 1 tablet (15 mg total) by mouth 2 (two) times daily. (Patient taking differently: Take 15 mg by mouth. Bid as needed) 180 tablet 1   celecoxib (CELEBREX) 200 MG capsule Take 1 capsule (200 mg total) by mouth 2 (two) times daily as needed with food 694 capsule 1   certolizumab pegol (CIMZIA) 2 X 200 MG/ML PSKT prefilled syringe Use 1 injection subcutaneously every other week 2 each 5   cetirizine (ZYRTEC) 10 MG tablet Take 1 tablet (10 mg total) by mouth daily. 30 tablet 3   Cholecalciferol (VITAMIN D) 50 MCG (2000 UT) CAPS Take 1 capsule by mouth daily.     clobetasol ointment (TEMOVATE) 0.05 % as needed.  0   escitalopram (LEXAPRO) 20 MG tablet Take 1 tablet (20 mg total) by mouth daily. 90 tablet 1   esomeprazole (NEXIUM) 40 MG capsule Take 1 capsule  (40 mg total) by mouth daily. 90 capsule 0   etonogestrel-ethinyl estradiol (NUVARING) 0.12-0.015 MG/24HR vaginal ring REMOVE AND INSERT A NEW RING INTO THE VAGINA EVERY 4 WEEKS *USE CONTINUOUSLY* 3 each 4   famotidine (PEPCID) 20 MG tablet Take 20 mg by mouth at bedtime. Take 1 tablets by mouth daily at bedtime     fenofibrate 160 MG tablet Take 1 tablet (160 mg total) by mouth daily. 90 tablet 1   folic acid (FOLVITE) 1 MG tablet Take 1 tablet (1 mg total) by mouth daily. 90 tablet 3   ibuprofen (ADVIL) 800 MG tablet Take 1 tablet (800 mg total) by mouth every 8 (eight) hours as needed. 90 tablet 0   losartan (COZAAR) 50 MG tablet Take 1 tablet (50 mg total) by mouth daily. 90 tablet 1   MAGNESIUM MALATE PO Take 1 tablet by mouth at bedtime.     methotrexate 50 MG/2ML injection Inject 1 mL (25 mg total) into the skin once a week. 12 mL 0   montelukast (SINGULAIR) 10 MG tablet Take 1 tablet (10 mg total) by mouth at bedtime. 90 tablet 1   Multiple Vitamin (MULTIVITAMIN) tablet Take 1 tablet by mouth daily.     Omega-3 Fatty Acids (FISH OIL) 1000 MG  CAPS Take 2 capsules by mouth 2 (two) times daily.      zolpidem (AMBIEN) 5 MG tablet Take 1 tablet (5 mg total) by mouth at bedtime as needed for sleep. 90 tablet 1   No current facility-administered medications on file prior to visit.    Review of Systems     Objective:   Vitals:   11/29/22 0856  BP: 120/72  Pulse: 87  Temp: 98.6 F (37 C)  SpO2: 99%   BP Readings from Last 3 Encounters:  11/29/22 120/72  11/05/22 112/78  10/22/22 131/77   Wt Readings from Last 3 Encounters:  11/29/22 238 lb (108 kg)  11/05/22 232 lb (105.2 kg)  10/22/22 226 lb 3.2 oz (102.6 kg)   Body mass index is 33.67 kg/m.    Physical Exam Constitutional:      General: She is not in acute distress.    Appearance: Normal appearance. She is not ill-appearing.  HENT:     Head: Normocephalic and atraumatic.  Musculoskeletal:     Comments: Right middle  finger lateral aspect of nail and along cuticle down to DIP joint there is swelling, erythema, tenderness and warmth.  On the lateral edge of the cuticle there is an area of white discoloration with likely pus underlying.  Sensation intact.  Normal range of motion.  Skin:    General: Skin is warm and dry.  Neurological:     Mental Status: She is alert.       Procedure:  I & D Indications: Abscess-paronychia right middle finger   Procedure Details Consent:  Verbal consent for procedure obtained. Performed.  The area was cleaned with alcohol swabs.     A small area of the abscess was injected with < 1 cc lidocaine without epi with a 25 1/2 " needle.  A # 11 scalpel was used to make a couple of small incisions and with pressure a moderate amount of pus was extracted.  There was minimal blood loss.      Antibacterial ointment and sterile dressing was applied.  Patient did tolerate procedure well.      Assessment & Plan:    Paronychia, right middle finger: Acute Started 4 days ago She did take 7 doses of Augmentin, but states there is no improvement-the antibiotic was expired from 2021 She has been soaking it Tetanus up-to-date Incision and drainage was done and a moderate amount of pus was removed with some relief of the pressure Continue warm soaks Stop Augmentin Start Bactrim DS twice daily x 7 days She will call with any concerns

## 2022-11-29 ENCOUNTER — Other Ambulatory Visit (HOSPITAL_COMMUNITY): Payer: Self-pay

## 2022-11-29 ENCOUNTER — Ambulatory Visit: Payer: Commercial Managed Care - PPO | Admitting: Internal Medicine

## 2022-11-29 ENCOUNTER — Encounter: Payer: Self-pay | Admitting: Pharmacist

## 2022-11-29 ENCOUNTER — Other Ambulatory Visit: Payer: Self-pay

## 2022-11-29 VITALS — BP 120/72 | HR 87 | Temp 98.6°F | Ht 70.5 in | Wt 238.0 lb

## 2022-11-29 DIAGNOSIS — L03011 Cellulitis of right finger: Secondary | ICD-10-CM

## 2022-11-29 MED ORDER — SULFAMETHOXAZOLE-TRIMETHOPRIM 800-160 MG PO TABS
1.0000 | ORAL_TABLET | Freq: Two times a day (BID) | ORAL | 0 refills | Status: AC
  Filled 2022-11-29 (×2): qty 14, 7d supply, fill #0

## 2022-11-29 NOTE — Patient Instructions (Addendum)
        Medications changes include :   Bactrim DS bid x 1 week      Return if symptoms worsen or fail to improve.

## 2022-12-09 ENCOUNTER — Other Ambulatory Visit (HOSPITAL_COMMUNITY): Payer: Self-pay

## 2022-12-09 DIAGNOSIS — Z1231 Encounter for screening mammogram for malignant neoplasm of breast: Secondary | ICD-10-CM | POA: Diagnosis not present

## 2022-12-11 ENCOUNTER — Encounter: Payer: Self-pay | Admitting: Obstetrics & Gynecology

## 2022-12-11 ENCOUNTER — Other Ambulatory Visit (HOSPITAL_COMMUNITY): Payer: Self-pay

## 2022-12-16 ENCOUNTER — Ambulatory Visit (INDEPENDENT_AMBULATORY_CARE_PROVIDER_SITE_OTHER): Payer: Self-pay | Admitting: Family Medicine

## 2022-12-24 NOTE — Progress Notes (Unsigned)
April [name] [place] [address] [place] Phone: [phone] Subjective:   [name], am serving as a scribe for Dr. Hulan Saas.  I'm seeing this patient by the request  of:  Janith Lima, MD  CC: Neck and back pain follow-up  VEL:[id number]  April [name] is a 47 y.o. female coming in with complaint of back and neck pain. OMT on 10/03/2022. Patient states doing well. Elbow pain R side, has been playing tennis with her dog.   Medications patient has been prescribed:   Taking:         Reviewed prior external information including notes and imaging from previsou exam, outside providers and external EMR if available.   As well as notes that were available from care everywhere and other healthcare systems.  Past medical history, social, surgical and family history all reviewed in electronic medical record.  No pertanent information unless stated regarding to the chief complaint.   Past Medical History:  Diagnosis Date   ALLERGIC RHINITIS    Anal fissure    Anxiety    ANXIETY DEPRESSION    Back pain    CONSTIPATION    Constipation    DEPRESSION    Esophageal reflux    Food allergy    Avacados   Gastritis    GERD (gastroesophageal reflux disease)    HYPERCHOLESTEROLEMIA    Hyperlipidemia    Hypertension    OBESITY    Occipital neuralgia    Palpitations    Rheumatoid arthritis ([place]) 12/24/2017    Allergies  Allergen Reactions   Ciprofloxacin     toxicity   Levaquin [Levofloxacin] Anxiety    Possible CNS side effects. TOXICITY   Moxifloxacin     TOXICITY   Avocado     Other reaction(s): Unknown     Review of Systems:  No headache, visual changes, nausea, vomiting, diarrhea, constipation, dizziness, abdominal pain, skin rash, fevers, chills, night sweats, weight loss, swollen lymph nodes, body aches, joint swelling, chest pain, shortness of breath, mood changes. POSITIVE muscle aches  Objective   Blood pressure 116/68, pulse 86, height '5\' 10"'$  (1.778 m), weight 242 lb (109.8 kg), SpO2 98 %.   General: No apparent distress alert and oriented x3 mood and affect normal, dressed appropriately.  HEENT: Pupils equal, extraocular movements intact  Respiratory: Patient's speak in full sentences and does not appear short of breath  Cardiovascular: No lower extremity edema, non tender, no erythema  Right elbow does have tenderness over the lateral epicondylar region.  Worsening pain with resisted wrist extension Low back exam does have some loss of lordosis.  Some tightness noted with FABER test.  Patient does have more discomfort in the thoracolumbar junction noted.  Osteopathic findings  C2 flexed rotated and side bent right C7 flexed rotated and side bent left T3 extended rotated and side bent right inhaled rib T10 extended rotated and side bent left L1 flexed rotated and side bent right L3 flexed rotated and side bent left Sacrum right on right       Assessment and Plan:  SI (sacroiliac) joint dysfunction Patient does have the sacroiliac joint dysfunction still noted.  Some of it is secondary to more the rheumatoid arthritis as well.  We discussed icing regimen and home exercises, which activities to do and which ones to avoid.  Increasing activity slowly.  Follow-up again in 6 to 8 weeks  Right lateral epicondylitis Elbow anatomy was reviewed, and tendinopathy was explained.  Pt. given a home rehab program. Start with isometrics and ROM, then a series of concentric and eccentric exercises should be done starting with no weight, work up to 1 lb, hammer, etc.  Use counterforce strap if working or using hands.  Formal PT would be beneficial. Emphasized stretching an cross-friction massage Emphasized proper palms up lifting biomechanics to unload ECRB Discussed thicker grip on tennis racquet    Nonallopathic problems  Decision today to treat with OMT was based on Physical  Exam  After verbal consent patient was treated with HVLA, ME, FPR techniques in cervical, rib, thoracic, lumbar, and sacral  areas  Patient tolerated the procedure well with improvement in symptoms  Patient given exercises, stretches and lifestyle modifications  See medications in patient instructions if given  Patient will follow up in 4-8 weeks    The above documentation has been reviewed and is accurate and complete Lyndal Pulley, DO          Note: This dictation was prepared with Dragon dictation along with smaller phrase technology. Any transcriptional errors that result from this process are unintentional.

## 2022-12-25 ENCOUNTER — Ambulatory Visit: Payer: Commercial Managed Care - PPO | Admitting: Family Medicine

## 2022-12-25 VITALS — BP 116/68 | HR 86 | Ht 70.0 in | Wt 242.0 lb

## 2022-12-25 DIAGNOSIS — M533 Sacrococcygeal disorders, not elsewhere classified: Secondary | ICD-10-CM | POA: Diagnosis not present

## 2022-12-25 DIAGNOSIS — M9901 Segmental and somatic dysfunction of cervical region: Secondary | ICD-10-CM

## 2022-12-25 DIAGNOSIS — M9908 Segmental and somatic dysfunction of rib cage: Secondary | ICD-10-CM

## 2022-12-25 DIAGNOSIS — M7711 Lateral epicondylitis, right elbow: Secondary | ICD-10-CM

## 2022-12-25 DIAGNOSIS — M9903 Segmental and somatic dysfunction of lumbar region: Secondary | ICD-10-CM | POA: Diagnosis not present

## 2022-12-25 DIAGNOSIS — M9902 Segmental and somatic dysfunction of thoracic region: Secondary | ICD-10-CM

## 2022-12-25 DIAGNOSIS — M9904 Segmental and somatic dysfunction of sacral region: Secondary | ICD-10-CM

## 2022-12-25 NOTE — Assessment & Plan Note (Signed)
Patient does have the sacroiliac joint dysfunction still noted.  Some of it is secondary to more the rheumatoid arthritis as well.  We discussed icing regimen and home exercises, which activities to do and which ones to avoid.  Increasing activity slowly.  Follow-up again in 6 to 8 weeks

## 2022-12-25 NOTE — Patient Instructions (Signed)
Do prescribed exercises at least 3x a week  Brace for 2 weeks then nightly for 2 week See you again in 6-8 weeks

## 2022-12-25 NOTE — Assessment & Plan Note (Signed)
Elbow anatomy was reviewed, and tendinopathy was explained.  Pt. given a home rehab program. Start with isometrics and ROM, then a series of concentric and eccentric exercises should be done starting with no weight, work up to 1 lb, hammer, etc.  Use counterforce strap if working or using hands.  Formal PT would be beneficial. Emphasized stretching an cross-friction massage Emphasized proper palms up lifting biomechanics to unload ECRB Discussed thicker grip on tennis racquet

## 2022-12-29 ENCOUNTER — Other Ambulatory Visit: Payer: Self-pay | Admitting: Internal Medicine

## 2022-12-29 MED ORDER — FENOFIBRATE 160 MG PO TABS
160.0000 mg | ORAL_TABLET | Freq: Every day | ORAL | 1 refills | Status: DC
  Filled 2022-12-29: qty 90, 90d supply, fill #0
  Filled 2023-03-28: qty 90, 90d supply, fill #1

## 2022-12-30 ENCOUNTER — Other Ambulatory Visit: Payer: Self-pay

## 2022-12-30 ENCOUNTER — Other Ambulatory Visit (HOSPITAL_COMMUNITY): Payer: Self-pay

## 2023-01-02 ENCOUNTER — Other Ambulatory Visit: Payer: Self-pay

## 2023-01-02 ENCOUNTER — Other Ambulatory Visit (HOSPITAL_COMMUNITY): Payer: Self-pay

## 2023-01-08 ENCOUNTER — Other Ambulatory Visit (HOSPITAL_COMMUNITY): Payer: Self-pay

## 2023-01-09 ENCOUNTER — Other Ambulatory Visit (HOSPITAL_COMMUNITY): Payer: Self-pay

## 2023-01-15 ENCOUNTER — Other Ambulatory Visit (HOSPITAL_COMMUNITY): Payer: Self-pay

## 2023-01-16 ENCOUNTER — Encounter: Payer: Self-pay | Admitting: Internal Medicine

## 2023-01-16 ENCOUNTER — Other Ambulatory Visit: Payer: Self-pay | Admitting: Internal Medicine

## 2023-01-16 DIAGNOSIS — Z1211 Encounter for screening for malignant neoplasm of colon: Secondary | ICD-10-CM | POA: Insufficient documentation

## 2023-01-16 DIAGNOSIS — R197 Diarrhea, unspecified: Secondary | ICD-10-CM | POA: Insufficient documentation

## 2023-01-16 DIAGNOSIS — K219 Gastro-esophageal reflux disease without esophagitis: Secondary | ICD-10-CM | POA: Insufficient documentation

## 2023-01-17 ENCOUNTER — Encounter: Payer: Self-pay | Admitting: Gastroenterology

## 2023-01-23 ENCOUNTER — Other Ambulatory Visit (HOSPITAL_COMMUNITY): Payer: Self-pay

## 2023-01-28 ENCOUNTER — Other Ambulatory Visit (HOSPITAL_COMMUNITY): Payer: Self-pay

## 2023-01-29 ENCOUNTER — Other Ambulatory Visit (HOSPITAL_COMMUNITY): Payer: Self-pay

## 2023-01-31 ENCOUNTER — Other Ambulatory Visit: Payer: Self-pay

## 2023-02-03 ENCOUNTER — Other Ambulatory Visit (HOSPITAL_COMMUNITY): Payer: Self-pay

## 2023-02-03 ENCOUNTER — Other Ambulatory Visit: Payer: Self-pay

## 2023-02-05 ENCOUNTER — Telehealth: Payer: Self-pay

## 2023-02-05 NOTE — Telephone Encounter (Signed)
April Bruins, MD  You25 minutes ago (10:20 AM)    Agree, but it was under her name. Dr Dellis Filbert

## 2023-02-05 NOTE — Telephone Encounter (Signed)
April Bruins, MD  [name], RMA This comes from [employer] we receive.  Per the letter, she has vascular disease d/t DM and therefore an increased risk of stroke/DVT/PE on Estrogens.  If it is not the case, she can continue on it. Dr Carlean Jews       Previous Messages    ----- Message ----- From: [name], RMA Sent: 02/04/2023   3:33 PM EDT To: April Bruins, MD Subject: RE: Risks associated with Estrogen containin*  What risks are increased?  DVT? Also, her Chart Snapshot indicates Prediabetes and her HgbA1 and Insulin were normal 08/2022.  Please know I am not questioning what you wrote me just trying to clarify and understand before I call her with this. ----- Message ----- From: April Bruins, MD Sent: 02/03/2023   2:54 PM EDT To: [facility] Triage Subject: Risks associated with Estrogen containing co*  Please inform the patient that given her complicated DM, her risks are increased on the [place] or any contraceptive containing Estrogen.  Please offer her alternative contraceptive options without Estrogen.  Can set a counseling office visit with me. Dr Dellis Filbert

## 2023-02-05 NOTE — Telephone Encounter (Signed)
I spoke with patient and she has no diabetes. She said she was diagnosed prediabetes years ago. No history of vascular disease. She questioned if we even had the right patient. I did tell her it was a letter from pharmacy Dr. Marguerita Merles received regarding drug interactions and side affects.  She will continue using her Nuvaring since none of this was applicable.

## 2023-02-06 ENCOUNTER — Other Ambulatory Visit (HOSPITAL_COMMUNITY): Payer: Self-pay

## 2023-02-10 ENCOUNTER — Other Ambulatory Visit (HOSPITAL_COMMUNITY): Payer: Self-pay

## 2023-02-11 ENCOUNTER — Ambulatory Visit: Payer: Commercial Managed Care - PPO | Admitting: Internal Medicine

## 2023-02-11 ENCOUNTER — Encounter: Payer: Self-pay | Admitting: Internal Medicine

## 2023-02-11 ENCOUNTER — Other Ambulatory Visit (HOSPITAL_COMMUNITY): Payer: Self-pay

## 2023-02-11 VITALS — BP 120/70 | HR 96 | Temp 98.0°F | Ht 70.0 in | Wt 247.0 lb

## 2023-02-11 DIAGNOSIS — L02416 Cutaneous abscess of left lower limb: Secondary | ICD-10-CM | POA: Diagnosis not present

## 2023-02-11 MED ORDER — SULFAMETHOXAZOLE-TRIMETHOPRIM 800-160 MG PO TABS
1.0000 | ORAL_TABLET | Freq: Two times a day (BID) | ORAL | 0 refills | Status: AC
  Filled 2023-02-11: qty 20, 10d supply, fill #0

## 2023-02-11 NOTE — Patient Instructions (Addendum)
       Medications changes include :   Bactrim DS bid x 10 days     Return if symptoms worsen or fail to improve.

## 2023-02-11 NOTE — Progress Notes (Signed)
Subjective:    [name], female    DOB: 1976/02/04, 47 y.o.   MRN: [id number]      HPI Kariyah is here for  Chief Complaint  Patient presents with   Hip abcess    Right side     Noticed it 4-5 weeks - started as boil and has gotten larger.  It is painful.  Redness spreading.  Has done epsom salt baths, .  No fevers/chills.  H/o boils in other places.  1 was MSSA and 1 was MRSA    Medications and allergies reviewed with patient and updated if appropriate.  Current Outpatient Medications on File Prior to Visit  Medication Sig Dispense Refill   ascorbic acid (VITAMIN C) 1000 MG tablet Take 1,000 mg by mouth daily.     busPIRone (BUSPAR) 15 MG tablet Take 1 tablet (15 mg total) by mouth 2 (two) times daily. (Patient taking differently: Take 15 mg by mouth. Bid as needed) 180 tablet 1   celecoxib (CELEBREX) 200 MG capsule Take 1 capsule (200 mg total) by mouth 2 (two) times daily as needed with food [id number] capsule 1   certolizumab pegol (CIMZIA) 2 X 200 MG/ML PSKT prefilled syringe Use 1 injection subcutaneously every other week 2 each 5   cetirizine (ZYRTEC) 10 MG tablet Take 1 tablet (10 mg total) by mouth daily. 30 tablet 3   Cholecalciferol (VITAMIN D) 50 MCG (2000 UT) CAPS Take 1 capsule by mouth daily.     clobetasol ointment (TEMOVATE) 0.05 % as needed.  0   escitalopram (LEXAPRO) 20 MG tablet Take 1 tablet (20 mg total) by mouth daily. 90 tablet 1   esomeprazole (NEXIUM) 40 MG capsule Take 1 capsule (40 mg total) by mouth daily. 90 capsule 0   etonogestrel-ethinyl estradiol (NUVARING) 0.12-0.015 MG/24HR vaginal ring REMOVE AND INSERT A NEW RING INTO THE VAGINA EVERY 4 WEEKS *USE CONTINUOUSLY* 3 each 4   famotidine (PEPCID) 20 MG tablet Take 20 mg by mouth at bedtime. Take 1 tablets by mouth daily at bedtime     fenofibrate 160 MG tablet Take 1 tablet (160 mg total) by mouth daily. 90 tablet 1   folic acid (FOLVITE) 1 MG tablet Take 1 tablet (1 mg  total) by mouth daily. 90 tablet 3   ibuprofen (ADVIL) 800 MG tablet Take 1 tablet (800 mg total) by mouth every 8 (eight) hours as needed. 90 tablet 0   losartan (COZAAR) 50 MG tablet Take 1 tablet (50 mg total) by mouth daily. 90 tablet 1   MAGNESIUM MALATE PO Take 1 tablet by mouth at bedtime.     methotrexate 50 MG/2ML injection Inject 1 mL (25 mg total) into the skin once a week. 12 mL 0   montelukast (SINGULAIR) 10 MG tablet Take 1 tablet (10 mg total) by mouth at bedtime. 90 tablet 1   Multiple Vitamin (MULTIVITAMIN) tablet Take 1 tablet by mouth daily.     Omega-3 Fatty Acids (FISH OIL) 1000 MG CAPS Take 2 capsules by mouth 2 (two) times daily.      zolpidem (AMBIEN) 5 MG tablet Take 1 tablet (5 mg total) by mouth at bedtime as needed for sleep. 90 tablet 1   No current facility-administered medications on file prior to visit.    Review of Systems     Objective:   Vitals:   02/11/23 1103  BP: 120/70  Pulse: 96  Temp: 98 F (36.7 C)  SpO2: 99%  BP Readings from Last 3 Encounters:  02/11/23 120/70  12/25/22 116/68  11/29/22 120/72   Wt Readings from Last 3 Encounters:  02/11/23 247 lb (112 kg)  12/25/22 242 lb (109.8 kg)  11/29/22 238 lb (108 kg)   Body mass index is 35.44 kg/m.    Physical Exam Constitutional:      General: She is not in acute distress.    Appearance: Normal appearance. She is not ill-appearing.  HENT:     Head: Normocephalic and atraumatic.  Skin:    General: Skin is warm and dry.     Comments: Right lateral hip with area of dark, warm erythema approximately 5 cm in diameter.  There is a central area of minimal fluctuance with surrounding induration.  No open wound, active drainage.  Neurological:     Mental Status: She is alert.       Procedure:  I & D Indications: Abscess of right lateral hip   Procedure Details Consent:  Verbal consent for procedure obtained. Performed.  The area was cleaned with iodine and alcohol swabs.     A  small area of the abscess was injected with < 1 cc lidocaine with epi with a 25 1/2 " needle.  A #11 scalpel was used to make a small incision approximately 4 mm of depth into the dermal labor and with pressure a small amount of pus was extracted.  There was minimal bleeding.      Antibacterial ointment applied, sterile dressing applied.  Patient did tolerate procedure well.      Assessment & Plan:    Skin abscess right lower extremity-lateral hip: Acute First noticed a cyst or boil approximately 4-5 weeks ago She does have a history of cysts Gotten larger and more painful in the past few weeks She did request to have it lanced to help with healing-see procedure note Small amount of pus was extracted Start Bactrim DS 800-160 mg twice daily x 10 days Warm compresses She will monitor the area closely and call with any concerns

## 2023-02-12 ENCOUNTER — Other Ambulatory Visit (HOSPITAL_COMMUNITY): Payer: Self-pay

## 2023-02-13 ENCOUNTER — Other Ambulatory Visit (HOSPITAL_COMMUNITY): Payer: Self-pay

## 2023-02-13 NOTE — Progress Notes (Signed)
April [name] [address] [place] Phone: [phone] Subjective:   April [name], am serving as a scribe for Dr. Hulan Saas.  I'm seeing this patient by the request  of:  April Lima, MD  CC: back and neck pain   [id number]  April [name] is a 47 y.o. female coming in with complaint of back and neck pain. OMT on 12/25/2022. Patient states here for manipulation. Elbow is still giving her trouble. Brought the brace that she wears. No new concerns.  Medications patient has been prescribed:   Taking:         Reviewed prior external information including notes and imaging from previsou exam, outside providers and external EMR if available.   As well as notes that were available from care everywhere and other healthcare systems.  Past medical history, social, surgical and family history all reviewed in electronic medical record.  No pertanent information unless stated regarding to the chief complaint.   Past Medical History:  Diagnosis Date   ALLERGIC RHINITIS    Anal fissure    Anxiety    ANXIETY DEPRESSION    Back pain    CONSTIPATION    Constipation    DEPRESSION    Esophageal reflux    Food allergy    Avacados   Gastritis    GERD (gastroesophageal reflux disease)    HYPERCHOLESTEROLEMIA    Hyperlipidemia    Hypertension    OBESITY    Occipital neuralgia    Palpitations    Rheumatoid arthritis ([place]) 12/24/2017    Allergies  Allergen Reactions   Ciprofloxacin     toxicity   Levaquin [Levofloxacin] Anxiety    Possible CNS side effects. TOXICITY   Moxifloxacin     TOXICITY   Avocado     Other reaction(s): Unknown     Review of Systems:  No headache, visual changes, nausea, vomiting, diarrhea, constipation, dizziness, abdominal pain, skin rash, fevers, chills, night sweats, weight loss, swollen lymph nodes, body aches, joint swelling, chest pain, shortness of breath, mood changes. POSITIVE  muscle aches  Objective  There were no vitals taken for this visit.   General: No apparent distress alert and oriented x3 mood and affect normal, dressed appropriately.  HEENT: Pupils equal, extraocular movements intact  Respiratory: Patient's speak in full sentences and does not appear short of breath  Cardiovascular: No lower extremity edema, non tender, no erythema  Neck pain still has some tenderness to palpation noted.  Patient's low back does have significant tightness noted and seems to be more in the thoracolumbar in the lumbosacral areas.  Seems to be diffusely in the paraspinal musculature.  No midline tenderness  Still with pain with resisted extension of the wrist on the right side.  Osteopathic findings  C2 flexed rotated and side bent right C4 flexed rotated and side bent left C6 flexed rotated and side bent left T3 extended rotated and side bent right inhaled rib T7 extended rotated and side bent right T9 extended rotated and side bent left L1 flexed rotated and side bent right L3 flexed rotated and side bent left Sacrum right on right       Assessment and Plan:  No problem-specific Assessment & Plan notes found for this encounter.    Nonallopathic problems  Decision today to treat with OMT was based on Physical Exam  After verbal consent patient was treated with HVLA, ME, FPR techniques in cervical, rib, thoracic, lumbar, and sacral  areas  Patient tolerated the procedure well with improvement in symptoms  Patient given exercises, stretches and lifestyle modifications  See medications in patient instructions if given  Patient will follow up in 4-8 weeks      The above documentation has been reviewed and is accurate and complete April Pulley, DO        Note: This dictation was prepared with Dragon dictation along with smaller phrase technology. Any transcriptional errors that result from this process are unintentional.

## 2023-02-14 ENCOUNTER — Other Ambulatory Visit: Payer: Self-pay

## 2023-02-14 ENCOUNTER — Other Ambulatory Visit (HOSPITAL_COMMUNITY): Payer: Self-pay

## 2023-02-17 ENCOUNTER — Other Ambulatory Visit (HOSPITAL_COMMUNITY): Payer: Self-pay

## 2023-02-18 ENCOUNTER — Other Ambulatory Visit (HOSPITAL_COMMUNITY): Payer: Self-pay

## 2023-02-18 ENCOUNTER — Ambulatory Visit: Payer: Commercial Managed Care - PPO | Admitting: Family Medicine

## 2023-02-19 ENCOUNTER — Other Ambulatory Visit (HOSPITAL_COMMUNITY): Payer: Self-pay

## 2023-02-20 ENCOUNTER — Encounter: Payer: Self-pay | Admitting: Family Medicine

## 2023-02-20 ENCOUNTER — Other Ambulatory Visit (HOSPITAL_COMMUNITY): Payer: Self-pay

## 2023-02-20 ENCOUNTER — Ambulatory Visit: Payer: Commercial Managed Care - PPO | Admitting: Family Medicine

## 2023-02-20 VITALS — BP 136/76 | HR 92 | Ht 70.0 in | Wt 247.0 lb

## 2023-02-20 DIAGNOSIS — M9904 Segmental and somatic dysfunction of sacral region: Secondary | ICD-10-CM

## 2023-02-20 DIAGNOSIS — M9903 Segmental and somatic dysfunction of lumbar region: Secondary | ICD-10-CM | POA: Diagnosis not present

## 2023-02-20 DIAGNOSIS — M9901 Segmental and somatic dysfunction of cervical region: Secondary | ICD-10-CM | POA: Diagnosis not present

## 2023-02-20 DIAGNOSIS — M9908 Segmental and somatic dysfunction of rib cage: Secondary | ICD-10-CM

## 2023-02-20 DIAGNOSIS — M533 Sacrococcygeal disorders, not elsewhere classified: Secondary | ICD-10-CM

## 2023-02-20 DIAGNOSIS — M7711 Lateral epicondylitis, right elbow: Secondary | ICD-10-CM | POA: Diagnosis not present

## 2023-02-20 DIAGNOSIS — M9902 Segmental and somatic dysfunction of thoracic region: Secondary | ICD-10-CM | POA: Diagnosis not present

## 2023-02-20 NOTE — Patient Instructions (Signed)
See you again in 6 weeks Don't let husband have all the fun with the dog Keep wearing brace Underhand picking things up Monitor at eye level

## 2023-02-20 NOTE — Assessment & Plan Note (Signed)
Continues to have some difficulty.  Discussed potential injection or PRP. Discussed with patient and given her a different brace that I think will be more beneficial.  Increase activity slowly.  Follow-up again in 6 to 8 weeks

## 2023-02-20 NOTE — Assessment & Plan Note (Signed)
Continues to have some discomfort, do not think any underlying autoimmune diseases causing a flare at the moment.  Discussed icing regimen and home exercises.  Patient will continue to work on core strengthening.

## 2023-02-21 ENCOUNTER — Other Ambulatory Visit (HOSPITAL_COMMUNITY): Payer: Self-pay

## 2023-02-24 ENCOUNTER — Other Ambulatory Visit: Payer: Self-pay

## 2023-02-26 ENCOUNTER — Other Ambulatory Visit (HOSPITAL_COMMUNITY): Payer: Self-pay

## 2023-02-26 DIAGNOSIS — L732 Hidradenitis suppurativa: Secondary | ICD-10-CM | POA: Diagnosis not present

## 2023-02-26 DIAGNOSIS — Z79899 Other long term (current) drug therapy: Secondary | ICD-10-CM | POA: Diagnosis not present

## 2023-02-26 DIAGNOSIS — E669 Obesity, unspecified: Secondary | ICD-10-CM | POA: Diagnosis not present

## 2023-02-26 DIAGNOSIS — L409 Psoriasis, unspecified: Secondary | ICD-10-CM | POA: Diagnosis not present

## 2023-02-26 DIAGNOSIS — M0579 Rheumatoid arthritis with rheumatoid factor of multiple sites without organ or systems involvement: Secondary | ICD-10-CM | POA: Diagnosis not present

## 2023-02-26 DIAGNOSIS — Z6837 Body mass index (BMI) 37.0-37.9, adult: Secondary | ICD-10-CM | POA: Diagnosis not present

## 2023-02-26 DIAGNOSIS — R5383 Other fatigue: Secondary | ICD-10-CM | POA: Diagnosis not present

## 2023-02-26 DIAGNOSIS — H15101 Unspecified episcleritis, right eye: Secondary | ICD-10-CM | POA: Diagnosis not present

## 2023-03-03 ENCOUNTER — Other Ambulatory Visit (HOSPITAL_COMMUNITY): Payer: Self-pay

## 2023-03-03 ENCOUNTER — Other Ambulatory Visit: Payer: Self-pay

## 2023-03-03 MED ORDER — CIMZIA (2 SYRINGE) 200 MG/ML ~~LOC~~ PSKT
PREFILLED_SYRINGE | SUBCUTANEOUS | 4 refills | Status: DC
  Filled 2023-03-12: qty 1, 28d supply, fill #0
  Filled 2023-05-20: qty 1, 28d supply, fill #1
  Filled 2023-06-18: qty 1, 28d supply, fill #2

## 2023-03-04 ENCOUNTER — Other Ambulatory Visit (HOSPITAL_COMMUNITY): Payer: Self-pay

## 2023-03-04 ENCOUNTER — Other Ambulatory Visit: Payer: Self-pay | Admitting: Internal Medicine

## 2023-03-04 DIAGNOSIS — I10 Essential (primary) hypertension: Secondary | ICD-10-CM

## 2023-03-04 MED ORDER — METHOTREXATE SODIUM CHEMO INJECTION 50 MG/2ML
25.0000 mg | INTRAMUSCULAR | 0 refills | Status: DC
  Filled 2023-03-04: qty 12, 84d supply, fill #0

## 2023-03-04 MED ORDER — LOSARTAN POTASSIUM 50 MG PO TABS
50.0000 mg | ORAL_TABLET | Freq: Every day | ORAL | 0 refills | Status: DC
  Filled 2023-03-04: qty 90, 90d supply, fill #0

## 2023-03-04 MED ORDER — ESOMEPRAZOLE MAGNESIUM 40 MG PO CPDR
40.0000 mg | DELAYED_RELEASE_CAPSULE | Freq: Every day | ORAL | 0 refills | Status: DC
  Filled 2023-03-04: qty 90, 90d supply, fill #0

## 2023-03-04 MED ORDER — ESCITALOPRAM OXALATE 20 MG PO TABS
20.0000 mg | ORAL_TABLET | Freq: Every day | ORAL | 0 refills | Status: DC
  Filled 2023-03-04: qty 90, 90d supply, fill #0

## 2023-03-10 ENCOUNTER — Ambulatory Visit: Payer: Commercial Managed Care - PPO | Admitting: Gastroenterology

## 2023-03-10 ENCOUNTER — Encounter: Payer: Self-pay | Admitting: Gastroenterology

## 2023-03-10 ENCOUNTER — Other Ambulatory Visit (HOSPITAL_COMMUNITY): Payer: Self-pay

## 2023-03-10 VITALS — BP 130/80 | HR 89 | Ht 70.0 in | Wt 250.0 lb

## 2023-03-10 DIAGNOSIS — Z1211 Encounter for screening for malignant neoplasm of colon: Secondary | ICD-10-CM

## 2023-03-10 DIAGNOSIS — K219 Gastro-esophageal reflux disease without esophagitis: Secondary | ICD-10-CM | POA: Diagnosis not present

## 2023-03-10 DIAGNOSIS — Z1212 Encounter for screening for malignant neoplasm of rectum: Secondary | ICD-10-CM

## 2023-03-10 DIAGNOSIS — R194 Change in bowel habit: Secondary | ICD-10-CM | POA: Diagnosis not present

## 2023-03-10 MED ORDER — FOLIC ACID 1 MG PO TABS
1.0000 mg | ORAL_TABLET | Freq: Every day | ORAL | 3 refills | Status: AC
  Filled 2023-03-10: qty 90, 90d supply, fill #0

## 2023-03-10 MED ORDER — NA SULFATE-K SULFATE-MG SULF 17.5-3.13-1.6 GM/177ML PO SOLN
1.0000 | Freq: Once | ORAL | 0 refills | Status: AC
  Filled 2023-03-10: qty 354, 1d supply, fill #0

## 2023-03-10 NOTE — Progress Notes (Signed)
HPI : April [name] is a very pleasant 47 year old female with a history of anxiety, depression, rheumatoid arthritis and GERD who is referred to [place] by Dr. Sanda Linger for further evaluation of recent worsening of GERD symptoms and change in bowel habits.  The patient states that she has had GERD symptoms for 20+ years which has been mostly controlled with Nexium in the morning and Pepcid in the evening.  After losing weight in the past 2 years, she has started regaining this weight, and her GERD symptoms have worsened with this weight gain.  Overall, as long as she takes her Nexium every day in the morning and Pepcid at night, her symptoms are fairly well-controlled.  If she does not take the Pepcid at bedtime, she will wake up in the melanite with acid regurgitation, burning pain in her chest and throat and sore throat the next day. She had an upper endoscopy to evaluate uncontrolled GERD symptoms in 2020 which was unremarkable except for H. pylori negative gastritis.  Also over the past year, she has noted a change in her stool consistency.  Her stools are frequently poorly formed.  She chronically has issues with constipation, but recently she has had poorly formed stools that are foul-smelling and different from her baseline.  She does have a bowel movement pretty much every day.  Straining is not a problem for her.  She has been taking MiraLAX daily for the past few months  She has had attacks of lower abdominal pain associated with nausea and vomiting over the past year.  These episodes are infrequent, occurring about every 2 to 3 months.  She thinks she has had about 4 episodes total.  She denies any other symptoms such as excessive diarrhea or straining with bowel movements. She has noticed blood on the toilet paper on occasion, but this is infrequent.  She has no family history of colon cancer.  Her hemoglobin has been stable.  EGD Sept 2020 (epigastric pain, heartburn):  Dr. Christella Hartigan -  Mild gastritis, biospied to check for H. pylori. - The examination was otherwise normal   RUQUS Aug 2022 IMPRESSION: Normal ultrasound of the right upper quadrant  Past Medical History:  Diagnosis Date   ALLERGIC RHINITIS    Anal fissure    Anxiety    ANXIETY DEPRESSION    Back pain    CONSTIPATION    Constipation    DEPRESSION    Esophageal reflux    Food allergy    Avacados   Gastritis    GERD (gastroesophageal reflux disease)    HYPERCHOLESTEROLEMIA    Hyperlipidemia    Hypertension    OBESITY    Occipital neuralgia    Palpitations    Rheumatoid arthritis 12/24/2017     Past Surgical History:  Procedure Laterality Date   NO PAST SURGERIES     Family History  Problem Relation Age of Onset   Cancer Mother    Hypertension Mother    Diabetes Mother    Hyperlipidemia Mother    Breast cancer Mother 8   Osteoarthritis Mother    Colon polyps Mother    Anxiety disorder Mother    Sleep apnea Mother    Eating disorder Mother    Obesity Mother    Heart failure Father    Hyperlipidemia Father    Coronary artery disease Father        MI age 18   Prostate cancer Father    Colon polyps Father  Bladder Cancer Father    Cancer - Prostate Father    Macular degeneration Father    Hyperlipidemia Brother    Stomach cancer Paternal Grandmother    Colon cancer Neg Hx    Liver disease Neg Hx    Esophageal cancer Neg Hx    Social History   Tobacco Use   Smoking status: Never    Passive exposure: Never   Smokeless tobacco: Never   Tobacco comments:    Married, lives with spouse  Vaping Use   Vaping Use: Never used  Substance Use Topics   Alcohol use: No   Drug use: No   Current Outpatient Medications  Medication Sig Dispense Refill   ascorbic acid (VITAMIN C) 1000 MG tablet Take 1,000 mg by mouth daily.     busPIRone (BUSPAR) 15 MG tablet Take 1 tablet (15 mg total) by mouth 2 (two) times daily. (Patient taking differently: Take 15 mg by mouth. Bid as  needed) 180 tablet 1   celecoxib (CELEBREX) 200 MG capsule Take 1 capsule (200 mg total) by mouth 2 (two) times daily as needed with food 180 capsule 1   certolizumab pegol (CIMZIA, 2 SYRINGE,) 200 MG/ML prefilled syringe Use 1 injection subcutaneously every other week 1 each 4   cetirizine (ZYRTEC) 10 MG tablet Take 1 tablet (10 mg total) by mouth daily. 30 tablet 3   Cholecalciferol (VITAMIN D) 50 MCG (2000 UT) CAPS Take 1 capsule by mouth daily.     clobetasol ointment (TEMOVATE) 0.05 % as needed.  0   escitalopram (LEXAPRO) 20 MG tablet Take 1 tablet (20 mg total) by mouth daily. 90 tablet 0   esomeprazole (NEXIUM) 40 MG capsule Take 1 capsule (40 mg total) by mouth daily. 90 capsule 0   etonogestrel-ethinyl estradiol (NUVARING) 0.12-0.015 MG/24HR vaginal ring REMOVE AND INSERT A NEW RING INTO THE VAGINA EVERY 4 WEEKS *USE CONTINUOUSLY* 3 each 4   famotidine (PEPCID) 20 MG tablet Take 20 mg by mouth at bedtime. Take 1 tablets by mouth daily at bedtime     fenofibrate 160 MG tablet Take 1 tablet (160 mg total) by mouth daily. 90 tablet 1   folic acid (FOLVITE) 1 MG tablet Take 1 tablet (1 mg total) by mouth daily. 90 tablet 3   ibuprofen (ADVIL) 800 MG tablet Take 1 tablet (800 mg total) by mouth every 8 (eight) hours as needed. 90 tablet 0   losartan (COZAAR) 50 MG tablet Take 1 tablet (50 mg total) by mouth daily. 90 tablet 0   MAGNESIUM MALATE PO Take 1 tablet by mouth at bedtime.     methotrexate 50 MG/2ML injection Inject 1 mL (25 mg total) into the skin once a week. 12 mL 0   montelukast (SINGULAIR) 10 MG tablet Take 1 tablet (10 mg total) by mouth at bedtime. 90 tablet 1   Multiple Vitamin (MULTIVITAMIN) tablet Take 1 tablet by mouth daily.     Omega-3 Fatty Acids (FISH OIL) 1000 MG CAPS Take 2 capsules by mouth 2 (two) times daily.      zolpidem (AMBIEN) 5 MG tablet Take 1 tablet (5 mg total) by mouth at bedtime as needed for sleep. 90 tablet 1   No current facility-administered  medications for this visit.   Allergies  Allergen Reactions   Ciprofloxacin     toxicity   Levaquin [Levofloxacin] Anxiety    Possible CNS side effects. TOXICITY   Moxifloxacin     TOXICITY   Avocado  Other reaction(s): Unknown     Review of Systems: All systems reviewed and negative except where noted in HPI.    No results found.  Physical Exam: BP 130/80   Pulse 89   Ht  (1.778 m)   Wt 250 lb (113.4 kg)   BMI 35.87 kg/m  Constitutional: Pleasant,well-developed, Caucasian female in no acute distress. HEENT: Normocephalic and atraumatic. Conjunctivae are normal. No scleral icterus. Neck supple.  Cardiovascular: Normal rate, regular rhythm.  Pulmonary/chest: Effort normal and breath sounds normal. No wheezing, rales or rhonchi. Abdominal: Soft, nondistended, tenderness to palpation in the bilateral lower quadrants, without rigidity or guarding bowel sounds active throughout. There are no masses palpable. No hepatomegaly. Extremities: no edema Lymphadenopathy: No cervical adenopathy noted. Neurological: Alert and oriented to person place and time. Skin: Skin is warm and dry. No rashes noted. Psychiatric: Normal mood and affect. Behavior is normal.  CBC    Component Value Date/Time   WBC 6.9 05/23/2022 0000   WBC 5.0 05/26/2020 0841   RBC 4.17 10/16/2021 0814   RBC 4.22 05/11/2021 0000   HGB 11.9 (A) 05/23/2022 0000   HGB 12.5 10/16/2021 0814   HCT 36 05/23/2022 0000   HCT 37.3 10/16/2021 0814   PLT 361 05/23/2022 0000   PLT 379 10/16/2021 0814   MCV 89 10/16/2021 0814   MCH 30.0 10/16/2021 0814   MCH 29.2 05/26/2020 0841   MCHC 33.5 10/16/2021 0814   MCHC 32.3 05/26/2020 0841   RDW 13.0 10/16/2021 0814   LYMPHSABS 1.6 10/16/2021 0814   MONOABS 0.3 12/15/2018 1402   EOSABS 0.3 10/16/2021 0814   BASOSABS 0.0 10/16/2021 0814    CMP     Component Value Date/Time   NA 141 09/17/2022 0944   K 4.5 09/17/2022 0944   CL 104 09/17/2022 0944   CO2 23  09/17/2022 0944   GLUCOSE 83 09/17/2022 0944   GLUCOSE 88 05/26/2020 0841   BUN 12 09/17/2022 0944   CREATININE 0.72 09/17/2022 0944   CREATININE 0.87 05/26/2020 0841   CALCIUM 10.0 09/17/2022 0944   PROT 7.3 09/17/2022 0944   ALBUMIN 4.1 09/17/2022 0944   AST 21 09/17/2022 0944   ALT 21 09/17/2022 0944   ALKPHOS 63 09/17/2022 0944   BILITOT 0.3 09/17/2022 0944   GFRNONAA >60 12/15/2018 1402   GFRAA >60 12/15/2018 1402     ASSESSMENT AND PLAN:  47 year old female with longstanding typical GERD symptoms, fairly well-controlled with Nexium 40 mg in the morning and Pepcid 20 mg in the evening.  She does have a recent exacerbation of symptoms, attributed to weight gain.  She had an upper endoscopy 4 years ago to evaluate her GERD symptoms which was unremarkable. She has never had a colonoscopy.  She is due for colon cancer screening.  She has had a recent change in her bowel habits characterized by a change in her stool consistency and foul odor associated with her stools.  She denies any recent major changes in her diet or medications that have accompanied her change in stool consistency.  I recommended she start taking Metamucil on a daily basis to improve her stool bulk and consistency.  Her symptoms itself do not warrant a colonoscopy, but she is due for a colonoscopy regardless for colon cancer screening.  This will exclude any mass lesion/neoplasia as a cause of her change in stool consistency.  CRC screening - Colonoscopy  GERD - Dietary modifications, weight loss - Continue Nexium/Pepcid  Change in bowel habits -  Metamucil  The details, risks (including bleeding, perforation, infection, missed lesions, medication reactions and possible hospitalization or surgery if complications occur), benefits, and alternatives to colonoscopy with possible biopsy and possible polypectomy were discussed with the patient and she consents to proceed.   Kyron Schlitt E. Tomasa Rand, MD [facility]  CC: Etta Grandchild, MD

## 2023-03-10 NOTE — Patient Instructions (Addendum)
_______________________________________________________  If your blood pressure at your visit was 140/90 or greater, please contact your primary care physician to follow up on this.  _______________________________________________________  If you are age 46 or older, your body mass index should be between 23-30. Your Body mass index is 35.87 kg/m. If this is out of the aforementioned range listed, please consider follow up with your Primary Care Provider.  If you are age 32 or younger, your body mass index should be between 19-25. Your Body mass index is 35.87 kg/m. If this is out of the aformentioned range listed, please consider follow up with your Primary Care Provider.    You have been scheduled for a colonoscopy. Please follow written instructions given to you at your visit today.  Please pick up your prep supplies at the pharmacy within the next 1-3 days. If you use inhalers (even only as needed), please bring them with you on the day of your procedure.  Continue Nexium / Pepcid for GERD.  Start Metamucil capsules daily.   The  GI providers would like to encourage you to use [facility] to communicate with providers for non-urgent requests or questions.  Due to long hold times on the telephone, sending your provider a message by [facility] may be a faster and more efficient way to get a response.  Please allow 48 business hours for a response.  Please remember that this is for non-urgent requests.   It was a pleasure to see you today!  Thank you for trusting me with your gastrointestinal care!    Scott E.Tomasa Rand, MD

## 2023-03-12 ENCOUNTER — Other Ambulatory Visit (HOSPITAL_COMMUNITY): Payer: Self-pay

## 2023-03-12 ENCOUNTER — Other Ambulatory Visit: Payer: Self-pay

## 2023-03-13 ENCOUNTER — Other Ambulatory Visit: Payer: Self-pay

## 2023-03-26 ENCOUNTER — Other Ambulatory Visit (HOSPITAL_COMMUNITY): Payer: Self-pay

## 2023-03-26 MED ORDER — SODIUM FLUORIDE 5000 SENSITIVE 1.1-5 % DT GEL
DENTAL | 6 refills | Status: DC
  Filled 2023-03-26: qty 100, 30d supply, fill #0

## 2023-03-28 ENCOUNTER — Ambulatory Visit (AMBULATORY_SURGERY_CENTER): Payer: Commercial Managed Care - PPO | Admitting: Gastroenterology

## 2023-03-28 ENCOUNTER — Encounter: Payer: Self-pay | Admitting: Gastroenterology

## 2023-03-28 VITALS — BP 144/96 | HR 71 | Temp 98.1°F | Resp 20 | Ht 70.0 in | Wt 250.0 lb

## 2023-03-28 DIAGNOSIS — Z1211 Encounter for screening for malignant neoplasm of colon: Secondary | ICD-10-CM | POA: Diagnosis not present

## 2023-03-28 MED ORDER — SODIUM CHLORIDE 0.9 % IV SOLN
500.0000 mL | Freq: Once | INTRAVENOUS | Status: DC
Start: 2023-03-28 — End: 2023-03-28

## 2023-03-28 NOTE — Op Note (Signed)
[facility] Patient Name: April [name] Procedure Date: 03/28/2023 8:10 AM MRN: [id number] Endoscopist: Lorin Picket E. Tomasa Rand , MD, [id number] Age: 47 Referring MD:  Date of Birth: May 10, 1976 Gender: Female Account #: [id number] Procedure:                Colonoscopy Indications:              Screening for colorectal malignant neoplasm, This                            is the patient's first colonoscopy Medicines:                Monitored Anesthesia Care Procedure:                Pre-Anesthesia Assessment:                           - Prior to the procedure, a History and Physical                            was performed, and patient medications and                            allergies were reviewed. The patient's tolerance of                            previous anesthesia was also reviewed. The risks                            and benefits of the procedure and the sedation                            options and risks were discussed with the patient.                            All questions were answered, and informed consent                            was obtained. Prior Anticoagulants: The patient has                            taken no anticoagulant or antiplatelet agents. ASA                            Grade Assessment: III - A patient with severe                            systemic disease. After reviewing the risks and                            benefits, the patient was deemed in satisfactory                            condition to undergo the procedure.  After obtaining informed consent, the colonoscope                            was passed under direct vision. Throughout the                            procedure, the patient's blood pressure, pulse, and                            oxygen saturations were monitored continuously. The                            CF [id number] #[id number] was introduced through the anus                            and  advanced to the the terminal ileum, with                            identification of the appendiceal orifice and IC                            valve. The colonoscopy was somewhat difficult due                            to a tortuous colon. The patient tolerated the                            procedure well. The quality of the bowel                            preparation was excellent. The terminal ileum,                            ileocecal valve, appendiceal orifice, and rectum                            were photographed. The bowel preparation used was                            SUPREP via split dose instruction. Scope In: 8:20:53 AM Scope Out: 8:36:49 AM Scope Withdrawal Time: 0 hours 8 minutes 26 seconds  Total Procedure Duration: 0 hours 15 minutes 56 seconds  Findings:                 The perianal and digital rectal examinations were                            normal. Pertinent negatives include normal                            sphincter tone and no palpable rectal lesions.                           The colon (entire examined portion) appeared normal.  The terminal ileum appeared normal.                           The retroflexed view of the distal rectum and anal                            verge was normal and showed no anal or rectal                            abnormalities. Complications:            No immediate complications. Estimated Blood Loss:     Estimated blood loss: none. Impression:               - The entire examined colon is normal.                           - The examined portion of the ileum was normal.                           - The distal rectum and anal verge are normal on                            retroflexion view.                           - No specimens collected. Recommendation:           - Patient has a contact number available for                            emergencies. The signs and symptoms of potential                             delayed complications were discussed with the                            patient. Return to normal activities tomorrow.                            Written discharge instructions were provided to the                            patient.                           - Resume previous diet.                           - Continue present medications.                           - Repeat colonoscopy in 10 years for screening                            purposes. Veena Sturgess E. Tomasa Rand, MD 03/28/2023 8:40:55 AM This report has been signed electronically.

## 2023-03-28 NOTE — Progress Notes (Signed)
A and O x3. Report to RN. Tolerated MAC anesthesia well. 

## 2023-03-28 NOTE — Progress Notes (Signed)
History and Physical Interval Note:  03/28/2023 8:13 AM  April [name]  has presented today for endoscopic procedure(s), with the diagnosis of  Encounter Diagnosis  Name Primary?   Screening for colorectal cancer Yes  .  The various methods of evaluation and treatment have been discussed with the patient and/or family. After consideration of risks, benefits and other options for treatment, the patient has consented to  the endoscopic procedure(s).   The patient's history has been reviewed, patient examined, no change in status, stable for endoscopic procedure(s).  I have reviewed the patient's chart and labs.  Questions were answered to the patient's satisfaction.     Edan Juday E. Tomasa Rand, MD [facility]

## 2023-03-28 NOTE — Patient Instructions (Signed)
-  repeat colonoscopy in 10 years for surveillance recommended.  -Continue present medications   YOU HAD AN ENDOSCOPIC PROCEDURE TODAY AT THE [facility]:   Refer to the procedure report that was given to you for any specific questions about what was found during the examination.  If the procedure report does not answer your questions, please call your gastroenterologist to clarify.  If you requested that your care partner not be given the details of your procedure findings, then the procedure report has been included in a sealed envelope for you to review at your convenience later.  YOU SHOULD EXPECT: Some feelings of bloating in the abdomen. Passage of more gas than usual.  Walking can help get rid of the air that was put into your GI tract during the procedure and reduce the bloating. If you had a lower endoscopy (such as a colonoscopy or flexible sigmoidoscopy) you may notice spotting of blood in your stool or on the toilet paper. If you underwent a bowel prep for your procedure, you may not have a normal bowel movement for a few days.  Please Note:  You might notice some irritation and congestion in your nose or some drainage.  This is from the oxygen used during your procedure.  There is no need for concern and it should clear up in a day or so.  SYMPTOMS TO REPORT IMMEDIATELY:  Following lower endoscopy (colonoscopy or flexible sigmoidoscopy):  Excessive amounts of blood in the stool  Significant tenderness or worsening of abdominal pains  Swelling of the abdomen that is new, acute  Fever of 100F or higher   For urgent or emergent issues, a gastroenterologist can be reached at any hour by calling [phone]. Do not use MyChart messaging for urgent concerns.    DIET:  We do recommend a small meal at first, but then you may proceed to your regular diet.  Drink plenty of fluids but you should avoid alcoholic beverages for 24 hours.  ACTIVITY:  You should plan to take it  easy for the rest of today and you should NOT DRIVE or use heavy machinery until tomorrow (because of the sedation medicines used during the test).    FOLLOW UP: Our staff will call the number listed on your records the next business day following your procedure.  We will call around 7:15- 8:00 am to check on you and address any questions or concerns that you may have regarding the information given to you following your procedure. If we do not reach you, we will leave a message.     If any biopsies were taken you will be contacted by phone or by letter within the next 1-3 weeks.  Please call us at [phone] if you have not heard about the biopsies in 3 weeks.    SIGNATURES/CONFIDENTIALITY: You and/or your care partner have signed paperwork which will be entered into your electronic medical record.  These signatures attest to the fact that that the information above on your After Visit Summary has been reviewed and is understood.  Full responsibility of the confidentiality of this discharge information lies with you and/or your care-partner.  

## 2023-03-31 ENCOUNTER — Telehealth: Payer: Self-pay

## 2023-03-31 NOTE — Telephone Encounter (Signed)
  Follow up Call-     03/28/2023    7:43 AM  Call back number  Post procedure Call Back phone  # [phone]  Permission to leave phone message Yes     Patient questions:  Do you have a fever, pain , or abdominal swelling? No. Pain Score  0 *  Have you tolerated food without any problems? Yes.    Have you been able to return to your normal activities? Yes.    Do you have any questions about your discharge instructions: Diet   No. Medications  No. Follow up visit  No.  Do you have questions or concerns about your Care? No.  Actions: * If pain score is 4 or above: No action needed, pain <4.

## 2023-04-02 NOTE — Progress Notes (Signed)
[name] [address] [place] [address] Phone: [phone] Subjective:   [name], am serving as a scribe for Dr. Antoine Primas.  I'm seeing this patient by the request  of:  Etta Grandchild, MD  CC: back and elbow pain   [id number]  April [name] is a 47 y.o. female coming in with complaint of back and neck pain. OMT 02/20/2023. Also f/u for R elbow pain. Patient states elbow slowly progressing in positive direction.  Patient is also feeling that the back is doing relatively well.  Some mild discomfort but nothing that is severe.  Medications patient has been prescribed: None          Reviewed prior external information including notes and imaging from previsou exam, outside providers and external EMR if available.   As well as notes that were available from care everywhere and other healthcare systems.  Past medical history, social, surgical and family history all reviewed in electronic medical record.  No pertanent information unless stated regarding to the chief complaint.   Past Medical History:  Diagnosis Date   ALLERGIC RHINITIS    Anal fissure    Anxiety    ANXIETY DEPRESSION    Back pain    CONSTIPATION    Constipation    DEPRESSION    Esophageal reflux    Food allergy    Avacados   Gastritis    GERD (gastroesophageal reflux disease)    HYPERCHOLESTEROLEMIA    Hyperlipidemia    Hypertension    OBESITY    Occipital neuralgia    Palpitations    Rheumatoid arthritis (HCC) 12/24/2017    Allergies  Allergen Reactions   Ciprofloxacin     toxicity   Levaquin [Levofloxacin] Anxiety    Possible CNS side effects. TOXICITY   Moxifloxacin     TOXICITY   Avocado     Other reaction(s): Unknown     Review of Systems:  No headache, visual changes, nausea, vomiting, diarrhea, constipation, dizziness, abdominal pain, skin rash, fevers, chills, night sweats, weight loss, swollen lymph nodes, body aches,  joint swelling, chest pain, shortness of breath, mood changes. POSITIVE muscle aches  Objective  Blood pressure 112/66, pulse 85, height 5\' 10"  (1.778 m), weight 253 lb (114.8 kg), SpO2 97 %.   General: No apparent distress alert and oriented x3 mood and affect normal, dressed appropriately.  HEENT: Pupils equal, extraocular movements intact  Respiratory: Patient's speak in full sentences and does not appear short of breath  Cardiovascular: No lower extremity edema, non tender, no erythema  MSK:  Back exam shows there is have some tightness still noted over the sacroiliac joint bilaterally.  Tightness with Pearlean Brownie right greater than left.  Osteopathic findings  C2 flexed rotated and side bent right C7 flexed rotated and side bent left T5 extended rotated and side bent right inhaled rib T9 extended rotated and side bent left L2 flexed rotated and side bent right L5 flexed rotated and side bent left Sacrum right on right       Assessment and Plan:  SI (sacroiliac) joint dysfunction Discussed HEP  Discussed which activities to do and which ones to avoid discussed with the patient does have still weakness of the hip abductors.  Will continue to work on the core strengthening.  Follow-up again 6 to 8 weeks   Right lateral epicondylitis Patient does have more lateral epicondylitis still limited.  Continue to monitor.  No other significant changes at this  moment.  Follow-up again in 6 to 8 weeks    Nonallopathic problems  Decision today to treat with OMT was based on Physical Exam  After verbal consent patient was treated with HVLA, ME, FPR techniques in cervical, rib, thoracic, lumbar, and sacral  areas  Patient tolerated the procedure well with improvement in symptoms  Patient given exercises, stretches and lifestyle modifications  See medications in patient instructions if given  Patient will follow up in 4-8 weeks    The above documentation has been reviewed and is  accurate and complete Judi Saa, DO          Note: This dictation was prepared with Dragon dictation along with smaller phrase technology. Any transcriptional errors that result from this process are unintentional.

## 2023-04-04 ENCOUNTER — Encounter: Payer: Self-pay | Admitting: Family Medicine

## 2023-04-04 ENCOUNTER — Ambulatory Visit: Payer: Commercial Managed Care - PPO | Admitting: Family Medicine

## 2023-04-04 VITALS — BP 112/66 | HR 85 | Ht 70.0 in | Wt 253.0 lb

## 2023-04-04 DIAGNOSIS — M9904 Segmental and somatic dysfunction of sacral region: Secondary | ICD-10-CM

## 2023-04-04 DIAGNOSIS — M9901 Segmental and somatic dysfunction of cervical region: Secondary | ICD-10-CM | POA: Diagnosis not present

## 2023-04-04 DIAGNOSIS — M7711 Lateral epicondylitis, right elbow: Secondary | ICD-10-CM

## 2023-04-04 DIAGNOSIS — M9903 Segmental and somatic dysfunction of lumbar region: Secondary | ICD-10-CM

## 2023-04-04 DIAGNOSIS — M533 Sacrococcygeal disorders, not elsewhere classified: Secondary | ICD-10-CM | POA: Diagnosis not present

## 2023-04-04 DIAGNOSIS — M9902 Segmental and somatic dysfunction of thoracic region: Secondary | ICD-10-CM | POA: Diagnosis not present

## 2023-04-04 DIAGNOSIS — M9908 Segmental and somatic dysfunction of rib cage: Secondary | ICD-10-CM

## 2023-04-04 NOTE — Assessment & Plan Note (Addendum)
Discussed HEP  Discussed which activities to do and which ones to avoid discussed with the patient does have still weakness of the hip abductors.  Will continue to work on the core strengthening.  Follow-up again 6 to 8 weeks

## 2023-04-04 NOTE — Patient Instructions (Signed)
Good to see you! Enjoy the puppy See you again in 2 months

## 2023-04-04 NOTE — Assessment & Plan Note (Signed)
Patient does have more lateral epicondylitis still limited.  Continue to monitor.  No other significant changes at this moment.  Follow-up again in 6 to 8 weeks

## 2023-04-25 ENCOUNTER — Other Ambulatory Visit (HOSPITAL_COMMUNITY): Payer: Self-pay

## 2023-04-25 ENCOUNTER — Other Ambulatory Visit: Payer: Self-pay

## 2023-04-25 ENCOUNTER — Encounter: Payer: Self-pay | Admitting: Internal Medicine

## 2023-05-16 ENCOUNTER — Other Ambulatory Visit: Payer: Self-pay

## 2023-05-20 ENCOUNTER — Other Ambulatory Visit: Payer: Self-pay

## 2023-05-26 ENCOUNTER — Other Ambulatory Visit: Payer: Self-pay | Admitting: Internal Medicine

## 2023-05-26 ENCOUNTER — Other Ambulatory Visit (HOSPITAL_COMMUNITY): Payer: Self-pay

## 2023-05-26 MED ORDER — MONTELUKAST SODIUM 10 MG PO TABS
10.0000 mg | ORAL_TABLET | Freq: Every day | ORAL | 1 refills | Status: DC
  Filled 2023-05-26 – 2023-05-27 (×2): qty 90, 90d supply, fill #0
  Filled 2023-08-21 – 2023-08-22 (×2): qty 90, 90d supply, fill #1

## 2023-05-26 MED ORDER — ESCITALOPRAM OXALATE 20 MG PO TABS
20.0000 mg | ORAL_TABLET | Freq: Every day | ORAL | 0 refills | Status: DC
  Filled 2023-05-26 – 2023-05-27 (×2): qty 90, 90d supply, fill #0

## 2023-05-26 MED ORDER — ESOMEPRAZOLE MAGNESIUM 40 MG PO CPDR
40.0000 mg | DELAYED_RELEASE_CAPSULE | Freq: Every day | ORAL | 0 refills | Status: DC
  Filled 2023-05-26 – 2023-05-27 (×2): qty 90, 90d supply, fill #0

## 2023-05-27 ENCOUNTER — Encounter: Payer: Self-pay | Admitting: Pharmacist

## 2023-05-27 ENCOUNTER — Other Ambulatory Visit (HOSPITAL_COMMUNITY): Payer: Self-pay

## 2023-05-27 ENCOUNTER — Other Ambulatory Visit: Payer: Self-pay

## 2023-05-27 MED ORDER — METHOTREXATE SODIUM CHEMO INJECTION 50 MG/2ML
INTRAMUSCULAR | 0 refills | Status: DC
  Filled 2023-05-27 (×2): qty 12, 84d supply, fill #0

## 2023-05-28 DIAGNOSIS — M0579 Rheumatoid arthritis with rheumatoid factor of multiple sites without organ or systems involvement: Secondary | ICD-10-CM | POA: Diagnosis not present

## 2023-05-30 NOTE — Progress Notes (Signed)
[name] [address] [place] [address] Phone: [phone] Subjective:   April [name], am serving as a scribe for Dr. Antoine Primas.  I'm seeing this patient by the request  of:  Etta Grandchild, MD  CC: back and neck pain follow up   [id number]:[id number]  April [name] is a 47 y.o. female coming in with complaint of back and neck pain. OMT on 04/04/2023. Also seen for R elbow pain. Patient states elbow is doing really well. Patient states she had her dog on its leash and the dog pulled her and she fell down 5 concrete steps and landed on the concrete patio. She fell on her right knee but the pain was in the left leg. Then a couple days later slipped on the dog slobber water and that muscle/tendon on the outer right leg that she could of pulled and she hurt her left big toe because it bent back the wrong way.         Reviewed prior external information including notes and imaging from previsou exam, outside providers and external EMR if available.   As well as notes that were available from care everywhere and other healthcare systems.  Past medical history, social, surgical and family history all reviewed in electronic medical record.  No pertanent information unless stated regarding to the chief complaint.   Past Medical History:  Diagnosis Date   ALLERGIC RHINITIS    Anal fissure    Anxiety    ANXIETY DEPRESSION    Back pain    CONSTIPATION    Constipation    DEPRESSION    Esophageal reflux    Food allergy    Avacados   Gastritis    GERD (gastroesophageal reflux disease)    HYPERCHOLESTEROLEMIA    Hyperlipidemia    Hypertension    OBESITY    Occipital neuralgia    Palpitations    Rheumatoid arthritis (HCC) 12/24/2017    Allergies  Allergen Reactions   Ciprofloxacin     toxicity   Levaquin [Levofloxacin] Anxiety    Possible CNS side effects. TOXICITY   Moxifloxacin     TOXICITY   Avocado     Other reaction(s):  Unknown     Review of Systems:  No headache, visual changes, nausea, vomiting, diarrhea, constipation, dizziness, abdominal pain, skin rash, fevers, chills, night sweats, weight loss, swollen lymph nodes, body aches, joint swelling, chest pain, shortness of breath, mood changes. POSITIVE muscle aches  Objective  Blood pressure 130/80, pulse 87, height 5\' 10"  (1.778 m), weight 260 lb (117.9 kg), SpO2 97%.   General: No apparent distress alert and oriented x3 mood and affect normal, dressed appropriately.  HEENT: Pupils equal, extraocular movements intact  Respiratory: Patient's speak in full sentences and does not appear short of breath  Cardiovascular: No lower extremity edema, non tender, no erythema  Low back does have loss of lordosis tightness noted in the sacral joint on the right side.  Neck exam does have some difficulty with sidebending bilaterally. Patient's first toe on the left foot does have what appears to be some swelling noted.  Some limited range of motion noted.  More consistent with a capsulitis.  Osteopathic findings  C3 flexed rotated and side bent right C7 flexed rotated and side bent left T3 extended rotated and side bent right inhaled rib T9 extended rotated and side bent left L2 flexed rotated and side bent right Sacrum right on right  Assessment and Plan:  SI (sacroiliac) joint dysfunction Chronic problem that seem to be exacerbated after a fall from walking the dog.  Discussed with patient to monitor dog walking mass effect.  Patient knows of home exercises and icing regimen.  Increase activity slowly.  Follow-up with me again in 6 to 8 weeks otherwise.  Capsulitis of toe of left foot Discussed the possibility of a carbon fiber plate, avoid being barefoot, should improve over the course of time.  Follow-up again 6 to 8 weeks worsening pain consider injection. Discussed patient Celebrex or ibuprofen that could be helpful.  Nonallopathic  problems  Decision today to treat with OMT was based on Physical Exam  After verbal consent patient was treated with HVLA, ME, FPR techniques in cervical, rib, thoracic, lumbar, and sacral  areas  Patient tolerated the procedure well with improvement in symptoms  Patient given exercises, stretches and lifestyle modifications  See medications in patient instructions if given  Patient will follow up in 4-8 weeks     The above documentation has been reviewed and is accurate and complete Judi Saa, DO         Note: This dictation was prepared with Dragon dictation along with smaller phrase technology. Any transcriptional errors that result from this process are unintentional.

## 2023-06-04 ENCOUNTER — Encounter: Payer: Self-pay | Admitting: Family Medicine

## 2023-06-04 ENCOUNTER — Ambulatory Visit: Payer: Commercial Managed Care - PPO | Admitting: Family Medicine

## 2023-06-04 VITALS — BP 130/80 | HR 87 | Ht 70.0 in | Wt 260.0 lb

## 2023-06-04 DIAGNOSIS — M9904 Segmental and somatic dysfunction of sacral region: Secondary | ICD-10-CM

## 2023-06-04 DIAGNOSIS — M7752 Other enthesopathy of left foot: Secondary | ICD-10-CM

## 2023-06-04 DIAGNOSIS — M9902 Segmental and somatic dysfunction of thoracic region: Secondary | ICD-10-CM | POA: Diagnosis not present

## 2023-06-04 DIAGNOSIS — M533 Sacrococcygeal disorders, not elsewhere classified: Secondary | ICD-10-CM | POA: Diagnosis not present

## 2023-06-04 DIAGNOSIS — M9908 Segmental and somatic dysfunction of rib cage: Secondary | ICD-10-CM

## 2023-06-04 DIAGNOSIS — M9901 Segmental and somatic dysfunction of cervical region: Secondary | ICD-10-CM | POA: Diagnosis not present

## 2023-06-04 DIAGNOSIS — M9903 Segmental and somatic dysfunction of lumbar region: Secondary | ICD-10-CM | POA: Diagnosis not present

## 2023-06-04 NOTE — Assessment & Plan Note (Signed)
Chronic problem that seem to be exacerbated after a fall from walking the dog.  Discussed with patient to monitor dog walking mass effect.  Patient knows of home exercises and icing regimen.  Increase activity slowly.  Follow-up with me again in 6 to 8 weeks otherwise.

## 2023-06-04 NOTE — Patient Instructions (Signed)
Good to see you  Make sure dog does not happen See me again in 6-8 weeks

## 2023-06-04 NOTE — Assessment & Plan Note (Signed)
Discussed the possibility of a carbon fiber plate, avoid being barefoot, should improve over the course of time.  Follow-up again 6 to 8 weeks worsening pain consider injection.

## 2023-06-09 ENCOUNTER — Other Ambulatory Visit: Payer: Self-pay | Admitting: Oncology

## 2023-06-09 DIAGNOSIS — Z006 Encounter for examination for normal comparison and control in clinical research program: Secondary | ICD-10-CM

## 2023-06-14 ENCOUNTER — Other Ambulatory Visit (HOSPITAL_COMMUNITY): Payer: Self-pay

## 2023-06-16 ENCOUNTER — Other Ambulatory Visit (HOSPITAL_COMMUNITY): Payer: Self-pay

## 2023-06-17 ENCOUNTER — Other Ambulatory Visit (HOSPITAL_COMMUNITY)
Admission: RE | Admit: 2023-06-17 | Discharge: 2023-06-17 | Disposition: A | Discharge status: Home / Self Care | Payer: Commercial Managed Care - PPO | Source: Ambulatory Visit | Attending: Oncology | Admitting: Oncology

## 2023-06-17 DIAGNOSIS — Z006 Encounter for examination for normal comparison and control in clinical research program: Secondary | ICD-10-CM | POA: Insufficient documentation

## 2023-06-18 ENCOUNTER — Other Ambulatory Visit: Payer: Self-pay

## 2023-06-18 ENCOUNTER — Other Ambulatory Visit (HOSPITAL_COMMUNITY): Payer: Self-pay

## 2023-06-20 ENCOUNTER — Other Ambulatory Visit: Payer: Self-pay | Admitting: Oncology

## 2023-06-20 DIAGNOSIS — Z139 Encounter for screening, unspecified: Secondary | ICD-10-CM

## 2023-06-23 ENCOUNTER — Other Ambulatory Visit (HOSPITAL_COMMUNITY)
Admission: RE | Admit: 2023-06-23 | Discharge: 2023-06-23 | Disposition: A | Discharge status: Home / Self Care | Payer: Self-pay | Source: Other Acute Inpatient Hospital | Attending: Oncology | Admitting: Oncology

## 2023-06-23 DIAGNOSIS — Z139 Encounter for screening, unspecified: Secondary | ICD-10-CM | POA: Insufficient documentation

## 2023-06-24 ENCOUNTER — Other Ambulatory Visit: Payer: Self-pay | Admitting: Internal Medicine

## 2023-06-24 ENCOUNTER — Other Ambulatory Visit: Payer: Self-pay | Admitting: Pharmacist

## 2023-06-24 ENCOUNTER — Other Ambulatory Visit (HOSPITAL_COMMUNITY): Payer: Self-pay

## 2023-06-24 ENCOUNTER — Other Ambulatory Visit: Payer: Self-pay

## 2023-06-24 MED ORDER — CIMZIA (2 SYRINGE) 200 MG/ML ~~LOC~~ PSKT
PREFILLED_SYRINGE | SUBCUTANEOUS | 4 refills | Status: DC
  Filled 2023-06-24: qty 1, fill #0

## 2023-06-24 MED ORDER — CIMZIA (2 SYRINGE) 200 MG/ML ~~LOC~~ PSKT
PREFILLED_SYRINGE | SUBCUTANEOUS | 4 refills | Status: DC
  Filled 2023-06-24: qty 1, 28d supply, fill #0
  Filled 2023-07-16: qty 1, 28d supply, fill #1
  Filled 2023-08-12: qty 1, 28d supply, fill #2
  Filled 2023-09-12: qty 1, 28d supply, fill #3
  Filled 2023-10-07: qty 1, 28d supply, fill #4

## 2023-06-25 ENCOUNTER — Other Ambulatory Visit (HOSPITAL_COMMUNITY): Payer: Self-pay

## 2023-06-29 ENCOUNTER — Other Ambulatory Visit: Payer: Self-pay | Admitting: Internal Medicine

## 2023-06-29 ENCOUNTER — Other Ambulatory Visit (HOSPITAL_COMMUNITY): Payer: Self-pay

## 2023-06-29 DIAGNOSIS — I10 Essential (primary) hypertension: Secondary | ICD-10-CM

## 2023-06-29 MED ORDER — LOSARTAN POTASSIUM 50 MG PO TABS
50.0000 mg | ORAL_TABLET | Freq: Every day | ORAL | 0 refills | Status: DC
Start: 2023-06-29 — End: 2023-10-04
  Filled 2023-06-29: qty 90, 90d supply, fill #0

## 2023-06-29 MED ORDER — FENOFIBRATE 160 MG PO TABS
160.0000 mg | ORAL_TABLET | Freq: Every day | ORAL | 0 refills | Status: DC
  Filled 2023-06-29: qty 90, 90d supply, fill #0

## 2023-06-30 ENCOUNTER — Other Ambulatory Visit: Payer: Self-pay

## 2023-07-03 ENCOUNTER — Ambulatory Visit: Payer: Commercial Managed Care - PPO | Admitting: Family Medicine

## 2023-07-15 ENCOUNTER — Other Ambulatory Visit (HOSPITAL_COMMUNITY): Payer: Self-pay

## 2023-07-16 ENCOUNTER — Other Ambulatory Visit (HOSPITAL_COMMUNITY): Payer: Self-pay

## 2023-07-18 NOTE — Progress Notes (Deleted)
  [name] [address] [place] [address] Phone: [phone] Subjective:    I'm seeing this patient by the request  of:  Etta Grandchild, MD  CC:   [id number]  Sandrine ZYIAH GELPI is a 47 y.o. female coming in with complaint of back and neck pain. OMT on 06/04/2023. Patient states   Medications patient has been prescribed:   Taking:         Reviewed prior external information including notes and imaging from previsou exam, outside providers and external EMR if available.   As well as notes that were available from care everywhere and other healthcare systems.  Past medical history, social, surgical and family history all reviewed in electronic medical record.  No pertanent information unless stated regarding to the chief complaint.   Past Medical History:  Diagnosis Date   ALLERGIC RHINITIS    Anal fissure    Anxiety    ANXIETY DEPRESSION    Back pain    CONSTIPATION    Constipation    DEPRESSION    Esophageal reflux    Food allergy    Avacados   Gastritis    GERD (gastroesophageal reflux disease)    HYPERCHOLESTEROLEMIA    Hyperlipidemia    Hypertension    OBESITY    Occipital neuralgia    Palpitations    Rheumatoid arthritis (HCC) 12/24/2017    Allergies  Allergen Reactions   Ciprofloxacin     toxicity   Levaquin [Levofloxacin] Anxiety    Possible CNS side effects. TOXICITY   Moxifloxacin     TOXICITY   Avocado     Other reaction(s): Unknown     Review of Systems:  No headache, visual changes, nausea, vomiting, diarrhea, constipation, dizziness, abdominal pain, skin rash, fevers, chills, night sweats, weight loss, swollen lymph nodes, body aches, joint swelling, chest pain, shortness of breath, mood changes. POSITIVE muscle aches  Objective  There were no vitals taken for this visit.   General: No apparent distress alert and oriented x3 mood and affect normal, dressed appropriately.  HEENT: Pupils  equal, extraocular movements intact  Respiratory: Patient's speak in full sentences and does not appear short of breath  Cardiovascular: No lower extremity edema, non tender, no erythema  Gait MSK:  Back   Osteopathic findings  C2 flexed rotated and side bent right C6 flexed rotated and side bent left T3 extended rotated and side bent right inhaled rib T9 extended rotated and side bent left L2 flexed rotated and side bent right Sacrum right on right       Assessment and Plan:  No problem-specific Assessment & Plan notes found for this encounter.    Nonallopathic problems  Decision today to treat with OMT was based on Physical Exam  After verbal consent patient was treated with HVLA, ME, FPR techniques in cervical, rib, thoracic, lumbar, and sacral  areas  Patient tolerated the procedure well with improvement in symptoms  Patient given exercises, stretches and lifestyle modifications  See medications in patient instructions if given  Patient will follow up in 4-8 weeks             Note: This dictation was prepared with Dragon dictation along with smaller phrase technology. Any transcriptional errors that result from this process are unintentional.

## 2023-07-28 ENCOUNTER — Ambulatory Visit: Payer: Commercial Managed Care - PPO | Admitting: Family Medicine

## 2023-08-08 ENCOUNTER — Ambulatory Visit: Payer: Commercial Managed Care - PPO | Admitting: Family Medicine

## 2023-08-09 ENCOUNTER — Encounter (HOSPITAL_COMMUNITY): Payer: Self-pay

## 2023-08-11 ENCOUNTER — Ambulatory Visit: Payer: Commercial Managed Care - PPO | Attending: Internal Medicine | Admitting: Pharmacist

## 2023-08-11 DIAGNOSIS — Z79899 Other long term (current) drug therapy: Secondary | ICD-10-CM

## 2023-08-11 NOTE — Progress Notes (Signed)
   S: Patient presents today telephonically for review of their specialty medication.   Patient is taking Cimzia (certolizumab) for rheumatoid arthritis. She is managed by Zenovia Jordan for this.   Dosing: Note: Each 400 mg dose should be administered as 2 injections of 200 mg each. Rheumatoid arthritis: SubQ: Initial: 400 mg, repeat dose 2 and 4 weeks after initial dose; Maintenance: 200 mg every other week. May consider maintenance dose of 400 mg every 4 weeks. May be administered alone or in combination with methotrexate.  Efficacy: admits that the efficacy has waned quite a bit since our last visit. She plans to discuss this with RA coming up.   Monitoring: none  Adverse events: none  O:   Lab Results  Component Value Date   WBC 6.9 05/23/2022   HGB 11.9 (A) 05/23/2022   HCT 36 05/23/2022   MCV 89 10/16/2021   PLT 361 05/23/2022      Chemistry      Component Value Date/Time   NA 141 09/17/2022 0944   K 4.5 09/17/2022 0944   CL 104 09/17/2022 0944   CO2 23 09/17/2022 0944   BUN 12 09/17/2022 0944   CREATININE 0.72 09/17/2022 0944   CREATININE 0.87 05/26/2020 0841   GLU 71 05/24/2022 0000      Component Value Date/Time   CALCIUM 10.0 09/17/2022 0944   ALKPHOS 63 09/17/2022 0944   AST 21 09/17/2022 0944   ALT 21 09/17/2022 0944   BILITOT 0.3 09/17/2022 0944     A/P: 1. Medication review: patient currently taking Cimzia for rheumatoid arthritis. Reviewed the medication with the patient, including the following: Cimzia is a TNF blocking agent used in the treatment of rheumatoid arthritis. She will review videos on how to administer the medication. Possible adverse effects include GI upset, antibody development, increased risk of infection, hypersensitivity, demyelinating CNS disease, hematologic effects, and increased risk of malignancy. Use with caution in patients with heart failure. Avoid use of live vaccinations and let doctor know of any infections as treatment  with Cimzia may need to be held during illness. No recommendations for any changes.  Butch Penny, PharmD, Patsy [place], CPP Clinical Pharmacist [facility] [phone]

## 2023-08-12 ENCOUNTER — Other Ambulatory Visit: Payer: Self-pay

## 2023-08-12 ENCOUNTER — Other Ambulatory Visit (HOSPITAL_COMMUNITY): Payer: Self-pay

## 2023-08-12 NOTE — Progress Notes (Signed)
Specialty Pharmacy Refill Coordination Note  April [name] is a 47 y.o. female contacted today regarding refills of specialty medication(s) Certolizumab Pegol .  Patient requested Delivery  on 08/20/23  to verified address [address] [place], [address]   Medication will be filled on 08/19/2023.

## 2023-08-13 LAB — HELIX MOLECULAR SCREEN: Genetic Analysis Overall Interpretation: NEGATIVE

## 2023-08-18 ENCOUNTER — Other Ambulatory Visit (HOSPITAL_COMMUNITY): Payer: Self-pay

## 2023-08-19 ENCOUNTER — Encounter (HOSPITAL_COMMUNITY): Payer: Self-pay

## 2023-08-19 ENCOUNTER — Other Ambulatory Visit: Payer: Self-pay

## 2023-08-19 ENCOUNTER — Other Ambulatory Visit (HOSPITAL_COMMUNITY): Payer: Self-pay

## 2023-08-19 MED ORDER — METHOTREXATE SODIUM CHEMO INJECTION 50 MG/2ML
25.0000 mg | INTRAMUSCULAR | 0 refills | Status: DC
  Filled 2023-08-19 (×2): qty 12, 84d supply, fill #0

## 2023-08-20 ENCOUNTER — Other Ambulatory Visit: Payer: Self-pay

## 2023-08-20 ENCOUNTER — Other Ambulatory Visit (HOSPITAL_COMMUNITY): Payer: Self-pay

## 2023-08-21 ENCOUNTER — Other Ambulatory Visit: Payer: Self-pay

## 2023-08-22 ENCOUNTER — Other Ambulatory Visit: Payer: Self-pay

## 2023-08-22 ENCOUNTER — Other Ambulatory Visit (HOSPITAL_COMMUNITY): Payer: Self-pay

## 2023-08-25 ENCOUNTER — Other Ambulatory Visit (HOSPITAL_COMMUNITY): Payer: Self-pay

## 2023-08-30 ENCOUNTER — Encounter: Payer: Self-pay | Admitting: Internal Medicine

## 2023-09-01 ENCOUNTER — Other Ambulatory Visit: Payer: Self-pay | Admitting: Internal Medicine

## 2023-09-01 ENCOUNTER — Other Ambulatory Visit: Payer: Self-pay

## 2023-09-01 ENCOUNTER — Other Ambulatory Visit (HOSPITAL_COMMUNITY): Payer: Self-pay

## 2023-09-01 DIAGNOSIS — E781 Pure hyperglyceridemia: Secondary | ICD-10-CM | POA: Insufficient documentation

## 2023-09-01 MED ORDER — OMEGA-3-ACID ETHYL ESTERS 1 G PO CAPS
2.0000 g | ORAL_CAPSULE | Freq: Two times a day (BID) | ORAL | 0 refills | Status: DC
Start: 2023-09-01 — End: 2023-12-02
  Filled 2023-09-01: qty 360, 90d supply, fill #0
  Filled 2023-09-01: qty 240, 60d supply, fill #0
  Filled 2023-09-01: qty 120, 30d supply, fill #0

## 2023-09-02 ENCOUNTER — Other Ambulatory Visit (HOSPITAL_COMMUNITY): Payer: Self-pay

## 2023-09-03 DIAGNOSIS — M7711 Lateral epicondylitis, right elbow: Secondary | ICD-10-CM | POA: Diagnosis not present

## 2023-09-03 DIAGNOSIS — L732 Hidradenitis suppurativa: Secondary | ICD-10-CM | POA: Diagnosis not present

## 2023-09-03 DIAGNOSIS — M461 Sacroiliitis, not elsewhere classified: Secondary | ICD-10-CM | POA: Diagnosis not present

## 2023-09-03 DIAGNOSIS — L409 Psoriasis, unspecified: Secondary | ICD-10-CM | POA: Diagnosis not present

## 2023-09-03 DIAGNOSIS — Z79899 Other long term (current) drug therapy: Secondary | ICD-10-CM | POA: Diagnosis not present

## 2023-09-03 DIAGNOSIS — M7712 Lateral epicondylitis, left elbow: Secondary | ICD-10-CM | POA: Diagnosis not present

## 2023-09-03 DIAGNOSIS — M654 Radial styloid tenosynovitis [de Quervain]: Secondary | ICD-10-CM | POA: Diagnosis not present

## 2023-09-03 DIAGNOSIS — M0579 Rheumatoid arthritis with rheumatoid factor of multiple sites without organ or systems involvement: Secondary | ICD-10-CM | POA: Diagnosis not present

## 2023-09-03 DIAGNOSIS — Z6839 Body mass index (BMI) 39.0-39.9, adult: Secondary | ICD-10-CM | POA: Diagnosis not present

## 2023-09-03 DIAGNOSIS — E669 Obesity, unspecified: Secondary | ICD-10-CM | POA: Diagnosis not present

## 2023-09-04 LAB — BASIC METABOLIC PANEL
BUN: 11 (ref 4–21)
CO2: 21 (ref 13–22)
Chloride: 104 (ref 99–108)
Creatinine: 0.7 (ref 0.5–1.1)
Glucose: 108
Potassium: 4.1 meq/L (ref 3.5–5.1)
Sodium: 139 (ref 137–147)

## 2023-09-04 LAB — COMPREHENSIVE METABOLIC PANEL
Albumin: 3.8 (ref 3.5–5.0)
Calcium: 9.7 (ref 8.7–10.7)
eGFR: 106

## 2023-09-04 LAB — CBC AND DIFFERENTIAL
HCT: 40 (ref 36–46)
Hemoglobin: 13.2 (ref 12.0–16.0)
Platelets: 426 10*3/uL — AB (ref 150–400)
WBC: 6.5

## 2023-09-04 LAB — HEPATIC FUNCTION PANEL
ALT: 18 U/L (ref 7–35)
AST: 17 (ref 13–35)
Alkaline Phosphatase: 70 (ref 25–125)
Bilirubin, Total: 0.2

## 2023-09-04 LAB — CBC: RBC: 4.42 (ref 3.87–5.11)

## 2023-09-07 ENCOUNTER — Other Ambulatory Visit (HOSPITAL_COMMUNITY): Payer: Self-pay

## 2023-09-07 ENCOUNTER — Other Ambulatory Visit: Payer: Self-pay | Admitting: Internal Medicine

## 2023-09-07 MED ORDER — ESCITALOPRAM OXALATE 20 MG PO TABS
20.0000 mg | ORAL_TABLET | Freq: Every day | ORAL | 0 refills | Status: DC
  Filled 2023-09-07 – 2023-09-10 (×2): qty 90, 90d supply, fill #0

## 2023-09-07 MED ORDER — ESOMEPRAZOLE MAGNESIUM 40 MG PO CPDR
40.0000 mg | DELAYED_RELEASE_CAPSULE | Freq: Every day | ORAL | 0 refills | Status: DC
  Filled 2023-09-07 – 2023-09-10 (×2): qty 90, 90d supply, fill #0

## 2023-09-08 ENCOUNTER — Other Ambulatory Visit: Payer: Self-pay

## 2023-09-08 ENCOUNTER — Other Ambulatory Visit (HOSPITAL_COMMUNITY): Payer: Self-pay

## 2023-09-10 ENCOUNTER — Other Ambulatory Visit (HOSPITAL_COMMUNITY): Payer: Self-pay

## 2023-09-10 ENCOUNTER — Other Ambulatory Visit: Payer: Self-pay

## 2023-09-12 ENCOUNTER — Other Ambulatory Visit: Payer: Self-pay

## 2023-09-12 NOTE — Progress Notes (Signed)
Specialty Pharmacy Refill Coordination Note  April [name] is a 47 y.o. female contacted today regarding refills of specialty medication(s) Certolizumab Pegol   Patient requested Delivery   Delivery date: 09/18/23   Verified address: [address]  [facility] [place] [address]   Medication will be filled on 09/17/23.

## 2023-09-18 NOTE — Progress Notes (Addendum)
[name] [address] [place] [address] Phone: [phone] Subjective:   [name], am serving as a scribe for Dr. Antoine Primas.  I'm seeing this patient by the request  of:  Etta Grandchild, MD  CC: back and neck follow up   [id number]  April [name] is a 47 y.o. female coming in with complaint of back and neck pain. OMT on 06/04/2023. Patient states L wrist and R [place] joint causing some discomfort. Think its arthritis. No other concerns.  Medications patient has been prescribed:   Taking:         Reviewed prior external information including notes and imaging from previsou exam, outside providers and external EMR if available.   As well as notes that were available from care everywhere and other healthcare systems.  Past medical history, social, surgical and family history all reviewed in electronic medical record.  No pertanent information unless stated regarding to the chief complaint.   Past Medical History:  Diagnosis Date   ALLERGIC RHINITIS    Anal fissure    Anxiety    ANXIETY DEPRESSION    Back pain    CONSTIPATION    Constipation    DEPRESSION    Esophageal reflux    Food allergy    Avacados   Gastritis    GERD (gastroesophageal reflux disease)    HYPERCHOLESTEROLEMIA    Hyperlipidemia    Hypertension    OBESITY    Occipital neuralgia    Palpitations    Rheumatoid arthritis (HCC) 12/24/2017    Allergies  Allergen Reactions   Ciprofloxacin     toxicity   Levaquin [Levofloxacin] Anxiety    Possible CNS side effects. TOXICITY   Moxifloxacin     TOXICITY   Avocado     Other reaction(s): Unknown     Review of Systems:  No headache, visual changes, nausea, vomiting, diarrhea, constipation, dizziness, abdominal pain, skin rash, fevers, chills, night sweats, weight loss, swollen lymph nodes, body aches, joint swelling, chest pain, shortness of breath, mood changes. POSITIVE muscle  aches  Objective  Blood pressure 134/72, pulse 96, height 5\' 10"  (1.778 m), weight 269 lb (122 kg), SpO2 98%.   General: No apparent distress alert and oriented x3 mood and affect normal, dressed appropriately.  HEENT: Pupils equal, extraocular movements intact  Respiratory: Patient's speak in full sentences and does not appear short of breath  Cardiovascular: No lower extremity edema, non tender, no erythema  MSK:  Back does have some tightness noted.  Patient does send positive Pearlean Brownie on the left side. Patient's left wrist does have positive Finkelstein's noted.  Osteopathic findings  C2 flexed rotated and side bent right C6 flexed rotated and side bent left T3 extended rotated and side bent right inhaled rib T7 extended rotated and side bent left L1 flexed rotated and side bent right L 3 FRS left  Sacrum right on right    Assessment and Plan:  SI (sacroiliac) joint dysfunction Sacroiliac dysfunction noted.  Discussed icing regimen and home exercises, discussed which activities to do and which ones to avoid.  Increase activity slowly.  I am a little concerned the patient is having more of an exacerbation of the underlying rheumatoid arthritis and will need to monitor as well.  Patient is going to talk to her rheumatologist about potentially changing medication.  If patient does have a flare can consider prednisone and prescription given today.    Nonallopathic problems  Decision today  to treat with OMT was based on Physical Exam  After verbal consent patient was treated with HVLA, ME, FPR techniques in cervical, rib, thoracic, lumbar, and sacral  areas  Patient tolerated the procedure well with improvement in symptoms  Patient given exercises, stretches and lifestyle modifications  See medications in patient instructions if given  Patient will follow up in 4-8 weeks     The above documentation has been reviewed and is accurate and complete Judi Saa, DO          Note: This dictation was prepared with Dragon dictation along with smaller phrase technology. Any transcriptional errors that result from this process are unintentional.

## 2023-09-19 ENCOUNTER — Ambulatory Visit: Payer: Commercial Managed Care - PPO | Admitting: Family Medicine

## 2023-09-23 ENCOUNTER — Other Ambulatory Visit (HOSPITAL_COMMUNITY): Payer: Self-pay

## 2023-09-23 ENCOUNTER — Ambulatory Visit: Payer: Commercial Managed Care - PPO | Admitting: Family Medicine

## 2023-09-23 ENCOUNTER — Other Ambulatory Visit: Payer: Self-pay

## 2023-09-23 ENCOUNTER — Encounter: Payer: Self-pay | Admitting: Family Medicine

## 2023-09-23 VITALS — BP 134/72 | HR 96 | Ht 70.0 in | Wt 269.0 lb

## 2023-09-23 DIAGNOSIS — M9903 Segmental and somatic dysfunction of lumbar region: Secondary | ICD-10-CM | POA: Diagnosis not present

## 2023-09-23 DIAGNOSIS — M9908 Segmental and somatic dysfunction of rib cage: Secondary | ICD-10-CM

## 2023-09-23 DIAGNOSIS — M9901 Segmental and somatic dysfunction of cervical region: Secondary | ICD-10-CM

## 2023-09-23 DIAGNOSIS — M9904 Segmental and somatic dysfunction of sacral region: Secondary | ICD-10-CM | POA: Diagnosis not present

## 2023-09-23 DIAGNOSIS — M533 Sacrococcygeal disorders, not elsewhere classified: Secondary | ICD-10-CM | POA: Diagnosis not present

## 2023-09-23 DIAGNOSIS — M25532 Pain in left wrist: Secondary | ICD-10-CM | POA: Insufficient documentation

## 2023-09-23 DIAGNOSIS — M9902 Segmental and somatic dysfunction of thoracic region: Secondary | ICD-10-CM

## 2023-09-23 MED ORDER — PREDNISONE 20 MG PO TABS
40.0000 mg | ORAL_TABLET | Freq: Every day | ORAL | 0 refills | Status: DC
  Filled 2023-09-23 – 2023-09-24 (×3): qty 10, 5d supply, fill #0

## 2023-09-23 NOTE — Assessment & Plan Note (Signed)
Left wrist does have pain over the abductor pollicis longus tendon sheath.  As thumb spica brace given today.

## 2023-09-23 NOTE — Patient Instructions (Signed)
You have 14 days to return or exchange your brace Call [phone], then return the brace to our office Brace day and night for 2 weeks Then only at night for another 2 weeks Prednisone 40mg  5 days See you again in 6-8 weeks

## 2023-09-23 NOTE — Assessment & Plan Note (Signed)
Sacroiliac dysfunction noted.  Discussed icing regimen and home exercises, discussed which activities to do and which ones to avoid.  Increase activity slowly.  I am a little concerned the patient is having more of an exacerbation of the underlying rheumatoid arthritis and will need to monitor as well.  Patient is going to talk to her rheumatologist about potentially changing medication.  If patient does have a flare can consider prednisone and prescription given today.

## 2023-09-24 ENCOUNTER — Other Ambulatory Visit (HOSPITAL_COMMUNITY): Payer: Self-pay

## 2023-09-24 ENCOUNTER — Encounter: Payer: Self-pay | Admitting: Pharmacist

## 2023-09-24 ENCOUNTER — Other Ambulatory Visit: Payer: Self-pay

## 2023-09-26 ENCOUNTER — Other Ambulatory Visit (HOSPITAL_COMMUNITY): Payer: Self-pay

## 2023-10-04 ENCOUNTER — Other Ambulatory Visit: Payer: Self-pay | Admitting: Internal Medicine

## 2023-10-04 ENCOUNTER — Other Ambulatory Visit (HOSPITAL_COMMUNITY): Payer: Self-pay

## 2023-10-04 DIAGNOSIS — I10 Essential (primary) hypertension: Secondary | ICD-10-CM

## 2023-10-04 MED ORDER — FENOFIBRATE 160 MG PO TABS
160.0000 mg | ORAL_TABLET | Freq: Every day | ORAL | 0 refills | Status: DC
  Filled 2023-10-04: qty 90, 90d supply, fill #0

## 2023-10-04 MED ORDER — LOSARTAN POTASSIUM 50 MG PO TABS
50.0000 mg | ORAL_TABLET | Freq: Every day | ORAL | 0 refills | Status: DC
Start: 2023-10-04 — End: 2024-01-01
  Filled 2023-10-04: qty 90, 90d supply, fill #0

## 2023-10-06 ENCOUNTER — Other Ambulatory Visit: Payer: Self-pay

## 2023-10-06 ENCOUNTER — Other Ambulatory Visit (HOSPITAL_COMMUNITY): Payer: Self-pay

## 2023-10-07 ENCOUNTER — Other Ambulatory Visit: Payer: Self-pay

## 2023-10-07 NOTE — Progress Notes (Signed)
Specialty Pharmacy Refill Coordination Note  Talisha ARDELLA NABARRO is a 47 y.o. female contacted today regarding refills of specialty medication(s) Certolizumab Pegol   Patient requested Delivery   Delivery date: 10/21/23   Verified address: [address]  [facility] [place] [address]   Medication will be filled on 10/20/23 for 10/27/23 injection.

## 2023-10-20 ENCOUNTER — Other Ambulatory Visit: Payer: Self-pay

## 2023-11-05 NOTE — Progress Notes (Unsigned)
[name] [address] [place] [address] Phone: [phone] Subjective:   [name], am serving as a scribe for Dr. Antoine Primas.  I'm seeing this patient by the request  of:  Etta Grandchild, MD  CC: Low back and neck pain  [id number]  April [name] is a 47 y.o. female coming in with complaint of back and neck pain. OMT 09/23/2023. Patient states same per usual. Brace may be helping, but wrist not improved. No other concerns.  Medications patient has been prescribed: Prednisone  Taking: No longer         Reviewed prior external information including notes and imaging from previsou exam, outside providers and external EMR if available.   As well as notes that were available from care everywhere and other healthcare systems.  Past medical history, social, surgical and family history all reviewed in electronic medical record.  No pertanent information unless stated regarding to the chief complaint.   Past Medical History:  Diagnosis Date   ALLERGIC RHINITIS    Anal fissure    Anxiety    ANXIETY DEPRESSION    Back pain    CONSTIPATION    Constipation    DEPRESSION    Esophageal reflux    Food allergy    Avacados   Gastritis    GERD (gastroesophageal reflux disease)    HYPERCHOLESTEROLEMIA    Hyperlipidemia    Hypertension    OBESITY    Occipital neuralgia    Palpitations    Rheumatoid arthritis (HCC) 12/24/2017    Allergies  Allergen Reactions   Ciprofloxacin     toxicity   Levaquin [Levofloxacin] Anxiety    Possible CNS side effects. TOXICITY   Moxifloxacin     TOXICITY   Avocado     Other reaction(s): Unknown     Review of Systems:  No headache, visual changes, nausea, vomiting, diarrhea, constipation, dizziness, abdominal pain, skin rash, fevers, chills, night sweats, weight loss, swollen lymph nodes, body aches, joint swelling, chest pain, shortness of breath, mood changes. POSITIVE muscle  aches  Objective  Blood pressure 126/72, pulse 100, height 5\' 10"  (1.778 m), weight 271 lb (122.9 kg), SpO2 97%.   General: No apparent distress alert and oriented x3 mood and affect normal, dressed appropriately.  HEENT: Pupils equal, extraocular movements intact  Respiratory: Patient's speak in full sentences and does not appear short of breath  Cardiovascular: No lower extremity edema, non tender, no erythema  Left wrist does show the patient does have a positive Finkelstein's.  Negative CMC joint pain.  Good grip strength otherwise.  Low back does have some loss of lordosis.  Does have some tightness in the neck with sidebending bilaterally.  Procedure: Real-time Ultrasound Guided Injection of left Abductor pollicis longs tendon sheath Device: GE Logiq Q7  Ultrasound guided injection is preferred based studies that show increased duration, increased effect, greater accuracy, decreased procedural pain, increased response rate with ultrasound guided versus blind injection.  Verbal informed consent obtained.  Time-out conducted.  Noted no overlying erythema, induration, or other signs of local infection.  Skin prepped in a sterile fashion.  Local anesthesia: Topical Ethyl chloride.  With sterile technique and under real time ultrasound guidance:  tendon visualized.  23g 5/8 inch needle inserted distal to proximal approach into tendon sheath. Pictures taken  for needle placement. Patient did have injection of 0.5 cc of of 0.5% Marcaine, and 0.5 cc of Kenalog 40 mg/dL. Completed without difficulty  Pain immediately resolved suggesting accurate placement of the medication.  Advised to call if fevers/chills, erythema, induration, drainage, or persistent bleeding.  Impression: Technically successful ultrasound guided injection.  Osteopathic findings C6 flexed rotated and side bent left T3 extended rotated and side bent right inhaled rib T9 extended rotated and side bent left L2 flexed  rotated and side bent right Sacrum right on right       Assessment and Plan:  De Quervain's tenosynovitis, left Injection given, tolerated the procedure well, discussed icing regimen and home exercises, discussed which activities to do and which ones to avoid.  Follow-up again in 6 to 8 weeks do good bracing.    Nonallopathic problems  Decision today to treat with OMT was based on Physical Exam  After verbal consent patient was treated with HVLA, ME, FPR techniques in cervical, rib, thoracic, lumbar, and sacral  areas  Patient tolerated the procedure well with improvement in symptoms  Patient given exercises, stretches and lifestyle modifications  See medications in patient instructions if given  Patient will follow up in 4-8 weeks     The above documentation has been reviewed and is accurate and complete Judi Saa, DO         Note: This dictation was prepared with Dragon dictation along with smaller phrase technology. Any transcriptional errors that result from this process are unintentional.

## 2023-11-06 ENCOUNTER — Other Ambulatory Visit: Payer: Self-pay

## 2023-11-06 ENCOUNTER — Other Ambulatory Visit (HOSPITAL_COMMUNITY): Payer: Self-pay

## 2023-11-06 ENCOUNTER — Ambulatory Visit: Payer: Commercial Managed Care - PPO | Admitting: Family Medicine

## 2023-11-06 ENCOUNTER — Encounter: Payer: Self-pay | Admitting: Family Medicine

## 2023-11-06 VITALS — BP 126/72 | HR 100 | Ht 70.0 in | Wt 271.0 lb

## 2023-11-06 DIAGNOSIS — M25532 Pain in left wrist: Secondary | ICD-10-CM

## 2023-11-06 DIAGNOSIS — M9904 Segmental and somatic dysfunction of sacral region: Secondary | ICD-10-CM | POA: Diagnosis not present

## 2023-11-06 DIAGNOSIS — M5442 Lumbago with sciatica, left side: Secondary | ICD-10-CM

## 2023-11-06 DIAGNOSIS — M9902 Segmental and somatic dysfunction of thoracic region: Secondary | ICD-10-CM

## 2023-11-06 DIAGNOSIS — M9903 Segmental and somatic dysfunction of lumbar region: Secondary | ICD-10-CM

## 2023-11-06 DIAGNOSIS — M654 Radial styloid tenosynovitis [de Quervain]: Secondary | ICD-10-CM | POA: Diagnosis not present

## 2023-11-06 DIAGNOSIS — M9901 Segmental and somatic dysfunction of cervical region: Secondary | ICD-10-CM

## 2023-11-06 DIAGNOSIS — M5441 Lumbago with sciatica, right side: Secondary | ICD-10-CM

## 2023-11-06 DIAGNOSIS — M9908 Segmental and somatic dysfunction of rib cage: Secondary | ICD-10-CM

## 2023-11-06 MED ORDER — METHOTREXATE SODIUM CHEMO INJECTION 50 MG/2ML
25.0000 mg | INTRAMUSCULAR | 0 refills | Status: DC
  Filled 2023-11-06 – 2023-11-07 (×2): qty 12, 84d supply, fill #0

## 2023-11-06 NOTE — Assessment & Plan Note (Signed)
Chronic problem with the lower back.  Discussed icing regimen and home exercises.  Discussed posture and ergonomics, discussed avoiding certain activities.  Follow-up again in 6 to 8 weeks.

## 2023-11-06 NOTE — Patient Instructions (Addendum)
Injection in wrist today Brace day and night for a week Happy Holidays Good to see you! See you again in 6-8 weeks

## 2023-11-06 NOTE — Assessment & Plan Note (Signed)
Injection given, tolerated the procedure well, discussed icing regimen and home exercises, discussed which activities to do and which ones to avoid.  Follow-up again in 6 to 8 weeks do good bracing.

## 2023-11-07 ENCOUNTER — Ambulatory Visit (INDEPENDENT_AMBULATORY_CARE_PROVIDER_SITE_OTHER): Payer: Commercial Managed Care - PPO | Admitting: Obstetrics and Gynecology

## 2023-11-07 ENCOUNTER — Encounter: Payer: Self-pay | Admitting: Obstetrics and Gynecology

## 2023-11-07 ENCOUNTER — Other Ambulatory Visit (HOSPITAL_COMMUNITY)
Admission: RE | Admit: 2023-11-07 | Discharge: 2023-11-07 | Disposition: A | Discharge status: Home / Self Care | Payer: Commercial Managed Care - PPO | Source: Ambulatory Visit | Attending: Obstetrics and Gynecology | Admitting: Obstetrics and Gynecology

## 2023-11-07 ENCOUNTER — Other Ambulatory Visit: Payer: Self-pay

## 2023-11-07 ENCOUNTER — Other Ambulatory Visit (HOSPITAL_COMMUNITY): Payer: Self-pay

## 2023-11-07 VITALS — BP 136/86 | HR 81 | Ht 71.25 in | Wt 271.0 lb

## 2023-11-07 DIAGNOSIS — Z01419 Encounter for gynecological examination (general) (routine) without abnormal findings: Secondary | ICD-10-CM

## 2023-11-07 MED ORDER — ETONOGESTREL-ETHINYL ESTRADIOL 0.12-0.015 MG/24HR VA RING
1.0000 | VAGINAL_RING | VAGINAL | 4 refills | Status: DC
  Filled 2023-11-07: qty 3, fill #0
  Filled 2023-12-02: qty 3, 84d supply, fill #0
  Filled 2023-12-03: qty 3, 28d supply, fill #0
  Filled 2024-02-15: qty 3, 28d supply, fill #1
  Filled 2024-03-28: qty 3, 28d supply, fill #2
  Filled 2024-07-29: qty 3, 28d supply, fill #3
  Filled 2024-08-25: qty 3, 28d supply, fill #4

## 2023-11-07 NOTE — Progress Notes (Signed)
47 y.o. y.o. female here for annual exam. No LMP recorded. (Menstrual status: Other).     HPI: Well on Nuvaring continuous use.  No BTB.  No pelvic pain.  Frequent sebaceous gland cysts/abcesses at vulva/upper inner legs and breast/axilla (Hidradenitis suppurativa) and Psoriasis followed by Dermato.  No pain with IC. Pap Neg 10/2020.  No previous abnormal Pap.  Will repeat Pap at 3 years.  Breasts wnl.  Screening mammo Neg 1/22024 birads 1.  BMI 33.05 last visit. Marland Kitchen  Health labs with Fam MD.  Anxiety/Depression stable on Lexapro.  Colonoscopy 03/28/23 normal repeat in 10 years.  Body mass index is 37.53 kg/m.     02/11/2023   11:11 AM 11/29/2022    9:04 AM 12/05/2021   10:11 AM  Depression screen PHQ 2/9  Decreased Interest 0 0 0  Down, Depressed, Hopeless 0 0 0  PHQ - 2 Score 0 0 0  Altered sleeping 0 0 0  Tired, decreased energy 0 0 0  Change in appetite 0 0 0  Feeling bad or failure about yourself  0 0 0  Trouble concentrating 0 0 0  Moving slowly or fidgety/restless 0 0 0  Suicidal thoughts 0 0 0  PHQ-9 Score 0 0 0  Difficult doing work/chores Not difficult at all Not difficult at all     Height 5' 11.25" (1.81 m), weight 271 lb (122.9 kg).  No results found for: "DIAGPAP", "HPVHIGH", "ADEQPAP"  GYN HISTORY: No results found for: "DIAGPAP", "HPVHIGH", "ADEQPAP"  OB History  Gravida Para Term Preterm AB Living  0 0 0 0 0 0  SAB IAB Ectopic Multiple Live Births  0 0 0 0 0    Past Medical History:  Diagnosis Date   ALLERGIC RHINITIS    Anal fissure    Anxiety    ANXIETY DEPRESSION    Back pain    CONSTIPATION    Constipation    DEPRESSION    Esophageal reflux    Food allergy    Avacados   Gastritis    GERD (gastroesophageal reflux disease)    HYPERCHOLESTEROLEMIA    Hyperlipidemia    Hypertension    OBESITY    Occipital neuralgia    Palpitations    Rheumatoid arthritis (HCC) 12/24/2017    Past Surgical History:  Procedure Laterality Date   NO  PAST SURGERIES      Current Outpatient Medications on File Prior to Visit  Medication Sig Dispense Refill   ascorbic acid (VITAMIN C) 1000 MG tablet Take 1,000 mg by mouth daily.     busPIRone (BUSPAR) 15 MG tablet Take 1 tablet (15 mg total) by mouth 2 (two) times daily. (Patient taking differently: Take 15 mg by mouth. Bid as needed) 180 tablet 1   celecoxib (CELEBREX) 200 MG capsule Take 1 capsule (200 mg total) by mouth 2 (two) times daily as needed with food 180 capsule 1   certolizumab pegol (CIMZIA, 2 SYRINGE,) 200 MG/ML prefilled syringe Use 1 injection subcutaneously every other week 1 each 4   cetirizine (ZYRTEC) 10 MG tablet Take 1 tablet (10 mg total) by mouth daily. 30 tablet 3   Cholecalciferol (VITAMIN D) 50 MCG (2000 UT) CAPS Take 1 capsule by mouth daily.     clobetasol ointment (TEMOVATE) 0.05 % as needed.  0   escitalopram (LEXAPRO) 20 MG tablet Take 1 tablet (20 mg total) by mouth daily. 90 tablet 0   esomeprazole (NEXIUM) 40 MG capsule Take 1 capsule (40 mg  total) by mouth daily. 90 capsule 0   etonogestrel-ethinyl estradiol (NUVARING) 0.12-0.015 MG/24HR vaginal ring REMOVE AND INSERT A NEW RING INTO THE VAGINA EVERY 4 WEEKS *USE CONTINUOUSLY* 3 each 4   famotidine (PEPCID) 20 MG tablet Take 20 mg by mouth at bedtime. Take 1 tablets by mouth daily at bedtime     fenofibrate 160 MG tablet Take 1 tablet (160 mg total) by mouth daily. 90 tablet 0   folic acid (FOLVITE) 1 MG tablet Take 1 tablet (1 mg total) by mouth daily. 90 tablet 3   ibuprofen (ADVIL) 800 MG tablet Take 1 tablet (800 mg total) by mouth every 8 (eight) hours as needed. 90 tablet 0   losartan (COZAAR) 50 MG tablet Take 1 tablet (50 mg total) by mouth daily. 90 tablet 0   MAGNESIUM MALATE PO Take 1 tablet by mouth at bedtime.     methotrexate 50 MG/2ML injection Inject 1 mL (25 mg total) into the skin every 7 (seven) days. 12 mL 0   montelukast (SINGULAIR) 10 MG tablet Take 1 tablet (10 mg total) by mouth at  bedtime. 90 tablet 1   Multiple Vitamin (MULTIVITAMIN) tablet Take 1 tablet by mouth daily.     omega-3 acid ethyl esters (LOVAZA) 1 g capsule Take 2 capsules (2 g total) by mouth 2 (two) times daily. 360 capsule 0   predniSONE (DELTASONE) 20 MG tablet Take 2 tablets (40 mg total) by mouth daily with breakfast. 10 tablet 0   Sod Fluoride-Potassium Nitrate (SODIUM FLUORIDE 5000 SENSITIVE) 1.1-5 % GEL Apply a pea sized amount on tooth brush and brush for 2 minutes and spit excess. Do not rinse with water after that. 100 mL 6   zolpidem (AMBIEN) 5 MG tablet Take 1 tablet (5 mg total) by mouth at bedtime as needed for sleep. 90 tablet 1   No current facility-administered medications on file prior to visit.    Social History   Socioeconomic History   Marital status: Married    Spouse name: Not on file   Number of children: 0   Years of education: Not on file   Highest education level: Not on file  Occupational History   Occupation: Pharmacist  Tobacco Use   Smoking status: Never    Passive exposure: Never   Smokeless tobacco: Never   Tobacco comments:    Married, lives with spouse  Vaping Use   Vaping status: Never Used  Substance and Sexual Activity   Alcohol use: No   Drug use: No   Sexual activity: Yes    Partners: Male    Birth control/protection: Inserts    Comment: 1ST INTERCOURSE- 21, PARTNERS- 3   Other Topics Concern   Not on file  Social History Narrative   Not on file   Social Drivers of Health   Financial Resource Strain: Not on file  Food Insecurity: Not on file  Transportation Needs: Not on file  Physical Activity: Not on file  Stress: Not on file  Social Connections: Not on file  Intimate Partner Violence: Not on file    Family History  Problem Relation Age of Onset   Cancer Mother    Hypertension Mother    Diabetes Mother    Hyperlipidemia Mother    Breast cancer Mother 65   Osteoarthritis Mother    Colon polyps Mother    Anxiety disorder Mother     Sleep apnea Mother    Eating disorder Mother    Obesity Mother  Heart failure Father    Hyperlipidemia Father    Coronary artery disease Father        MI age 47   Prostate cancer Father    Colon polyps Father    Bladder Cancer Father    Cancer - Prostate Father    Macular degeneration Father    Hyperlipidemia Brother    Stomach cancer Paternal Grandmother    Colon cancer Neg Hx    Liver disease Neg Hx    Esophageal cancer Neg Hx      Allergies  Allergen Reactions   Ciprofloxacin     toxicity   Levaquin [Levofloxacin] Anxiety    Possible CNS side effects. TOXICITY   Moxifloxacin     TOXICITY   Avocado     Other reaction(s): Unknown      Patient's last menstrual period was No LMP recorded. (Menstrual status: Other)..             Review of Systems Alls systems reviewed and are negative.     Physical Exam Constitutional:      Appearance: Normal appearance.  Genitourinary:     Vulva and urethral meatus normal.     No lesions in the vagina.     Right Labia: No rash, lesions or skin changes.    Left Labia: No lesions, skin changes or rash.    No vaginal discharge or tenderness.     No vaginal prolapse present.    No vaginal atrophy present.     Right Adnexa: not tender, not palpable and no mass present.    Left Adnexa: not tender, not palpable and no mass present.    No cervical motion tenderness or discharge.     Uterus is not enlarged, tender or irregular.  Breasts:    Right: Normal.     Left: Normal.  HENT:     Head: Normocephalic.  Neck:     Thyroid: No thyroid mass, thyromegaly or thyroid tenderness.  Cardiovascular:     Rate and Rhythm: Normal rate and regular rhythm.     Heart sounds: Normal heart sounds, S1 normal and S2 normal.  Pulmonary:     Effort: Pulmonary effort is normal.     Breath sounds: Normal breath sounds and air entry.  Abdominal:     General: There is no distension.     Palpations: Abdomen is soft. There is no mass.      Tenderness: There is no abdominal tenderness. There is no guarding or rebound.  Musculoskeletal:        General: Normal range of motion.     Cervical back: Full passive range of motion without pain, normal range of motion and neck supple. No tenderness.     Right lower leg: No edema.     Left lower leg: No edema.  Neurological:     Mental Status: She is alert.  Skin:    General: Skin is warm.  Psychiatric:        Mood and Affect: Mood normal.        Behavior: Behavior normal.        Thought Content: Thought content normal.  Vitals and nursing note reviewed. Exam conducted with a chaperone present.       A:         Well Woman GYN exam                             P:  Pap smear collected today Encouraged annual mammogram screening Colon cancer screening up-to-date DXA not indicated Labs and immunizations to do with PMD Encouraged healthy lifestyle practices Encouraged Vit D and Calcium  Nuva ring refilled for patient  No follow-ups on file.  Earley Favor

## 2023-11-07 NOTE — Addendum Note (Signed)
Addended by: Earley Favor on: 11/07/2023 01:37 PM   Modules accepted: Orders

## 2023-11-11 ENCOUNTER — Other Ambulatory Visit: Payer: Self-pay

## 2023-11-11 ENCOUNTER — Other Ambulatory Visit: Payer: Self-pay | Admitting: Internal Medicine

## 2023-11-11 ENCOUNTER — Encounter (HOSPITAL_COMMUNITY): Payer: Self-pay

## 2023-11-11 LAB — CYTOLOGY - PAP
Comment: NEGATIVE
Diagnosis: NEGATIVE
High risk HPV: NEGATIVE

## 2023-11-11 NOTE — Progress Notes (Signed)
Specialty Pharmacy Refill Coordination Note  April [name] is a 47 y.o. female contacted today regarding refills of specialty medication(s) Certolizumab Pegol (Cimzia (2 Syringe))   Patient requested Delivery   Delivery date: 11/21/23   Verified address: [address]   Medication will be filled on 11/20/23. Pending Refill Request.

## 2023-11-11 NOTE — Progress Notes (Signed)
Specialty Pharmacy Ongoing Clinical Assessment Note  April [name] is a 47 y.o. female who is being followed by the specialty pharmacy service for RxSp Rheumatoid Arthritis   Patient's specialty medication(s) reviewed today: Certolizumab Pegol (Cimzia (2 Syringe))   Missed doses in the last 4 weeks: No data recorded  Patient/Caregiver did not have any additional questions or concerns.   Therapeutic benefit summary: Patient is achieving benefit   Adverse events/side effects summary: No adverse events/side effects   Patient's therapy is appropriate to: Continue    Goals Addressed             This Visit's Progress    Stabilization of disease       Patient is on track. Patient will maintain adherence         Follow up:  6 months  Bobette Mo Specialty Pharmacist

## 2023-11-17 ENCOUNTER — Other Ambulatory Visit: Payer: Self-pay | Admitting: Internal Medicine

## 2023-11-17 ENCOUNTER — Other Ambulatory Visit: Payer: Self-pay

## 2023-11-17 DIAGNOSIS — H5213 Myopia, bilateral: Secondary | ICD-10-CM | POA: Diagnosis not present

## 2023-11-17 DIAGNOSIS — H52223 Regular astigmatism, bilateral: Secondary | ICD-10-CM | POA: Diagnosis not present

## 2023-11-17 DIAGNOSIS — H524 Presbyopia: Secondary | ICD-10-CM | POA: Diagnosis not present

## 2023-11-19 ENCOUNTER — Telehealth: Payer: Commercial Managed Care - PPO | Admitting: Family Medicine

## 2023-11-19 DIAGNOSIS — J019 Acute sinusitis, unspecified: Secondary | ICD-10-CM

## 2023-11-19 MED ORDER — AMOXICILLIN-POT CLAVULANATE 875-125 MG PO TABS: 1.0000 | ORAL_TABLET | Freq: Two times a day (BID) | ORAL | 0 refills | Status: AC

## 2023-11-19 NOTE — Progress Notes (Signed)

## 2023-11-20 ENCOUNTER — Encounter: Payer: Self-pay | Admitting: Internal Medicine

## 2023-11-20 ENCOUNTER — Ambulatory Visit (INDEPENDENT_AMBULATORY_CARE_PROVIDER_SITE_OTHER): Payer: Commercial Managed Care - PPO | Admitting: Internal Medicine

## 2023-11-20 ENCOUNTER — Other Ambulatory Visit (HOSPITAL_COMMUNITY): Payer: Self-pay

## 2023-11-20 VITALS — BP 126/82 | HR 95 | Temp 98.2°F | Resp 16 | Ht 71.25 in | Wt 267.8 lb

## 2023-11-20 DIAGNOSIS — R002 Palpitations: Secondary | ICD-10-CM | POA: Diagnosis not present

## 2023-11-20 DIAGNOSIS — R7303 Prediabetes: Secondary | ICD-10-CM

## 2023-11-20 DIAGNOSIS — Z0000 Encounter for general adult medical examination without abnormal findings: Secondary | ICD-10-CM

## 2023-11-20 DIAGNOSIS — R251 Tremor, unspecified: Secondary | ICD-10-CM | POA: Insufficient documentation

## 2023-11-20 DIAGNOSIS — I10 Essential (primary) hypertension: Secondary | ICD-10-CM | POA: Diagnosis not present

## 2023-11-20 DIAGNOSIS — F5104 Psychophysiologic insomnia: Secondary | ICD-10-CM | POA: Diagnosis not present

## 2023-11-20 DIAGNOSIS — E559 Vitamin D deficiency, unspecified: Secondary | ICD-10-CM | POA: Diagnosis not present

## 2023-11-20 DIAGNOSIS — Z0001 Encounter for general adult medical examination with abnormal findings: Secondary | ICD-10-CM

## 2023-11-20 DIAGNOSIS — M722 Plantar fascial fibromatosis: Secondary | ICD-10-CM | POA: Insufficient documentation

## 2023-11-20 LAB — URINALYSIS, ROUTINE W REFLEX MICROSCOPIC
Bilirubin Urine: NEGATIVE
Hgb urine dipstick: NEGATIVE
Ketones, ur: NEGATIVE
Nitrite: NEGATIVE
Specific Gravity, Urine: 1.025 (ref 1.000–1.030)
Total Protein, Urine: NEGATIVE
Urine Glucose: NEGATIVE
Urobilinogen, UA: 0.2 (ref 0.0–1.0)
pH: 6 (ref 5.0–8.0)

## 2023-11-20 LAB — LIPID PANEL
Cholesterol: 230 mg/dL — ABNORMAL HIGH (ref 0–200)
HDL: 79.6 mg/dL (ref >39.00)
LDL Cholesterol: 124 mg/dL — ABNORMAL HIGH (ref 0–99)
NonHDL: 150.19
Total CHOL/HDL Ratio: 3
Triglycerides: 129 mg/dL (ref 0.0–149.0)
VLDL: 25.8 mg/dL (ref 0.0–40.0)

## 2023-11-20 LAB — HEMOGLOBIN A1C: Hgb A1c MFr Bld: 5.8 % (ref 4.6–6.5)

## 2023-11-20 LAB — VITAMIN D 25 HYDROXY (VIT D DEFICIENCY, FRACTURES): VITD: 48.76 ng/mL (ref 30.00–100.00)

## 2023-11-20 LAB — TSH: TSH: 1.71 u[IU]/mL (ref 0.35–5.50)

## 2023-11-20 MED ORDER — ZOLPIDEM TARTRATE 5 MG PO TABS
5.0000 mg | ORAL_TABLET | Freq: Every evening | ORAL | 0 refills | Status: DC | PRN
  Filled 2023-11-20: qty 30, 30d supply, fill #0

## 2023-11-20 NOTE — Patient Instructions (Signed)

## 2023-11-20 NOTE — Progress Notes (Signed)
 Subjective:  [name], female    DOB: 29-Dec-1975  Age: 48 y.o. MRN: [id number]  CC: Annual Exam, Hypertension, and Hyperlipidemia   HPI April [name] presents for a CPX and f/up ----  Discussed the use of AI scribe software for clinical note transcription with the patient, who gave verbal consent to proceed.  History of Present Illness   The patient, with a history of hypertension and rheumatoid arthritis, presents with a severe sinus infection for which she initiated Augmentin  via an e-visit the day prior. She denies any side effects from the antibiotic. She also reports taking losartan  for hypertension and feels her blood pressure has been well controlled, despite a recent reading that was higher than usual. She denies any symptoms of hypertension such as headache, blurred vision, chest pain, shortness of breath, or dizziness.  In addition to the sinus infection, the patient reports experiencing palpitations, described as extremely random with no precipitating factors. These episodes can occur during rest or activity and are typically short-lived, resolving with a deep inhalation. The patient has worn a heart monitor twice in the past, with findings of transient SVT. The most recent episode of palpitations occurred within the last week.  She undergoes regular lab work every three months due to her rheumatoid arthritis. She denies any recent changes in activity level or menstrual cycles. She has received her annual flu shot and is up to date on all other screenings.       Outpatient Medications Prior to Visit  Medication Sig Dispense Refill   amoxicillin -clavulanate (AUGMENTIN ) 875-125 MG tablet Take 1 tablet by mouth 2 (two) times daily for 7 days. 14 tablet 0   ascorbic acid (VITAMIN C) 1000 MG tablet Take 1,000 mg by mouth daily.     Bacillus Coagulans-Inulin (PROBIOTIC) 1-250 BILLION-MG CAPS      busPIRone  (BUSPAR ) 15 MG tablet Take 1 tablet (15 mg total)  by mouth 2 (two) times daily. (Patient taking differently: Take 15 mg by mouth. Bid as needed) 180 tablet 1   celecoxib  (CELEBREX ) 200 MG capsule Take 1 capsule (200 mg total) by mouth 2 (two) times daily as needed with food 180 capsule 1   certolizumab pegol  (CIMZIA , 2 SYRINGE,) 200 MG/ML prefilled syringe Use 1 injection subcutaneously every other week 1 each 4   cetirizine  (ZYRTEC ) 10 MG tablet Take 1 tablet (10 mg total) by mouth daily. 30 tablet 3   Cholecalciferol (VITAMIN D ) 50 MCG (2000 UT) CAPS Take 1 capsule by mouth daily.     clindamycin  1 MG/0.1 mL SOLN As needed     clobetasol  ointment (TEMOVATE ) 0.05 % as needed.  0   EPINEPHrine (PRIMATENE MIST) 0.125 MG/ACT AERO      escitalopram  (LEXAPRO ) 20 MG tablet Take 1 tablet (20 mg total) by mouth daily. 90 tablet 0   esomeprazole  (NEXIUM ) 40 MG capsule Take 1 capsule (40 mg total) by mouth daily. 90 capsule 0   etonogestrel -ethinyl estradiol  (NUVARING) 0.12-0.015 MG/24HR vaginal ring Place 1 ring vaginally every 28 (twenty-eight) days using continuously. 3 each 4   famotidine  (PEPCID ) 20 MG tablet Take 20 mg by mouth at bedtime. Take 1 tablets by mouth daily at bedtime     fenofibrate  160 MG tablet Take 1 tablet (160 mg total) by mouth daily. 90 tablet 0   folic acid  (FOLVITE ) 1 MG tablet Take 1 tablet (1 mg total) by mouth daily. 90 tablet 3   ibuprofen  (ADVIL ) 800 MG tablet Take 1 tablet (  800 mg total) by mouth every 8 (eight) hours as needed. 90 tablet 0   losartan  (COZAAR ) 50 MG tablet Take 1 tablet (50 mg total) by mouth daily. 90 tablet 0   MAGNESIUM  MALATE PO Take 1 tablet by mouth at bedtime.     methotrexate  50 MG/2ML injection Inject 1 mL (25 mg total) into the skin every 7 (seven) days. 12 mL 0   Multiple Vitamin (MULTIVITAMIN) tablet Take 1 tablet by mouth daily.     mupirocin ointment (BACTROBAN) 2 % Apply 1 Application topically 2 (two) times daily as needed.     omega-3 acid ethyl esters (LOVAZA ) 1 g capsule Take 2  capsules (2 g total) by mouth 2 (two) times daily. 360 capsule 0   zolpidem  (AMBIEN ) 5 MG tablet Take 1 tablet (5 mg total) by mouth at bedtime as needed for sleep. 90 tablet 1   predniSONE  (DELTASONE ) 20 MG tablet Take 2 tablets (40 mg total) by mouth daily with breakfast. 10 tablet 0   Sod Fluoride -Potassium Nitrate  (SODIUM FLUORIDE  5000 SENSITIVE) 1.1-5 % GEL Apply a pea sized amount on tooth brush and brush for 2 minutes and spit excess. Do not rinse with water after that. 100 mL 6   No facility-administered medications prior to visit.    ROS Review of Systems  Constitutional:  Negative for chills, diaphoresis, fatigue and fever.  HENT:  Positive for congestion.   Eyes: Negative.   Respiratory:  Negative for cough, chest tightness, shortness of breath and wheezing.   Cardiovascular:  Positive for palpitations. Negative for chest pain and leg swelling.  Gastrointestinal:  Negative for abdominal pain, constipation, diarrhea, nausea and vomiting.  Endocrine: Negative.   Genitourinary: Negative.  Negative for difficulty urinating.  Musculoskeletal:  Positive for arthralgias. Negative for back pain, joint swelling and myalgias.  Skin: Negative.  Negative for color change and rash.  Neurological:  Negative for dizziness, weakness and light-headedness.  Hematological:  Negative for adenopathy. Does not bruise/bleed easily.  Psychiatric/Behavioral:  Positive for sleep disturbance. Negative for confusion, decreased concentration, dysphoric mood and suicidal ideas.     Objective:  BP 126/82 (BP Location: Left Arm, Patient Position: Sitting, Cuff Size: Large)   Pulse 95   Temp 98.2 F (36.8 C) (Oral)   Resp 16   Ht 5' 11.25 (1.81 m)   Wt 267 lb 12.8 oz (121.5 kg)   SpO2 94%   BMI 37.09 kg/m   BP Readings from Last 3 Encounters:  11/20/23 126/82  11/07/23 136/86  11/06/23 126/72    Wt Readings from Last 3 Encounters:  11/20/23 267 lb 12.8 oz (121.5 kg)  11/07/23 271 lb (122.9  kg)  11/06/23 271 lb (122.9 kg)    Physical Exam Vitals reviewed.  Constitutional:      Appearance: Normal appearance.  HENT:     Nose: Nose normal.     Mouth/Throat:     Mouth: Mucous membranes are moist.  Eyes:     General: No scleral icterus.    Conjunctiva/sclera: Conjunctivae normal.  Cardiovascular:     Rate and Rhythm: Normal rate and regular rhythm.     Pulses: Normal pulses.     Heart sounds: No murmur heard.    No friction rub. No gallop.     Comments: EKG- NSR, 86 bpm No LVH, Q waves, or ST/T wave changes  Pulmonary:     Effort: Pulmonary effort is normal.     Breath sounds: No stridor. No wheezing, rhonchi or rales.  Abdominal:     General: Abdomen is flat.     Palpations: There is no mass.     Tenderness: There is no abdominal tenderness. There is no guarding.     Hernia: No hernia is present.  Musculoskeletal:     Cervical back: Neck supple.     Right lower leg: No edema.     Left lower leg: No edema.  Lymphadenopathy:     Cervical: No cervical adenopathy.  Skin:    General: Skin is warm and dry.  Neurological:     General: No focal deficit present.     Mental Status: She is alert. Mental status is at baseline.  Psychiatric:        Mood and Affect: Mood normal.        Behavior: Behavior normal.     Lab Results  Component Value Date   WBC 6.5 09/04/2023   HGB 13.2 09/04/2023   HCT 40 09/04/2023   PLT 426 (A) 09/04/2023   GLUCOSE 83 09/17/2022   CHOL 230 (H) 11/20/2023   TRIG 129.0 11/20/2023   HDL 79.60 11/20/2023   LDLDIRECT 96.0 05/26/2019   LDLCALC 124 (H) 11/20/2023   ALT 18 09/04/2023   AST 17 09/04/2023   NA 139 09/04/2023   K 4.1 09/04/2023   CL 104 09/04/2023   CREATININE 0.7 09/04/2023   BUN 11 09/04/2023   CO2 21 09/04/2023   TSH 1.71 11/20/2023   HGBA1C 5.8 11/20/2023    No results found.  Assessment & Plan:   Benign essential hypertension- Her BP is well controlled. -     TSH; Future -     Urinalysis, Routine w  reflex microscopic; Future  Heart palpitations- EKG is normal. Description is c/w benign dysrhythmia.SABRA -     EKG 12-Lead -     TSH; Future  Vitamin D  deficiency -     VITAMIN D  25 Hydroxy (Vit-D Deficiency, Fractures); Future  Encounter for general adult medical examination with abnormal findings - Exam completed, labs reviewed, vaccines reviewed, cancer screenings are UTD, pt ed material was given.  -     Lipid panel; Future  Prediabetes -     Hemoglobin A1c; Future  Psychophysiological insomnia -     Zolpidem  Tartrate; Take 1 tablet (5 mg total) by mouth at bedtime as needed for sleep.  Dispense: 30 tablet; Refill: 0     Follow-up: Return in about 6 months (around 05/19/2024).  Debby Molt, MD

## 2023-11-21 ENCOUNTER — Other Ambulatory Visit (HOSPITAL_COMMUNITY): Payer: Self-pay

## 2023-11-21 ENCOUNTER — Other Ambulatory Visit: Payer: Self-pay | Admitting: Internal Medicine

## 2023-11-21 ENCOUNTER — Other Ambulatory Visit: Payer: Self-pay

## 2023-11-21 ENCOUNTER — Encounter (HOSPITAL_COMMUNITY): Payer: Self-pay

## 2023-11-24 ENCOUNTER — Other Ambulatory Visit: Payer: Self-pay | Admitting: Pharmacist

## 2023-11-24 ENCOUNTER — Other Ambulatory Visit (HOSPITAL_BASED_OUTPATIENT_CLINIC_OR_DEPARTMENT_OTHER): Payer: Self-pay

## 2023-11-24 ENCOUNTER — Other Ambulatory Visit: Payer: Self-pay

## 2023-11-24 ENCOUNTER — Other Ambulatory Visit (HOSPITAL_COMMUNITY): Payer: Self-pay

## 2023-11-24 MED ORDER — CIMZIA (2 SYRINGE) 200 MG/ML ~~LOC~~ PSKT
PREFILLED_SYRINGE | SUBCUTANEOUS | 0 refills | Status: DC
  Filled 2023-11-24: qty 2, 28d supply, fill #0

## 2023-11-24 MED ORDER — CIMZIA (2 SYRINGE) 200 MG/ML ~~LOC~~ PSKT: PREFILLED_SYRINGE | SUBCUTANEOUS | 0 refills | Status: DC

## 2023-12-02 ENCOUNTER — Other Ambulatory Visit (HOSPITAL_COMMUNITY): Payer: Self-pay

## 2023-12-02 ENCOUNTER — Other Ambulatory Visit: Payer: Self-pay | Admitting: Internal Medicine

## 2023-12-02 DIAGNOSIS — E781 Pure hyperglyceridemia: Secondary | ICD-10-CM

## 2023-12-02 MED ORDER — ESCITALOPRAM OXALATE 20 MG PO TABS
20.0000 mg | ORAL_TABLET | Freq: Every day | ORAL | 1 refills | Status: DC
  Filled 2023-12-02: qty 90, 90d supply, fill #0
  Filled 2024-02-27: qty 90, 90d supply, fill #1

## 2023-12-02 MED ORDER — ESOMEPRAZOLE MAGNESIUM 40 MG PO CPDR
40.0000 mg | DELAYED_RELEASE_CAPSULE | Freq: Every day | ORAL | 1 refills | Status: DC
  Filled 2023-12-02: qty 90, 90d supply, fill #0
  Filled 2024-03-05: qty 90, 90d supply, fill #1

## 2023-12-02 MED ORDER — OMEGA-3-ACID ETHYL ESTERS 1 G PO CAPS
2.0000 g | ORAL_CAPSULE | Freq: Two times a day (BID) | ORAL | 1 refills | Status: DC
  Filled 2023-12-02: qty 360, 90d supply, fill #0
  Filled 2024-02-26: qty 360, 90d supply, fill #1

## 2023-12-03 ENCOUNTER — Other Ambulatory Visit (HOSPITAL_COMMUNITY): Payer: Self-pay

## 2023-12-03 DIAGNOSIS — L732 Hidradenitis suppurativa: Secondary | ICD-10-CM | POA: Diagnosis not present

## 2023-12-03 DIAGNOSIS — M0579 Rheumatoid arthritis with rheumatoid factor of multiple sites without organ or systems involvement: Secondary | ICD-10-CM | POA: Diagnosis not present

## 2023-12-03 DIAGNOSIS — Z79899 Other long term (current) drug therapy: Secondary | ICD-10-CM | POA: Diagnosis not present

## 2023-12-03 DIAGNOSIS — L409 Psoriasis, unspecified: Secondary | ICD-10-CM | POA: Diagnosis not present

## 2023-12-03 DIAGNOSIS — E669 Obesity, unspecified: Secondary | ICD-10-CM | POA: Diagnosis not present

## 2023-12-03 DIAGNOSIS — M461 Sacroiliitis, not elsewhere classified: Secondary | ICD-10-CM | POA: Diagnosis not present

## 2023-12-03 DIAGNOSIS — Z6839 Body mass index (BMI) 39.0-39.9, adult: Secondary | ICD-10-CM | POA: Diagnosis not present

## 2023-12-15 ENCOUNTER — Encounter: Payer: Self-pay | Admitting: Internal Medicine

## 2023-12-15 DIAGNOSIS — Z1231 Encounter for screening mammogram for malignant neoplasm of breast: Secondary | ICD-10-CM | POA: Diagnosis not present

## 2023-12-16 ENCOUNTER — Encounter: Payer: Self-pay | Admitting: Internal Medicine

## 2023-12-16 NOTE — Progress Notes (Unsigned)
Subjective:    [name], female    DOB: May 15, 1976, 48 y.o.   MRN: [id number]      HPI Vali is here for No chief complaint on file.   Wheezing -      Medications and allergies reviewed with patient and updated if appropriate.  Current Outpatient Medications on File Prior to Visit  Medication Sig Dispense Refill   ascorbic acid (VITAMIN C) 1000 MG tablet Take 1,000 mg by mouth daily.     Bacillus Coagulans-Inulin (PROBIOTIC) 1-250 BILLION-MG CAPS      busPIRone (BUSPAR) 15 MG tablet Take 1 tablet (15 mg total) by mouth 2 (two) times daily. (Patient taking differently: Take 15 mg by mouth. Bid as needed) 180 tablet 1   celecoxib (CELEBREX) 200 MG capsule Take 1 capsule (200 mg total) by mouth 2 (two) times daily as needed with food 180 capsule 1   certolizumab pegol (CIMZIA, 2 SYRINGE,) 200 MG/ML prefilled syringe Inject one syringe into the skin every other week, 30 days. 2 mL 0   cetirizine (ZYRTEC) 10 MG tablet Take 1 tablet (10 mg total) by mouth daily. 30 tablet 3   Cholecalciferol (VITAMIN D) 50 MCG (2000 UT) CAPS Take 1 capsule by mouth daily.     clindamycin 1 MG/0.1 mL SOLN As needed     clobetasol ointment (TEMOVATE) 0.05 % as needed.  0   EPINEPHrine (PRIMATENE MIST) 0.125 MG/ACT AERO      escitalopram (LEXAPRO) 20 MG tablet Take 1 tablet (20 mg total) by mouth daily. 90 tablet 1   esomeprazole (NEXIUM) 40 MG capsule Take 1 capsule (40 mg total) by mouth daily. 90 capsule 1   etonogestrel-ethinyl estradiol (NUVARING) 0.12-0.015 MG/24HR vaginal ring Place 1 ring vaginally every 28 (twenty-eight) days using continuously. 3 each 4   famotidine (PEPCID) 20 MG tablet Take 20 mg by mouth at bedtime. Take 1 tablets by mouth daily at bedtime     fenofibrate 160 MG tablet Take 1 tablet (160 mg total) by mouth daily. 90 tablet 0   folic acid (FOLVITE) 1 MG tablet Take 1 tablet (1 mg total) by mouth daily. 90 tablet 3   ibuprofen (ADVIL) 800 MG tablet  Take 1 tablet (800 mg total) by mouth every 8 (eight) hours as needed. 90 tablet 0   losartan (COZAAR) 50 MG tablet Take 1 tablet (50 mg total) by mouth daily. 90 tablet 0   MAGNESIUM MALATE PO Take 1 tablet by mouth at bedtime.     methotrexate 50 MG/2ML injection Inject 1 mL (25 mg total) into the skin every 7 (seven) days. 12 mL 0   Multiple Vitamin (MULTIVITAMIN) tablet Take 1 tablet by mouth daily.     mupirocin ointment (BACTROBAN) 2 % Apply 1 Application topically 2 (two) times daily as needed.     omega-3 acid ethyl esters (LOVAZA) 1 g capsule Take 2 capsules (2 g total) by mouth 2 (two) times daily. 360 capsule 1   zolpidem (AMBIEN) 5 MG tablet Take 1 tablet (5 mg total) by mouth at bedtime as needed for sleep. 30 tablet 0   No current facility-administered medications on file prior to visit.    Review of Systems     Objective:  There were no vitals filed for this visit. BP Readings from Last 3 Encounters:  11/20/23 126/82  11/07/23 136/86  11/06/23 126/72   Wt Readings from Last 3 Encounters:  11/20/23 267 lb 12.8 oz (121.5 kg)  11/07/23 271 lb (122.9 kg)  11/06/23 271 lb (122.9 kg)   There is no height or weight on file to calculate BMI.    Physical Exam         Assessment & Plan:    See Problem List for Assessment and Plan of chronic medical problems.

## 2023-12-17 ENCOUNTER — Other Ambulatory Visit (HOSPITAL_COMMUNITY): Payer: Self-pay

## 2023-12-17 ENCOUNTER — Encounter: Payer: Self-pay | Admitting: Internal Medicine

## 2023-12-17 ENCOUNTER — Ambulatory Visit: Payer: Commercial Managed Care - PPO | Admitting: Internal Medicine

## 2023-12-17 ENCOUNTER — Ambulatory Visit (INDEPENDENT_AMBULATORY_CARE_PROVIDER_SITE_OTHER): Payer: Commercial Managed Care - PPO

## 2023-12-17 ENCOUNTER — Other Ambulatory Visit: Payer: Self-pay

## 2023-12-17 VITALS — BP 120/78 | HR 98 | Temp 98.8°F | Ht 71.25 in | Wt 270.0 lb

## 2023-12-17 DIAGNOSIS — J209 Acute bronchitis, unspecified: Secondary | ICD-10-CM

## 2023-12-17 DIAGNOSIS — R062 Wheezing: Secondary | ICD-10-CM | POA: Diagnosis not present

## 2023-12-17 DIAGNOSIS — R059 Cough, unspecified: Secondary | ICD-10-CM | POA: Diagnosis not present

## 2023-12-17 LAB — POC COVID19 BINAXNOW: SARS Coronavirus 2 Ag: NEGATIVE

## 2023-12-17 LAB — POC INFLUENZA A&B (BINAX/QUICKVUE)
Influenza A, POC: NEGATIVE
Influenza B, POC: NEGATIVE

## 2023-12-17 MED ORDER — AMOXICILLIN-POT CLAVULANATE 875-125 MG PO TABS
1.0000 | ORAL_TABLET | Freq: Two times a day (BID) | ORAL | 0 refills | Status: AC
  Filled 2023-12-17: qty 20, 10d supply, fill #0

## 2023-12-17 MED ORDER — ALBUTEROL SULFATE HFA 108 (90 BASE) MCG/ACT IN AERS
2.0000 | INHALATION_SPRAY | Freq: Four times a day (QID) | RESPIRATORY_TRACT | 0 refills | Status: AC | PRN
Start: 2023-12-17 — End: ?
  Filled 2023-12-17: qty 6.7, 21d supply, fill #0

## 2023-12-17 MED ORDER — HYDROCOD POLI-CHLORPHE POLI ER 10-8 MG/5ML PO SUER
5.0000 mL | Freq: Two times a day (BID) | ORAL | 0 refills | Status: DC | PRN
  Filled 2023-12-17: qty 115, 12d supply, fill #0

## 2023-12-17 NOTE — Addendum Note (Signed)
Addended by: Karma Ganja on: 12/17/2023 04:24 PM   Modules accepted: Orders

## 2023-12-17 NOTE — Patient Instructions (Addendum)
      Chest xray ordered.     Medications changes include :   tussionex, augmentin, albuterol      Return if symptoms worsen or fail to improve.

## 2023-12-19 ENCOUNTER — Other Ambulatory Visit: Payer: Self-pay

## 2023-12-22 ENCOUNTER — Other Ambulatory Visit: Payer: Self-pay

## 2023-12-22 ENCOUNTER — Other Ambulatory Visit (HOSPITAL_COMMUNITY): Payer: Self-pay

## 2023-12-22 ENCOUNTER — Other Ambulatory Visit: Payer: Self-pay | Admitting: Pharmacist

## 2023-12-22 ENCOUNTER — Other Ambulatory Visit: Payer: Self-pay | Admitting: Pharmacy Technician

## 2023-12-22 MED ORDER — CERTOLIZUMAB PEGOL 200 MG/ML ~~LOC~~ PSKT
200.0000 mg | PREFILLED_SYRINGE | SUBCUTANEOUS | 5 refills | Status: DC
  Filled 2023-12-22: qty 1, 28d supply, fill #0
  Filled 2024-01-22: qty 1, 28d supply, fill #1
  Filled 2024-02-26: qty 1, 28d supply, fill #2
  Filled 2024-03-26 (×2): qty 1, 28d supply, fill #3
  Filled 2024-04-26: qty 1, 28d supply, fill #4
  Filled 2024-05-24: qty 1, 28d supply, fill #5

## 2023-12-22 MED ORDER — CERTOLIZUMAB PEGOL 200 MG/ML ~~LOC~~ PSKT: 200.0000 mg | PREFILLED_SYRINGE | SUBCUTANEOUS | 5 refills | Status: DC

## 2023-12-22 NOTE — Progress Notes (Signed)
Specialty Pharmacy Refill Coordination Note  April [name] is a 48 y.o. female contacted today regarding refills of specialty medication(s) Certolizumab Pegol (Cimzia (2 Syringe))   Patient requested Delivery   Delivery date: 01/06/24   Verified address: Patient address [address]  [facility] [place]   Medication will be filled on 01/05/24.  Refill Request sent to MD  [facility]. Send to [facility] for re-write.

## 2023-12-23 ENCOUNTER — Other Ambulatory Visit: Payer: Self-pay

## 2023-12-24 NOTE — Progress Notes (Signed)
 [facility] [address] [place] [address] Phone: [phone] Subjective:   ISusannah Gully, am serving as a scribe for Dr. Arthea Claudene.  I'm seeing this patient by the request  of:  Joshua Debby CROME, MD  CC: Back and neck pain follow-up  [id number]  Porshia KIANTE PETROVICH is a 48 y.o. female coming in with complaint of back and neck pain. OMT 11/06/2023. 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  Patient states wrist doing well!   Medications patient has been prescribed: None  Taking:         Reviewed prior external information including notes and imaging from previsou exam, outside providers and external EMR if available.   As well as notes that were available from care everywhere and other healthcare systems.  Past medical history, social, surgical and family history all reviewed in electronic medical record.  No pertanent information unless stated regarding to the chief complaint.   Past Medical History:  Diagnosis Date   ALLERGIC RHINITIS    Anal fissure    Anxiety    ANXIETY DEPRESSION    Back pain    CONSTIPATION    Constipation    DEPRESSION    Esophageal reflux    Food allergy    Avacados   Gastritis    GERD (gastroesophageal reflux disease)    HYPERCHOLESTEROLEMIA    Hyperlipidemia    Hypertension    OBESITY    Occipital neuralgia    Palpitations    Rheumatoid arthritis (HCC) 12/24/2017    Allergies  Allergen Reactions   Ciprofloxacin     toxicity   Levaquin  [Levofloxacin ] Anxiety    Possible CNS side effects. TOXICITY   Moxifloxacin     TOXICITY   Avocado     Other reaction(s): Unknown     Review of Systems:  No headache, visual changes, nausea, vomiting, diarrhea, constipation, dizziness, abdominal pain, skin rash, fevers, chills, night sweats, weight loss, swollen lymph nodes, body aches, joint swelling, chest pain, shortness of breath, mood changes. POSITIVE muscle aches  Objective  Blood pressure  126/86, pulse 96, height 5' 11 (1.803 m), SpO2 98%.   General: No apparent distress alert and oriented x3 mood and affect normal, dressed appropriately.  HEENT: Pupils equal, extraocular movements intact  Respiratory: Patient's speak in full sentences and does not appear short of breath  Cardiovascular: No lower extremity edema, non tender, no erythema  MSK:  Back does have some loss lordosis noted.  Some tenderness to palpation of the paraspinal musculature.  Tightness noted in the sternal area bilaterally right greater than left.  Slight bruit noted on the right side  Osteopathic findings  C2 flexed rotated and side bent right C6 flexed rotated and side bent left T3 extended rotated and side bent right inhaled rib T7 extended rotated and side bent right inhaled rib L1 flexed rotated and side bent right L3 flexed rotated and side bent left Sacrum right on right       Assessment and Plan:  SI (sacroiliac) joint dysfunction Chronic discomfort but is actually responding extremely well to osteopathic ambulation.  Discussed which activities to do and which ones to avoid.  Increase activity slowly.  Discussed icing regimen.  Follow-up again in 6 to 8 weeks    Nonallopathic problems  Decision today to treat with OMT was based on Physical Exam  After verbal consent patient was treated with HVLA, ME, FPR techniques in cervical, rib, thoracic, lumbar, and sacral  areas  Patient tolerated the procedure well with improvement in symptoms  Patient given exercises, stretches and lifestyle modifications  See medications in patient instructions if given  Patient will follow up in 4-8 weeks      The above documentation has been reviewed and is accurate and complete Milbert Bixler M Rashard Ryle, DO        Note: This dictation was prepared with Dragon dictation along with smaller phrase technology. Any transcriptional errors that result from this process are unintentional.

## 2023-12-25 ENCOUNTER — Other Ambulatory Visit: Payer: Self-pay

## 2023-12-25 ENCOUNTER — Encounter: Payer: Self-pay | Admitting: Family Medicine

## 2023-12-25 ENCOUNTER — Ambulatory Visit: Payer: Commercial Managed Care - PPO | Admitting: Family Medicine

## 2023-12-25 VITALS — BP 126/86 | HR 96 | Ht 71.0 in

## 2023-12-25 DIAGNOSIS — M9901 Segmental and somatic dysfunction of cervical region: Secondary | ICD-10-CM | POA: Diagnosis not present

## 2023-12-25 DIAGNOSIS — M9908 Segmental and somatic dysfunction of rib cage: Secondary | ICD-10-CM

## 2023-12-25 DIAGNOSIS — M9902 Segmental and somatic dysfunction of thoracic region: Secondary | ICD-10-CM

## 2023-12-25 DIAGNOSIS — M9904 Segmental and somatic dysfunction of sacral region: Secondary | ICD-10-CM

## 2023-12-25 DIAGNOSIS — M533 Sacrococcygeal disorders, not elsewhere classified: Secondary | ICD-10-CM | POA: Diagnosis not present

## 2023-12-25 DIAGNOSIS — M25532 Pain in left wrist: Secondary | ICD-10-CM | POA: Diagnosis not present

## 2023-12-25 DIAGNOSIS — M9903 Segmental and somatic dysfunction of lumbar region: Secondary | ICD-10-CM

## 2023-12-25 NOTE — Patient Instructions (Signed)
 Doing great Don't let the baby boomer get you See you again in 6-8 weeks

## 2023-12-25 NOTE — Assessment & Plan Note (Signed)
 Chronic discomfort but is actually responding extremely well to osteopathic ambulation.  Discussed which activities to do and which ones to avoid.  Increase activity slowly.  Discussed icing regimen.  Follow-up again in 6 to 8 weeks

## 2024-01-01 ENCOUNTER — Other Ambulatory Visit (HOSPITAL_COMMUNITY): Payer: Self-pay

## 2024-01-01 ENCOUNTER — Other Ambulatory Visit: Payer: Self-pay

## 2024-01-01 ENCOUNTER — Other Ambulatory Visit: Payer: Self-pay | Admitting: Internal Medicine

## 2024-01-01 DIAGNOSIS — I10 Essential (primary) hypertension: Secondary | ICD-10-CM

## 2024-01-01 MED ORDER — LOSARTAN POTASSIUM 50 MG PO TABS
50.0000 mg | ORAL_TABLET | Freq: Every day | ORAL | 0 refills | Status: DC
  Filled 2024-01-01: qty 90, 90d supply, fill #0

## 2024-01-01 MED ORDER — FENOFIBRATE 160 MG PO TABS
160.0000 mg | ORAL_TABLET | Freq: Every day | ORAL | 0 refills | Status: DC
  Filled 2024-01-01: qty 90, 90d supply, fill #0

## 2024-01-05 ENCOUNTER — Other Ambulatory Visit: Payer: Self-pay

## 2024-01-22 ENCOUNTER — Other Ambulatory Visit: Payer: Self-pay

## 2024-01-22 ENCOUNTER — Other Ambulatory Visit (HOSPITAL_COMMUNITY): Payer: Self-pay

## 2024-01-22 ENCOUNTER — Encounter: Payer: Self-pay | Admitting: Internal Medicine

## 2024-01-22 ENCOUNTER — Other Ambulatory Visit: Payer: Self-pay | Admitting: Pharmacy Technician

## 2024-01-22 MED ORDER — MONTELUKAST SODIUM 10 MG PO TABS
10.0000 mg | ORAL_TABLET | Freq: Every day | ORAL | 0 refills | Status: DC
  Filled 2024-01-22: qty 90, 90d supply, fill #0

## 2024-01-22 NOTE — Progress Notes (Signed)
 Specialty Pharmacy Refill Coordination Note  April [name] is a 48 y.o. female contacted today regarding refills of specialty medication(s) No data recorded  Patient requested (Patient-Rptd) Delivery   Delivery date: (Patient-Rptd) 02/04/24   Verified address: (Patient-Rptd) [address] [place], [address]   Medication will be filled on 02/03/24.

## 2024-02-03 ENCOUNTER — Other Ambulatory Visit (HOSPITAL_COMMUNITY): Payer: Self-pay

## 2024-02-03 ENCOUNTER — Other Ambulatory Visit: Payer: Self-pay

## 2024-02-04 ENCOUNTER — Other Ambulatory Visit: Payer: Self-pay

## 2024-02-04 ENCOUNTER — Other Ambulatory Visit (HOSPITAL_COMMUNITY): Payer: Self-pay

## 2024-02-04 NOTE — Progress Notes (Signed)
 Pharmacy Patient Advocate Encounter   Received notification from Patient Pharmacy that prior authorization for Cimzia is required/requested.   Insurance verification completed.   The patient is insured through [employer] .   Per test claim: PA required; PA submitted to above mentioned insurance via CoverMyMeds Key/confirmation #/EOC [id number] Status is pending

## 2024-02-04 NOTE — Progress Notes (Signed)
 Pharmacy Patient Advocate Encounter  Received notification from [facility] that Prior Authorization for Cimzia has been APPROVED from 02/04/24 to 02/03/25   PA #/Case ID/Reference #: [id number]

## 2024-02-04 NOTE — Progress Notes (Unsigned)
 [name] [address] [place] [address] Phone: [phone] Subjective:   April [name], am serving as a scribe for Dr. Antoine Primas.  I'm seeing this patient by the request  of:  April Grandchild, MD  CC: Back and neck pain follow-up  [id number]  April [name] is a 48 y.o. female coming in with complaint of back and neck pain. OMT 12/25/2023. Patient states doing well. No new symptoms or big changes.  Medications patient has been prescribed: None  Taking:         Reviewed prior external information including notes and imaging from previsou exam, outside providers and external EMR if available.   As well as notes that were available from care everywhere and other healthcare systems.  Past medical history, social, surgical and family history all reviewed in electronic medical record.  No pertanent information unless stated regarding to the chief complaint.   Past Medical History:  Diagnosis Date   ALLERGIC RHINITIS    Anal fissure    Anxiety    ANXIETY DEPRESSION    Back pain    CONSTIPATION    Constipation    DEPRESSION    Esophageal reflux    Food allergy    Avacados   Gastritis    GERD (gastroesophageal reflux disease)    HYPERCHOLESTEROLEMIA    Hyperlipidemia    Hypertension    OBESITY    Occipital neuralgia    Palpitations    Rheumatoid arthritis (HCC) 12/24/2017    Allergies  Allergen Reactions   Ciprofloxacin     toxicity   Levaquin [Levofloxacin] Anxiety    Possible CNS side effects. TOXICITY   Moxifloxacin     TOXICITY   Avocado     Other reaction(s): Unknown     Review of Systems:  No headache, visual changes, nausea, vomiting, diarrhea, constipation, dizziness, abdominal pain, skin rash, fevers, chills, night sweats, weight loss, swollen lymph nodes, body aches, joint swelling, chest pain, shortness of breath, mood changes. POSITIVE muscle aches  Objective  Blood pressure 126/78, pulse  88, height 5\' 11"  (1.803 m), SpO2 98%.   General: No apparent distress alert and oriented x3 mood and affect normal, dressed appropriately.  HEENT: Pupils equal, extraocular movements intact  Respiratory: Patient's speak in full sentences and does not appear short of breath  Cardiovascular: No lower extremity edema, non tender, no erythema  Gait relatively normal MSK:  Back does have some loss lordosis noted.  Some tenderness to palpation in the paraspinal musculature.  Some tightness noted with April [name] right greater than left.  Osteopathic findings  C2 flexed rotated and side bent right C6 flexed rotated and side bent left T3 extended rotated and side bent right inhaled rib T7 extended rotated and side bent left L3 flexed rotated and side bent right Sacrum right on right       Assessment and Plan:  SI (sacroiliac) joint dysfunction Tightness noted in the sacroiliac joint.  Discussed icing regimen and home exercises, increase activity slowly.  Discussed icing regimen.  Follow-up again in 6 to 8 weeks.  Continue work on core strength.  Can make changes for ergonomics at work if necessary.    Nonallopathic problems  Decision today to treat with OMT was based on Physical Exam  After verbal consent patient was treated with HVLA, ME, FPR techniques in cervical, rib, thoracic, lumbar, and sacral  areas  Patient tolerated the procedure well with improvement in symptoms  Patient given exercises,  stretches and lifestyle modifications  See medications in patient instructions if given  Patient will follow up in 4-8 weeks     The above documentation has been reviewed and is accurate and complete April Saa, DO         Note: This dictation was prepared with Dragon dictation along with smaller phrase technology. Any transcriptional errors that result from this process are unintentional.

## 2024-02-05 ENCOUNTER — Ambulatory Visit: Payer: Commercial Managed Care - PPO | Admitting: Family Medicine

## 2024-02-05 ENCOUNTER — Encounter: Payer: Self-pay | Admitting: Family Medicine

## 2024-02-05 VITALS — BP 126/78 | HR 88 | Ht 71.0 in

## 2024-02-05 DIAGNOSIS — M9908 Segmental and somatic dysfunction of rib cage: Secondary | ICD-10-CM | POA: Diagnosis not present

## 2024-02-05 DIAGNOSIS — M533 Sacrococcygeal disorders, not elsewhere classified: Secondary | ICD-10-CM

## 2024-02-05 DIAGNOSIS — M9902 Segmental and somatic dysfunction of thoracic region: Secondary | ICD-10-CM

## 2024-02-05 DIAGNOSIS — M9903 Segmental and somatic dysfunction of lumbar region: Secondary | ICD-10-CM

## 2024-02-05 DIAGNOSIS — M9904 Segmental and somatic dysfunction of sacral region: Secondary | ICD-10-CM

## 2024-02-05 DIAGNOSIS — M9901 Segmental and somatic dysfunction of cervical region: Secondary | ICD-10-CM

## 2024-02-05 NOTE — Assessment & Plan Note (Signed)
 Tightness noted in the sacroiliac joint.  Discussed icing regimen and home exercises, increase activity slowly.  Discussed icing regimen.  Follow-up again in 6 to 8 weeks.  Continue work on core strength.  Can make changes for ergonomics at work if necessary.

## 2024-02-05 NOTE — Patient Instructions (Signed)
 Good to see you!  Overall doing wonderful See you again in 6-8 weeks

## 2024-02-25 ENCOUNTER — Other Ambulatory Visit: Payer: Self-pay

## 2024-02-26 ENCOUNTER — Other Ambulatory Visit: Payer: Self-pay

## 2024-02-26 NOTE — Progress Notes (Signed)
 Specialty Pharmacy Refill Coordination Note  April [name] is a 48 y.o. female contacted today regarding refills of specialty medication(s) Certolizumab Pegol [facility])   Patient requested (Patient-Rptd) Delivery   Delivery date: (Patient-Rptd) 03/03/24   Verified address: (Patient-Rptd) [address] [place] [address]   Medication will be filled on 03/02/24.

## 2024-02-27 ENCOUNTER — Other Ambulatory Visit (HOSPITAL_COMMUNITY): Payer: Self-pay

## 2024-03-02 ENCOUNTER — Other Ambulatory Visit: Payer: Self-pay

## 2024-03-04 DIAGNOSIS — M0579 Rheumatoid arthritis with rheumatoid factor of multiple sites without organ or systems involvement: Secondary | ICD-10-CM | POA: Diagnosis not present

## 2024-03-05 ENCOUNTER — Other Ambulatory Visit: Payer: Self-pay

## 2024-03-05 ENCOUNTER — Other Ambulatory Visit (HOSPITAL_COMMUNITY): Payer: Self-pay

## 2024-03-08 ENCOUNTER — Other Ambulatory Visit (HOSPITAL_COMMUNITY): Payer: Self-pay

## 2024-03-08 MED ORDER — METHOTREXATE SODIUM CHEMO INJECTION 50 MG/2ML
25.0000 mg | INTRAMUSCULAR | 0 refills | Status: DC
Start: 2024-03-08 — End: 2024-05-25
  Filled 2024-03-08: qty 12, 84d supply, fill #0

## 2024-03-24 NOTE — Progress Notes (Unsigned)
 [facility] [address] [place] [address] Phone: [phone] Subjective:   IBryan Caprio, am serving as a scribe for Dr. Ronnell Coins.  I'm seeing this patient by the request  of:  Arcadio Knuckles, MD  CC: Neck and back pain follow-up  [id number]  Sereena JANNY SPENGLER is a 48 y.o. female coming in with complaint of back and neck pain. OMT 02/05/2024. Patient states doing well. Same per usual.  Tenderness and swelling of the SI joint.  Patient has had costochondritis previously.  Does not seem to be that at the moment.  Medications patient has been prescribed: None           Reviewed prior external information including notes and imaging from previsou exam, outside providers and external EMR if available.   As well as notes that were available from care everywhere and other healthcare systems.  Past medical history, social, surgical and family history all reviewed in electronic medical record.  No pertanent information unless stated regarding to the chief complaint.   Past Medical History:  Diagnosis Date   ALLERGIC RHINITIS    Anal fissure    Anxiety    ANXIETY DEPRESSION    Back pain    CONSTIPATION    Constipation    DEPRESSION    Esophageal reflux    Food allergy    Avacados   Gastritis    GERD (gastroesophageal reflux disease)    HYPERCHOLESTEROLEMIA    Hyperlipidemia    Hypertension    OBESITY    Occipital neuralgia    Palpitations    Rheumatoid arthritis (HCC) 12/24/2017    Allergies  Allergen Reactions   Ciprofloxacin     toxicity   Levaquin  [Levofloxacin ] Anxiety    Possible CNS side effects. TOXICITY   Moxifloxacin     TOXICITY   Avocado     Other reaction(s): Unknown     Review of Systems:  No headache, visual changes, nausea, vomiting, diarrhea, constipation, dizziness, abdominal pain, skin rash, fevers, chills, night sweats, weight loss, swollen lymph nodes, body aches, joint swelling, chest  pain, shortness of breath, mood changes. POSITIVE muscle aches  Objective  Blood pressure 118/70, pulse 89, height 5\' 11"  (1.803 m), weight 271 lb (122.9 kg), SpO2 97%.   General: No apparent distress alert and oriented x3 mood and affect normal, dressed appropriately.  HEENT: Pupils equal, extraocular movements intact  Respiratory: Patient's speak in full sentences and does not appear short of breath  Cardiovascular: No lower extremity edema, non tender, no erythema  Gait MSK:  Back does have some loss lordosis noted.  Some tenderness to palpation over the tenderness.  Patient does have some mild swelling over the [facility] joint.  Some discomfort noted over the sternal area right greater than left.  Osteopathic findings  C2 flexed rotated and side bent right C6 flexed rotated and side bent left T2 extended rotated and side bent right inhaled rib T5 extended rotated and side bent left L3 flexed rotated and side bent right Sacrum right on right       Assessment and Plan:  Rheumatoid arthritis (HCC) Some inflammation of the [place] joint, will monitor, no sign of infection.  Discussed colchicine   Increase activity  RTC in 6-8 weeks    Low back pain Chronic problem with some mild exacerbation as well.  Still attempting to potentially lose weight.  BMI is down to 37.  Encourage patient to get down under 30.  Patient has  done remarkably well at the moment.  Increase activity slowly.  Discussed icing regimen.  Follow-up again on 6 to 8 weeks otherwise.    Nonallopathic problems  Decision today to treat with OMT was based on Physical Exam  After verbal consent patient was treated with HVLA, ME, FPR techniques in cervical, rib, thoracic, lumbar, and sacral  areas  Patient tolerated the procedure well with improvement in symptoms  Patient given exercises, stretches and lifestyle modifications  See medications in patient instructions if given  Patient will follow up in 4-8 weeks     The above documentation has been reviewed and is accurate and complete Gean Laursen M Nezzie Manera, DO          Note: This dictation was prepared with Dragon dictation along with smaller phrase technology. Any transcriptional errors that result from this process are unintentional.

## 2024-03-25 ENCOUNTER — Ambulatory Visit: Admitting: Family Medicine

## 2024-03-25 ENCOUNTER — Other Ambulatory Visit: Payer: Self-pay

## 2024-03-25 ENCOUNTER — Other Ambulatory Visit (HOSPITAL_COMMUNITY): Payer: Self-pay

## 2024-03-25 ENCOUNTER — Encounter: Payer: Self-pay | Admitting: Family Medicine

## 2024-03-25 VITALS — BP 118/70 | HR 89 | Ht 71.0 in | Wt 271.0 lb

## 2024-03-25 DIAGNOSIS — M9903 Segmental and somatic dysfunction of lumbar region: Secondary | ICD-10-CM

## 2024-03-25 DIAGNOSIS — M9902 Segmental and somatic dysfunction of thoracic region: Secondary | ICD-10-CM | POA: Diagnosis not present

## 2024-03-25 DIAGNOSIS — M5441 Lumbago with sciatica, right side: Secondary | ICD-10-CM | POA: Diagnosis not present

## 2024-03-25 DIAGNOSIS — M069 Rheumatoid arthritis, unspecified: Secondary | ICD-10-CM

## 2024-03-25 DIAGNOSIS — M9904 Segmental and somatic dysfunction of sacral region: Secondary | ICD-10-CM

## 2024-03-25 DIAGNOSIS — M5442 Lumbago with sciatica, left side: Secondary | ICD-10-CM

## 2024-03-25 DIAGNOSIS — M9908 Segmental and somatic dysfunction of rib cage: Secondary | ICD-10-CM

## 2024-03-25 DIAGNOSIS — M9901 Segmental and somatic dysfunction of cervical region: Secondary | ICD-10-CM | POA: Diagnosis not present

## 2024-03-25 MED ORDER — COLCHICINE 0.6 MG PO TABS
0.6000 mg | ORAL_TABLET | Freq: Two times a day (BID) | ORAL | 0 refills | Status: DC
  Filled 2024-03-25: qty 10, 5d supply, fill #0

## 2024-03-25 NOTE — Assessment & Plan Note (Signed)
 Chronic problem with some mild exacerbation as well.  Still attempting to potentially lose weight.  BMI is down to 37.  Encourage patient to get down under 30.  Patient has done remarkably well at the moment.  Increase activity slowly.  Discussed icing regimen.  Follow-up again on 6 to 8 weeks otherwise.

## 2024-03-25 NOTE — Assessment & Plan Note (Signed)
 Some inflammation of the [place] joint, will monitor, no sign of infection.  Discussed colchicine   Increase activity  RTC in 6-8 weeks

## 2024-03-25 NOTE — Patient Instructions (Addendum)
 Good to see you! Colchicine  2x for 5 days Have fun at beach See you again in 5 weeks

## 2024-03-26 ENCOUNTER — Other Ambulatory Visit (HOSPITAL_COMMUNITY): Payer: Self-pay

## 2024-03-26 ENCOUNTER — Other Ambulatory Visit: Payer: Self-pay

## 2024-03-26 NOTE — Progress Notes (Signed)
 Specialty Pharmacy Refill Coordination Note  April [name] is a 48 y.o. female contacted today regarding refills of specialty medication(s) Cimzia .  Patient requested (Patient-Rptd) Delivery   Delivery date: (Patient-Rptd) 03/31/24   Verified address: (Patient-Rptd) [address] [place]  [phone]   Medication will be filled on 03/30/24.

## 2024-03-28 ENCOUNTER — Other Ambulatory Visit: Payer: Self-pay | Admitting: Internal Medicine

## 2024-03-28 DIAGNOSIS — I10 Essential (primary) hypertension: Secondary | ICD-10-CM

## 2024-03-29 ENCOUNTER — Other Ambulatory Visit: Payer: Self-pay

## 2024-03-30 ENCOUNTER — Other Ambulatory Visit (HOSPITAL_COMMUNITY): Payer: Self-pay

## 2024-03-30 MED ORDER — FENOFIBRATE 160 MG PO TABS
160.0000 mg | ORAL_TABLET | Freq: Every day | ORAL | 0 refills | Status: DC
  Filled 2024-03-30: qty 90, 90d supply, fill #0

## 2024-03-30 MED ORDER — LOSARTAN POTASSIUM 50 MG PO TABS
50.0000 mg | ORAL_TABLET | Freq: Every day | ORAL | 0 refills | Status: DC
  Filled 2024-03-30: qty 90, 90d supply, fill #0

## 2024-04-01 ENCOUNTER — Other Ambulatory Visit: Payer: Self-pay

## 2024-04-01 NOTE — Progress Notes (Signed)
 Clinical Intervention Note  Clinical Intervention Notes: Patient reports initiating colchicine . No DDIs identified with Cimzia .   Clinical Intervention Outcomes: Prevention of an adverse drug event   Rena Carnes Specialty Pharmacist

## 2024-04-26 ENCOUNTER — Other Ambulatory Visit: Payer: Self-pay

## 2024-04-26 ENCOUNTER — Other Ambulatory Visit (HOSPITAL_COMMUNITY): Payer: Self-pay

## 2024-04-26 ENCOUNTER — Encounter (INDEPENDENT_AMBULATORY_CARE_PROVIDER_SITE_OTHER): Payer: Self-pay

## 2024-04-26 ENCOUNTER — Other Ambulatory Visit: Payer: Self-pay | Admitting: Internal Medicine

## 2024-04-26 DIAGNOSIS — F341 Dysthymic disorder: Secondary | ICD-10-CM

## 2024-04-26 NOTE — Progress Notes (Signed)
 Specialty Pharmacy Refill Coordination Note  April [name] is a 48 y.o. female contacted today regarding refills of specialty medication(s) Certolizumab Pegol  (CIMZIA )   Patient requested Delivery   Delivery date: 04/27/24   Verified address: [address] [place] [address]   Medication will be filled on 04/26/24.

## 2024-04-28 ENCOUNTER — Other Ambulatory Visit (HOSPITAL_COMMUNITY): Payer: Self-pay

## 2024-04-28 ENCOUNTER — Encounter: Payer: Self-pay | Admitting: Internal Medicine

## 2024-04-28 MED ORDER — BUSPIRONE HCL 15 MG PO TABS
15.0000 mg | ORAL_TABLET | Freq: Two times a day (BID) | ORAL | 1 refills | Status: AC
  Filled 2024-04-28: qty 180, 90d supply, fill #0

## 2024-04-28 MED ORDER — MONTELUKAST SODIUM 10 MG PO TABS
10.0000 mg | ORAL_TABLET | Freq: Every day | ORAL | 0 refills | Status: DC
  Filled 2024-04-28: qty 90, 90d supply, fill #0

## 2024-04-28 NOTE — Telephone Encounter (Signed)
 LVM for patient to return call, overdue for F/U

## 2024-05-05 ENCOUNTER — Other Ambulatory Visit: Payer: Self-pay

## 2024-05-06 ENCOUNTER — Other Ambulatory Visit (HOSPITAL_COMMUNITY): Payer: Self-pay

## 2024-05-06 NOTE — Progress Notes (Signed)
 Clinical Intervention Note  Clinical Intervention Notes: Patient reported starting the suppliment Saffron.  There are no documented DDIs between Safron and Cimzia , though Saffron has limited studies regarding drug interactions.   Clinical Intervention Outcomes: Prevention of an adverse drug event   April [name]

## 2024-05-07 NOTE — Progress Notes (Unsigned)
  [facility] [address] [place] [address] Phone: [phone] Subjective:    I'm seeing this patient by the request  of:  Arcadio Knuckles, MD  CC:   [id number]  April [name] is a 48 y.o. female coming in with complaint of back and neck pain. OMT on 03/25/2024. Patient states   Medications patient has been prescribed: colchicine   Taking:         Reviewed prior external information including notes and imaging from previsou exam, outside providers and external EMR if available.   As well as notes that were available from care everywhere and other healthcare systems.  Past medical history, social, surgical and family history all reviewed in electronic medical record.  No pertanent information unless stated regarding to the chief complaint.   Past Medical History:  Diagnosis Date   ALLERGIC RHINITIS    Anal fissure    Anxiety    ANXIETY DEPRESSION    Back pain    CONSTIPATION    Constipation    DEPRESSION    Esophageal reflux    Food allergy    Avacados   Gastritis    GERD (gastroesophageal reflux disease)    HYPERCHOLESTEROLEMIA    Hyperlipidemia    Hypertension    OBESITY    Occipital neuralgia    Palpitations    Rheumatoid arthritis (HCC) 12/24/2017    Allergies  Allergen Reactions   Ciprofloxacin     toxicity   Levaquin  [Levofloxacin ] Anxiety    Possible CNS side effects. TOXICITY   Moxifloxacin     TOXICITY   Avocado     Other reaction(s): Unknown     Review of Systems:  No headache, visual changes, nausea, vomiting, diarrhea, constipation, dizziness, abdominal pain, skin rash, fevers, chills, night sweats, weight loss, swollen lymph nodes, body aches, joint swelling, chest pain, shortness of breath, mood changes. POSITIVE muscle aches  Objective  There were no vitals taken for this visit.   General: No apparent distress alert and oriented x3 mood and affect normal, dressed appropriately.  HEENT:  Pupils equal, extraocular movements intact  Respiratory: Patient's speak in full sentences and does not appear short of breath  Cardiovascular: No lower extremity edema, non tender, no erythema  Gait MSK:  Back   Osteopathic findings  C2 flexed rotated and side bent right C6 flexed rotated and side bent left T3 extended rotated and side bent right inhaled rib T9 extended rotated and side bent left L2 flexed rotated and side bent right Sacrum right on right       Assessment and Plan:  No problem-specific Assessment & Plan notes found for this encounter.    Nonallopathic problems  Decision today to treat with OMT was based on Physical Exam  After verbal consent patient was treated with HVLA, ME, FPR techniques in cervical, rib, thoracic, lumbar, and sacral  areas  Patient tolerated the procedure well with improvement in symptoms  Patient given exercises, stretches and lifestyle modifications  See medications in patient instructions if given  Patient will follow up in 4-8 weeks             Note: This dictation was prepared with Dragon dictation along with smaller phrase technology. Any transcriptional errors that result from this process are unintentional.

## 2024-05-12 ENCOUNTER — Encounter: Payer: Self-pay | Admitting: Family Medicine

## 2024-05-12 ENCOUNTER — Ambulatory Visit: Admitting: Family Medicine

## 2024-05-12 ENCOUNTER — Other Ambulatory Visit (HOSPITAL_COMMUNITY): Payer: Self-pay

## 2024-05-12 VITALS — BP 124/78 | HR 87 | Ht 71.0 in | Wt 271.0 lb

## 2024-05-12 DIAGNOSIS — M9903 Segmental and somatic dysfunction of lumbar region: Secondary | ICD-10-CM | POA: Diagnosis not present

## 2024-05-12 DIAGNOSIS — M654 Radial styloid tenosynovitis [de Quervain]: Secondary | ICD-10-CM

## 2024-05-12 DIAGNOSIS — M9901 Segmental and somatic dysfunction of cervical region: Secondary | ICD-10-CM

## 2024-05-12 DIAGNOSIS — M9904 Segmental and somatic dysfunction of sacral region: Secondary | ICD-10-CM | POA: Diagnosis not present

## 2024-05-12 DIAGNOSIS — M9902 Segmental and somatic dysfunction of thoracic region: Secondary | ICD-10-CM

## 2024-05-12 DIAGNOSIS — M5442 Lumbago with sciatica, left side: Secondary | ICD-10-CM | POA: Diagnosis not present

## 2024-05-12 DIAGNOSIS — M9908 Segmental and somatic dysfunction of rib cage: Secondary | ICD-10-CM

## 2024-05-12 DIAGNOSIS — M5441 Lumbago with sciatica, right side: Secondary | ICD-10-CM | POA: Diagnosis not present

## 2024-05-12 MED ORDER — TRIAMCINOLONE ACETONIDE 0.5 % EX CREA
1.0000 | TOPICAL_CREAM | Freq: Two times a day (BID) | CUTANEOUS | 3 refills | Status: DC
  Filled 2024-05-12: qty 30, 15d supply, fill #0

## 2024-05-12 NOTE — Assessment & Plan Note (Signed)
 Chronic problem with more tightness.  No radicular symptoms.  Responded well to osteopathic manipulation after further evaluation for other problems.  No sign of any rheumatoid arthritis flare at the moment of the neck.  No need for any type of prednisone .

## 2024-05-12 NOTE — Assessment & Plan Note (Signed)
 Patient is having a flare at the moment.  Did do very well for the last 6 months.  We discussed topical anti-inflammatories, short course which patient already has a prescription.  Going back to the brace day and night for 1 week and then expanding from there.  Follow-up with me again in 6 to 8 weeks and if worsening pain consider injection

## 2024-05-12 NOTE — Patient Instructions (Signed)
 Celebrex  2x a day for 5 days  Wear the brace day and night 1 week  Triamcinolone  was sent in  6-8 week follow up

## 2024-05-14 ENCOUNTER — Other Ambulatory Visit (HOSPITAL_COMMUNITY): Payer: Self-pay

## 2024-05-15 ENCOUNTER — Other Ambulatory Visit: Payer: Self-pay | Admitting: Internal Medicine

## 2024-05-17 ENCOUNTER — Other Ambulatory Visit (HOSPITAL_COMMUNITY): Payer: Self-pay

## 2024-05-17 ENCOUNTER — Other Ambulatory Visit (HOSPITAL_BASED_OUTPATIENT_CLINIC_OR_DEPARTMENT_OTHER): Payer: Self-pay

## 2024-05-17 MED ORDER — ESCITALOPRAM OXALATE 20 MG PO TABS
20.0000 mg | ORAL_TABLET | Freq: Every day | ORAL | 0 refills | Status: DC
  Filled 2024-05-17: qty 90, 90d supply, fill #0

## 2024-05-20 ENCOUNTER — Encounter: Payer: Self-pay | Admitting: Internal Medicine

## 2024-05-20 ENCOUNTER — Ambulatory Visit: Admitting: Internal Medicine

## 2024-05-20 ENCOUNTER — Other Ambulatory Visit (HOSPITAL_COMMUNITY): Payer: Self-pay

## 2024-05-20 ENCOUNTER — Other Ambulatory Visit: Payer: Self-pay

## 2024-05-20 VITALS — BP 128/82 | HR 86 | Temp 98.1°F | Resp 16 | Ht 71.0 in | Wt 270.8 lb

## 2024-05-20 DIAGNOSIS — K2100 Gastro-esophageal reflux disease with esophagitis, without bleeding: Secondary | ICD-10-CM | POA: Diagnosis not present

## 2024-05-20 DIAGNOSIS — F5104 Psychophysiologic insomnia: Secondary | ICD-10-CM

## 2024-05-20 DIAGNOSIS — I10 Essential (primary) hypertension: Secondary | ICD-10-CM | POA: Diagnosis not present

## 2024-05-20 DIAGNOSIS — L709 Acne, unspecified: Secondary | ICD-10-CM

## 2024-05-20 DIAGNOSIS — N951 Menopausal and female climacteric states: Secondary | ICD-10-CM

## 2024-05-20 DIAGNOSIS — F331 Major depressive disorder, recurrent, moderate: Secondary | ICD-10-CM

## 2024-05-20 DIAGNOSIS — R7303 Prediabetes: Secondary | ICD-10-CM | POA: Diagnosis not present

## 2024-05-20 MED ORDER — CLINDAMYCIN PHOSPHATE 1 % EX SOLN
Freq: Two times a day (BID) | CUTANEOUS | 3 refills | Status: DC
  Filled 2024-05-20: qty 60, 30d supply, fill #0

## 2024-05-20 MED ORDER — VOQUEZNA 10 MG PO TABS: 1.0000 | ORAL_TABLET | Freq: Every day | ORAL | 1 refills | Status: DC

## 2024-05-20 MED ORDER — ZOLPIDEM TARTRATE 5 MG PO TABS
5.0000 mg | ORAL_TABLET | Freq: Every evening | ORAL | 0 refills | Status: AC | PRN
  Filled 2024-05-20: qty 90, 90d supply, fill #0

## 2024-05-20 MED ORDER — BREXPIPRAZOLE 0.25 MG PO TABS
0.2500 mg | ORAL_TABLET | Freq: Every day | ORAL | 0 refills | Status: DC
  Filled 2024-05-20: qty 30, 30d supply, fill #0

## 2024-05-20 NOTE — Progress Notes (Signed)
 Subjective:  [name], female    DOB: Oct 17, 1976  Age: 48 y.o. MRN: [id number]  CC: Medical Management of Chronic Issues (Medication refill (ambien , clindamycin  topical solution), Increased anxiety for the past year (believes due to perimenopause, also experiencing symtpoms related to this). More urinary urge incontinence, breakthrough GERD, night sweats, irritability, changes in mood.), Gastroesophageal Reflux, Hypertension, and Depression   HPI Ledonna S Lycan presents for f/up ---  Discussed the use of AI scribe software for clinical note transcription with the patient, who gave verbal consent to proceed.  History of Present Illness   April [name] is a 48 year old female who presents with anxiety, irritability, and mood swings, suspecting perimenopause.  She experiences significant anxiety, irritability, and mood swings, which have worsened over the past year. The anxiety has been present for a year, while the irritability and mood swings are more recent. She also experiences night sweats but denies hot flashes.  She has a history of depression since 2000, with several major depressive episodes. Her depression is currently cyclic, with episodes of increased sadness, hopelessness, frustration, and irritability. She experiences apathy and difficulty falling asleep, though she sleeps well once asleep. There is no excessive sleep or changes in appetite, but she notes slight weight gain.  She is currently taking Lexapro  20 mg daily and uses Buspar  30 mg as needed, approximately once every two weeks. She is cautious about adding new medications due to concerns about weight gain and her borderline diabetes.  She has rheumatoid arthritis and is currently experiencing tendonitis in her wrist. She saw Dr. Claudene in the sports medicine clinic last week. 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  She occasionally uses an albuterol  inhaler for cat and dog allergies  and takes montelukast . She reports no recent issues with coughing, wheezing, shortness of breath, or chest pain.       Outpatient Medications Prior to Visit  Medication Sig Dispense Refill   albuterol  (VENTOLIN  HFA) 108 (90 Base) MCG/ACT inhaler Inhale 2 puffs into the lungs every 6 (six) hours as needed for wheezing or shortness of breath. 6.7 g 0   ascorbic acid (VITAMIN C) 1000 MG tablet Take 1,000 mg by mouth daily.     Bacillus Coagulans-Inulin (PROBIOTIC) 1-250 BILLION-MG CAPS      busPIRone  (BUSPAR ) 15 MG tablet Take 1 tablet (15 mg total) by mouth 2 (two) times daily. 180 tablet 1   celecoxib  (CELEBREX ) 200 MG capsule Take 1 capsule (200 mg total) by mouth 2 (two) times daily as needed with food 180 capsule 1   certolizumab pegol  (CIMZIA ) 200 MG/ML prefilled syringe Inject 200 mg into the skin every other week 1 each 5   cetirizine  (ZYRTEC ) 10 MG tablet Take 1 tablet (10 mg total) by mouth daily. 30 tablet 3   Cholecalciferol (VITAMIN D ) 50 MCG (2000 UT) CAPS Take 1 capsule by mouth daily.     clindamycin  1 MG/0.1 mL SOLN As needed     clobetasol  ointment (TEMOVATE ) 0.05 % as needed.  0   colchicine  0.6 MG tablet Take 1 tablet (0.6 mg total) by mouth 2 (two) times daily. 10 tablet 0   escitalopram  (LEXAPRO ) 20 MG tablet Take 1 tablet (20 mg total) by mouth daily. 90 tablet 0   esomeprazole  (NEXIUM ) 40 MG capsule Take 1 capsule (40 mg total) by mouth daily. 90 capsule 1   etonogestrel -ethinyl estradiol  (NUVARING) 0.12-0.015 MG/24HR vaginal ring Place 1 ring vaginally every 28 (twenty-eight) days using  continuously. 3 each 4   famotidine  (PEPCID ) 20 MG tablet Take 20 mg by mouth at bedtime. Take 1 tablets by mouth daily at bedtime     fenofibrate  160 MG tablet Take 1 tablet (160 mg total) by mouth daily. 90 tablet 0   folic acid  (FOLVITE ) 1 MG tablet Take 1 tablet (1 mg total) by mouth daily. 90 tablet 3   ibuprofen  (ADVIL ) 800 MG tablet Take 1 tablet (800 mg total) by mouth every 8  (eight) hours as needed. 90 tablet 0   losartan  (COZAAR ) 50 MG tablet Take 1 tablet (50 mg total) by mouth daily. 90 tablet 0   MAGNESIUM  MALATE PO Take 1 tablet by mouth at bedtime.     methotrexate  50 MG/2ML injection Inject 1 mL (25 mg total) into the skin every 7 (seven) days. 12 mL 0   montelukast  (SINGULAIR ) 10 MG tablet Take 1 tablet (10 mg total) by mouth at bedtime. 90 tablet 0   Multiple Vitamin (MULTIVITAMIN) tablet Take 1 tablet by mouth daily.     mupirocin ointment (BACTROBAN) 2 % Apply 1 Application topically 2 (two) times daily as needed.     omega-3 acid ethyl esters (LOVAZA ) 1 g capsule Take 2 capsules (2 g total) by mouth 2 (two) times daily. 360 capsule 1   triamcinolone  cream (KENALOG ) 0.5 % Apply 1 Application topically 2 (two) times daily. To affected areas. 30 g 3   zolpidem  (AMBIEN ) 5 MG tablet Take 1 tablet (5 mg total) by mouth at bedtime as needed for sleep. 30 tablet 0   chlorpheniramine-HYDROcodone (TUSSIONEX) 10-8 MG/5ML Take 5 mLs by mouth every 12 (twelve) hours as needed. 115 mL 0   EPINEPHrine (PRIMATENE MIST) 0.125 MG/ACT AERO      No facility-administered medications prior to visit.    ROS Review of Systems  Constitutional: Negative.  Negative for chills, diaphoresis, fatigue and fever.  HENT: Negative.  Negative for trouble swallowing.        She has heartburn that is not controlled with a PPI and H2 blocker  Eyes: Negative.   Respiratory:  Negative for cough, chest tightness, shortness of breath and wheezing.   Cardiovascular:  Negative for chest pain, palpitations and leg swelling.  Gastrointestinal: Negative.  Negative for abdominal pain, constipation, diarrhea, nausea and vomiting.  Endocrine: Negative.   Genitourinary: Negative.  Negative for difficulty urinating.  Musculoskeletal:  Positive for arthralgias. Negative for joint swelling and myalgias.  Skin: Negative.   Neurological: Negative.  Negative for dizziness, weakness and  light-headedness.  Hematological:  Negative for adenopathy. Does not bruise/bleed easily.  Psychiatric/Behavioral:  Positive for dysphoric mood. Negative for agitation, behavioral problems, confusion, decreased concentration, hallucinations, self-injury, sleep disturbance and suicidal ideas. The patient is nervous/anxious.     Objective:  BP 128/82 (BP Location: Left Arm, Patient Position: Sitting)   Pulse 86   Temp 98.1 F (36.7 C) (Oral)   Resp 16   Ht 5' 11 (1.803 m)   Wt 270 lb 12.8 oz (122.8 kg)   SpO2 97%   BMI 37.77 kg/m   BP Readings from Last 3 Encounters:  05/20/24 128/82  05/12/24 124/78  03/25/24 118/70    Wt Readings from Last 3 Encounters:  05/20/24 270 lb 12.8 oz (122.8 kg)  05/12/24 271 lb (122.9 kg)  03/25/24 271 lb (122.9 kg)    Physical Exam Vitals reviewed.  Constitutional:      Appearance: Normal appearance.  HENT:     Nose: Nose normal.  Mouth/Throat:     Mouth: Mucous membranes are moist.  Eyes:     General: No scleral icterus.    Conjunctiva/sclera: Conjunctivae normal.  Cardiovascular:     Rate and Rhythm: Normal rate and regular rhythm.     Heart sounds: No murmur heard.    No friction rub. No gallop.  Pulmonary:     Effort: Pulmonary effort is normal.     Breath sounds: No stridor. No wheezing, rhonchi or rales.  Abdominal:     General: Abdomen is flat.     Palpations: There is no mass.     Tenderness: There is no abdominal tenderness. There is no guarding.     Hernia: No hernia is present.  Musculoskeletal:        General: Normal range of motion.     Cervical back: Neck supple.     Right lower leg: No edema.     Left lower leg: No edema.  Lymphadenopathy:     Cervical: No cervical adenopathy.  Skin:    General: Skin is warm and dry.  Neurological:     General: No focal deficit present.     Mental Status: She is alert. Mental status is at baseline.  Psychiatric:        Attention and Perception: Attention normal.         Mood and Affect: Mood normal.        Speech: Speech normal.        Behavior: Behavior normal.        Thought Content: Thought content normal.        Cognition and Memory: Cognition normal.        Judgment: Judgment normal.     Lab Results  Component Value Date   WBC 6.5 09/04/2023   HGB 13.2 09/04/2023   HCT 40 09/04/2023   PLT 426 (A) 09/04/2023   GLUCOSE 83 09/17/2022   CHOL 230 (H) 11/20/2023   TRIG 129.0 11/20/2023   HDL 79.60 11/20/2023   LDLDIRECT 96.0 05/26/2019   LDLCALC 124 (H) 11/20/2023   ALT 18 09/04/2023   AST 17 09/04/2023   NA 139 09/04/2023   K 4.1 09/04/2023   CL 104 09/04/2023   CREATININE 0.7 09/04/2023   BUN 11 09/04/2023   CO2 21 09/04/2023   TSH 1.71 11/20/2023   HGBA1C 5.8 11/20/2023    No results found.  Assessment & Plan:  Prediabetes -     Hemoglobin A1c; Future  Psychophysiological insomnia -     Zolpidem  Tartrate; Take 1 tablet (5 mg total) by mouth at bedtime as needed for sleep.  Dispense: 90 tablet; Refill: 0 -     Basic metabolic panel with GFR; Future  Major depressive disorder, recurrent episode, moderate (HCC)- Will add an atypical antipsychotic. -     Brexpiprazole ; Take 1 tablet (0.25 mg total) by mouth daily.  Dispense: 30 tablet; Refill: 0  Gastroesophageal reflux disease with esophagitis without hemorrhage -     Voquezna ; Take 1 tablet by mouth daily.  Dispense: 90 tablet; Refill: 1 -     CBC with Differential/Platelet; Future  Benign essential hypertension- BP is well controlled. -     Basic metabolic panel with GFR; Future  Perimenopausal symptoms -     FSH/LH; Future  Acne, unspecified acne type -     Clindamycin  Phosphate; Apply topically 2 (two) times daily.  Dispense: 60 mL; Refill: 3     Follow-up: Return in about 6 months (around 11/20/2024).  Debby  Joshua, MD

## 2024-05-20 NOTE — Patient Instructions (Signed)
 GERD in Adults: What to Know  Gastroesophageal reflux (GER) is when acid from your stomach flows up into your esophagus. Your esophagus is the part of your body that moves food from your mouth to your stomach. Normally, food goes down and stays in your stomach to be digested. But with GER, food and stomach acid may go back up. You may have a disease called gastroesophageal reflux disease (GERD) if the reflux: Happens often. Causes very bad symptoms. Makes your esophagus sore and swollen. Over time, GERD can make small holes called ulcers in the lining of your esophagus. What are the causes? GERD is caused by a problem with the muscle between your esophagus and stomach. This muscle is called the lower esophageal sphincter (LES). When it's weak or not normal, it doesn't close like it should. This means food and stomach acid can go back up into your esophagus. The muscle can be weak if: You smoke or use products with tobacco in them. You're pregnant. You have a type of hernia called a hiatal hernia. You eat certain foods and drinks. These include: Alcohol. Coffee. Chocolate. Onions. Peppermint. What increases the risk? Being overweight. Having a disease that affects your connective tissue. Taking NSAIDs, such as ibuprofen. What are the signs or symptoms? Heartburn. Trouble swallowing. Pain when you swallow. The feeling of having a lump in your throat. A bitter taste in your mouth. Bad breath. Having an upset or bloated stomach. Burping. Chest pain. Other conditions can also cause chest pain. Make sure you see your health care provider if you have chest pain. Wheezing. This is when you make high-pitched whistling sounds when you breathe, most often when you breathe out. A long-term cough or a cough at night. How is this diagnosed? GERD may be diagnosed based on your medical history and a physical exam. You may also have tests. These may include: An endoscopy. This test looks at your  stomach and esophagus with a small camera. A barium swallow test. This shows the shape and size of your esophagus and how well it's working. Tests of your esophagus to check for: Acid levels. Pressure. How is this treated? Treatment may depend on how bad your symptoms are. It may include: Changes to your diet and daily life. Medicines. Surgery. Follow these instructions at home: Eating and drinking Follow an eating plan as told by your provider. You may need to avoid certain foods and drinks. These may include: Coffee and tea, with or without caffeine. Alcohol. Energy drinks and sports drinks. Fizzy drinks or sodas. Chocolate and cocoa. Peppermint and mint flavorings. Garlic and onions. Horseradish. Spicy and acidic foods. These include: Peppers. Chili powder and curry powder. Vinegar. Hot sauces and BBQ sauce. Citrus fruits and juices. These include: Oranges. Lemons. Limes. Tomato-based foods. These include: Red sauce and pizza with red sauce. Chili. Salsa. Fried and fatty foods. These include: Donuts. [place] fries. Potato chips. High-fat dressings. High-fat meats. These include: Hot dogs and sausage. Rib eye steak. Ham and bacon. High-fat dairy items. These include: Whole milk. Butter. Cream cheese. Eat small meals often. Avoid eating big meals. Avoid drinking lots of liquid with your meals. Try not to eat meals during the 2-3 hours before bedtime. Try not to lie down right after you eat. Do not exercise right after you eat. Lifestyle  If you're overweight, lose an amount of weight that's healthy for you. Ask your provider about a safe weight loss goal. Do not smoke, vape, or use nicotine or tobacco. Wear  loose clothes. Do not wear things that are tight around your waist. When you sleep, try: Raising the head of your bed about 6 inches (15 cm). You can use a wedge to do this. Lying down on your left side. Try to lower your stress. If you need help doing  this, ask your provider. General instructions Take your medicines only as told. Do not take aspirin or ibuprofen unless you're told to. Watch for any changes in your symptoms. Do not bend over if it makes your symptoms worse. Contact a health care provider if: You have new symptoms. You have trouble: Drinking. Swallowing. Eating. It hurts to swallow. You have wheezing. You have a cough that won't go away. Your voice is hoarse. Your symptoms don't get better with treatment. Get help right away if: You have pain all of a sudden in your: Arm. Neck. Jaw. Teeth. Back. You feel sweaty, dizzy, or light-headed all of a sudden. You faint. You have chest pain or shortness of breath. You vomit and the vomit is: Green, yellow, or black. Looks like blood or coffee grounds. Your poop is red, bloody, or black. These symptoms may be an emergency. Call 911 right away. Do not wait to see if the symptoms will go away. Do not drive yourself to the hospital. This information is not intended to replace advice given to you by your health care provider. Make sure you discuss any questions you have with your health care provider. Document Revised: 09/16/2023 Document Reviewed: 04/02/2023 Elsevier Patient Education  2024 [employer].

## 2024-05-22 ENCOUNTER — Other Ambulatory Visit (HOSPITAL_COMMUNITY): Payer: Self-pay

## 2024-05-24 ENCOUNTER — Other Ambulatory Visit: Payer: Self-pay

## 2024-05-24 ENCOUNTER — Other Ambulatory Visit: Payer: Self-pay | Admitting: Pharmacy Technician

## 2024-05-24 ENCOUNTER — Other Ambulatory Visit (INDEPENDENT_AMBULATORY_CARE_PROVIDER_SITE_OTHER)

## 2024-05-24 ENCOUNTER — Encounter (INDEPENDENT_AMBULATORY_CARE_PROVIDER_SITE_OTHER): Payer: Self-pay

## 2024-05-24 DIAGNOSIS — I10 Essential (primary) hypertension: Secondary | ICD-10-CM

## 2024-05-24 DIAGNOSIS — F5104 Psychophysiologic insomnia: Secondary | ICD-10-CM

## 2024-05-24 DIAGNOSIS — R7303 Prediabetes: Secondary | ICD-10-CM | POA: Diagnosis not present

## 2024-05-24 DIAGNOSIS — K2100 Gastro-esophageal reflux disease with esophagitis, without bleeding: Secondary | ICD-10-CM

## 2024-05-24 DIAGNOSIS — N951 Menopausal and female climacteric states: Secondary | ICD-10-CM

## 2024-05-24 LAB — BASIC METABOLIC PANEL WITH GFR
BUN: 12 mg/dL (ref 6–23)
CO2: 26 meq/L (ref 19–32)
Calcium: 9.9 mg/dL (ref 8.4–10.5)
Chloride: 104 meq/L (ref 96–112)
Creatinine, Ser: 0.72 mg/dL (ref 0.40–1.20)
GFR: 99.55 mL/min (ref >60.00)
Glucose, Bld: 138 mg/dL — ABNORMAL HIGH (ref 70–99)
Potassium: 3.9 meq/L (ref 3.5–5.1)
Sodium: 137 meq/L (ref 135–145)

## 2024-05-24 LAB — CBC WITH DIFFERENTIAL/PLATELET
Basophils Absolute: 0.1 K/uL (ref 0.0–0.1)
Basophils Relative: 0.9 % (ref 0.0–3.0)
Eosinophils Absolute: 0.5 K/uL (ref 0.0–0.7)
Eosinophils Relative: 6.1 % — ABNORMAL HIGH (ref 0.0–5.0)
HCT: 37.7 % (ref 36.0–46.0)
Hemoglobin: 12.8 g/dL (ref 12.0–15.0)
Lymphocytes Relative: 34.3 % (ref 12.0–46.0)
Lymphs Abs: 2.7 K/uL (ref 0.7–4.0)
MCHC: 34 g/dL (ref 30.0–36.0)
MCV: 88.5 fl (ref 78.0–100.0)
Monocytes Absolute: 0.6 K/uL (ref 0.1–1.0)
Monocytes Relative: 8.1 % (ref 3.0–12.0)
Neutro Abs: 3.9 K/uL (ref 1.4–7.7)
Neutrophils Relative %: 50.6 % (ref 43.0–77.0)
Platelets: 361 K/uL (ref 150.0–400.0)
RBC: 4.25 Mil/uL (ref 3.87–5.11)
RDW: 14.3 % (ref 11.5–15.5)
WBC: 7.8 K/uL (ref 4.0–10.5)

## 2024-05-24 LAB — FSH/LH
FSH: 9.8 m[IU]/mL
LH: 7.6 m[IU]/mL

## 2024-05-24 LAB — HEMOGLOBIN A1C: Hgb A1c MFr Bld: 5.9 % (ref 4.6–6.5)

## 2024-05-24 NOTE — Progress Notes (Signed)
 Specialty Pharmacy Refill Coordination Note  April [name] is a 48 y.o. female contacted today regarding refills of specialty medication(s) Certolizumab Pegol  (CIMZIA )   Patient requested (Patient-Rptd) Delivery   Delivery date: 05/27/2024 Verified address: (Patient-Rptd) [address] [place] [address]   Medication will be filled on 05/26/2024.

## 2024-05-25 ENCOUNTER — Other Ambulatory Visit (HOSPITAL_COMMUNITY): Payer: Self-pay

## 2024-05-25 ENCOUNTER — Other Ambulatory Visit: Payer: Self-pay

## 2024-05-25 ENCOUNTER — Ambulatory Visit: Payer: Self-pay | Admitting: Internal Medicine

## 2024-05-25 MED ORDER — METHOTREXATE SODIUM CHEMO INJECTION 50 MG/2ML
25.0000 mg | INTRAMUSCULAR | 0 refills | Status: DC
  Filled 2024-05-25: qty 12, 84d supply, fill #0
  Filled 2024-08-18: qty 12, 84d supply, fill #1

## 2024-05-26 ENCOUNTER — Encounter: Payer: Self-pay | Admitting: Internal Medicine

## 2024-05-27 ENCOUNTER — Other Ambulatory Visit (HOSPITAL_COMMUNITY): Payer: Self-pay

## 2024-05-27 ENCOUNTER — Telehealth (HOSPITAL_COMMUNITY): Payer: Self-pay

## 2024-05-27 ENCOUNTER — Other Ambulatory Visit: Payer: Self-pay

## 2024-05-27 ENCOUNTER — Other Ambulatory Visit: Payer: Self-pay | Admitting: Internal Medicine

## 2024-05-27 DIAGNOSIS — K2100 Gastro-esophageal reflux disease with esophagitis, without bleeding: Secondary | ICD-10-CM

## 2024-05-27 MED ORDER — VOQUEZNA 10 MG PO TABS: 1.0000 | ORAL_TABLET | Freq: Every day | ORAL | 1 refills | Status: AC

## 2024-05-27 MED ORDER — VOQUEZNA 10 MG PO TABS
1.0000 | ORAL_TABLET | Freq: Every day | ORAL | 1 refills | Status: DC
  Filled 2024-05-27: qty 90, 90d supply, fill #0

## 2024-05-27 NOTE — Telephone Encounter (Signed)
 PA request has been Received. New Encounter has been or will be created for follow up. For additional info see Pharmacy Prior Auth telephone encounter from 05/27/24.

## 2024-05-27 NOTE — Telephone Encounter (Signed)
 Pharmacy Patient Advocate Encounter   Received notification from Pt Calls Messages that prior authorization for Voquezna  10MG  tablets  is required/requested.   Insurance verification completed.   The patient is insured through [facility] .   Per test claim: PA required; PA submitted to above mentioned insurance via CoverMyMeds Key/confirmation #/EOC [facility] Status is pending

## 2024-06-02 DIAGNOSIS — R5383 Other fatigue: Secondary | ICD-10-CM | POA: Diagnosis not present

## 2024-06-02 DIAGNOSIS — L409 Psoriasis, unspecified: Secondary | ICD-10-CM | POA: Diagnosis not present

## 2024-06-02 DIAGNOSIS — L732 Hidradenitis suppurativa: Secondary | ICD-10-CM | POA: Diagnosis not present

## 2024-06-02 DIAGNOSIS — M1991 Primary osteoarthritis, unspecified site: Secondary | ICD-10-CM | POA: Diagnosis not present

## 2024-06-02 DIAGNOSIS — Z6841 Body Mass Index (BMI) 40.0 and over, adult: Secondary | ICD-10-CM | POA: Diagnosis not present

## 2024-06-02 DIAGNOSIS — M545 Low back pain, unspecified: Secondary | ICD-10-CM | POA: Diagnosis not present

## 2024-06-02 DIAGNOSIS — Z79899 Other long term (current) drug therapy: Secondary | ICD-10-CM | POA: Diagnosis not present

## 2024-06-02 DIAGNOSIS — M0579 Rheumatoid arthritis with rheumatoid factor of multiple sites without organ or systems involvement: Secondary | ICD-10-CM | POA: Diagnosis not present

## 2024-06-02 DIAGNOSIS — M461 Sacroiliitis, not elsewhere classified: Secondary | ICD-10-CM | POA: Diagnosis not present

## 2024-06-02 NOTE — Telephone Encounter (Signed)
 Pharmacy Patient Advocate Encounter  Received notification from [employer] that Prior Authorization for Voquezna  10MG  tablets  has been DENIED.  See denial reason below. No denial letter attached in CMM. Will attach denial letter to Media tab once received.   PA #/Case ID/Reference #: [id number]

## 2024-06-03 NOTE — Telephone Encounter (Signed)
 This was approved through [employer] for $50 and she's okay with that.

## 2024-06-07 DIAGNOSIS — Z0289 Encounter for other administrative examinations: Secondary | ICD-10-CM

## 2024-06-08 ENCOUNTER — Other Ambulatory Visit (HOSPITAL_COMMUNITY): Payer: Self-pay

## 2024-06-09 ENCOUNTER — Encounter: Payer: Self-pay | Admitting: Internal Medicine

## 2024-06-09 ENCOUNTER — Other Ambulatory Visit: Payer: Self-pay | Admitting: Internal Medicine

## 2024-06-09 DIAGNOSIS — F331 Major depressive disorder, recurrent, moderate: Secondary | ICD-10-CM

## 2024-06-09 DIAGNOSIS — E781 Pure hyperglyceridemia: Secondary | ICD-10-CM

## 2024-06-11 ENCOUNTER — Other Ambulatory Visit: Payer: Self-pay | Admitting: Internal Medicine

## 2024-06-11 ENCOUNTER — Other Ambulatory Visit: Payer: Self-pay

## 2024-06-11 ENCOUNTER — Other Ambulatory Visit (HOSPITAL_COMMUNITY): Payer: Self-pay

## 2024-06-11 DIAGNOSIS — F331 Major depressive disorder, recurrent, moderate: Secondary | ICD-10-CM

## 2024-06-11 MED ORDER — REXULTI 0.5 MG PO TABS
1.0000 | ORAL_TABLET | Freq: Every day | ORAL | 0 refills | Status: DC
  Filled 2024-06-11: qty 90, 90d supply, fill #0

## 2024-06-11 MED ORDER — OMEGA-3-ACID ETHYL ESTERS 1 G PO CAPS
2.0000 g | ORAL_CAPSULE | Freq: Two times a day (BID) | ORAL | 1 refills | Status: AC
  Filled 2024-06-11: qty 360, 90d supply, fill #0
  Filled 2024-09-11: qty 360, 90d supply, fill #1

## 2024-06-12 ENCOUNTER — Other Ambulatory Visit (HOSPITAL_COMMUNITY): Payer: Self-pay

## 2024-06-16 ENCOUNTER — Other Ambulatory Visit: Payer: Self-pay | Admitting: Internal Medicine

## 2024-06-16 ENCOUNTER — Other Ambulatory Visit: Payer: Self-pay

## 2024-06-17 ENCOUNTER — Encounter (INDEPENDENT_AMBULATORY_CARE_PROVIDER_SITE_OTHER): Payer: Self-pay

## 2024-06-17 ENCOUNTER — Other Ambulatory Visit: Payer: Self-pay

## 2024-06-17 NOTE — Progress Notes (Signed)
 Specialty Pharmacy Refill Coordination Note  April [name] is a 48 y.o. female contacted today regarding refills of specialty medication(s) Certolizumab Pegol  (CIMZIA )   Patient requested (Patient-Rptd) Delivery   Delivery date: 06/22/24   Verified address: (Patient-Rptd) [address] [place] [address]   Medication will be filled on 06/21/24, pending refill approval.   Refill request sent to A. [facility]. Send to [place] for rewrite.

## 2024-06-17 NOTE — Progress Notes (Signed)
 Clinical Intervention Note  Clinical Intervention Notes: Patient reported starting Voquezna  and Rexulti , no DDIs were identified with her Cimzia .   Clinical Intervention Outcomes: Prevention of an adverse drug event   Silvano LOISE Blair Karel Santa

## 2024-06-21 ENCOUNTER — Other Ambulatory Visit (HOSPITAL_COMMUNITY): Payer: Self-pay

## 2024-06-21 ENCOUNTER — Other Ambulatory Visit: Payer: Self-pay

## 2024-06-21 ENCOUNTER — Other Ambulatory Visit: Payer: Self-pay | Admitting: Pharmacist

## 2024-06-21 MED ORDER — CERTOLIZUMAB PEGOL 200 MG/ML ~~LOC~~ PSKT
200.0000 mg | PREFILLED_SYRINGE | SUBCUTANEOUS | 5 refills | Status: DC
  Filled 2024-06-21: qty 2, 28d supply, fill #0

## 2024-06-21 MED ORDER — CERTOLIZUMAB PEGOL 200 MG/ML ~~LOC~~ PSKT
200.0000 mg | PREFILLED_SYRINGE | SUBCUTANEOUS | 5 refills | Status: AC
  Filled 2024-06-21: qty 2, 28d supply, fill #0
  Filled 2024-07-20: qty 2, 28d supply, fill #1
  Filled 2024-08-25 (×2): qty 2, 28d supply, fill #2
  Filled 2024-09-16: qty 2, 28d supply, fill #3
  Filled 2024-10-19: qty 2, 28d supply, fill #4
  Filled 2024-11-24 – 2024-11-25 (×3): qty 2, 28d supply, fill #5

## 2024-06-22 ENCOUNTER — Other Ambulatory Visit: Payer: Self-pay

## 2024-06-22 ENCOUNTER — Other Ambulatory Visit (HOSPITAL_COMMUNITY): Payer: Self-pay

## 2024-06-25 ENCOUNTER — Other Ambulatory Visit: Payer: Self-pay | Admitting: Internal Medicine

## 2024-06-25 DIAGNOSIS — I10 Essential (primary) hypertension: Secondary | ICD-10-CM

## 2024-06-28 ENCOUNTER — Other Ambulatory Visit (HOSPITAL_BASED_OUTPATIENT_CLINIC_OR_DEPARTMENT_OTHER): Payer: Self-pay

## 2024-06-28 ENCOUNTER — Other Ambulatory Visit (HOSPITAL_COMMUNITY): Payer: Self-pay

## 2024-06-28 MED ORDER — FENOFIBRATE 160 MG PO TABS
160.0000 mg | ORAL_TABLET | Freq: Every day | ORAL | 0 refills | Status: DC
  Filled 2024-06-28: qty 90, 90d supply, fill #0

## 2024-06-28 MED ORDER — LOSARTAN POTASSIUM 50 MG PO TABS
50.0000 mg | ORAL_TABLET | Freq: Every day | ORAL | 0 refills | Status: DC
  Filled 2024-06-28: qty 90, 90d supply, fill #0

## 2024-07-07 NOTE — Progress Notes (Signed)
 [facility] [address] [place] [address] Phone: [phone] Subjective:   April [name], am serving as a scribe for Dr. Arthea Claudene.  I'm seeing this patient by the request  of:  Joshua Debby CROME, MD  CC: Back and neck pain follow-up  YEP:Dlagzrupcz  April [name] is a 48 y.o. female coming in with complaint of back and neck pain. OMT on 05/12/2024. Patient states that she is achy in wrist, hands, and elbows.     Medications patient has been prescribed:   Taking:       Reviewed prior external information including notes and imaging from previsou exam, outside providers and external EMR if available.   As well as notes that were available from care everywhere and other healthcare systems.  Past medical history, social, surgical and family history all reviewed in electronic medical record.  No pertanent information unless stated regarding to the chief complaint.   Past Medical History:  Diagnosis Date   ALLERGIC RHINITIS    Anal fissure    Anxiety    ANXIETY DEPRESSION    Back pain    CONSTIPATION    Constipation    DEPRESSION    Esophageal reflux    Food allergy    Avacados   Gastritis    GERD (gastroesophageal reflux disease)    HYPERCHOLESTEROLEMIA    Hyperlipidemia    Hypertension    OBESITY    Occipital neuralgia    Palpitations    Rheumatoid arthritis (HCC) 12/24/2017    Allergies  Allergen Reactions   Ciprofloxacin     toxicity   Levaquin  [Levofloxacin ] Anxiety    Possible CNS side effects. TOXICITY   Moxifloxacin     TOXICITY   Avocado     Other reaction(s): Unknown     Review of Systems:  No headache, visual changes, nausea, vomiting, diarrhea, constipation, dizziness, abdominal pain, skin rash, fevers, chills, night sweats, weight loss, swollen lymph nodes, body aches, joint swelling, chest pain, shortness of breath, mood changes. POSITIVE muscle aches  Objective  Blood pressure 128/82,  pulse 85, height 5' 11 (1.803 m), SpO2 95%.   General: No apparent distress alert and oriented x3 mood and affect normal, dressed appropriately.  HEENT: Pupils equal, extraocular movements intact  Respiratory: Patient's speak in full sentences and does not appear short of breath  Cardiovascular: No lower extremity edema, non tender, no erythema  Gait MSK:  Back mild loss lordosis.  Some mild increase in kyphosis that appears of the thoracic spine.  Patient does have tightness noted around the right and sacral area.  Also has some very mild swelling over the [facility] joint on the right side.  Tightness noted in the SCM musculature.  Osteopathic findings  C4 flexed rotated and side bent right T4 extended rotated and side bent right inhaled rib T7 extended rotated and side bent right L1 flexed rotated and side bent right L3 flexed rotated and side bent left Sacrum right on right     Assessment and Plan:  Rheumatoid arthritis (HCC) Continue to do relatively well.  Did have significant tightness noted on the right side of the neck.  Mild swelling noted over the [facility] joint.  Discussed icing regimen and home exercises.  Discussed which activities to do.  No change in medications.  Follow-up again in 6 to 8 weeks  SI (sacroiliac) joint dysfunction Sacroiliac dysfunction noted.  Discussed icing regimen and home exercises, discussed which activities to do and which  ones to avoid.  Increase activity slowly.  Continue work on core strength.    Nonallopathic problems  Decision today to treat with OMT was based on Physical Exam  After verbal consent patient was treated with HVLA, ME, FPR techniques in cervical, rib, thoracic, lumbar, and sacral  areas  Patient tolerated the procedure well with improvement in symptoms  Patient given exercises, stretches and lifestyle modifications  See medications in patient instructions if given  Patient will follow up in 4-8 weeks    The above documentation  has been reviewed and is accurate and complete Nichelle Renwick M Navarro Nine, DO          Note: This dictation was prepared with Dragon dictation along with smaller phrase technology. Any transcriptional errors that result from this process are unintentional.

## 2024-07-12 ENCOUNTER — Encounter: Payer: Self-pay | Admitting: Family Medicine

## 2024-07-12 ENCOUNTER — Ambulatory Visit: Admitting: Family Medicine

## 2024-07-12 VITALS — BP 128/82 | HR 85 | Ht 71.0 in

## 2024-07-12 DIAGNOSIS — M9908 Segmental and somatic dysfunction of rib cage: Secondary | ICD-10-CM | POA: Diagnosis not present

## 2024-07-12 DIAGNOSIS — M9901 Segmental and somatic dysfunction of cervical region: Secondary | ICD-10-CM | POA: Diagnosis not present

## 2024-07-12 DIAGNOSIS — M9903 Segmental and somatic dysfunction of lumbar region: Secondary | ICD-10-CM | POA: Diagnosis not present

## 2024-07-12 DIAGNOSIS — M069 Rheumatoid arthritis, unspecified: Secondary | ICD-10-CM

## 2024-07-12 DIAGNOSIS — M9902 Segmental and somatic dysfunction of thoracic region: Secondary | ICD-10-CM

## 2024-07-12 DIAGNOSIS — M9904 Segmental and somatic dysfunction of sacral region: Secondary | ICD-10-CM

## 2024-07-12 DIAGNOSIS — M533 Sacrococcygeal disorders, not elsewhere classified: Secondary | ICD-10-CM | POA: Diagnosis not present

## 2024-07-12 NOTE — Assessment & Plan Note (Signed)
 Sacroiliac dysfunction noted.  Discussed icing regimen and home exercises, discussed which activities to do and which ones to avoid.  Increase activity slowly.  Continue work on core strength.

## 2024-07-12 NOTE — Assessment & Plan Note (Signed)
 Continue to do relatively well.  Did have significant tightness noted on the right side of the neck.  Mild swelling noted over the [facility] joint.  Discussed icing regimen and home exercises.  Discussed which activities to do.  No change in medications.  Follow-up again in 6 to 8 weeks

## 2024-07-12 NOTE — Patient Instructions (Signed)
 Enjoy chicken army See me in 2 months

## 2024-07-13 ENCOUNTER — Other Ambulatory Visit (HOSPITAL_COMMUNITY): Payer: Self-pay

## 2024-07-16 ENCOUNTER — Other Ambulatory Visit: Payer: Self-pay

## 2024-07-20 ENCOUNTER — Other Ambulatory Visit: Payer: Self-pay

## 2024-07-20 NOTE — Progress Notes (Signed)
 Specialty Pharmacy Refill Coordination Note  April [name] is a 48 y.o. female contacted today regarding refills of specialty medication(s) Certolizumab Pegol  (CIMZIA )   Patient requested Delivery   Delivery date: 07/23/24   Verified address: [address] [place], [address]   Medication will be filled on 07/22/24.

## 2024-07-21 ENCOUNTER — Other Ambulatory Visit: Payer: Self-pay

## 2024-07-29 ENCOUNTER — Other Ambulatory Visit (HOSPITAL_COMMUNITY): Payer: Self-pay

## 2024-07-30 ENCOUNTER — Other Ambulatory Visit: Payer: Self-pay

## 2024-08-02 ENCOUNTER — Other Ambulatory Visit: Payer: Self-pay | Admitting: Internal Medicine

## 2024-08-02 ENCOUNTER — Other Ambulatory Visit: Payer: Self-pay

## 2024-08-04 ENCOUNTER — Other Ambulatory Visit (HOSPITAL_COMMUNITY): Payer: Self-pay

## 2024-08-06 ENCOUNTER — Other Ambulatory Visit (HOSPITAL_COMMUNITY): Payer: Self-pay

## 2024-08-08 ENCOUNTER — Encounter: Payer: Self-pay | Admitting: Internal Medicine

## 2024-08-09 ENCOUNTER — Other Ambulatory Visit (HOSPITAL_COMMUNITY): Payer: Self-pay

## 2024-08-09 MED ORDER — MONTELUKAST SODIUM 10 MG PO TABS
10.0000 mg | ORAL_TABLET | Freq: Every day | ORAL | 1 refills | Status: AC
  Filled 2024-08-09: qty 90, 90d supply, fill #0
  Filled 2024-10-24: qty 90, 90d supply, fill #1

## 2024-08-11 ENCOUNTER — Other Ambulatory Visit: Payer: Self-pay

## 2024-08-11 ENCOUNTER — Ambulatory Visit: Attending: Internal Medicine | Admitting: Pharmacist

## 2024-08-11 DIAGNOSIS — Z79899 Other long term (current) drug therapy: Secondary | ICD-10-CM

## 2024-08-11 NOTE — Progress Notes (Signed)
 Specialty Pharmacy Ongoing Clinical Assessment Note  April [name] is a 48 y.o. female who is being followed by the specialty pharmacy service for RxSp Rheumatoid Arthritis   Patient's specialty medication(s) reviewed today: Certolizumab Pegol  (CIMZIA )   Missed doses in the last 4 weeks: 1 (Has 1 dose remaining on hand for 08/23/24 injection, after that will have none on hand for 09/06/24 dose.)   Patient/Caregiver did not have any additional questions or concerns.   Therapeutic benefit summary: Patient is achieving benefit   Adverse events/side effects summary: No adverse events/side effects   Patient's therapy is appropriate to: Continue    Goals Addressed             This Visit's Progress    Stabilization of disease       Patient is on track but is experiencing less relief with Cimzia  than had in past; experiences hand and wrist pain that didn't have before. Patient will maintain adherence and be monitored by provider to determine if a change in treatment plan is warranted. Per patient, provider is aware and they are working to maintain on therapy for as long as possible before switching to another agent.          Follow up: 1 year  Powell CHRISTELLA Gallus Specialty Pharmacist

## 2024-08-11 NOTE — Progress Notes (Signed)
   S: Patient presents today telephonically for review of their specialty medication.   Patient is taking Cimzia  (certolizumab) for rheumatoid arthritis. She is managed by Jon Jacob for this. She was seen by specialty pharmacy earlier today.   Dosing: Note: Each 400 mg dose should be administered as 2 injections of 200 mg each. Rheumatoid arthritis: SubQ: Initial: 400 mg, repeat dose 2 and 4 weeks after initial dose; Maintenance: 200 mg every other week. May consider maintenance dose of 400 mg every 4 weeks. May be administered alone or in combination with methotrexate .  Efficacy: mentioned to pharmacy that she has less control. She also expressed this to me last year.   Monitoring: none  Adverse events: none  O:   Lab Results  Component Value Date   WBC 7.8 05/24/2024   HGB 12.8 05/24/2024   HCT 37.7 05/24/2024   MCV 88.5 05/24/2024   PLT 361.0 05/24/2024      Chemistry      Component Value Date/Time   NA 137 05/24/2024 1448   NA 139 09/04/2023 0000   K 3.9 05/24/2024 1448   CL 104 05/24/2024 1448   CO2 26 05/24/2024 1448   BUN 12 05/24/2024 1448   BUN 11 09/04/2023 0000   CREATININE 0.72 05/24/2024 1448   CREATININE 0.87 05/26/2020 0841   GLU 108 09/04/2023 0000      Component Value Date/Time   CALCIUM 9.9 05/24/2024 1448   ALKPHOS 70 09/04/2023 0000   AST 17 09/04/2023 0000   ALT 18 09/04/2023 0000   BILITOT 0.3 09/17/2022 0944     A/P: 1. Medication review: patient currently taking Cimzia  for rheumatoid arthritis. Reviewed the medication with the patient, including the following: Cimzia  is a TNF blocking agent used in the treatment of rheumatoid arthritis. She will review videos on how to administer the medication. Possible adverse effects include GI upset, antibody development, increased risk of infection, hypersensitivity, demyelinating CNS disease, hematologic effects, and increased risk of malignancy. Use with caution in patients with heart failure. Avoid  use of live vaccinations and let doctor know of any infections as treatment with Cimzia  may need to be held during illness. No recommendations for any changes.  Herlene Fleeta Morris, PharmD, JAQUELINE, CPP Clinical Pharmacist [facility] [phone]

## 2024-08-18 ENCOUNTER — Other Ambulatory Visit: Payer: Self-pay

## 2024-08-18 ENCOUNTER — Encounter (INDEPENDENT_AMBULATORY_CARE_PROVIDER_SITE_OTHER): Payer: Self-pay

## 2024-08-18 ENCOUNTER — Other Ambulatory Visit (HOSPITAL_COMMUNITY): Payer: Self-pay

## 2024-08-22 ENCOUNTER — Other Ambulatory Visit: Payer: Self-pay | Admitting: Internal Medicine

## 2024-08-23 ENCOUNTER — Other Ambulatory Visit (HOSPITAL_COMMUNITY): Payer: Self-pay

## 2024-08-24 ENCOUNTER — Encounter (INDEPENDENT_AMBULATORY_CARE_PROVIDER_SITE_OTHER): Payer: Self-pay | Admitting: Family Medicine

## 2024-08-24 ENCOUNTER — Other Ambulatory Visit (HOSPITAL_COMMUNITY): Payer: Self-pay

## 2024-08-24 ENCOUNTER — Other Ambulatory Visit: Payer: Self-pay

## 2024-08-24 ENCOUNTER — Ambulatory Visit (INDEPENDENT_AMBULATORY_CARE_PROVIDER_SITE_OTHER): Admitting: Family Medicine

## 2024-08-24 VITALS — BP 115/75 | HR 82 | Temp 98.7°F | Ht 70.0 in | Wt 273.0 lb

## 2024-08-24 DIAGNOSIS — I10 Essential (primary) hypertension: Secondary | ICD-10-CM | POA: Diagnosis not present

## 2024-08-24 DIAGNOSIS — E782 Mixed hyperlipidemia: Secondary | ICD-10-CM | POA: Diagnosis not present

## 2024-08-24 DIAGNOSIS — F39 Unspecified mood [affective] disorder: Secondary | ICD-10-CM

## 2024-08-24 DIAGNOSIS — R0602 Shortness of breath: Secondary | ICD-10-CM

## 2024-08-24 DIAGNOSIS — E66812 Obesity, class 2: Secondary | ICD-10-CM | POA: Diagnosis not present

## 2024-08-24 DIAGNOSIS — Z6837 Body mass index (BMI) 37.0-37.9, adult: Secondary | ICD-10-CM

## 2024-08-24 DIAGNOSIS — Z6839 Body mass index (BMI) 39.0-39.9, adult: Secondary | ICD-10-CM

## 2024-08-24 DIAGNOSIS — R5383 Other fatigue: Secondary | ICD-10-CM | POA: Diagnosis not present

## 2024-08-24 DIAGNOSIS — R7303 Prediabetes: Secondary | ICD-10-CM

## 2024-08-24 DIAGNOSIS — F5089 Other specified eating disorder: Secondary | ICD-10-CM

## 2024-08-24 DIAGNOSIS — R9431 Abnormal electrocardiogram [ECG] [EKG]: Secondary | ICD-10-CM | POA: Diagnosis not present

## 2024-08-24 DIAGNOSIS — Z1331 Encounter for screening for depression: Secondary | ICD-10-CM

## 2024-08-24 MED ORDER — ESCITALOPRAM OXALATE 20 MG PO TABS
20.0000 mg | ORAL_TABLET | Freq: Every day | ORAL | 0 refills | Status: DC
  Filled 2024-08-24: qty 90, 90d supply, fill #0

## 2024-08-24 NOTE — Progress Notes (Signed)
 April [name], D.O.  ABFM, ABOM Specializing in Clinical Bariatric Medicine Office located at: [address] [place], [place]  [address]   Bariatric Medicine Visit  Dear April Debby CROME, MD   Thank you for referring April [name] to our clinic today for evaluation.  We performed a consultation to discuss her options for treatment and educate the patient on her disease state.  The following note includes my evaluation and treatment recommendations.   Please do not hesitate to reach out to me directly if you have any further concerns.    Assessment and Plan:   Orders Placed This Encounter  Procedures   Lipid panel   Hemoglobin A1c   Insulin , random   Magnesium    VITAMIN D  25 Hydroxy (Vit-D Deficiency, Fractures)   TSH   T4, free   Comprehensive metabolic panel with GFR   CBC with Differential/Platelet   Vitamin B12   Folate   Iron and TIBC   Ferritin   EKG 12-Lead       Obtain labs today and review at next OV.  FOR THE DISEASE OF OBESITY:  Class 2 obesity with body mass index (BMI) of 39.0 to 39.9 in adult, unspecified obesity type, start BMI 39.17 Obesity with current BMI of 39.17 Assessment & Plan: April [name] is currently in the action stage of change. As such, her goal is to start our weight management plan.  She has agreed to implement: follow the Category 2 plan - 1200 kcal per day with 2 - 4 extra oz of protein.    Behavioral Intervention We discussed the following Behavioral Modification Strategies today: increasing lean protein intake to established goals, decreasing simple carbohydrates , increasing vegetables, better snacking choices, focusing on food with a 10:1 ratio of calories: grams of protein, going to the skinnytaste website for recipe ideas, and using GPT or another AI platform for recipe ideas- searching low calorie, low carb, high protein chicken recipes etc  Additional resources provided today: Handout on complex carbohydrates and  lean sources of protein, Handout on CAT 2 meal plan, Handout on CAT 1-2 breakfast options, and Provided patient with personalized instruction and demonstration on the use of artificial intelligence for recipes, calorie tracking, and finding healthier options when eating out. , Healthy Cereal Options, Handout on Grocery List  Evidence-based interventions for health behavior change were utilized today including the discussion of self monitoring techniques, problem-solving barriers and SMART goal setting techniques.    Goal(s) for next OV: weigh protein and veggies    Recommended Physical Activity Goals April [name] has been advised to work up to 300-450  minutes of moderate intensity aerobic activity a week and strengthening exercises 2-3 times per week for cardiovascular health, weight loss maintenance and preservation of muscle mass.   She has agreed to : maintain current level of activity.    Pharmacotherapy We discussed various medication options to help [name] with her weight loss efforts and we both agreed to: Begin with nutritional and behavioral strategies   ASSOCIATED CONDITIONS ADDRESSED TODAY:  Fatigue Assessment & Plan: April [name] does feel that her weight is causing her energy to be lower than it should be. Fatigue may be related to obesity, depression or many other causes. she does not appear to have any red flag symptoms and this appears to most likely be related to her current lifestyle habits and dietary intake.  Labs will be ordered and reviewed with her at their next office visit in two weeks.  Epworth sleepiness  scale is 5 and appears to be within normal limits. April [name] denies daytime somnolence and reports waking up still tired. Patient has a history of symptoms of hypertension. April [name] generally gets 7 or 8 hours of sleep per night, and states that she has difficulty falling asleep. Snoring is present. Apneic episodes are not present.   ECG: Performed and reviewed/ interpreted  independently.  Normal sinus rhythm, rate 78 bpm; reassuring without any acute abnormalities, will continue to monitor for symptoms     Shortness of breath on exertion Assessment & Plan: April [name] does feel that she gets out of breath more easily than she used to when she exercises and seems to be worsening over time with weight gain.  This has gotten worse recently. April [name] denies shortness of breath at rest or orthopnea. Pt denies chest pain, dizziness, heart palpitations, or excessive diaphoresis or nausea with activity.  This is not new and is ongoing.  April [name]'s shortness of breath appears to be obesity related and exercise induced, as they do not appear to have any red flag symptoms/ concerns today.  Also, this condition appears to be related to a state of poor cardiovascular conditioning   Obtain labs today and will be reviewed with her at their next office visit in two weeks.  Indirect Calorimeter completed today to help guide our dietary regimen. It shows a VO2 of 354 and a REE of 2448.  Her calculated basal metabolic rate is 7953 thus her resting energy expenditure is better than expected.  Patient agreed to work on weight loss at this time.  As April [name] progresses through our weight loss program, we will gradually increase exercise as tolerated to treat her current condition.   If April [name] follows our recommendations and loses 5-10% of their weight without improvement of her shortness of breath or if at any time, symptoms become more concerning, they agree to urgently follow up with their PCP/ specialist for further consideration/ evaluation.   April [name] verbalizes agreement with this plan.     Prediabetes Assessment & Plan Lab Results  Component Value Date   HGBA1C 5.9 05/24/2024   HGBA1C 5.8 11/20/2023   HGBA1C 5.5 09/17/2022   INSULIN  11.8 09/17/2022   INSULIN  5.7 03/18/2022   INSULIN  9.4 10/16/2021    Pt was diagnosed with Pre-DM with a high A1C in 05/17/21. She was previously on  Metformin  for pre-dm and helped control her excessive hunger and A1C levels. Most recently on 05/24/24 her A1C was 5.9 on 05/24/24. It is currently managed with lifestyle and diet interventions. Begin following meal plan and decrease simple carbs/sugar. Will obtain labs today.     Essential hypertension Assessment & Plan BP Readings from Last 3 Encounters:  08/24/24 115/75  07/12/24 128/82  05/20/24 128/82   The 10-year ASCVD risk score (Arnett DK, et al., 2019) is: 0.8%  Lab Results  Component Value Date   CREATININE 0.72 05/24/2024   On Cozaar  50 mg daily. With good compliance and tolerance.  BP today was a little high today. Pt states that she had not taken her medication. Pt endorses checking her BP at home and it being well controlled. Begin following nutritional meal plan and decrease excess salt processed, frozen, or prepackaged food. Continue with medications.    Mixed hyperlipidemia Assessment & Plan Lab Results  Component Value Date   CHOL 230 (H) 11/20/2023   HDL 79.60 11/20/2023   LDLCALC 124 (H) 11/20/2023   LDLDIRECT 96.0 05/26/2019   TRIG 129.0 11/20/2023   CHOLHDL 3  11/20/2023    On Fenofibrate  160 mg daily and Lovaza  2 g twice daily. With good compliance and tolerance. Pt states that she has a long family history of HLD. Continue with medications and begin following meal plan. Decrease saturated and trans fat and increase exercise as tolerated. Will obtain labs today and review at next OV.   Depression Screen  Mood disorder (HCC) with emotional eating Assessment & Plan Her PHQ-9 score was 15 today. Makes life somewhat difficult. Denies any SI/HI On Buspar  15 mg twice daily, Rexulti  0.5 mg daily and Lexapro   20 mg daily. She states that the Rexulti  has helped tremendously.  Pt states that she eats when she's stressed, bored as a reward, to help comfort herself and to help her stay awake. Pt reports that she feels out of control when eating sometimes. Her favorite  snack are skittles. She reports that her anxiety and depression is stable but not well controlled. Pt declined Dr.Barker at this time and will look into finding a Careers information officer in the near future.     Prolonged Q-T interval on ECG Assessment & Plan Reviewed previous ECG's that show elevated QT. First elevated QT was in January of 2020. ECG shows a prolonged QT at 483. We reccommended pt to follow up with Dr.Jones. Pt appears to be on some medication that might be exasperating this condition (albuterol  and lexapro ). Will continue monitoring alongside PCP.     FOLLOW UP:   Follow up in 2 weeks. She was informed of the importance of frequent follow up visits to maximize her success with intensive lifestyle modifications for her multiple health conditions.  April [name] is aware that we will review all of her lab results at our next visit.  She is aware that if anything is critical/ life threatening with the results, we will be contacting her via MyChart prior to the office visit to discuss management.     Chief Complaint:   OBESITY April [name] (MR# [id number]) is a pleasant 48 y.o. female who presents for evaluation and treatment of obesity and related comorbidities. Current BMI is Body mass index is 39.17 kg/m. Yachet KYRSTAN GOTWALT has been struggling with her weight for many years and has been unsuccessful in either losing weight, maintaining weight loss, or reaching her healthy weight goal.  April MAZARIEGO is currently in the action stage of change and ready to dedicate time achieving and maintaining a healthier weight. April [name] is interested in becoming our patient and working on intensive lifestyle modifications including (but not limited to) diet and exercise for weight loss.  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 Patient is married to Eugene and does not have children. She lives with her husband.  - Pt walks 20  minutes 4 times a week but not regularly.  - Wants to be 190 lbs in 2 years.   - Wants to lose weight to help with poor health, limited mobility, poor fitness and cardiovascular fitness.   - Pt was diagnosed with Rheumatoid Arthritis 7 years ago and more weight is making pain increase  - Starting gaining weight at 48 years old   - Pt states that she eats too much too often  - Pt was in this program 2 years ago and lost 30 - 40 lbs in 1 year but regained weight when she left the problem.  - Diets she's tried in the past: Food lovers fat loss system and  weight watchers  - Thinks food lovers fat loss system worked the best because it gave her a combination of foods she could have.   - Eats outside home 9 times a week on average.   - Eats fast food or take out 3 times a week.  - Craves pizza, sweets and meats.   - Has cravings mid afternoon  - Snacks between meals and after dinner  - Snacks on pretzels and candy   - Worst Food Habit: Eating candy and sweets   - Caloric Beverages she drinks: Coffee with cream, premier protein shakes and skim milk  - Pt states that she came back because her weight is peaking and her clothes are not fitting and she is starting to panic.    Subjective:   This is the patient's first visit at [facility].  The patient's NEW PATIENT PACKET that they filled out prior to today's office visit was reviewed at length and information from that paperwork was included within the following office visit note.    Included in the packet: current and past health history, medications, allergies, ROS, gynecologic history (women only), surgical history, family history, social history, weight history, weight loss surgery history (for those that have had weight loss surgery), nutritional evaluation, mood and food questionnaire along with a depression screening (PHQ9) on all patients, an Epworth questionnaire, sleep habits questionnaire, patient life and  health improvement goals questionnaire. These will all be scanned into the patient's chart under the media tab.   Review of Systems: Please refer to new patient packet scanned into media. Pertinent positives were addressed with patient today.  Reviewed by clinician on day of visit: allergies, medications, problem list, medical history, surgical history, family history, social history, and previous encounter notes.  During the visit, I independently reviewed the patient's EKG, bioimpedance scale results, and indirect calorimeter results. I used this information to tailor a meal plan for the patient that will help April [name] to lose weight and will improve her obesity-related conditions going forward.  I performed a medically necessary appropriate examination and/or evaluation. I discussed the assessment and treatment plan with the patient. The patient was provided an opportunity to ask questions and all were answered. The patient agreed with the plan and demonstrated an understanding of the instructions. Labs were ordered today (unless patient declined them) and will be reviewed with the patient at our next visit unless more critical results need to be addressed immediately. Clinical information was updated and documented in the EMR.    Objective:   PHYSICAL EXAM: Blood pressure 115/75, pulse 82, temperature 98.7 F (37.1 C), height 5' 10 (1.778 m), weight 273 lb (123.8 kg), SpO2 97%. Body mass index is 39.17 kg/m.  General: Well Developed, well nourished, and in no acute distress.  HEENT: Normocephalic, atraumatic; EOMI, sclerae are anicteric. Skin: Warm and dry, good turgor Chest:  Normal excursion, shape, no gross ABN Respiratory: No conversational dyspnea; speaking in full sentences NeuroM-Sk:  Normal gross ROM * 4 extremities  Psych: A and O *3, insight adequate, mood- full   Anthropometric Measurements Height: 5' 10 (1.778 m) Weight: 273 lb (123.8 kg) BMI (Calculated):  39.17 Weight at Last Visit: NA Weight Lost Since Last Visit: 0 Weight Gained Since Last Visit: 0 Starting Weight: 273lb Total Weight Loss (lbs): 0 lb (0 kg) Peak Weight: 273lb Waist Measurement : 43.5 inches   Body Composition  Body Fat %: 47.3 % Fat Mass (lbs): 129.4 lbs Muscle Mass (lbs):  136.8 lbs Total Body Water (lbs): 102.2 lbs Visceral Fat Rating : 13   Other Clinical Data RMR: 2448 Fasting: Yes Labs: Yes Today's Visit #: 1 Starting Date: 08/24/24 Comments: New Pt.    DIAGNOSTIC DATA REVIEWED:  BMET    Component Value Date/Time   NA 137 05/24/2024 1448   NA 139 09/04/2023 0000   K 3.9 05/24/2024 1448   CL 104 05/24/2024 1448   CO2 26 05/24/2024 1448   GLUCOSE 138 (H) 05/24/2024 1448   BUN 12 05/24/2024 1448   BUN 11 09/04/2023 0000   CREATININE 0.72 05/24/2024 1448   CREATININE 0.87 05/26/2020 0841   CALCIUM 9.9 05/24/2024 1448   GFRNONAA >60 12/15/2018 1402   GFRAA >60 12/15/2018 1402   Lab Results  Component Value Date   HGBA1C 5.9 05/24/2024   HGBA1C 5.8 04/21/2013   Lab Results  Component Value Date   INSULIN  11.8 09/17/2022   INSULIN  16.3 05/17/2021   Lab Results  Component Value Date   TSH 1.71 11/20/2023   CBC    Component Value Date/Time   WBC 7.8 05/24/2024 1448   RBC 4.25 05/24/2024 1448   HGB 12.8 05/24/2024 1448   HGB 12.5 10/16/2021 0814   HCT 37.7 05/24/2024 1448   HCT 37.3 10/16/2021 0814   PLT 361.0 05/24/2024 1448   PLT 379 10/16/2021 0814   MCV 88.5 05/24/2024 1448   MCV 89 10/16/2021 0814   MCH 30.0 10/16/2021 0814   MCH 29.2 05/26/2020 0841   MCHC 34.0 05/24/2024 1448   RDW 14.3 05/24/2024 1448   RDW 13.0 10/16/2021 0814   Iron Studies    Component Value Date/Time   IRON 82 05/30/2021 0830   FERRITIN 290.9 05/30/2021 0830   IRONPCTSAT 22.4 09/18/2017 1110   Lipid Panel     Component Value Date/Time   CHOL 230 (H) 11/20/2023 0856   CHOL 193 03/18/2022 1054   TRIG 129.0 11/20/2023 0856   HDL 79.60  11/20/2023 0856   HDL 62 03/18/2022 1054   CHOLHDL 3 11/20/2023 0856   VLDL 25.8 11/20/2023 0856   LDLCALC 124 (H) 11/20/2023 0856   LDLCALC 106 (H) 03/18/2022 1054   LDLCALC 115 (H) 05/26/2020 0841   LDLDIRECT 96.0 05/26/2019 0835   Hepatic Function Panel     Component Value Date/Time   PROT 7.3 09/17/2022 0944   ALBUMIN 3.8 09/04/2023 0000   ALBUMIN 4.1 09/17/2022 0944   AST 17 09/04/2023 0000   ALT 18 09/04/2023 0000   ALKPHOS 70 09/04/2023 0000   BILITOT 0.3 09/17/2022 0944   BILIDIR 0.1 05/26/2020 0841   IBILI 0.2 05/26/2020 0841      Component Value Date/Time   TSH 1.71 11/20/2023 0856   Nutritional Lab Results  Component Value Date   VD25OH 48.76 11/20/2023   VD25OH 57.5 09/17/2022   VD25OH 120.0 (H) 03/18/2022    Attestation Statements:   LILLETTE Sonny Laroche, acting as a Stage manager for April Jenkins, DO., have compiled all relevant documentation for today's office visit on behalf of April Jenkins, DO, while in the presence of [employer], DO.  I have spent 60 minutes in the care of the patient today including: 38 minutes face-to-face assessing and reviewing listed medical problems above as outlined in office visit note, providing nutritional and behavioral counseling as outlined in obesity care plan, independently interpreting results and goals of care, see listed medical problems, and discussing biometric information and progress. 12 minutes was spent on review of chart/new patient packet  and additional post-visit documentation.  I have reviewed the above documentation for accuracy and completeness, and I agree with the above. April JINNY [name], D.O.  The 21st Century Cures Act was signed into law in 2016 which includes the topic of electronic health records.  This provides immediate access to information in MyChart.  This includes consultation notes, operative notes, office notes, lab results and pathology reports.  If you have any questions about what  you read please let us  know at your next visit so we can discuss your concerns and take corrective action if need be.  We are right here with you.

## 2024-08-25 ENCOUNTER — Other Ambulatory Visit: Payer: Self-pay

## 2024-08-25 ENCOUNTER — Other Ambulatory Visit (HOSPITAL_COMMUNITY): Payer: Self-pay

## 2024-08-25 LAB — COMPREHENSIVE METABOLIC PANEL WITH GFR
ALT: 18 IU/L (ref 0–32)
AST: 18 IU/L (ref 0–40)
Albumin: 4.2 g/dL (ref 3.9–4.9)
Alkaline Phosphatase: 75 IU/L (ref 41–116)
BUN/Creatinine Ratio: 22 (ref 9–23)
BUN: 15 mg/dL (ref 6–24)
Bilirubin Total: 0.4 mg/dL (ref 0.0–1.2)
CO2: 20 mmol/L (ref 20–29)
Calcium: 9.9 mg/dL (ref 8.7–10.2)
Chloride: 103 mmol/L (ref 96–106)
Creatinine, Ser: 0.69 mg/dL (ref 0.57–1.00)
Globulin, Total: 3.2 g/dL (ref 1.5–4.5)
Glucose: 72 mg/dL (ref 70–99)
Potassium: 4.5 mmol/L (ref 3.5–5.2)
Sodium: 138 mmol/L (ref 134–144)
Total Protein: 7.4 g/dL (ref 6.0–8.5)
eGFR: 108 mL/min/1.73 (ref >59)

## 2024-08-25 LAB — INSULIN, RANDOM: INSULIN: 15.8 u[IU]/mL (ref 2.6–24.9)

## 2024-08-25 LAB — LIPID PANEL
Chol/HDL Ratio: 3.1 ratio (ref 0.0–4.4)
Cholesterol, Total: 222 mg/dL — ABNORMAL HIGH (ref 100–199)
HDL: 72 mg/dL (ref >39)
LDL Chol Calc (NIH): 118 mg/dL — ABNORMAL HIGH (ref 0–99)
Triglycerides: 185 mg/dL — ABNORMAL HIGH (ref 0–149)
VLDL Cholesterol Cal: 32 mg/dL (ref 5–40)

## 2024-08-25 LAB — CBC WITH DIFFERENTIAL/PLATELET
Basophils Absolute: 0 x10E3/uL (ref 0.0–0.2)
Basos: 0 %
EOS (ABSOLUTE): 0.4 x10E3/uL (ref 0.0–0.4)
Eos: 5 %
Hematocrit: 43.8 % (ref 34.0–46.6)
Hemoglobin: 13.7 g/dL (ref 11.1–15.9)
Immature Grans (Abs): 0 x10E3/uL (ref 0.0–0.1)
Immature Granulocytes: 0 %
Lymphocytes Absolute: 2.3 x10E3/uL (ref 0.7–3.1)
Lymphs: 32 %
MCH: 29.9 pg (ref 26.6–33.0)
MCHC: 31.3 g/dL — ABNORMAL LOW (ref 31.5–35.7)
MCV: 96 fL (ref 79–97)
Monocytes Absolute: 0.5 x10E3/uL (ref 0.1–0.9)
Monocytes: 7 %
Neutrophils Absolute: 4 x10E3/uL (ref 1.4–7.0)
Neutrophils: 56 %
Platelets: 375 x10E3/uL (ref 150–450)
RBC: 4.58 x10E6/uL (ref 3.77–5.28)
RDW: 13 % (ref 11.7–15.4)
WBC: 7.2 x10E3/uL (ref 3.4–10.8)

## 2024-08-25 LAB — VITAMIN D 25 HYDROXY (VIT D DEFICIENCY, FRACTURES): Vit D, 25-Hydroxy: 53.6 ng/mL (ref 30.0–100.0)

## 2024-08-25 LAB — HEMOGLOBIN A1C
Est. average glucose Bld gHb Est-mCnc: 114 mg/dL
Hgb A1c MFr Bld: 5.6 % (ref 4.8–5.6)

## 2024-08-25 LAB — TSH: TSH: 1.44 u[IU]/mL (ref 0.450–4.500)

## 2024-08-25 LAB — MAGNESIUM: Magnesium: 2 mg/dL (ref 1.6–2.3)

## 2024-08-25 LAB — T4, FREE: Free T4: 1.11 ng/dL (ref 0.82–1.77)

## 2024-08-25 NOTE — Progress Notes (Signed)
 Specialty Pharmacy Refill Coordination Note  April [name] is a 48 y.o. female contacted today regarding refills of specialty medication(s) Certolizumab Pegol  (CIMZIA )   Patient requested Delivery   Delivery date: 08/27/24   Verified address: [address] [place], [address]   Medication will be filled on 08/26/24.

## 2024-09-02 NOTE — Progress Notes (Unsigned)
 [name] [address] [place] [address] Phone: [phone] Subjective:   April [name], am serving as a scribe for Dr. Arthea Claudene.  I'm seeing this patient by the request  of:  April Debby CROME, MD  CC: Low back and neck pain  YEP:Dlagzrupcz  April [name] is a 48 y.o. female coming in with complaint of back and neck pain. OMT on 07/12/2024. Patient states that back and ribs are doing well.   Pain in R elbow over medial epicondyle for a few months. Pain is constant. Flexing elbow and writing increase her pain. Denies any radiating symptoms.   Medications patient has been prescribed:   Taking:         Reviewed prior external information including notes and imaging from previsou exam, outside providers and external EMR if available.   As well as notes that were available from care everywhere and other healthcare systems.  Past medical history, social, surgical and family history all reviewed in electronic medical record.  No pertanent information unless stated regarding to the chief complaint.   Past Medical History:  Diagnosis Date   ALLERGIC RHINITIS    Allergies    Anal fissure    Anxiety    ANXIETY DEPRESSION    Back pain    Boils    CONSTIPATION    Constipation    DEPRESSION    Esophageal reflux    Food allergy    Avacados   Gastritis    GERD (gastroesophageal reflux disease)    HYPERCHOLESTEROLEMIA    Hyperlipidemia    Hypertension    Joint pain    OBESITY    Occipital neuralgia    Palpitations    Plantar fasciitis    Prediabetes    Psoriasis    Resting tremor    Rheumatoid arthritis (HCC) 12/24/2017   SOB (shortness of breath) on exertion    TMJ (dislocation of temporomandibular joint)     Allergies  Allergen Reactions   Ciprofloxacin     toxicity   Levaquin  [Levofloxacin ] Anxiety    Possible CNS side effects. TOXICITY   Moxifloxacin     TOXICITY   Avocado     Other reaction(s): Unknown      Review of Systems:  No headache, visual changes, nausea, vomiting, diarrhea, constipation, dizziness, abdominal pain, skin rash, fevers, chills, night sweats, weight loss, swollen lymph nodes, body aches, joint swelling, chest pain, shortness of breath, mood changes. POSITIVE muscle aches  Objective  There were no vitals taken for this visit.   General: No apparent distress alert and oriented x3 mood and affect normal, dressed appropriately.  HEENT: Pupils equal, extraocular movements intact  Respiratory: Patient's speak in full sentences and does not appear short of breath  Cardiovascular: No lower extremity edema, non tender, no erythema  Gait MSK:  Back does have some loss lordosis noted.  Some tenderness to palpation of the paraspinal musculature.  Tightness with FABER test right greater than left.  Osteopathic findings  C3 flexed rotated and side bent right C7 flexed rotated and side bent left T3 extended rotated and side bent right inhaled rib T6 extended rotated and side bent left L2 flexed rotated and side bent right Sacrum right on right       Assessment and Plan:  No problem-specific Assessment & Plan notes found for this encounter.    Nonallopathic problems  Decision today to treat with OMT was based on Physical Exam  After verbal consent  patient was treated with HVLA, ME, FPR techniques in cervical, rib, thoracic, lumbar, and sacral  areas  Patient tolerated the procedure well with improvement in symptoms  Patient given exercises, stretches and lifestyle modifications  See medications in patient instructions if given  Patient will follow up in 4-8 weeks    The above documentation has been reviewed and is accurate and complete April CHRISTELLA Sharps, DO          Note: This dictation was prepared with Dragon dictation along with smaller phrase technology. Any transcriptional errors that result from this process are unintentional.

## 2024-09-06 ENCOUNTER — Ambulatory Visit: Admitting: Family Medicine

## 2024-09-06 ENCOUNTER — Other Ambulatory Visit: Payer: Self-pay

## 2024-09-06 VITALS — BP 120/82 | HR 88 | Ht 70.0 in | Wt 276.0 lb

## 2024-09-06 DIAGNOSIS — M25521 Pain in right elbow: Secondary | ICD-10-CM

## 2024-09-06 DIAGNOSIS — M9904 Segmental and somatic dysfunction of sacral region: Secondary | ICD-10-CM

## 2024-09-06 DIAGNOSIS — M9903 Segmental and somatic dysfunction of lumbar region: Secondary | ICD-10-CM

## 2024-09-06 DIAGNOSIS — G5621 Lesion of ulnar nerve, right upper limb: Secondary | ICD-10-CM | POA: Diagnosis not present

## 2024-09-06 DIAGNOSIS — M9901 Segmental and somatic dysfunction of cervical region: Secondary | ICD-10-CM | POA: Diagnosis not present

## 2024-09-06 DIAGNOSIS — M9908 Segmental and somatic dysfunction of rib cage: Secondary | ICD-10-CM

## 2024-09-06 DIAGNOSIS — M9902 Segmental and somatic dysfunction of thoracic region: Secondary | ICD-10-CM

## 2024-09-06 DIAGNOSIS — M533 Sacrococcygeal disorders, not elsewhere classified: Secondary | ICD-10-CM

## 2024-09-06 NOTE — Patient Instructions (Signed)
 Nerve flossing Compression daily for 2 week Avoid impact in that area Get that possum See me in 2-3 months

## 2024-09-07 ENCOUNTER — Encounter: Payer: Self-pay | Admitting: Family Medicine

## 2024-09-07 ENCOUNTER — Encounter (INDEPENDENT_AMBULATORY_CARE_PROVIDER_SITE_OTHER): Payer: Self-pay | Admitting: Family Medicine

## 2024-09-07 ENCOUNTER — Ambulatory Visit (INDEPENDENT_AMBULATORY_CARE_PROVIDER_SITE_OTHER): Admitting: Family Medicine

## 2024-09-07 VITALS — BP 132/87 | HR 81 | Temp 97.8°F | Ht 70.0 in | Wt 271.0 lb

## 2024-09-07 DIAGNOSIS — R7303 Prediabetes: Secondary | ICD-10-CM | POA: Diagnosis not present

## 2024-09-07 DIAGNOSIS — Z6838 Body mass index (BMI) 38.0-38.9, adult: Secondary | ICD-10-CM

## 2024-09-07 DIAGNOSIS — F39 Unspecified mood [affective] disorder: Secondary | ICD-10-CM

## 2024-09-07 DIAGNOSIS — E66812 Obesity, class 2: Secondary | ICD-10-CM | POA: Diagnosis not present

## 2024-09-07 DIAGNOSIS — F5089 Other specified eating disorder: Secondary | ICD-10-CM

## 2024-09-07 DIAGNOSIS — Z6837 Body mass index (BMI) 37.0-37.9, adult: Secondary | ICD-10-CM

## 2024-09-07 DIAGNOSIS — E782 Mixed hyperlipidemia: Secondary | ICD-10-CM

## 2024-09-07 DIAGNOSIS — I10 Essential (primary) hypertension: Secondary | ICD-10-CM | POA: Diagnosis not present

## 2024-09-07 DIAGNOSIS — G5620 Lesion of ulnar nerve, unspecified upper limb: Secondary | ICD-10-CM | POA: Insufficient documentation

## 2024-09-07 DIAGNOSIS — E559 Vitamin D deficiency, unspecified: Secondary | ICD-10-CM | POA: Diagnosis not present

## 2024-09-07 NOTE — Progress Notes (Signed)
 Barnie DOROTHA Jenkins, D.O.  ABFM, ABOM Clinical Bariatric Medicine Physician  Office located at: [address] [place], [place]  [address]    FOR THE CHRONIC DISEASE OF OBESITY:  Chief complaint: Obesity April [name] is here to discuss her progress with her obesity treatment plan.   History of present illness / Interval history:  April [name] is here today for her first follow-up office visit since starting the program with us . Patient is off to a good start and has lost 2 lbs. Pt states that she feels very ashamed and disappointed on how she feels she did for the first 2 weeks. She feels like she is letting herself down. Her stress has been increased due to dumb life things like a roof issue and washing machine issues. She endorses drinking about 10 cups of water a day.     08/24/24 09/07/24 14:00   Body Fat % 47.3 % 41 %  Muscle Mass (lbs) 136.8 lbs 152.2 lbs  Fat Mass (lbs) 129.4 lbs 111.4 lbs  Total Body Water (lbs) 102.2 lbs 106 lbs  Visceral Fat Rating  13 11   Counseling done today with patient on how various foods will affect these numbers and how to maximize fat loss success   Total lbs lost to date: - 2 lbs Total Fat Mass in lbs lost to date: + 0.4 lbs Total weight loss percentage to date: - 0.73 %    Class 2 obesity with body mass index (BMI) of 39.0 to 39.9 in adult, unspecified obesity type, start BMI 39.17 Obesity with current BMI of 38.88  Nutrition Therapy She is on the Category 2 Plan with 2 - 4 extra oz of protein and states she is following her eating plan approximately 10 % of the time.   - Tracking Calories/Macros: no   - Eating More Whole Foods: yes  - Adequate Protein Intake: no   - Adequate Water Intake: yes  - Skipping Meals: no   - Sleeping 7-9 Hours/ Night: yes  April [name] is currently in the action stage of change. As such, her goal is to continue weight management plan.  She has agreed un:Gnlmwjo 1600 calories with 110 + g protein  (with Cat 2 MP as guide)      Physical Activity  April [name] is walking on treadmill or videos, 20 minutes, 3 days per wk currently  April [name] has been advised to work up to 300-450 minutes of moderate intensity aerobic activity a week and strengthening exercises 2-3 times per week for cardiovascular health, weight loss maintenance and preservation of muscle mass.  She has agreed to : Think about enjoyable ways to increase daily physical activity and overcoming barriers to exercise, Increase physical activity in their day and reduce sedentary time (increase NEAT)., and Increase volume of physical activity to a goal of 240 minutes a week   Behavioral Modifications Evidence-based interventions for health behavior change were utilized today including the discussion of   1) SMART goals for next OV:    - Create Supportive environment ( get rid of unhealthy foods)    - don't skimp on protein   - Contact a counselor for CBT  Regarding patient's less desirable eating habits and patterns, we employed the technique of small changes.   We discussed the following today: increasing lean protein intake to established goals, keeping healthy foods at home, better snacking choices, and focusing on food with a 10:1 ratio of calories: grams of protein, reviewed goal exercise  time per week,  Additional resources provided today: Handout on protein equivalents of 2 ounces of meat or seafood, Handout on insulin  resistance education, and Handout on Common Characteristics of Successful Weight Losers and Maintainers    Medical Interventions/ Pharmacotherapy Previous Bariatric surgery: n/a Pharmacotherapy for weight loss: She is not currently taking medications  for medical weight loss.    We discussed various medication options to help April [name] with her weight loss efforts and we both agreed to : Continue with current nutritional and behavioral strategies   OBESITY RELATED CONDITIONS ADDRESSED  TODAY: Extensive review of labs performed today that were drawn at the new patient office visit on 08/24/24.    Prediabetes Assessment & Plan Lab Results  Component Value Date   HGBA1C 5.6 08/24/2024   HGBA1C 5.9 05/24/2024   HGBA1C 5.8 11/20/2023   INSULIN  15.8 08/24/2024   INSULIN  11.8 09/17/2022   INSULIN  5.7 03/18/2022   Lab Results  Component Value Date   TSH 1.440 08/24/2024   FREET4 1.11 08/24/2024   CMP     Component Value Date/Time   NA 138 08/24/2024 0936   K 4.5 08/24/2024 0936   CL 103 08/24/2024 0936   CO2 20 08/24/2024 0936   GLUCOSE 72 08/24/2024 0936   GLUCOSE 138 (H) 05/24/2024 1448   BUN 15 08/24/2024 0936   CREATININE 0.69 08/24/2024 0936   CREATININE 0.87 05/26/2020 0841   CALCIUM 9.9 08/24/2024 0936   PROT 7.4 08/24/2024 0936   ALBUMIN 4.2 08/24/2024 0936   AST 18 08/24/2024 0936   ALT 18 08/24/2024 0936   ALKPHOS 75 08/24/2024 0936   BILITOT 0.4 08/24/2024 0936   GFR 99.55 05/24/2024 1448   EGFR 108 08/24/2024 0936   GFRNONAA >60 12/15/2018 1402  CBC    Component Value Date/Time   WBC 7.2 08/24/2024 0936   WBC 7.8 05/24/2024 1448   RBC 4.58 08/24/2024 0936   RBC 4.25 05/24/2024 1448   HGB 13.7 08/24/2024 0936   HCT 43.8 08/24/2024 0936   PLT 375 08/24/2024 0936   MCV 96 08/24/2024 0936   MCH 29.9 08/24/2024 0936   MCH 29.2 05/26/2020 0841   MCHC 31.3 (L) 08/24/2024 0936   MCHC 34.0 05/24/2024 1448   RDW 13.0 08/24/2024 0936   LYMPHSABS 2.3 08/24/2024 0936   MONOABS 0.6 05/24/2024 1448   EOSABS 0.4 08/24/2024 0936   BASOSABS 0.0 08/24/2024 0936   Pre-Dm is diet and lifestyle controlled. Pt is not currently on any medications. A1C levels have decreased down 5.6. Fasting Insulin  levels should be below 5. Educated pt how insulin  resistance is normal for people who have prediabetes. Answered any pertinent questions. Continue following prudent meal plan and decreasing simple carbs and sugars.   Thyroid , kidney function and blood count  labs are all WNL. No acute concerns today.     Mixed hyperlipidemia Assessment & Plan Lab Results  Component Value Date   CHOL 222 (H) 08/24/2024   HDL 72 08/24/2024   LDLCALC 118 (H) 08/24/2024   LDLDIRECT 96.0 05/26/2019   TRIG 185 (H) 08/24/2024   CHOLHDL 3.1 08/24/2024   The 10-year ASCVD risk score (Arnett DK, et al., 2019) is: 1.1%   Values used to calculate the score:     Age: 48 years     Clincally relevant sex: Female     Is Non-Hispanic African American: No     Diabetic: No     Tobacco smoker: No     Systolic Blood Pressure: 132 mmHg  Is BP treated: Yes     HDL Cholesterol: 72 mg/dL     Total Cholesterol: 222 mg/dL  On Fenofibrate  160 mg daily and Lovaza  2 g twice daily. Good compliance and tolerance. Pts HDL levels have increased by 10 compared to 3 years ago. Goal levels are above 60. Trig levels have increased as well. Explained to pt how these levels will increase if she is eating fatty carbs. Continue reducing foods high in sat and trans fat and increasing exercise as tolerated.      Vitamin D  deficiency Assessment & Plan Lab Results  Component Value Date   VD25OH 53.6 08/24/2024   VD25OH 48.76 11/20/2023   VD25OH 57.5 09/17/2022   On OTC Vit D 2000 units daily. Good compliance and tolerance. Pts Vit D levels are at goal. Continue with supplementation. No acute concerns today. Will reassess in 3 to 4 months.      Mood disorder (HCC) with emotional eating Assessment & Plan On Buspar  15 mg twice daily, Rexulti  0.5 mg daily and Lexapro  20 mg daily. Good compliance and tolerance. Pt states that recently she has not been skipping meals but sometimes when she is stressed she will grab something sweet to snack on.  Reminded pt that Dr.Barker is available if she wishes to see her, which pt declines.  Pt endorses currently looking for a counselor for CBT which we endorse strongly for pt support    Essential hypertension Assessment & Plan BP Readings from  Last 3 Encounters:  09/07/24 132/87  09/06/24 120/82  08/24/24 115/75   The 10-year ASCVD risk score (Arnett DK, et al., 2019) is: 1.1%  Lab Results  Component Value Date   CREATININE 0.69 08/24/2024   On Cozaar  50 mg daily. Good compliance and tolerance. No acute concerns today. Pt BP is well controlled. Continue medications; continue diet/ lifestyle changes and will monitor alongside PCP/specialist along pt's wt loss journey.      FOLLOW UP:   Return 09/22/2024 at 3:20 PM  She was informed of the importance of frequent follow up visits to maximize her success with intensive lifestyle modifications for her multiple health conditions.   Weight Summary and Biometrics   Weight Lost Since Last Visit: 2lb  Weight Gained Since Last Visit: 0lb   Vitals Temp: 97.8 F (36.6 C) BP: 132/87 Pulse Rate: 81 SpO2: 97 %   Anthropometric Measurements Height: 5' 10 (1.778 m) Weight: 271 lb (122.9 kg) BMI (Calculated): 38.88 Weight at Last Visit: 273lb Weight Lost Since Last Visit: 2lb Weight Gained Since Last Visit: 0lb Starting Weight: 273lb Total Weight Loss (lbs): 2 lb (0.907 kg) Peak Weight: 273lb   Body Composition  Body Fat %: 41 % Fat Mass (lbs): 111.4 lbs Muscle Mass (lbs): 152.2 lbs Total Body Water (lbs): 106 lbs Visceral Fat Rating : 11   Other Clinical Data Fasting: No Labs: No Today's Visit #: 2 Starting Date: 08/24/24 Comments: NP FU     Objective:  Review of Systems:  Pertinent positives were addressed with patient today.   Reviewed by clinician on day of visit: allergies, medications, problem list, medical history, surgical history, family history, social history, and previous encounter notes.  PHYSICAL EXAM: Blood pressure 132/87, pulse 81, temperature 97.8 F (36.6 C), height 5' 10 (1.778 m), weight 271 lb (122.9 kg), SpO2 97%. Body mass index is 38.88 kg/m. General: she is overweight, cooperative and in no acute distress.   HEENT: EOMI,  sclerae are anicteric. Lungs: Normal breathing effort, no conversational  dyspnea. M-Sk:  Normal gross ROM * 4 extremities  PSYCH: Has normal mood, affect and thought process. Neurologic: No gross sensory or motor deficits. Well developed, A and O * 3  DIAGNOSTIC DATA REVIEWED:  BMET    Component Value Date/Time   NA 138 08/24/2024 0936   K 4.5 08/24/2024 0936   CL 103 08/24/2024 0936   CO2 20 08/24/2024 0936   GLUCOSE 72 08/24/2024 0936   GLUCOSE 138 (H) 05/24/2024 1448   BUN 15 08/24/2024 0936   CREATININE 0.69 08/24/2024 0936   CREATININE 0.87 05/26/2020 0841   CALCIUM 9.9 08/24/2024 0936   GFRNONAA >60 12/15/2018 1402   GFRAA >60 12/15/2018 1402   Lab Results  Component Value Date   HGBA1C 5.6 08/24/2024   HGBA1C 5.8 04/21/2013   Lab Results  Component Value Date   INSULIN  15.8 08/24/2024   INSULIN  16.3 05/17/2021   Lab Results  Component Value Date   TSH 1.440 08/24/2024   CBC    Component Value Date/Time   WBC 7.2 08/24/2024 0936   WBC 7.8 05/24/2024 1448   RBC 4.58 08/24/2024 0936   RBC 4.25 05/24/2024 1448   HGB 13.7 08/24/2024 0936   HCT 43.8 08/24/2024 0936   PLT 375 08/24/2024 0936   MCV 96 08/24/2024 0936   MCH 29.9 08/24/2024 0936   MCH 29.2 05/26/2020 0841   MCHC 31.3 (L) 08/24/2024 0936   MCHC 34.0 05/24/2024 1448   RDW 13.0 08/24/2024 0936   Iron Studies    Component Value Date/Time   IRON 82 05/30/2021 0830   FERRITIN 290.9 05/30/2021 0830   IRONPCTSAT 22.4 09/18/2017 1110   Lipid Panel     Component Value Date/Time   CHOL 222 (H) 08/24/2024 0936   TRIG 185 (H) 08/24/2024 0936   HDL 72 08/24/2024 0936   CHOLHDL 3.1 08/24/2024 0936   CHOLHDL 3 11/20/2023 0856   VLDL 25.8 11/20/2023 0856   LDLCALC 118 (H) 08/24/2024 0936   LDLCALC 115 (H) 05/26/2020 0841   LDLDIRECT 96.0 05/26/2019 0835   Hepatic Function Panel     Component Value Date/Time   PROT 7.4 08/24/2024 0936   ALBUMIN 4.2 08/24/2024 0936   AST 18 08/24/2024 0936    ALT 18 08/24/2024 0936   ALKPHOS 75 08/24/2024 0936   BILITOT 0.4 08/24/2024 0936   BILIDIR 0.1 05/26/2020 0841   IBILI 0.2 05/26/2020 0841      Component Value Date/Time   TSH 1.440 08/24/2024 0936   Nutritional Lab Results  Component Value Date   VD25OH 53.6 08/24/2024   VD25OH 48.76 11/20/2023   VD25OH 57.5 09/17/2022     Attestations:   I, Sonny Laroche, acting as a stage manager for Barnie Jenkins, DO., have compiled all relevant documentation for today's office visit on behalf of Barnie Jenkins, DO, while in the presence of [employer], DO.  I have spent 58 minutes in the care of the patient today.  48 minutes was spent on face-to-face counseling and reviewing listed medical problems above as outlined in office visit note, providing nutritional and behavioral counseling as outlined in obesity care plan, independently interpreting results and goals of care, see listed medical problems, and discussing biometric information and progress. We reviewed her meal plan and discussed how the foods she's eating is affecting each one of her labs. Pt educated on why we want her to eat various foods in various amounts and has a better understanding of the nutritional plan because of this. All questions  were answered today. 10 minutes was spent on pre-chart review and additional documentation after the visit.   I have reviewed the above documentation for accuracy and completeness, and I agree with the above. Barnie JINNY Jenkins, D.O.  The 21st Century Cures Act was signed into law in 2016 which includes the topic of electronic health records.  This provides immediate access to information in MyChart.  This includes consultation notes, operative notes, office notes, lab results and pathology reports.  If you have any questions about what you read please let us  know at your next visit so we can discuss your concerns and take corrective action if need be.  We are right here with you.

## 2024-09-07 NOTE — Assessment & Plan Note (Signed)
 6 dysfunction discussed home exercises, discussed which activities to do.  Increase activity slowly.  Discussed icing regimen.  Follow-up again in 6 to 8 weeks

## 2024-09-07 NOTE — Assessment & Plan Note (Signed)
 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  Discussed nerve flossing, compression, avoiding direct impact in the area, follow-up again 6 weeks.  Worsening pain injection may be necessary

## 2024-09-07 NOTE — Patient Instructions (Signed)
 The 10-year ASCVD risk score (Arnett DK, et al., 2019) is: 1.1%   Values used to calculate the score:     Age: 48 years     Clincally relevant sex: Female     Is Non-Hispanic African American: No     Diabetic: No     Tobacco smoker: No     Systolic Blood Pressure: 132 mmHg     Is BP treated: Yes     HDL Cholesterol: 72 mg/dL     Total Cholesterol: 222 mg/dL

## 2024-09-11 ENCOUNTER — Other Ambulatory Visit: Payer: Self-pay | Admitting: Internal Medicine

## 2024-09-11 DIAGNOSIS — F331 Major depressive disorder, recurrent, moderate: Secondary | ICD-10-CM

## 2024-09-13 ENCOUNTER — Other Ambulatory Visit: Payer: Self-pay

## 2024-09-14 ENCOUNTER — Encounter: Payer: Self-pay | Admitting: Internal Medicine

## 2024-09-15 ENCOUNTER — Other Ambulatory Visit: Payer: Self-pay

## 2024-09-15 ENCOUNTER — Other Ambulatory Visit (HOSPITAL_COMMUNITY): Payer: Self-pay

## 2024-09-15 DIAGNOSIS — F331 Major depressive disorder, recurrent, moderate: Secondary | ICD-10-CM

## 2024-09-15 MED ORDER — REXULTI 0.5 MG PO TABS
1.0000 | ORAL_TABLET | Freq: Every day | ORAL | 0 refills | Status: DC
  Filled 2024-09-15: qty 90, 90d supply, fill #0

## 2024-09-16 ENCOUNTER — Other Ambulatory Visit: Payer: Self-pay

## 2024-09-20 ENCOUNTER — Other Ambulatory Visit (HOSPITAL_COMMUNITY): Payer: Self-pay

## 2024-09-20 ENCOUNTER — Other Ambulatory Visit: Payer: Self-pay

## 2024-09-20 NOTE — Progress Notes (Signed)
 Specialty Pharmacy Refill Coordination Note  April [name] is a 48 y.o. female contacted today regarding refills of specialty medication(s) Certolizumab Pegol  (CIMZIA )   Patient requested Delivery   Delivery date: 09/29/24   Verified address: [address] [place], [address]   Medication will be filled on: 09/28/24

## 2024-09-22 ENCOUNTER — Ambulatory Visit (INDEPENDENT_AMBULATORY_CARE_PROVIDER_SITE_OTHER): Payer: Self-pay | Admitting: Family Medicine

## 2024-09-22 VITALS — BP 129/83 | HR 81 | Temp 98.3°F | Ht 70.0 in | Wt 267.0 lb

## 2024-09-22 DIAGNOSIS — E559 Vitamin D deficiency, unspecified: Secondary | ICD-10-CM

## 2024-09-22 DIAGNOSIS — E66812 Obesity, class 2: Secondary | ICD-10-CM

## 2024-09-22 DIAGNOSIS — R7303 Prediabetes: Secondary | ICD-10-CM

## 2024-09-22 DIAGNOSIS — I10 Essential (primary) hypertension: Secondary | ICD-10-CM

## 2024-09-22 DIAGNOSIS — Z6838 Body mass index (BMI) 38.0-38.9, adult: Secondary | ICD-10-CM

## 2024-09-22 NOTE — Progress Notes (Signed)
 April [name], D.O.  ABFM, ABOM Specializing in Clinical Bariatric Medicine  Office located at: [address] [place], [place]  [address]    FOR THE CHRONIC DISEASE OF OBESITY:   Class 2 obesity with body mass index (BMI) of 39.0 to 39.9 in adult, unspecified obesity type, start BMI 39.17 Obesity with current BMI of 38.31  Weight Summary and Body Composition Analysis  Weight Lost Since Last Visit: 4LB  Weight Gained Since Last Visit: 0LB    Vitals Temp: 98.3 F (36.8 C) BP: 129/83 Pulse Rate: 81 SpO2: 96 %   Anthropometric Measurements Height: 5' 10 (1.778 m) Weight: 267 lb (121.1 kg) BMI (Calculated): 38.31 Weight at Last Visit: 271LB Weight Lost Since Last Visit: 4LB Weight Gained Since Last Visit: 0LB Starting Weight: 273LB Total Weight Loss (lbs): 6 lb (2.722 kg) Peak Weight: 273LB   Body Composition  Body Fat %: 46.5 % Fat Mass (lbs): 124.4 lbs Muscle Mass (lbs): 135.8 lbs Total Body Water (lbs): 104.2 lbs Visceral Fat Rating : 13   Other Clinical Data Fasting: NO Labs: NO Today's Visit #: 3 Starting Date: 08/24/24    Chief complaint: Obesity  Interval History April [name] is here for a follow-up office visit to discuss her progress with her obesity treatment plan. April [name] is keeping a food journal and adhering to recommended goals of 1600 calories and 110+ grams protein and states April [name] is following her eating plan approximately 90 % of the time. April [name] is doing treadmill videos 20  minutes 3-4 days per week  April [name] has experienced a weight loss of 4 lbs since last OV on 09/07/2024.   Her dietary and life habits include:  - Tracking Calories/Macros: yes - in general April [name] was more over in calories than under.  - Eating More Whole Foods: yes  - Does not consume adequate protein for dinner  - Adequate Water Intake: no  - Skipping Meals: no  - Sleeping 7-9 Hours/ Night: yes    09/07/24 14:00 09/22/24 15:00   Body Fat % 41 % 46.5 %   Muscle Mass (lbs) 152.2 lbs 135.8 lbs  Fat Mass (lbs) 111.4 lbs 124.4 lbs  Total Body Water (lbs) 106 lbs 104.2 lbs  Visceral Fat Rating  11 13   Counseling done on how various foods will affect these numbers and how to maximize success  Total Fat mass lost to date: -5 lbs Total lbs lost to date: - 6 lbs Total weight loss percentage to date: - 2.20  %   Nutritional and Behavioral Counseling:  We discussed the following today: strategies to increase lean protein intake throughout the day,  healthy breakfast ideas, high protein cereals, high protein snacking choices, focusing on food with a 10:1 ratio of calories: grams of protein, increasing water intake ,working on implementation of reduced calorie nutritional plan, and using GPT or another AI platform for recipe ideas- searching low calorie, low carb, high protein chicken recipes etc  Additional resources provided today: Handout on healthy breakfast recipes.  Evidence-based interventions for health behavior change were utilized today including the discussion of self monitoring techniques, problem-solving barriers and SMART goal setting techniques.   Regarding patient's less desirable eating habits and patterns, we employed the technique of small changes.   SMART Goal(s) created today: n/a   Recommended Dietary Goals April [name] is currently in the action stage of change. As such, her goal is to continue weight management plan.  April [name] has agreed to continue journaling 1600 cal  and 110+ grams protein   Recommended Physical Activity Goals April [name] has been advised to work up to 300-450 minutes of moderate intensity aerobic activity a week and strengthening exercises 2-3 times per week for cardiovascular health, weight loss maintenance and preservation of muscle mass.   April [name] may continue to gradually increase the amount and intensity of exercise routine.   Medical Interventions and Pharmacotherapy Previous Bariatric surgery:  n/a  Pharmacotherapy: April [name] started compounded tirzepatide 2.5 mg weekly approximately 3 weeks ago, prescribed by a physician at [facility]. April [name] pays $200 per month. Denies gastrointestinal side effects. Reports decreased food noise and cravings, though April [name] notes that the effects diminish after 3-4 days. April [name] plans to discuss increasing dose with her prescribing physician in the near future.    OBESITY RELATED CONDITIONS ADDRESSED TODAY:    Prediabetes Assessment & Plan: Lab Results  Component Value Date   HGBA1C 5.6 08/24/2024   HGBA1C 5.9 05/24/2024   HGBA1C 5.8 11/20/2023   INSULIN  15.8 08/24/2024   INSULIN  11.8 09/17/2022   INSULIN  5.7 03/18/2022    Pre-DM managed with dietary and lifestyle interventions. Hunger and cravings are stable. No acute concerns. Continue working on nutrition plan to decrease simple carbohydrates, increase lean proteins and exercise to promote weight loss and prevent progression to T2DM.     Essential hypertension Assessment & Plan: BP Readings from Last 3 Encounters:  09/22/24 129/83  09/07/24 132/87  09/06/24 120/82   Blood pressure is controlled today. No acute concerns. Cont Cozaar  50 mg daily and low sodium diet. Advance exercise as able.    Vitamin D  deficiency Assessment & Plan: Lab Results  Component Value Date   VD25OH 53.6 08/24/2024   VD25OH 48.76 11/20/2023   VD25OH 57.5 09/17/2022   April [name] is doing well on OTC Vit D 2,000 units daily; continue supplementation. Recheck as deemed medically necessary.    Objective:   PHYSICAL EXAM: Blood pressure 129/83, pulse 81, temperature 98.3 F (36.8 C), height 5' 10 (1.778 m), weight 267 lb (121.1 kg), SpO2 96%. Body mass index is 38.31 kg/m.  General: April [name] is overweight, cooperative and in no acute distress. PSYCH: Has normal mood, affect and thought process.   HEENT: EOMI, sclerae are anicteric. Lungs: Normal breathing effort, no conversational dyspnea. Extremities: Moves *  4 Neurologic: A and O * 3, good insight  DIAGNOSTIC DATA REVIEWED: BMET    Component Value Date/Time   NA 138 08/24/2024 0936   K 4.5 08/24/2024 0936   CL 103 08/24/2024 0936   CO2 20 08/24/2024 0936   GLUCOSE 72 08/24/2024 0936   GLUCOSE 138 (H) 05/24/2024 1448   BUN 15 08/24/2024 0936   CREATININE 0.69 08/24/2024 0936   CREATININE 0.87 05/26/2020 0841   CALCIUM 9.9 08/24/2024 0936   GFRNONAA >60 12/15/2018 1402   GFRAA >60 12/15/2018 1402   Lab Results  Component Value Date   HGBA1C 5.6 08/24/2024   HGBA1C 5.8 04/21/2013   Lab Results  Component Value Date   INSULIN  15.8 08/24/2024   INSULIN  16.3 05/17/2021   Lab Results  Component Value Date   TSH 1.440 08/24/2024   CBC    Component Value Date/Time   WBC 7.2 08/24/2024 0936   WBC 7.8 05/24/2024 1448   RBC 4.58 08/24/2024 0936   RBC 4.25 05/24/2024 1448   HGB 13.7 08/24/2024 0936   HCT 43.8 08/24/2024 0936   PLT 375 08/24/2024 0936   MCV 96 08/24/2024 0936   MCH 29.9 08/24/2024 0936   MCH  29.2 05/26/2020 0841   MCHC 31.3 (L) 08/24/2024 0936   MCHC 34.0 05/24/2024 1448   RDW 13.0 08/24/2024 0936   Iron Studies    Component Value Date/Time   IRON 82 05/30/2021 0830   FERRITIN 290.9 05/30/2021 0830   IRONPCTSAT 22.4 09/18/2017 1110   Lipid Panel     Component Value Date/Time   CHOL 222 (H) 08/24/2024 0936   TRIG 185 (H) 08/24/2024 0936   HDL 72 08/24/2024 0936   CHOLHDL 3.1 08/24/2024 0936   CHOLHDL 3 11/20/2023 0856   VLDL 25.8 11/20/2023 0856   LDLCALC 118 (H) 08/24/2024 0936   LDLCALC 115 (H) 05/26/2020 0841   LDLDIRECT 96.0 05/26/2019 0835   Hepatic Function Panel     Component Value Date/Time   PROT 7.4 08/24/2024 0936   ALBUMIN 4.2 08/24/2024 0936   AST 18 08/24/2024 0936   ALT 18 08/24/2024 0936   ALKPHOS 75 08/24/2024 0936   BILITOT 0.4 08/24/2024 0936   BILIDIR 0.1 05/26/2020 0841   IBILI 0.2 05/26/2020 0841      Component Value Date/Time   TSH 1.440 08/24/2024 0936    Nutritional Lab Results  Component Value Date   VD25OH 53.6 08/24/2024   VD25OH 48.76 11/20/2023   VD25OH 57.5 09/17/2022     Follow up:   Return 10/06/2024 at 3:00 PM.  April [name] was informed of the importance of frequent follow up visits to maximize her success with intensive lifestyle modifications for her multiple health conditions.   Attestations:   I, Special Puri, acting as a stage manager for [employer], DO., have compiled all relevant documentation for today's office visit on behalf of April Jenkins, DO, while in the presence of [employer], DO.  Pertinent positives were addressed with patient today. Reviewed by clinician on day of visit: allergies, medications, problem list, medical history, surgical history, family history, social history, and previous encounter notes.  I have reviewed the above documentation for accuracy and completeness, and I agree with the above. April [name], D.O.  The 21st Century Cures Act was signed into law in 2016 which includes the topic of electronic health records.  This provides immediate access to information in MyChart. This includes consultation notes, operative notes, office notes, lab results and pathology reports.  If you have any questions about what you read please let us  know at your next visit so we can discuss your concerns and take corrective action if need be.  We are right here with you.

## 2024-09-24 ENCOUNTER — Other Ambulatory Visit: Payer: Self-pay | Admitting: Internal Medicine

## 2024-09-24 DIAGNOSIS — I10 Essential (primary) hypertension: Secondary | ICD-10-CM

## 2024-09-27 ENCOUNTER — Other Ambulatory Visit (HOSPITAL_COMMUNITY): Payer: Self-pay

## 2024-09-27 ENCOUNTER — Other Ambulatory Visit: Payer: Self-pay

## 2024-09-27 MED ORDER — FENOFIBRATE 160 MG PO TABS
160.0000 mg | ORAL_TABLET | Freq: Every day | ORAL | 0 refills | Status: AC
  Filled 2024-09-27: qty 90, 90d supply, fill #0

## 2024-09-27 MED ORDER — LOSARTAN POTASSIUM 50 MG PO TABS
50.0000 mg | ORAL_TABLET | Freq: Every day | ORAL | 0 refills | Status: AC
  Filled 2024-09-27: qty 90, 90d supply, fill #0

## 2024-09-28 ENCOUNTER — Other Ambulatory Visit: Payer: Self-pay

## 2024-10-06 ENCOUNTER — Ambulatory Visit (INDEPENDENT_AMBULATORY_CARE_PROVIDER_SITE_OTHER): Payer: Self-pay | Admitting: Family Medicine

## 2024-10-06 ENCOUNTER — Encounter (INDEPENDENT_AMBULATORY_CARE_PROVIDER_SITE_OTHER): Payer: Self-pay | Admitting: Family Medicine

## 2024-10-06 VITALS — BP 122/80 | HR 86 | Temp 99.1°F | Ht 70.0 in | Wt 261.0 lb

## 2024-10-06 DIAGNOSIS — E66812 Obesity, class 2: Secondary | ICD-10-CM

## 2024-10-06 DIAGNOSIS — E559 Vitamin D deficiency, unspecified: Secondary | ICD-10-CM | POA: Diagnosis not present

## 2024-10-06 DIAGNOSIS — R7303 Prediabetes: Secondary | ICD-10-CM

## 2024-10-06 DIAGNOSIS — I10 Essential (primary) hypertension: Secondary | ICD-10-CM | POA: Diagnosis not present

## 2024-10-06 DIAGNOSIS — Z6837 Body mass index (BMI) 37.0-37.9, adult: Secondary | ICD-10-CM | POA: Diagnosis not present

## 2024-10-06 NOTE — Progress Notes (Signed)
 April [name], D.O.  ABFM, ABOM Specializing in Clinical Bariatric Medicine  Office located at: [address] [place], [place]  [address]    FOR THE CHRONIC DISEASE OF OBESITY:   Class 2 obesity with body mass index (BMI) of 39.0 to 39.9 in adult, unspecified obesity type, start BMI 39.17 Obesity with current BMI of 37.45  Weight Summary and Body Composition Analysis  Weight Lost Since Last Visit: 6lb  Weight Gained Since Last Visit: 0lb    Vitals Temp: 99.1 F (37.3 C) BP: 122/80 Pulse Rate: 86 SpO2: 97 %   Anthropometric Measurements Height: 5' 10 (1.778 m) Weight: 261 lb (118.4 kg) BMI (Calculated): 37.45 Weight at Last Visit: 267lb Weight Lost Since Last Visit: 6lb Weight Gained Since Last Visit: 0lb Starting Weight: 273lb Total Weight Loss (lbs): 12 lb (5.443 kg) Peak Weight: 273lb   Body Composition  Body Fat %: 46.2 % Fat Mass (lbs): 120.8 lbs Muscle Mass (lbs): 133.8 lbs Total Body Water (lbs): 102.2 lbs Visceral Fat Rating : 12   Other Clinical Data Fasting: no Labs: no Today's Visit #: 4 Starting Date: 08/24/24    Chief complaint: Obesity  Interval History April [name] is here for a follow-up office visit to discuss her progress with her obesity treatment plan. She is keeping a food journal and adhering to recommended goals of 1600 calories and 110+ grams protein and states she is following her eating plan approximately 90 % of the time. She is doing treadmill videos + free weights 20-25  minutes 3-5 days per week  She has experienced a weight loss of 6 lbs since last OV on 09/22/2024.   Her dietary and life habits include:  - Tracking Calories/Macros: yes  - Eating More Whole Foods: yes  - Adequate Protein Intake: no  - Adequate Water Intake: yes  - Skipping Meals: no  - Sleeping 7-9 Hours/ Night: yes    09/22/24 15:00 10/06/24 14:00   Body Fat % 46.5 % 46.2 %  Muscle Mass (lbs) 135.8 lbs 133.8 lbs  Fat Mass  (lbs) 124.4 lbs 120.8 lbs  Total Body Water (lbs) 104.2 lbs 102.2 lbs  Visceral Fat Rating  13 12   Counseling done on how various foods will affect these numbers and how to maximize success  Total Fat mass lost to date: - 8.6 lbs Total lbs lost to date: - 12 lbs Total weight loss percentage to date:  - 4.40 %   Nutritional and Behavioral Counseling:  We discussed the following today: the concept of paying to play (intentionally increasing exercise on days they will deviate from meal plan) , increasing lean protein intake to established goals, high protein pasta/spaghetti options, high protein snacking choices, increasing fiber rich foods, increasing water intake , continue to work on implementation of reduced calorie nutritional plan, eating multiple small meals a day to get in all their foods, celebration eating strategies, and using GPT or another AI platform for recipe ideas- searching low calorie, low carb, high protein chicken recipes etc  Additional resources provided today: Handout on holiday eating strategies  Evidence-based interventions for health behavior change were utilized today including the discussion of self monitoring techniques, problem-solving barriers and SMART goal setting techniques.   Regarding patient's less desirable eating habits and patterns, we employed the technique of small changes.   SMART Goal(s) created today: increase lean protein intake   Recommended Dietary Goals April [name] is currently in the action stage of change. As such, her  goal is to continue weight management plan.  She has agreed to continue journaling 1600 cal and 110+ grams protein   Recommended Physical Activity Goals April [name] has been advised to work up to 300-450 minutes of moderate intensity aerobic activity a week and strengthening exercises 2-3 times per week for cardiovascular health, weight loss maintenance and preservation of muscle mass.   She may Continue to gradually increase  the amount and intensity of exercise routine   Medical Interventions and Pharmacotherapy Previous Bariatric surgery: n/a  Pharmacotherapy: She started compounded tirzepatide 2.5 mg approximately 5 weeks ago, prescribed by a physician at [facility]. Constipation controlled on daily Miralax. Reports decreased food noise and cravings, though she notes that the effects diminish after 3-4 days.  She plans to discuss increasing the tirzepatide dose to 5 mg with her prescribing physician this Friday. Adequate daily water and fiber intake encouraged along with activity/ movement    OBESITY RELATED CONDITIONS ADDRESSED TODAY:    Prediabetes Assessment & Plan: Lab Results  Component Value Date   HGBA1C 5.6 08/24/2024   HGBA1C 5.9 05/24/2024   HGBA1C 5.8 11/20/2023   INSULIN  15.8 08/24/2024   INSULIN  11.8 09/17/2022   INSULIN  5.7 03/18/2022    Pre-DM managed with dietary and lifestyle interventions. Hunger and cravings are controlled. Cont working on nutrition plan to decrease simple carbohydrates, increase lean proteins and exercise to promote weight loss and prevent progression to T2DM.     Essential hypertension Assessment & Plan: BP Readings from Last 3 Encounters:  10/06/24 122/80  09/22/24 129/83  09/07/24 132/87   BP at goal today. No acute concerns. Maintain w/ Cozaar  50 mg daily and low sodium diet. Advance exercise as able.    Vitamin D  deficiency Assessment & Plan: Lab Results  Component Value Date   VD25OH 53.6 08/24/2024   VD25OH 48.76 11/20/2023   VD25OH 57.5 09/17/2022   On OTC Vit D 2,000 units daily with reported good compliance and tolerance; cont regimen. Recheck as deemed medically necessary    Objective:   PHYSICAL EXAM: Blood pressure 122/80, pulse 86, temperature 99.1 F (37.3 C), height 5' 10 (1.778 m), weight 261 lb (118.4 kg), SpO2 97%. Body mass index is 37.45 kg/m.  General: she is overweight, cooperative and in no acute distress. PSYCH:  Has normal mood, affect and thought process.   HEENT: EOMI, sclerae are anicteric. Lungs: Normal breathing effort, no conversational dyspnea. Extremities: Moves * 4 Neurologic: A and O * 3, good insight  DIAGNOSTIC DATA REVIEWED: BMET    Component Value Date/Time   NA 138 08/24/2024 0936   K 4.5 08/24/2024 0936   CL 103 08/24/2024 0936   CO2 20 08/24/2024 0936   GLUCOSE 72 08/24/2024 0936   GLUCOSE 138 (H) 05/24/2024 1448   BUN 15 08/24/2024 0936   CREATININE 0.69 08/24/2024 0936   CREATININE 0.87 05/26/2020 0841   CALCIUM 9.9 08/24/2024 0936   GFRNONAA >60 12/15/2018 1402   GFRAA >60 12/15/2018 1402   Lab Results  Component Value Date   HGBA1C 5.6 08/24/2024   HGBA1C 5.8 04/21/2013   Lab Results  Component Value Date   INSULIN  15.8 08/24/2024   INSULIN  16.3 05/17/2021   Lab Results  Component Value Date   TSH 1.440 08/24/2024   CBC    Component Value Date/Time   WBC 7.2 08/24/2024 0936   WBC 7.8 05/24/2024 1448   RBC 4.58 08/24/2024 0936   RBC 4.25 05/24/2024 1448   HGB 13.7 08/24/2024 0936  HCT 43.8 08/24/2024 0936   PLT 375 08/24/2024 0936   MCV 96 08/24/2024 0936   MCH 29.9 08/24/2024 0936   MCH 29.2 05/26/2020 0841   MCHC 31.3 (L) 08/24/2024 0936   MCHC 34.0 05/24/2024 1448   RDW 13.0 08/24/2024 0936   Iron Studies    Component Value Date/Time   IRON 82 05/30/2021 0830   FERRITIN 290.9 05/30/2021 0830   IRONPCTSAT 22.4 09/18/2017 1110   Lipid Panel     Component Value Date/Time   CHOL 222 (H) 08/24/2024 0936   TRIG 185 (H) 08/24/2024 0936   HDL 72 08/24/2024 0936   CHOLHDL 3.1 08/24/2024 0936   CHOLHDL 3 11/20/2023 0856   VLDL 25.8 11/20/2023 0856   LDLCALC 118 (H) 08/24/2024 0936   LDLCALC 115 (H) 05/26/2020 0841   LDLDIRECT 96.0 05/26/2019 0835   Hepatic Function Panel     Component Value Date/Time   PROT 7.4 08/24/2024 0936   ALBUMIN 4.2 08/24/2024 0936   AST 18 08/24/2024 0936   ALT 18 08/24/2024 0936   ALKPHOS 75 08/24/2024  0936   BILITOT 0.4 08/24/2024 0936   BILIDIR 0.1 05/26/2020 0841   IBILI 0.2 05/26/2020 0841      Component Value Date/Time   TSH 1.440 08/24/2024 0936   Nutritional Lab Results  Component Value Date   VD25OH 53.6 08/24/2024   VD25OH 48.76 11/20/2023   VD25OH 57.5 09/17/2022     Follow up:   Return 10/27/2024 at 10:40 AM.  She was informed of the importance of frequent follow up visits to maximize her success with intensive lifestyle modifications for her multiple health conditions.   Attestations:   I, Special Puri, acting as a stage manager for [employer], DO., have compiled all relevant documentation for today's office visit on behalf of April Jenkins, DO, while in the presence of [employer], DO.  Pertinent positives were addressed with patient today. Reviewed by clinician on day of visit: allergies, medications, problem list, medical history, surgical history, family history, social history, and previous encounter notes.  I have reviewed the above documentation for accuracy and completeness, and I agree with the above. April JINNY [name], D.O.  The 21st Century Cures Act was signed into law in 2016 which includes the topic of electronic health records.  This provides immediate access to information in MyChart. This includes consultation notes, operative notes, office notes, lab results and pathology reports.  If you have any questions about what you read please let us  know at your next visit so we can discuss your concerns and take corrective action if need be.  We are right here with you.

## 2024-10-07 ENCOUNTER — Other Ambulatory Visit: Payer: Self-pay | Admitting: Internal Medicine

## 2024-10-07 ENCOUNTER — Other Ambulatory Visit: Payer: Self-pay

## 2024-10-07 ENCOUNTER — Other Ambulatory Visit (HOSPITAL_COMMUNITY): Payer: Self-pay

## 2024-10-07 MED ORDER — ESOMEPRAZOLE MAGNESIUM 40 MG PO CPDR
40.0000 mg | DELAYED_RELEASE_CAPSULE | Freq: Every day | ORAL | 1 refills | Status: AC
  Filled 2024-10-07: qty 90, 90d supply, fill #0

## 2024-10-19 ENCOUNTER — Other Ambulatory Visit: Payer: Self-pay

## 2024-10-20 ENCOUNTER — Ambulatory Visit (INDEPENDENT_AMBULATORY_CARE_PROVIDER_SITE_OTHER): Payer: Self-pay | Admitting: Family Medicine

## 2024-10-21 ENCOUNTER — Other Ambulatory Visit (HOSPITAL_COMMUNITY): Payer: Self-pay

## 2024-10-21 ENCOUNTER — Other Ambulatory Visit: Payer: Self-pay

## 2024-10-21 NOTE — Progress Notes (Signed)
 Specialty Pharmacy Refill Coordination Note  April [name] is a 48 y.o. female contacted today regarding refills of specialty medication(s) Certolizumab Pegol  (CIMZIA )   Patient requested Delivery   Delivery date: 11/04/24   Verified address: [address] [place], [address]   Medication will be filled on: 11/03/24

## 2024-10-27 ENCOUNTER — Ambulatory Visit (INDEPENDENT_AMBULATORY_CARE_PROVIDER_SITE_OTHER): Admitting: Family Medicine

## 2024-10-27 ENCOUNTER — Encounter (INDEPENDENT_AMBULATORY_CARE_PROVIDER_SITE_OTHER): Payer: Self-pay | Admitting: Family Medicine

## 2024-10-27 VITALS — BP 111/72 | HR 83 | Temp 98.3°F | Ht 70.0 in | Wt 253.0 lb

## 2024-10-27 DIAGNOSIS — Z6836 Body mass index (BMI) 36.0-36.9, adult: Secondary | ICD-10-CM

## 2024-10-27 DIAGNOSIS — E559 Vitamin D deficiency, unspecified: Secondary | ICD-10-CM | POA: Diagnosis not present

## 2024-10-27 DIAGNOSIS — E66812 Obesity, class 2: Secondary | ICD-10-CM | POA: Diagnosis not present

## 2024-10-27 DIAGNOSIS — I10 Essential (primary) hypertension: Secondary | ICD-10-CM

## 2024-10-27 DIAGNOSIS — R7303 Prediabetes: Secondary | ICD-10-CM | POA: Diagnosis not present

## 2024-10-27 DIAGNOSIS — Z6839 Body mass index (BMI) 39.0-39.9, adult: Secondary | ICD-10-CM

## 2024-10-27 NOTE — Progress Notes (Signed)
 April [name], D.O.  ABFM, ABOM Specializing in Clinical Bariatric Medicine  Office located at: [address] [place], [place]  [address]    FOR THE CHRONIC DISEASE OF OBESITY:   Class 2 obesity with body mass index (BMI) of 39.0 to 39.9 in adult, unspecified obesity type, start BMI 39.17; BMI 36.0-36.9,adult - current BMI 36.3  Weight Summary and Body Composition Analysis  Weight Lost Since Last Visit: 8 lb  No data recorded   Vitals Temp: 98.3 F (36.8 C) BP: 111/72 Pulse Rate: 83 SpO2: 98 %   Anthropometric Measurements Height: 5' 10 (1.778 m) Weight: 253 lb (114.8 kg) BMI (Calculated): 36.3 Weight at Last Visit: 261 lb Weight Lost Since Last Visit: 8 lb Starting Weight: 273 lb Total Weight Loss (lbs): 20 lb (9.072 kg) Peak Weight: 273 lb   Body Composition  Body Fat %: 44.6 % Fat Mass (lbs): 113.2 lbs Muscle Mass (lbs): 133.6 lbs Total Body Water (lbs): 95.8 lbs Visceral Fat Rating : 12   Other Clinical Data Fasting: No Labs: No Today's Visit #: 5 Starting Date: 08/24/24    Chief complaint: Obesity  Interval History April [name] is here for a follow-up office visit to discuss her progress with her obesity treatment plan. She is keeping a food journal and adhering to recommended goals of 1500 - 1600 calories and 110+ grams protein and states she is following her eating plan approximately 60 % of the time. She is exercising on the treadmill and using workout videos 20-25  minutes 3-4 days per week  She has experienced a weight loss of 8 lbs since last OV on 10/06/2024.   Her dietary and life habits include:  - Tracking Calories/Macros: yes  - Eating More Whole Foods: no  - Adequate Protein Intake: yes  - Adequate Water Intake: no  - Skipping Meals: no  - Sleeping 7-9 Hours/ Night: yes    10/06/24 14:00 10/27/24 10:00   Body Fat % 46.2 % 44.6 %  Muscle Mass (lbs) 133.8 lbs 133.6 lbs  Fat Mass (lbs) 120.8 lbs 113.2 lbs   Total Body Water (lbs) 102.2 lbs 95.8 lbs  Visceral Fat Rating  12 12   Counseling done on how various foods will affect these numbers and how to maximize success  Total Fat mass lost to date: - 16.2 lbs Total lbs lost to date: - 20 lbs Total weight loss percentage to date: - 7.33 %   Nutritional and Behavioral Counseling:  We discussed the following today: increasing lean protein intake to established goals, focusing on food with a 10:1 ratio of calories: grams of protein, importance of having a variety of fruits and vegetables, and work on tracking and journaling calories using tracking application  Additional resources provided today: Handout on Daily Food Journaling Log and Handout on December Goals  Evidence-based interventions for health behavior change were utilized today including the discussion of self monitoring techniques, problem-solving barriers and SMART goal setting techniques.   Regarding patient's less desirable eating habits and patterns, we employed the technique of small changes.   SMART Goal(s) created today: lose weight through the holidays    Recommended Dietary Goals April [name] is currently in the action stage of change. As such, her goal is to continue weight management plan.  She has agreed to continue journaling 1500-1600 cal and 110+ grams protein.   Recommended Physical Activity Goals April [name] has been advised to work up to 300-450 minutes of moderate intensity aerobic activity a  week and strengthening exercises 2-3 times per week for cardiovascular health, weight loss maintenance and preservation of muscle mass.   She may continue to gradually increase the amount and intensity of exercise routine.   Medical Interventions and Pharmacotherapy Previous Bariatric surgery: n/a  Pharmacotherapy: She is on compounded tirzepatide 5 mg weekly, prescribed by a physician at [facility]. Increased 2.5 mg -> 5 mg about 4 weeks ago. Constipation controlled on daily  prn Miralax. Good control of food noise, cravings, and hunger. She is able to get in her meal plan foods. Recommend she discuss with [facility] physician about increasing to 7.5 mg dose for further weight loss. Reviewed anticipated benefits, possible risks. Cont maintaining healthy lifestyle habits, including balanced nutrition, regular physical activity, and behavioral modifications.   OBESITY RELATED CONDITIONS ADDRESSED TODAY:    Prediabetes Assessment & Plan: Lab Results  Component Value Date   HGBA1C 5.6 08/24/2024   HGBA1C 5.9 05/24/2024   HGBA1C 5.8 11/20/2023   INSULIN  15.8 08/24/2024   INSULIN  11.8 09/17/2022   INSULIN  5.7 03/18/2022   Pre-DM managed with dietary and lifestyle interventions. Hunger and cravings are controlled. Cont working on nutrition plan to decrease simple carbohydrates, increase lean proteins and exercise to promote weight loss and prevent progression to T2DM.     Essential hypertension Assessment & Plan: BP Readings from Last 3 Encounters:  10/27/24 111/72  10/06/24 122/80  09/22/24 129/83   BP at goal today. Cont Losartan  50 mg daily and low sodium diet. Increase exercise as able.    Vitamin D  deficiency Assessment & Plan: Lab Results  Component Value Date   VD25OH 53.6 08/24/2024   VD25OH 48.76 11/20/2023   VD25OH 57.5 09/17/2022   On OTC Vit D 2,000 units daily with reported good compliance and tolerance; cont regimen. Recheck as deemed medically necessary.   Objective:   PHYSICAL EXAM: Blood pressure 111/72, pulse 83, temperature 98.3 F (36.8 C), height 5' 10 (1.778 m), weight 253 lb (114.8 kg), SpO2 98%. Body mass index is 36.3 kg/m.  General: she is overweight, cooperative and in no acute distress. PSYCH: Has normal mood, affect and thought process.   HEENT: EOMI, sclerae are anicteric. Lungs: Normal breathing effort, no conversational dyspnea. Extremities: Moves * 4 Neurologic: A and O * 3, good insight  DIAGNOSTIC DATA  REVIEWED: BMET    Component Value Date/Time   NA 138 08/24/2024 0936   K 4.5 08/24/2024 0936   CL 103 08/24/2024 0936   CO2 20 08/24/2024 0936   GLUCOSE 72 08/24/2024 0936   GLUCOSE 138 (H) 05/24/2024 1448   BUN 15 08/24/2024 0936   CREATININE 0.69 08/24/2024 0936   CREATININE 0.87 05/26/2020 0841   CALCIUM 9.9 08/24/2024 0936   GFRNONAA >60 12/15/2018 1402   GFRAA >60 12/15/2018 1402   Lab Results  Component Value Date   HGBA1C 5.6 08/24/2024   HGBA1C 5.8 04/21/2013   Lab Results  Component Value Date   INSULIN  15.8 08/24/2024   INSULIN  16.3 05/17/2021   Lab Results  Component Value Date   TSH 1.440 08/24/2024   CBC    Component Value Date/Time   WBC 7.2 08/24/2024 0936   WBC 7.8 05/24/2024 1448   RBC 4.58 08/24/2024 0936   RBC 4.25 05/24/2024 1448   HGB 13.7 08/24/2024 0936   HCT 43.8 08/24/2024 0936   PLT 375 08/24/2024 0936   MCV 96 08/24/2024 0936   MCH 29.9 08/24/2024 0936   MCH 29.2 05/26/2020 0841   MCHC 31.3 (  L) 08/24/2024 0936   MCHC 34.0 05/24/2024 1448   RDW 13.0 08/24/2024 0936   Iron Studies    Component Value Date/Time   IRON 82 05/30/2021 0830   FERRITIN 290.9 05/30/2021 0830   IRONPCTSAT 22.4 09/18/2017 1110   Lipid Panel     Component Value Date/Time   CHOL 222 (H) 08/24/2024 0936   TRIG 185 (H) 08/24/2024 0936   HDL 72 08/24/2024 0936   CHOLHDL 3.1 08/24/2024 0936   CHOLHDL 3 11/20/2023 0856   VLDL 25.8 11/20/2023 0856   LDLCALC 118 (H) 08/24/2024 0936   LDLCALC 115 (H) 05/26/2020 0841   LDLDIRECT 96.0 05/26/2019 0835   Hepatic Function Panel     Component Value Date/Time   PROT 7.4 08/24/2024 0936   ALBUMIN 4.2 08/24/2024 0936   AST 18 08/24/2024 0936   ALT 18 08/24/2024 0936   ALKPHOS 75 08/24/2024 0936   BILITOT 0.4 08/24/2024 0936   BILIDIR 0.1 05/26/2020 0841   IBILI 0.2 05/26/2020 0841      Component Value Date/Time   TSH 1.440 08/24/2024 0936   Nutritional Lab Results  Component Value Date   VD25OH 53.6  08/24/2024   VD25OH 48.76 11/20/2023   VD25OH 57.5 09/17/2022     Follow up:   Return 11/23/2024 at 12:00 PM (arrive by 11:45 AM).  She was informed of the importance of frequent follow up visits to maximize her success with intensive lifestyle modifications for her multiple health conditions.   Attestations:   I, Special Puri, acting as a stage manager for [employer], DO., have compiled all relevant documentation for today's office visit on behalf of April Jenkins, DO, while in the presence of [employer], DO.  Pertinent positives were addressed with patient today. Reviewed by clinician on day of visit: allergies, medications, problem list, medical history, surgical history, family history, social history, and previous encounter notes.  I have reviewed the above documentation for accuracy and completeness, and I agree with the above. April [name], D.O.  The 21st Century Cures Act was signed into law in 2016 which includes the topic of electronic health records.  This provides immediate access to information in MyChart. This includes consultation notes, operative notes, office notes, lab results and pathology reports.  If you have any questions about what you read please let us  know at your next visit so we can discuss your concerns and take corrective action if need be.  We are right here with you.

## 2024-11-03 ENCOUNTER — Other Ambulatory Visit: Payer: Self-pay

## 2024-11-09 ENCOUNTER — Other Ambulatory Visit (HOSPITAL_COMMUNITY): Payer: Self-pay

## 2024-11-09 NOTE — Progress Notes (Unsigned)
 " [name] [address] [place] [address] Phone: [phone] Subjective:   April [name], am serving as a scribe for Dr. Arthea Claudene.  I'm seeing this patient by the request  of:  April Debby CROME, MD  CC: back and neck pain follow up   YEP:Dlagzrupcz  April [name] is a 48 y.o. female coming in with complaint of back and neck pain. OMT on 09/06/2024. Also seen for elbow pain. Patient states that her SI joints are painful today.   Elbow is improving. Painful intermittently throughout the day.   Medications patient has been prescribed:   Taking:         Reviewed prior external information including notes and imaging from previsou exam, outside providers and external EMR if available.   As well as notes that were available from care everywhere and other healthcare systems.  Past medical history, social, surgical and family history all reviewed in electronic medical record.  No pertanent information unless stated regarding to the chief complaint.   Past Medical History:  Diagnosis Date   ALLERGIC RHINITIS    Allergies    Anal fissure    Anxiety    ANXIETY DEPRESSION    Back pain    Boils    CONSTIPATION    Constipation    DEPRESSION    Esophageal reflux    Food allergy    Avacados   Gastritis    GERD (gastroesophageal reflux disease)    HYPERCHOLESTEROLEMIA    Hyperlipidemia    Hypertension    Joint pain    OBESITY    Occipital neuralgia    Palpitations    Plantar fasciitis    Prediabetes    Psoriasis    Resting tremor    Rheumatoid arthritis (HCC) 12/24/2017   SOB (shortness of breath) on exertion    TMJ (dislocation of temporomandibular joint)     Allergies[1]   Review of Systems:  No headache, visual changes, nausea, vomiting, diarrhea, constipation, dizziness, abdominal pain, skin rash, fevers, chills, night sweats, weight loss, swollen lymph nodes, body aches, joint swelling, chest pain, shortness  of breath, mood changes. POSITIVE muscle aches  Objective  Blood pressure 122/78, pulse 89, height 5' 10 (1.778 m), weight 255 lb (115.7 kg), SpO2 98%.   General: No apparent distress alert and oriented x3 mood and affect normal, dressed appropriately.  HEENT: Pupils equal, extraocular movements intact  Respiratory: Patient's speak in full sentences and does not appear short of breath  Cardiovascular: No lower extremity edema, non tender, no erythema  Gait MSK:  Back does have some mild loss of lordosis.  Significant stiffness noted in the paraspinal musculature.  Seems to be the right greater than the left SI joint.  Osteopathic findings  C2 flexed rotated and side bent right C6 flexed rotated and side bent left T3 extended rotated and side bent right inhaled rib T9 extended rotated and side bent left L2 flexed rotated and side bent right L4 flexed rotated and side bent right L5 flexed rotated and side bent left Sacrum right on right       Assessment and Plan:  No problem-specific Assessment & Plan notes found for this encounter.    Nonallopathic problems  Decision today to treat with OMT was based on Physical Exam  After verbal consent patient was treated with HVLA, ME, FPR techniques in cervical, rib, thoracic, lumbar, and sacral  areas  Patient tolerated the procedure well with improvement in symptoms  Patient given exercises, stretches and lifestyle modifications  See medications in patient instructions if given  Patient will follow up in 4-8 weeks    The above documentation has been reviewed and is accurate and complete Michail Boyte M Yonael Tulloch, DO          Note: This dictation was prepared with Dragon dictation along with smaller phrase technology. Any transcriptional errors that result from this process are unintentional.            [1]  Allergies Allergen Reactions   Ciprofloxacin     toxicity   Levaquin  [Levofloxacin ] Anxiety    Possible CNS side  effects. TOXICITY   Moxifloxacin     TOXICITY   Avocado     Other reaction(s): Unknown   "

## 2024-11-12 ENCOUNTER — Ambulatory Visit: Payer: Commercial Managed Care - PPO | Admitting: Obstetrics and Gynecology

## 2024-11-12 ENCOUNTER — Other Ambulatory Visit: Payer: Self-pay

## 2024-11-15 ENCOUNTER — Other Ambulatory Visit (HOSPITAL_COMMUNITY): Payer: Self-pay

## 2024-11-15 ENCOUNTER — Ambulatory Visit: Admitting: Family Medicine

## 2024-11-15 VITALS — BP 122/78 | HR 89 | Ht 70.0 in | Wt 255.0 lb

## 2024-11-15 DIAGNOSIS — M9908 Segmental and somatic dysfunction of rib cage: Secondary | ICD-10-CM | POA: Diagnosis not present

## 2024-11-15 DIAGNOSIS — M9904 Segmental and somatic dysfunction of sacral region: Secondary | ICD-10-CM

## 2024-11-15 DIAGNOSIS — M9903 Segmental and somatic dysfunction of lumbar region: Secondary | ICD-10-CM

## 2024-11-15 DIAGNOSIS — M9902 Segmental and somatic dysfunction of thoracic region: Secondary | ICD-10-CM | POA: Diagnosis not present

## 2024-11-15 DIAGNOSIS — M533 Sacrococcygeal disorders, not elsewhere classified: Secondary | ICD-10-CM

## 2024-11-15 DIAGNOSIS — M9901 Segmental and somatic dysfunction of cervical region: Secondary | ICD-10-CM

## 2024-11-15 NOTE — Assessment & Plan Note (Signed)
 Chronic, with exacerbation.  Do think that there was a potential for any muscle energy that did continue to do to give some discomfort noted.  Discussed icing regimen and home exercises otherwise.  Slowly.  Follow-up again in 6 to 12 weeks

## 2024-11-15 NOTE — Patient Instructions (Signed)
 Great to see you Can't help you with hubby See me in 6-8 weeks

## 2024-11-16 ENCOUNTER — Other Ambulatory Visit: Payer: Self-pay

## 2024-11-16 ENCOUNTER — Other Ambulatory Visit (HOSPITAL_COMMUNITY): Payer: Self-pay

## 2024-11-16 ENCOUNTER — Encounter: Payer: Self-pay | Admitting: Family Medicine

## 2024-11-16 ENCOUNTER — Other Ambulatory Visit: Payer: Self-pay | Admitting: Internal Medicine

## 2024-11-16 MED ORDER — METHOTREXATE SODIUM CHEMO INJECTION 50 MG/2ML
25.0000 mg | INTRAMUSCULAR | 1 refills | Status: AC
  Filled 2024-11-16: qty 12, 84d supply, fill #0

## 2024-11-19 ENCOUNTER — Other Ambulatory Visit (HOSPITAL_COMMUNITY): Payer: Self-pay

## 2024-11-19 MED ORDER — METHOTREXATE SODIUM CHEMO INJECTION 50 MG/2ML
25.0000 mg | INTRAMUSCULAR | 0 refills | Status: AC
  Filled 2024-11-19: qty 12, 84d supply, fill #0

## 2024-11-20 ENCOUNTER — Other Ambulatory Visit (HOSPITAL_COMMUNITY): Payer: Self-pay

## 2024-11-22 ENCOUNTER — Other Ambulatory Visit (HOSPITAL_COMMUNITY): Payer: Self-pay

## 2024-11-22 MED ORDER — ESCITALOPRAM OXALATE 20 MG PO TABS
20.0000 mg | ORAL_TABLET | Freq: Every day | ORAL | 0 refills | Status: AC
  Filled 2024-11-22: qty 90, 90d supply, fill #0

## 2024-11-23 ENCOUNTER — Ambulatory Visit (INDEPENDENT_AMBULATORY_CARE_PROVIDER_SITE_OTHER): Admitting: Family Medicine

## 2024-11-23 ENCOUNTER — Other Ambulatory Visit (HOSPITAL_COMMUNITY): Payer: Self-pay

## 2024-11-23 ENCOUNTER — Encounter (INDEPENDENT_AMBULATORY_CARE_PROVIDER_SITE_OTHER): Payer: Self-pay | Admitting: Family Medicine

## 2024-11-23 ENCOUNTER — Encounter: Payer: Self-pay | Admitting: Obstetrics and Gynecology

## 2024-11-23 ENCOUNTER — Other Ambulatory Visit: Payer: Self-pay

## 2024-11-23 ENCOUNTER — Ambulatory Visit (INDEPENDENT_AMBULATORY_CARE_PROVIDER_SITE_OTHER): Admitting: Obstetrics and Gynecology

## 2024-11-23 VITALS — BP 109/72 | HR 78 | Temp 97.7°F | Ht 70.0 in | Wt 245.0 lb

## 2024-11-23 VITALS — BP 122/80 | HR 93 | Ht 70.0 in | Wt 247.0 lb

## 2024-11-23 DIAGNOSIS — D229 Melanocytic nevi, unspecified: Secondary | ICD-10-CM

## 2024-11-23 DIAGNOSIS — Z8249 Family history of ischemic heart disease and other diseases of the circulatory system: Secondary | ICD-10-CM | POA: Diagnosis not present

## 2024-11-23 DIAGNOSIS — I10 Essential (primary) hypertension: Secondary | ICD-10-CM | POA: Diagnosis not present

## 2024-11-23 DIAGNOSIS — E2839 Other primary ovarian failure: Secondary | ICD-10-CM

## 2024-11-23 DIAGNOSIS — R7303 Prediabetes: Secondary | ICD-10-CM

## 2024-11-23 DIAGNOSIS — Z6835 Body mass index (BMI) 35.0-35.9, adult: Secondary | ICD-10-CM | POA: Diagnosis not present

## 2024-11-23 DIAGNOSIS — N898 Other specified noninflammatory disorders of vagina: Secondary | ICD-10-CM | POA: Diagnosis not present

## 2024-11-23 DIAGNOSIS — Z6836 Body mass index (BMI) 36.0-36.9, adult: Secondary | ICD-10-CM

## 2024-11-23 DIAGNOSIS — Z1231 Encounter for screening mammogram for malignant neoplasm of breast: Secondary | ICD-10-CM

## 2024-11-23 DIAGNOSIS — Z01419 Encounter for gynecological examination (general) (routine) without abnormal findings: Secondary | ICD-10-CM

## 2024-11-23 DIAGNOSIS — Z1331 Encounter for screening for depression: Secondary | ICD-10-CM

## 2024-11-23 DIAGNOSIS — E66812 Obesity, class 2: Secondary | ICD-10-CM

## 2024-11-23 MED ORDER — FLUCONAZOLE 150 MG PO TABS
150.0000 mg | ORAL_TABLET | Freq: Once | ORAL | 3 refills | Status: AC
  Filled 2024-11-23: qty 3, 3d supply, fill #0

## 2024-11-23 MED ORDER — ETONOGESTREL-ETHINYL ESTRADIOL 0.12-0.015 MG/24HR VA RING
1.0000 | VAGINAL_RING | VAGINAL | 4 refills | Status: AC
  Filled 2024-11-23: qty 3, 84d supply, fill #0

## 2024-11-23 NOTE — Progress Notes (Signed)
 "  Barnie DOROTHA Jenkins, D.O.  ABFM, ABOM Specializing in Clinical Bariatric Medicine  Office located at: [address] [place], [place]  [address]      A) FOR THE CHRONIC DISEASE OF OBESITY:  Chief complaint: Obesity April [name] is here to discuss her progress with her obesity treatment plan.   History of present illness / Interval history:  April [name] is here today for her follow-up office visit.  Since last OV on 10/27/24, pt is down 8 lbs. Patient states that the holidays have her messed up but after the holidays she has been very diligent.    10/27/24 10:00 11/23/24   Body Fat % 44.6 % 44.1 %  Muscle Mass (lbs) 133.6 lbs 130.2 lbs  Fat Mass (lbs) 113.2 lbs 108.2 lbs  Total Body Water (lbs) 95.8 lbs 95.6 lbs  Visceral Fat Rating  12 11   Counseling done on how various foods will affect these numbers and how to maximize success  Total lbs lost to date: - 28 lbs Total Fat Mass in lbs lost to date: - 2.8 lbs Total weight loss percentage to date: - 10.26 %    Class 2 obesity with (BMI) of 39.0 to 39.9 start BMI 39.17 BMI 36.0-36.9,adult - current BMI 35.15  Nutrition Therapy She is  journaling 1500-1600 cal and 110+ grams protein and states she is following her eating plan approximately 40 % of the time.   - Tracking Calories/Macros: yes  - Eating More Whole Foods: no   - Adequate Protein Intake: yes  - Adequate Water Intake: no   - Skipping Meals: no   - Sleeping 7-9 Hours/ Night: yes   April [name] is currently in the action stage of change. As such, her goal is to continue weight management plan.  She has agreed to: continue current plan   Physical Activity April [name] is on the treadmill and exercise videos 30 to 40  minutes 3 to 4 days per week   April [name] has been advised to work up to 300-450 minutes of moderate intensity aerobic activity a week and strengthening exercises 2-3 times per week for cardiovascular health, weight loss maintenance and  preservation of muscle mass.  She has agreed to : Combine aerobic and strengthening exercises for efficiency and improved cardiometabolic health.   Behavioral Modifications Evidence-based interventions for health behavior change were utilized today including the discussion of   Regarding patient's less desirable eating habits and patterns, we employed the technique of small changes.   We discussed the following today: increasing lean protein intake to established goals, reading food labels , keeping healthy foods at home, planning for success, and better snacking choices Additional resources provided today: Handout on balanced plate concepts.     Medical Interventions/ Pharmacotherapy Previous Bariatric surgery: n/a Pharmacotherapy for weight loss: She is currently taking compounded tirzepatide 7.5 mg for medical weight loss.    Tirzepatide 7.5 mg once weekly with reported good compliance and tolerance. She reports no constipation but one day eating cole slaw and thinking that it tasted funny and feeling nauseas the day after. This was the same day she took the shot but she thinks this is not side effects from the medication but from having bad cole slaw. She reports that the medication has been helpful. She reports that the dose will increase to 10 mg next week.   We discussed various medication options to help April [name] with her weight loss efforts and we both agreed to : Continue with current nutritional  and behavioral strategies   B) OBESITY RELATED CONDITIONS ADDRESSED TODAY:  Prediabetes Assessment & Plan Lab Results  Component Value Date   HGBA1C 5.6 08/24/2024   HGBA1C 5.9 05/24/2024   HGBA1C 5.8 11/20/2023   INSULIN  15.8 08/24/2024   INSULIN  11.8 09/17/2022   INSULIN  5.7 03/18/2022    Diet and life style controlled. She states that her hunger and cravings are well controlled. She has been focusing on getting her protein in. Extensive discussion with patient about variety of  vegetables and foods and how to eat them and protein bowls.Continue following prudent meal plan and decreasing simple carbs and sugars.       Essential hypertension Assessment & Plan BP Readings from Last 3 Encounters:  11/23/24 109/72  11/23/24 122/80  11/15/24 122/78   The 10-year ASCVD risk score (Arnett DK, et al., 2019) is: 0.7%  Lab Results  Component Value Date   CREATININE 0.69 08/24/2024   On Cozaar  50 mg daily. With reported good compliance and tolerance. BP today is at goal- well controlled. Reviewed with patient that as she sheds fat her BP with also decrease. Continue to follow meal plan and medications. Decrease foods high in sodium like frozen prepackaged meals.      Follow up:   Return 12/09/2024 at 3:00 PM  She was informed of the importance of frequent follow up visits to maximize her success with intensive lifestyle modifications for her multiple health conditions.   Weight Summary and Biometrics   Weight Lost Since Last Visit: 8lb  Weight Gained Since Last Visit: 0lb   Vitals Temp: 97.7 F (36.5 C) BP: 109/72 Pulse Rate: 78 SpO2: 100 %   Anthropometric Measurements Height: 5' 10 (1.778 m) Weight: 245 lb (111.1 kg) BMI (Calculated): 35.15 Weight at Last Visit: 253lb Weight Lost Since Last Visit: 8lb Weight Gained Since Last Visit: 0lb Starting Weight: 273lb Total Weight Loss (lbs): 28 lb (12.7 kg) Peak Weight: 273lb   Body Composition  Body Fat %: 44.1 % Fat Mass (lbs): 108.2 lbs Muscle Mass (lbs): 130.2 lbs Total Body Water (lbs): 95.6 lbs Visceral Fat Rating : 11   Other Clinical Data Fasting: no Labs: no Today's Visit #: 6 Starting Date: 08/24/24    Objective:   PHYSICAL EXAM: Blood pressure 109/72, pulse 78, temperature 97.7 F (36.5 C), height 5' 10 (1.778 m), weight 245 lb (111.1 kg), SpO2 100%. Body mass index is 35.15 kg/m.  General: she is overweight, cooperative and in no acute distress. PSYCH: Has normal mood,  affect and thought process.   HEENT: EOMI, sclerae are anicteric. Lungs: Normal breathing effort, no conversational dyspnea. Extremities: Moves * 4 Neurologic: A and O * 3, good insight  DIAGNOSTIC DATA REVIEWED: BMET    Component Value Date/Time   NA 138 08/24/2024 0936   K 4.5 08/24/2024 0936   CL 103 08/24/2024 0936   CO2 20 08/24/2024 0936   GLUCOSE 72 08/24/2024 0936   GLUCOSE 138 (H) 05/24/2024 1448   BUN 15 08/24/2024 0936   CREATININE 0.69 08/24/2024 0936   CREATININE 0.87 05/26/2020 0841   CALCIUM 9.9 08/24/2024 0936   GFRNONAA >60 12/15/2018 1402   GFRAA >60 12/15/2018 1402   Lab Results  Component Value Date   HGBA1C 5.6 08/24/2024   HGBA1C 5.8 04/21/2013   Lab Results  Component Value Date   INSULIN  15.8 08/24/2024   INSULIN  16.3 05/17/2021   Lab Results  Component Value Date   TSH 1.440 08/24/2024   CBC  Component Value Date/Time   WBC 7.2 08/24/2024 0936   WBC 7.8 05/24/2024 1448   RBC 4.58 08/24/2024 0936   RBC 4.25 05/24/2024 1448   HGB 13.7 08/24/2024 0936   HCT 43.8 08/24/2024 0936   PLT 375 08/24/2024 0936   MCV 96 08/24/2024 0936   MCH 29.9 08/24/2024 0936   MCH 29.2 05/26/2020 0841   MCHC 31.3 (L) 08/24/2024 0936   MCHC 34.0 05/24/2024 1448   RDW 13.0 08/24/2024 0936   Iron Studies    Component Value Date/Time   IRON 82 05/30/2021 0830   FERRITIN 290.9 05/30/2021 0830   IRONPCTSAT 22.4 09/18/2017 1110   Lipid Panel     Component Value Date/Time   CHOL 222 (H) 08/24/2024 0936   TRIG 185 (H) 08/24/2024 0936   HDL 72 08/24/2024 0936   CHOLHDL 3.1 08/24/2024 0936   CHOLHDL 3 11/20/2023 0856   VLDL 25.8 11/20/2023 0856   LDLCALC 118 (H) 08/24/2024 0936   LDLCALC 115 (H) 05/26/2020 0841   LDLDIRECT 96.0 05/26/2019 0835   Hepatic Function Panel     Component Value Date/Time   PROT 7.4 08/24/2024 0936   ALBUMIN 4.2 08/24/2024 0936   AST 18 08/24/2024 0936   ALT 18 08/24/2024 0936   ALKPHOS 75 08/24/2024 0936   BILITOT  0.4 08/24/2024 0936   BILIDIR 0.1 05/26/2020 0841   IBILI 0.2 05/26/2020 0841      Component Value Date/Time   TSH 1.440 08/24/2024 0936   Nutritional Lab Results  Component Value Date   VD25OH 53.6 08/24/2024   VD25OH 48.76 11/20/2023   VD25OH 57.5 09/17/2022    Attestations:   I, Sonny Laroche, acting as a stage manager for Barnie Jenkins, DO., have compiled all relevant documentation for today's office visit on behalf of Barnie Jenkins, DO, while in the presence of [employer], DO.  I have reviewed the above documentation for accuracy and completeness, and I agree with the above. Barnie JINNY Jenkins, D.O.  The 21st Century Cures Act was signed into law in 2016 which includes the topic of electronic health records.  This provides immediate access to information in MyChart.  This includes consultation notes, operative notes, office notes, lab results and pathology reports.  If you have any questions about what you read please let us  know at your next visit so we can discuss your concerns and take corrective action if need be.  We are right here with you.  "

## 2024-11-23 NOTE — Progress Notes (Signed)
 "   49 y.o. y.o. female here for annual exam. Working as a teacher, early years/pre Started compound terzepatide and has lost 30lbs. Working with wellness clinic  No LMP recorded. Patient is perimenopausal.    emale here for annual exam. No LMP recorded. (Menstrual status: Other).     HPI: Well on Nuvaring continuous use.  No BTB.  No pelvic pain.  Frequent sebaceous gland cysts/abcesses at vulva/upper inner legs and breast/axilla (Hidradenitis suppurativa) and Psoriasis followed by Dermato.  No pain with IC. Pap Neg 10/2020.  No previous abnormal Pap.  Will repeat Pap at 3 years.  Breasts wnl.  Screening mammo Neg 12/24birads 1.  BMI 33.05 last visit last visit. SABRA  Health labs with Fam MD.  Anxiety/Depression stable on Lexapro .  Colonoscopy 03/28/23 normal repeat in 10 years. Ct cardiac score test: would like to do and order palced RA and GM with osteoporosis and fractures: to get baseline dxa Referral to dermatology for mole body check. Has not had a skin check  Body mass index is 35.44 kg/m.     11/23/2024    9:37 AM 08/24/2024    8:27 AM 12/17/2023    9:23 AM  Depression screen PHQ 2/9  Decreased Interest 0 2 0  Down, Depressed, Hopeless 0 2 0  PHQ - 2 Score 0 4 0  Altered sleeping  2   Tired, decreased energy  3   Change in appetite  3   Feeling bad or failure about yourself   1   Trouble concentrating  1   Moving slowly or fidgety/restless  0   Suicidal thoughts  1   PHQ-9 Score  15    Difficult doing work/chores  Somewhat difficult      Data saved with a previous flowsheet row definition    Blood pressure 122/80, pulse 93, height 5' 10 (1.778 m), weight 247 lb (112 kg), SpO2 99%.     Component Value Date/Time   DIAGPAP  11/07/2023 1337    - Negative for intraepithelial lesion or malignancy (NILM)   HPVHIGH Negative 11/07/2023 1337   ADEQPAP  11/07/2023 1337    Satisfactory for evaluation; transformation zone component PRESENT.    GYN HISTORY:    Component Value Date/Time    DIAGPAP  11/07/2023 1337    - Negative for intraepithelial lesion or malignancy (NILM)   HPVHIGH Negative 11/07/2023 1337   ADEQPAP  11/07/2023 1337    Satisfactory for evaluation; transformation zone component PRESENT.    OB History  Gravida Para Term Preterm AB Living  0 0 0 0 0 0  SAB IAB Ectopic Multiple Live Births  0 0 0 0 0    Past Medical History:  Diagnosis Date   ALLERGIC RHINITIS    Allergies    Anal fissure    Anxiety    ANXIETY DEPRESSION    Back pain    Boils    CONSTIPATION    Constipation    DEPRESSION    Esophageal reflux    Food allergy    Avacados   Gastritis    GERD (gastroesophageal reflux disease)    HYPERCHOLESTEROLEMIA    Hyperlipidemia    Hypertension    Joint pain    OBESITY    Occipital neuralgia    Palpitations    Plantar fasciitis    Prediabetes    Psoriasis    Resting tremor    Rheumatoid arthritis (HCC) 12/24/2017   SOB (shortness of breath) on exertion    TMJ (dislocation of  temporomandibular joint)     Past Surgical History:  Procedure Laterality Date   NO PAST SURGERIES      Medications Ordered Prior to Encounter[1]  Social History   Socioeconomic History   Marital status: Married    Spouse name: Not on file   Number of children: 0   Years of education: Not on file   Highest education level: Professional school degree (e.g., MD, DDS, DVM, JD)  Occupational History   Occupation: Pharmacist  Tobacco Use   Smoking status: Never    Passive exposure: Never   Smokeless tobacco: Never   Tobacco comments:    Married, lives with spouse  Vaping Use   Vaping status: Never Used  Substance and Sexual Activity   Alcohol use: No   Drug use: No   Sexual activity: Yes    Partners: Male    Birth control/protection: Inserts    Comment: 1ST INTERCOURSE- 21, PARTNERS- 3   Other Topics Concern   Not on file  Social History Narrative   Not on file   Social Drivers of Health   Tobacco Use: Low Risk (11/23/2024)    Patient History    Smoking Tobacco Use: Never    Smokeless Tobacco Use: Never    Passive Exposure: Never  Financial Resource Strain: Low Risk (11/20/2023)   Overall Financial Resource Strain (CARDIA)    Difficulty of Paying Living Expenses: Not hard at all  Food Insecurity: No Food Insecurity (11/20/2023)   Hunger Vital Sign    Worried About Running Out of Food in the Last Year: Never true    Ran Out of Food in the Last Year: Never true  Transportation Needs: No Transportation Needs (11/20/2023)   PRAPARE - Administrator, Civil Service (Medical): No    Lack of Transportation (Non-Medical): No  Physical Activity: Unknown (11/20/2023)   Exercise Vital Sign    Days of Exercise per Week: 0 days    Minutes of Exercise per Session: Not on file  Stress: Stress Concern Present (11/20/2023)   [employer] of Occupational Health - Occupational Stress Questionnaire    Feeling of Stress : Rather much  Social Connections: Moderately Integrated (11/20/2023)   Social Connection and Isolation Panel    Frequency of Communication with Friends and Family: Once a week    Frequency of Social Gatherings with Friends and Family: Never    Attends Religious Services: More than 4 times per year    Active Member of [employer] or Organizations: Yes    Attends Banker Meetings: More than 4 times per year    Marital Status: Married  Catering Manager Violence: Not on file  Depression (PHQ2-9): Low Risk (11/23/2024)   Depression (PHQ2-9)    PHQ-2 Score: 0  Alcohol Screen: Not on file  Housing: Low Risk (11/20/2023)   Housing Stability Vital Sign    Unable to Pay for Housing in the Last Year: No    Number of Times Moved in the Last Year: 0    Homeless in the Last Year: No  Utilities: Not on file  Health Literacy: Not on file    Family History  Problem Relation Age of Onset   Depression Mother    Cancer Mother    Hypertension Mother    Diabetes Mother    Hyperlipidemia Mother    Breast  cancer Mother 74   Osteoarthritis Mother    Colon polyps Mother    Anxiety disorder Mother    Sleep apnea Mother  Eating disorder Mother    Obesity Mother    Arthritis Mother    Hearing loss Mother    Kidney disease Mother    Heart failure Father    Hyperlipidemia Father    Coronary artery disease Father        MI age 35   Prostate cancer Father    Colon polyps Father    Bladder Cancer Father    Cancer - Prostate Father    Macular degeneration Father    Cancer Father    COPD Father    Hearing loss Father    Heart disease Father    Vision loss Father    Hyperlipidemia Brother    Depression Brother    Hearing loss Brother    Diabetes Maternal Grandmother    Stomach cancer Paternal Grandmother    Colon cancer Neg Hx    Liver disease Neg Hx    Esophageal cancer Neg Hx      Allergies[2]    Patient's last menstrual period was No LMP recorded. Patient is perimenopausal..            Review of Systems Alls systems reviewed and are negative.     Physical Exam Constitutional:      Appearance: Normal appearance.  Genitourinary:     Vulva and urethral meatus normal.     No lesions in the vagina.     Right Labia: No rash, lesions or skin changes.    Left Labia: No lesions, skin changes or rash.    No vaginal discharge or tenderness.     No vaginal prolapse present.    No vaginal atrophy present.     Right Adnexa: not tender, not palpable and no mass present.    Left Adnexa: not tender, not palpable and no mass present.    No cervical motion tenderness or discharge.     Uterus is not enlarged, tender or irregular.  Breasts:    Right: Normal.     Left: Normal.  HENT:     Head: Normocephalic.  Neck:     Thyroid : No thyroid  mass, thyromegaly or thyroid  tenderness.  Cardiovascular:     Rate and Rhythm: Normal rate and regular rhythm.     Heart sounds: Normal heart sounds, S1 normal and S2 normal.  Pulmonary:     Effort: Pulmonary effort is normal.     Breath  sounds: Normal breath sounds and air entry.  Abdominal:     General: There is no distension.     Palpations: Abdomen is soft. There is no mass.     Tenderness: There is no abdominal tenderness. There is no guarding or rebound.  Musculoskeletal:        General: Normal range of motion.     Cervical back: Full passive range of motion without pain, normal range of motion and neck supple. No tenderness.     Right lower leg: No edema.     Left lower leg: No edema.  Neurological:     Mental Status: She is alert.  Skin:    General: Skin is warm.  Psychiatric:        Mood and Affect: Mood normal.        Behavior: Behavior normal.        Thought Content: Thought content normal.  Vitals and nursing note reviewed. Exam conducted with a chaperone present.       A:         Well Woman GYN exam  Yellow discharge Recurrent yeast infections                P:        Pap smear not indicated Encouraged annual mammogram screening Colon cancer screening up-to-date DXA ordered today Labs and immunizations to do with PMD Discussed breast self exams Encouraged healthy lifestyle practices Encouraged Vit D and Calcium  Diflucan  sent to the pharmacy for recurrent yeast infections  No follow-ups on file.  April [name]      [1]  Current Outpatient Medications on File Prior to Visit  Medication Sig Dispense Refill   albuterol  (VENTOLIN  HFA) 108 (90 Base) MCG/ACT inhaler Inhale 2 puffs into the lungs every 6 (six) hours as needed for wheezing or shortness of breath. 6.7 g 0   ascorbic acid (VITAMIN C) 1000 MG tablet Take 1,000 mg by mouth daily.     Bacillus Coagulans-Inulin (PROBIOTIC) 1-250 BILLION-MG CAPS      Brexpiprazole  (REXULTI ) 0.5 MG TABS Take 1 tablet (0.5 mg total) by mouth daily. 90 tablet 0   busPIRone  (BUSPAR ) 15 MG tablet Take 1 tablet (15 mg total) by mouth 2 (two) times daily. 180 tablet 1   celecoxib  (CELEBREX ) 200 MG capsule Take 1 capsule (200 mg total) by  mouth 2 (two) times daily as needed with food 180 capsule 1   certolizumab pegol  (CIMZIA ) 200 MG/ML prefilled syringe Inject 200 mg into the skin every 14 (fourteen) days. 2 mL 5   cetirizine  (ZYRTEC ) 10 MG tablet Take 1 tablet (10 mg total) by mouth daily. 30 tablet 3   Cholecalciferol (VITAMIN D ) 50 MCG (2000 UT) CAPS Take 1 capsule by mouth daily.     clobetasol  ointment (TEMOVATE ) 0.05 % as needed.  0   escitalopram  (LEXAPRO ) 20 MG tablet Take 1 tablet (20 mg total) by mouth daily. NEEDS APPOINTMENT FOR REFILLS 90 tablet 0   esomeprazole  (NEXIUM ) 40 MG capsule Take 1 capsule (40 mg total) by mouth daily. 90 capsule 1   famotidine  (PEPCID ) 20 MG tablet Take 20 mg by mouth at bedtime. Take 1 tablets by mouth daily at bedtime     fenofibrate  160 MG tablet Take 1 tablet (160 mg total) by mouth daily. 90 tablet 0   folic acid  (FOLVITE ) 1 MG tablet Take 1 tablet (1 mg total) by mouth daily. 90 tablet 3   ibuprofen  (ADVIL ) 800 MG tablet Take 1 tablet (800 mg total) by mouth every 8 (eight) hours as needed. 90 tablet 0   losartan  (COZAAR ) 50 MG tablet Take 1 tablet (50 mg total) by mouth daily. 90 tablet 0   MAGNESIUM  MALATE PO Take 1 tablet by mouth at bedtime.     methotrexate  50 MG/2ML injection Inject 1 mL (25 mg total) into the skin once a week. 12 mL 1   methotrexate  50 MG/2ML injection Inject 1 mL (25 mg total) into the skin once a week. 12 mL 0   montelukast  (SINGULAIR ) 10 MG tablet Take 1 tablet (10 mg total) by mouth at bedtime. 90 tablet 1   Multiple Vitamin (MULTIVITAMIN) tablet Take 1 tablet by mouth daily.     mupirocin ointment (BACTROBAN) 2 % Apply 1 Application topically 2 (two) times daily as needed.     omega-3 acid ethyl esters (LOVAZA ) 1 g capsule Take 2 capsules (2 g total) by mouth 2 (two) times daily. 360 capsule 1   TIRZEPATIDE [place] Inject 2.5 mg into the skin once a week.     zolpidem  (AMBIEN ) 5  MG tablet Take 1 tablet (5 mg total) by mouth at bedtime as needed for sleep. 90  tablet 0   Vonoprazan Fumarate  (VOQUEZNA ) 10 MG TABS Take 1 tablet by mouth daily. (Patient not taking: Reported on 11/23/2024) 90 tablet 1   No current facility-administered medications on file prior to visit.  [2]  Allergies Allergen Reactions   Ciprofloxacin     toxicity   Levaquin  [Levofloxacin ] Anxiety    Possible CNS side effects. TOXICITY   Moxifloxacin     TOXICITY   Avocado     Other reaction(s): Unknown   "

## 2024-11-24 ENCOUNTER — Other Ambulatory Visit: Payer: Self-pay

## 2024-11-24 ENCOUNTER — Other Ambulatory Visit (HOSPITAL_COMMUNITY): Payer: Self-pay

## 2024-11-24 ENCOUNTER — Other Ambulatory Visit: Payer: Self-pay | Admitting: Pharmacy Technician

## 2024-11-24 NOTE — Progress Notes (Signed)
 Copay card now on file. Test claim shows $0 copay.

## 2024-11-25 ENCOUNTER — Other Ambulatory Visit (HOSPITAL_COMMUNITY): Payer: Self-pay

## 2024-11-25 ENCOUNTER — Ambulatory Visit: Payer: Self-pay | Admitting: Obstetrics and Gynecology

## 2024-11-25 LAB — SURESWAB® ADVANCED VAGINITIS PLUS,TMA
C. trachomatis RNA, TMA: NOT DETECTED
CANDIDA SPECIES: NOT DETECTED
Candida glabrata: NOT DETECTED
N. gonorrhoeae RNA, TMA: NOT DETECTED
SURESWAB(R) ADV BACTERIAL VAGINOSIS(BV),TMA: NEGATIVE
TRICHOMONAS VAGINALIS (TV),TMA: NOT DETECTED

## 2024-11-29 ENCOUNTER — Other Ambulatory Visit (HOSPITAL_COMMUNITY): Payer: Self-pay

## 2024-11-29 NOTE — Progress Notes (Signed)
 Specialty Pharmacy Refill Coordination Note  MyChart Questionnaire Submission  April [name] is a 49 y.o. female contacted today regarding refills of specialty medication(s) Cimzia .  Doses on hand: (Patient-Rptd) 1   Next inj: 12/14/23  Patient requested: (Patient-Rptd) Delivery   Delivery date: 12/07/24  Verified address: [address] [place] [address]  Medication will be filled on 12/06/24

## 2024-12-02 ENCOUNTER — Other Ambulatory Visit (HOSPITAL_COMMUNITY): Payer: Self-pay

## 2024-12-03 ENCOUNTER — Ambulatory Visit (HOSPITAL_BASED_OUTPATIENT_CLINIC_OR_DEPARTMENT_OTHER)
Admission: RE | Admit: 2024-12-03 | Discharge: 2024-12-03 | Disposition: A | Discharge status: Home / Self Care | Payer: Self-pay | Source: Ambulatory Visit | Attending: Obstetrics and Gynecology | Admitting: Obstetrics and Gynecology

## 2024-12-03 DIAGNOSIS — Z8249 Family history of ischemic heart disease and other diseases of the circulatory system: Secondary | ICD-10-CM | POA: Insufficient documentation

## 2024-12-05 ENCOUNTER — Other Ambulatory Visit: Payer: Self-pay | Admitting: Internal Medicine

## 2024-12-05 DIAGNOSIS — F331 Major depressive disorder, recurrent, moderate: Secondary | ICD-10-CM

## 2024-12-06 ENCOUNTER — Other Ambulatory Visit: Payer: Self-pay

## 2024-12-06 ENCOUNTER — Other Ambulatory Visit (HOSPITAL_COMMUNITY): Payer: Self-pay

## 2024-12-06 MED ORDER — REXULTI 0.5 MG PO TABS
1.0000 | ORAL_TABLET | Freq: Every day | ORAL | 0 refills | Status: AC
  Filled 2024-12-06: qty 90, 90d supply, fill #0

## 2024-12-09 ENCOUNTER — Ambulatory Visit (INDEPENDENT_AMBULATORY_CARE_PROVIDER_SITE_OTHER): Admitting: Family Medicine

## 2024-12-09 ENCOUNTER — Encounter (INDEPENDENT_AMBULATORY_CARE_PROVIDER_SITE_OTHER): Payer: Self-pay | Admitting: Family Medicine

## 2024-12-09 VITALS — BP 114/69 | HR 77 | Temp 97.7°F | Ht 70.0 in | Wt 242.0 lb

## 2024-12-09 DIAGNOSIS — E66812 Obesity, class 2: Secondary | ICD-10-CM

## 2024-12-09 DIAGNOSIS — Z6836 Body mass index (BMI) 36.0-36.9, adult: Secondary | ICD-10-CM

## 2024-12-09 DIAGNOSIS — R7303 Prediabetes: Secondary | ICD-10-CM | POA: Diagnosis not present

## 2024-12-09 DIAGNOSIS — Z6834 Body mass index (BMI) 34.0-34.9, adult: Secondary | ICD-10-CM | POA: Diagnosis not present

## 2024-12-09 DIAGNOSIS — E559 Vitamin D deficiency, unspecified: Secondary | ICD-10-CM

## 2024-12-09 NOTE — Progress Notes (Unsigned)
 "  April [name], D.O.  ABFM, ABOM Specializing in Clinical Bariatric Medicine  Office located at: [address] [place], [place]  [address]        FOR THE CHRONIC DISEASE OF OBESITY:  Chief complaint: Obesity April [name] is here to discuss her progress with her obesity treatment plan.   History of present illness / Interval history:  April [name] is here today for her follow-up office visit.  Since last OV on 11/24/23 with provider: Dr.O, patient is struggling to get all her protein in.   Pt has been struggling with:  [x]  meal planning and prepping []  exercise []  sleep []  stressors []  mood []  chronic or acute medical conditions-  []  eating out more []  Nothing, doing great  Pt has been working on and improving their:  [x]  meal planning and prepping []  exercise []  sleep hygiene []  water intake []  strategies to better manage personal stressors []  strategies to better manage mood []  eating out less []  Nothing particular at this time  When asked by CMA prior to our office visit today, pt states they have been:  - Focused on eating fresh, unprocessed foods?  Yes   - Focused on eating lean proteins with each meal?  Yes   - Sleeping 7-9 Hours/ Night?  Yes   - Skipping Meals?  No   - States she is following her healthy eating plan approximately 50 % of the time.   Recent weight loss data history   11/23/24 12/09/24 14:00   Body Fat % 44.1 % 44.7 %  Muscle Mass (lbs) 130.2 lbs 127.4 lbs  Fat Mass (lbs) 108.2 lbs 108.6 lbs  Total Body Water (lbs) 95.6 lbs 97.8 lbs  Visceral Fat Rating  11 11     Total lbs lost to date: - 31 lbs Total Fat Mass in lbs lost to date: - 2.4 lbs Total weight loss percentage to date since starting program:  -11.36 %    Class 2 obesity with (BMI) of 39.0 to 39.9 start BMI 39.17 BMI 36.0-36.9,adult - current BMI 34.72  Physician directed Nutrition Therapy prescription: She is on keeping a food journal and adhering to recommended  goals of 1500-1600 calories and 110+ protein.    April [name] is currently in the action stage of change. As such, her goal is to continue journaling.  Detailed, physician-directed nutritional counseling was provided, including: [] Discussed meal prep companies. Ie. Longlife, Factor Meals, Purple carrot, Hungryroot etc [x] Discussed strategies for meal planning and prepping at home [x] Discussed supermarket healthy prepared meal options- ie. April [name]'s natural meals or [employer] Prepared meals   She has agreed to: continue current reduced-calorie meal plan   Physician directed Behavioral Modification prescription: Evidence-based interventions for healthy behavior change were utilized today including the discussion of small changes and SMART goals.  We discussed the following today: increasing lean protein intake to established goals, work on meal planning and preparation, planning for success, better snacking choices, and focusing on food with a 10:1 ratio of calories: grams of protein, look into high protein pastas( [employer]izen, Petes)    Personalized instruction on the use of [employer] app to quickly assess nutritional value of prepackaged foods.   Physician directed Physical Activity prescription:  April [name]  is on the treadmill/videos 30-60  minutes 3-4 days per week   April [name] has been educated on how muscle tissues will burn more calories than adipose tissues, so maintaining or building more muscle will help keep metabolism elevated and helps support sustainable weight  loss   and how exercise can improve and help manage stress, thus leading to less emotional eating and less poor food choices  Exercise prescription: She has agreed to Continue current level of physical activity     Medical Interventions/ Pharmacotherapy  Pharmacotherapy for weight loss: She is currently taking compounded Tirzepatide 10 mg weekly from outside facility for medical weight loss.    On compounded tirzepatide 10 mg weekly  with reported good compliance and tolerance. Patient states that she has not noticed a difference in her hunger and cravings. She has noticed it has quieted her food noise. Patient reports that her clothes are fitting her differently. She has had some nausea on the new dose and some stomach pain and general feeling not well in her stomach. Reminded patient that if she does not on plan that this can cause GI upset.   Discussed with patient possible other options like Topiramate and Metformin  in addition to help with hunger/cravings.  Reviewed efficacy of medication with patient.  Continue with current medication and following prudent meal plan. Pt wishes to focus on increasing her protein intake until next OV for now and desires to hold off on any additional medications.  Will reassess at next OV.    We discussed various medication options to help April [name] with her weight loss efforts and we both agreed to:  Adequate clinical response to anti-obesity medication, continue current anti-obesity regimen  SPECIFIC behavorial / nutritional / exercise goals for next office visit:   Packing lunch to achieve protein goals at work. Seek out higher protein snacks   B) OBESITY RELATED CONDITIONS ADDRESSED TODAY:  Prediabetes Assessment & Plan Lab Results  Component Value Date   HGBA1C 5.6 08/24/2024   HGBA1C 5.9 05/24/2024   HGBA1C 5.8 11/20/2023   INSULIN  15.8 08/24/2024   INSULIN  11.8 09/17/2022   INSULIN  5.7 03/18/2022    Prediabetes Condition stable, on clinical weight management program, losing 10 % of body weight may improve condition, continue clinical weight loss program. Nutritional guidance provided to patient today.     Vitamin D  deficiency Assessment & Plan Lab Results  Component Value Date   VD25OH 53.6 08/24/2024   VD25OH 48.76 11/20/2023   VD25OH 57.5 09/17/2022   Taking Cholecalciferol 50 mcg daily with reported good compliance and tolerance. Reminded patient the importance of  having at goal Vit D levels and the importance of Vit D. Continue with supplementation.     Follow up:   Return 01/05/2025 at 9:40 AM   She was informed of the importance of frequent follow up visits to maximize her success with intensive lifestyle modifications for her multiple health conditions.   Weight Summary and Biometrics   Weight Lost Since Last Visit: 3 lb  Weight Gained Since Last Visit: 0  Vitals Temp: 97.7 F (36.5 C) BP: 114/69 Pulse Rate: 77 SpO2: 98 %   Anthropometric Measurements Height: 5' 10 (1.778 m) Weight at Last Visit: 245 lb Starting Weight: 273 lb Peak Weight: 273 lb   Body Composition  Body Fat %: 44.7 % Fat Mass (lbs): 108.6 lbs Muscle Mass (lbs): 127.4 lbs Total Body Water (lbs): 97.8 lbs Visceral Fat Rating : 11   Other Clinical Data Fasting: No Labs: No Today's Visit #: 7 Starting Date: 08/24/24    Objective:   PHYSICAL EXAM: Height 5' 10 (1.778 m). Body mass index is 35.15 kg/m. General: she is overweight, cooperative and in no acute distress. PSYCH: Has normal mood, affect and thought process.  HEENT: EOMI, sclerae are anicteric. Lungs: Normal breathing effort, no conversational dyspnea. Extremities: Moves * 4 Neurologic: A and O * 3, good insight  DIAGNOSTIC DATA REVIEWED: BMET    Component Value Date/Time   NA 138 08/24/2024 0936   K 4.5 08/24/2024 0936   CL 103 08/24/2024 0936   CO2 20 08/24/2024 0936   GLUCOSE 72 08/24/2024 0936   GLUCOSE 138 (H) 05/24/2024 1448   BUN 15 08/24/2024 0936   CREATININE 0.69 08/24/2024 0936   CREATININE 0.87 05/26/2020 0841   CALCIUM 9.9 08/24/2024 0936   GFRNONAA >60 12/15/2018 1402   GFRAA >60 12/15/2018 1402   Lab Results  Component Value Date   HGBA1C 5.6 08/24/2024   HGBA1C 5.8 04/21/2013   Lab Results  Component Value Date   INSULIN  15.8 08/24/2024   INSULIN  16.3 05/17/2021   Lab Results  Component Value Date   TSH 1.440 08/24/2024   CBC    Component  Value Date/Time   WBC 7.2 08/24/2024 0936   WBC 7.8 05/24/2024 1448   RBC 4.58 08/24/2024 0936   RBC 4.25 05/24/2024 1448   HGB 13.7 08/24/2024 0936   HCT 43.8 08/24/2024 0936   PLT 375 08/24/2024 0936   MCV 96 08/24/2024 0936   MCH 29.9 08/24/2024 0936   MCH 29.2 05/26/2020 0841   MCHC 31.3 (L) 08/24/2024 0936   MCHC 34.0 05/24/2024 1448   RDW 13.0 08/24/2024 0936   Iron Studies    Component Value Date/Time   IRON 82 05/30/2021 0830   FERRITIN 290.9 05/30/2021 0830   IRONPCTSAT 22.4 09/18/2017 1110   Lipid Panel     Component Value Date/Time   CHOL 222 (H) 08/24/2024 0936   TRIG 185 (H) 08/24/2024 0936   HDL 72 08/24/2024 0936   CHOLHDL 3.1 08/24/2024 0936   CHOLHDL 3 11/20/2023 0856   VLDL 25.8 11/20/2023 0856   LDLCALC 118 (H) 08/24/2024 0936   LDLCALC 115 (H) 05/26/2020 0841   LDLDIRECT 96.0 05/26/2019 0835   Hepatic Function Panel     Component Value Date/Time   PROT 7.4 08/24/2024 0936   ALBUMIN 4.2 08/24/2024 0936   AST 18 08/24/2024 0936   ALT 18 08/24/2024 0936   ALKPHOS 75 08/24/2024 0936   BILITOT 0.4 08/24/2024 0936   BILIDIR 0.1 05/26/2020 0841   IBILI 0.2 05/26/2020 0841      Component Value Date/Time   TSH 1.440 08/24/2024 0936   Nutritional Lab Results  Component Value Date   VD25OH 53.6 08/24/2024   VD25OH 48.76 11/20/2023   VD25OH 57.5 09/17/2022    Attestations:   I, Sonny Laroche, acting as a stage manager for April Jenkins, DO., have compiled all relevant documentation for today's office visit on behalf of April Jenkins, DO, while in the presence of [employer], DO.   I have spent 43 minutes in the care of the patient including:   -   2 minutes before the visit reviewing and preparing the chart.   -   33 minutes face-to-face assessing and reviewing listed medical problems as outlined in obesity care plan, providing nutritional and behavioral counseling on topics outlined in the obesity care plan, independently  interpreting test results and goals of care as described in assessment and plan, and reviewing and discussing biometric information and progress with obesity treatment plan  -   8 minutes after the visit regarding the EMR documentation of encounter.   I have reviewed the above documentation for accuracy and completeness, and I  agree with the above. April JINNY [name], D.O.  The 21st Century Cures Act was signed into law in 2016 which includes the topic of electronic health records.  This provides immediate access to information in MyChart.  This includes consultation notes, operative notes, office notes, lab results and pathology reports.  If you have any questions about what you read please let us  know at your next visit so we can discuss your concerns and take corrective action if need be.  We are right here with you.  "

## 2024-12-09 NOTE — Progress Notes (Incomplete)
 "  April DOROTHA [name], D.O.  ABFM, ABOM Specializing in Clinical Bariatric Medicine  Office located at: [address] [place], [place]  [address]        No orders of the defined types were placed in this encounter.   There are no discontinued medications.   No orders of the defined types were placed in this encounter.     FOR THE CHRONIC DISEASE OF OBESITY:  Chief complaint: Obesity April [name] is here to discuss her progress with her obesity treatment plan.   History of present illness / Interval history:  April [name] is here today for her follow-up office visit.  Since last OV on *** with provider: ***, patient ***.  Recent challenges/ barriers to successful adherence of program recommendations:  *** {do_diet_adherence:34548}  Pt has been struggling with:  []  meal planning and prepping []  exercise []  sleep []  stressors []  mood []  chronic or acute medical conditions-  []  eating out more []  Nothing, doing great  Pt has been working on and improving their:  []  meal planning and prepping []  exercise []  sleep hygiene []  water intake []  strategies to better manage personal stressors []  strategies to better manage mood []  eating out less []  Nothing particular at this time  When asked by CMA prior to our office visit today, pt states they have been:  - Focused on eating fresh, unprocessed foods?  Yes  *** - Focused on eating lean proteins with each meal?  Yes  *** - Sleeping 7-9 Hours/ Night?  Yes  *** - Skipping Meals?  No  *** - States she is following her healthy eating plan approximately *** % of the time.   Recent weight loss data history *** copy/ past synopsis data ( last ov versus this ov)  (( Only add below if we discussed strip data ))   Counseling done on how various foods/ behaviors will affect these numbers and how to maximize weight loss success discussed today in detail based on these findings. Total lbs lost to date: *** lbs Total Fat  Mass in lbs lost to date: *** lbs Total weight loss percentage to date since starting program:  *** %    Physician directed Nutrition Therapy prescription: She is on {MWMwtlossportion/plan2:23431}.    April [name] is currently in the action stage of change. As such, her goal is to ***  Detailed, physician-directed nutritional counseling was provided, including: [] Discussed meal prep companies. Ie. Longlife, [employer] Meals, Purple carrot, Hung[employer]root etc [] Discussed strategies for meal planning and prepping at home [] Discussed supermarket healthy prepared meal options- ie. [employer] natural meals or [employer] Prepared meals    She has agreed to: {EMWTLOSSPLAN:29297::continue current reduced-calorie meal plan}    Physician directed Behavioral Modification prescription: Evidence-based interventions for healthy behavior change were utilized today including the discussion of small changes and SMART goals. barriers to successful adherence to behavorial change for wt loss: {do_barriers_behavorial_change:34585}  We discussed the following today: {dowtlossstrategies:31654}   {do_additional_resources:34541}   Physician directed Physical Activity prescription: {DOEXERCISE:33460} barriers to successful adherence to exercise for wt loss: {do_barriers_to_ex:34584::transportation issues}  April [name] has been educated on {do_exercise_prescription:34542}  Exercise prescription: She has agreed to {do_exercise_goals:34583}    Medical Interventions/ Pharmacotherapy Previous Bariatric surgery: {dopreviousbariatricsurgery:34538}  Previously tried medical weight loss medications/ therapies: {do_previous_drug_therapy:34544}  Pharmacotherapy for weight loss: She {srtis (Optional):29129} currently taking {srtpreviousweightlossmeds (Optional):29124} for medical weight loss.    ( (Only keep if we add med or, pt is on a wt loss med) ) We discussed various  medication options to help Comfort with her weight  loss efforts and we both agreed to:  {doAOMdrug_tx:34543}  SPECIFIC behavorial / nutritional / exercise goals for next office visit:  {DO_specific_goals:34582}  B) OBESITY RELATED CONDITIONS ADDRESSED TODAY:  There are no diagnoses linked to this encounter.  {do_course:29402}  {do_assess_HPI:34549}  {do_plan_txmnt plan_orders:34550}   Try to see is this is helpful {do_medical_conditions_A/P:34586}   Follow up:   No follow-ups on file. 01/05/2025 She was informed of the importance of frequent follow up visits to maximize her success with intensive lifestyle modifications for her multiple health conditions.  *** Labs obtained today and will be discussed/ addressed at their next office visit unless critical medical findings arise, then we will contact via Mychart or phone call.  Weight Summary and Biometrics   No data recorded No data recorded ***  No data recorded No data recorded No data recorded No data recorded  Objective:   PHYSICAL EXAM: There were no vitals taken for this visit. There is no height or weight on file to calculate BMI. General: she is overweight, cooperative and in no acute distress. PSYCH: Has normal mood, affect and thought process.   HEENT: EOMI, sclerae are anicteric. Lungs: Normal breathing effort, no conversational dyspnea. Extremities: Moves * 4 Neurologic: A and O * 3, good insight  DIAGNOSTIC DATA REVIEWED: BMET    Component Value Date/Time   NA 138 08/24/2024 0936   K 4.5 08/24/2024 0936   CL 103 08/24/2024 0936   CO2 20 08/24/2024 0936   GLUCOSE 72 08/24/2024 0936   GLUCOSE 138 (H) 05/24/2024 1448   BUN 15 08/24/2024 0936   CREATININE 0.69 08/24/2024 0936   CREATININE 0.87 05/26/2020 0841   CALCIUM 9.9 08/24/2024 0936   GFRNONAA >60 12/15/2018 1402   GFRAA >60 12/15/2018 1402   Lab Results  Component Value Date   HGBA1C 5.6 08/24/2024   HGBA1C 5.8 04/21/2013   Lab Results  Component Value Date   INSULIN  15.8  08/24/2024   INSULIN  16.3 05/17/2021   Lab Results  Component Value Date   TSH 1.440 08/24/2024   CBC    Component Value Date/Time   WBC 7.2 08/24/2024 0936   WBC 7.8 05/24/2024 1448   RBC 4.58 08/24/2024 0936   RBC 4.25 05/24/2024 1448   HGB 13.7 08/24/2024 0936   HCT 43.8 08/24/2024 0936   PLT 375 08/24/2024 0936   MCV 96 08/24/2024 0936   MCH 29.9 08/24/2024 0936   MCH 29.2 05/26/2020 0841   MCHC 31.3 (L) 08/24/2024 0936   MCHC 34.0 05/24/2024 1448   RDW 13.0 08/24/2024 0936   Iron Studies    Component Value Date/Time   IRON 82 05/30/2021 0830   FERRITIN 290.9 05/30/2021 0830   IRONPCTSAT 22.4 09/18/2017 1110   Lipid Panel     Component Value Date/Time   CHOL 222 (H) 08/24/2024 0936   TRIG 185 (H) 08/24/2024 0936   HDL 72 08/24/2024 0936   CHOLHDL 3.1 08/24/2024 0936   CHOLHDL 3 11/20/2023 0856   VLDL 25.8 11/20/2023 0856   LDLCALC 118 (H) 08/24/2024 0936   LDLCALC 115 (H) 05/26/2020 0841   LDLDIRECT 96.0 05/26/2019 0835   Hepatic Function Panel     Component Value Date/Time   PROT 7.4 08/24/2024 0936   ALBUMIN 4.2 08/24/2024 0936   AST 18 08/24/2024 0936   ALT 18 08/24/2024 0936   ALKPHOS 75 08/24/2024 0936   BILITOT 0.4 08/24/2024 0936   BILIDIR 0.1 05/26/2020 0841  IBILI 0.2 05/26/2020 0841      Component Value Date/Time   TSH 1.440 08/24/2024 0936   Nutritional Lab Results  Component Value Date   VD25OH 53.6 08/24/2024   VD25OH 48.76 11/20/2023   VD25OH 57.5 09/17/2022    Attestations:   I, Damien Blanks, acting as a stage manager for April Jenkins, DO., have compiled all relevant documentation for today's office visit on behalf of April Jenkins, DO, while in the presence of [employer], DO.  Only keep if time based coding I have spent *** minutes in the care of the patient including:   -   *** minutes before the visit reviewing and preparing the chart.   -   *** minutes face-to-face assessing and reviewing listed medical  problems as outlined in obesity care plan, providing nutritional and behavioral counseling on topics outlined in the obesity care plan, independently interpreting test results and goals of care as described in assessment and plan, and reviewing and discussing biometric information and progress with obesity treatment plan  -   *** minutes after the visit regarding the EMR documentation of encounter.   I have reviewed the above documentation for accuracy and completeness, and I agree with the above. April [name], D.O.  The 21st Century Cures Act was signed into law in 2016 which includes the topic of electronic health records.  This provides immediate access to information in MyChart.  This includes consultation notes, operative notes, office notes, lab results and pathology reports.  If you have any questions about what you read please let us  know at your next visit so we can discuss your concerns and take corrective action if need be.  We are right here with you. "

## 2024-12-21 LAB — HM MAMMOGRAPHY

## 2024-12-24 ENCOUNTER — Ambulatory Visit: Payer: Self-pay | Admitting: Obstetrics and Gynecology

## 2024-12-29 ENCOUNTER — Ambulatory Visit (INDEPENDENT_AMBULATORY_CARE_PROVIDER_SITE_OTHER): Admitting: Family Medicine

## 2024-12-30 ENCOUNTER — Ambulatory Visit: Admitting: Family Medicine

## 2025-01-05 ENCOUNTER — Ambulatory Visit (INDEPENDENT_AMBULATORY_CARE_PROVIDER_SITE_OTHER): Admitting: Family Medicine

## 2025-01-26 ENCOUNTER — Ambulatory Visit (INDEPENDENT_AMBULATORY_CARE_PROVIDER_SITE_OTHER): Admitting: Family Medicine

## 2025-08-02 ENCOUNTER — Ambulatory Visit: Admitting: Physician Assistant

## 2025-11-29 ENCOUNTER — Ambulatory Visit: Admitting: Obstetrics and Gynecology
